# Patient Record
Sex: Female | Born: 1965
Health system: Southern US, Community
[De-identification: ages and names within clinical notes are randomized; demographics above are authoritative.]

## PROBLEM LIST (undated history)

## (undated) DIAGNOSIS — M25512 Pain in left shoulder: Secondary | ICD-10-CM

## (undated) DIAGNOSIS — M1990 Unspecified osteoarthritis, unspecified site: Secondary | ICD-10-CM

## (undated) DIAGNOSIS — E079 Disorder of thyroid, unspecified: Secondary | ICD-10-CM

## (undated) DIAGNOSIS — G4730 Sleep apnea, unspecified: Secondary | ICD-10-CM

## (undated) DIAGNOSIS — J449 Chronic obstructive pulmonary disease, unspecified: Secondary | ICD-10-CM

## (undated) DIAGNOSIS — I10 Essential (primary) hypertension: Secondary | ICD-10-CM

## (undated) DIAGNOSIS — K649 Unspecified hemorrhoids: Secondary | ICD-10-CM

## (undated) DIAGNOSIS — E039 Hypothyroidism, unspecified: Secondary | ICD-10-CM

## (undated) DIAGNOSIS — M25511 Pain in right shoulder: Secondary | ICD-10-CM

## (undated) DIAGNOSIS — Z780 Asymptomatic menopausal state: Secondary | ICD-10-CM

## (undated) DIAGNOSIS — N62 Hypertrophy of breast: Secondary | ICD-10-CM

## (undated) DIAGNOSIS — M549 Dorsalgia, unspecified: Secondary | ICD-10-CM

## (undated) DIAGNOSIS — R195 Other fecal abnormalities: Secondary | ICD-10-CM

## (undated) HISTORY — DX: Dorsalgia, unspecified: M54.9

## (undated) HISTORY — DX: Other fecal abnormalities: R19.5

## (undated) HISTORY — PX: NASAL SINUS SURGERY: SHX719

## (undated) HISTORY — DX: Asymptomatic menopausal state: Z78.0

## (undated) HISTORY — DX: Pain in right shoulder: M25.511

## (undated) HISTORY — PX: ROTATOR CUFF REPAIR: SHX139

## (undated) HISTORY — PX: JOINT REPLACEMENT: SHX530

## (undated) HISTORY — DX: Pain in left shoulder: M25.512

## (undated) HISTORY — DX: Hypertrophy of breast: N62

## (undated) HISTORY — DX: Unspecified hemorrhoids: K64.9

---

## 2001-01-21 ENCOUNTER — Encounter: Payer: Self-pay | Admitting: Otolaryngology

## 2001-01-21 ENCOUNTER — Ambulatory Visit (HOSPITAL_COMMUNITY): Admission: RE | Admit: 2001-01-21 | Discharge: 2001-01-21 | Payer: Self-pay | Admitting: Otolaryngology

## 2001-03-01 ENCOUNTER — Encounter: Payer: Self-pay | Admitting: Otolaryngology

## 2001-03-01 ENCOUNTER — Ambulatory Visit (HOSPITAL_COMMUNITY): Admission: RE | Admit: 2001-03-01 | Discharge: 2001-03-01 | Payer: Self-pay | Admitting: Family Medicine

## 2001-04-12 ENCOUNTER — Encounter (INDEPENDENT_AMBULATORY_CARE_PROVIDER_SITE_OTHER): Payer: Self-pay | Admitting: *Deleted

## 2001-04-12 ENCOUNTER — Ambulatory Visit (HOSPITAL_BASED_OUTPATIENT_CLINIC_OR_DEPARTMENT_OTHER): Admission: RE | Admit: 2001-04-12 | Discharge: 2001-04-12 | Payer: Self-pay | Admitting: Otolaryngology

## 2005-04-27 ENCOUNTER — Emergency Department (HOSPITAL_COMMUNITY): Admission: EM | Admit: 2005-04-27 | Discharge: 2005-04-27 | Payer: Self-pay | Admitting: Emergency Medicine

## 2005-07-16 ENCOUNTER — Emergency Department (HOSPITAL_COMMUNITY): Admission: EM | Admit: 2005-07-16 | Discharge: 2005-07-16 | Payer: Self-pay | Admitting: Emergency Medicine

## 2005-11-06 ENCOUNTER — Emergency Department (HOSPITAL_COMMUNITY): Admission: EM | Admit: 2005-11-06 | Discharge: 2005-11-06 | Payer: Self-pay | Admitting: Emergency Medicine

## 2005-12-16 ENCOUNTER — Emergency Department (HOSPITAL_COMMUNITY): Admission: EM | Admit: 2005-12-16 | Discharge: 2005-12-16 | Payer: Self-pay | Admitting: Emergency Medicine

## 2006-01-16 ENCOUNTER — Ambulatory Visit (HOSPITAL_COMMUNITY): Admission: RE | Admit: 2006-01-16 | Discharge: 2006-01-16 | Payer: Self-pay | Admitting: Family Medicine

## 2006-04-01 ENCOUNTER — Ambulatory Visit (HOSPITAL_COMMUNITY): Admission: RE | Admit: 2006-04-01 | Discharge: 2006-04-01 | Payer: Self-pay | Admitting: Family Medicine

## 2006-04-06 ENCOUNTER — Ambulatory Visit (HOSPITAL_COMMUNITY): Admission: RE | Admit: 2006-04-06 | Discharge: 2006-04-06 | Payer: Self-pay | Admitting: Family Medicine

## 2006-05-12 ENCOUNTER — Emergency Department (HOSPITAL_COMMUNITY): Admission: EM | Admit: 2006-05-12 | Discharge: 2006-05-12 | Payer: Self-pay | Admitting: Emergency Medicine

## 2006-07-14 ENCOUNTER — Emergency Department (HOSPITAL_COMMUNITY): Admission: EM | Admit: 2006-07-14 | Discharge: 2006-07-14 | Payer: Self-pay | Admitting: Emergency Medicine

## 2006-10-16 ENCOUNTER — Encounter (HOSPITAL_COMMUNITY): Admission: RE | Admit: 2006-10-16 | Discharge: 2006-11-15 | Payer: Self-pay | Admitting: Family Medicine

## 2006-12-17 ENCOUNTER — Ambulatory Visit: Payer: Self-pay | Admitting: Gastroenterology

## 2006-12-22 ENCOUNTER — Ambulatory Visit: Payer: Self-pay | Admitting: Gastroenterology

## 2006-12-22 ENCOUNTER — Ambulatory Visit (HOSPITAL_COMMUNITY): Admission: RE | Admit: 2006-12-22 | Discharge: 2006-12-22 | Payer: Self-pay | Admitting: Gastroenterology

## 2006-12-30 ENCOUNTER — Ambulatory Visit (HOSPITAL_COMMUNITY): Admission: RE | Admit: 2006-12-30 | Discharge: 2006-12-30 | Payer: Self-pay | Admitting: *Deleted

## 2007-01-05 ENCOUNTER — Emergency Department (HOSPITAL_COMMUNITY): Admission: EM | Admit: 2007-01-05 | Discharge: 2007-01-05 | Payer: Self-pay | Admitting: Emergency Medicine

## 2007-01-30 ENCOUNTER — Emergency Department (HOSPITAL_COMMUNITY): Admission: EM | Admit: 2007-01-30 | Discharge: 2007-01-30 | Payer: Self-pay | Admitting: Emergency Medicine

## 2007-03-03 ENCOUNTER — Emergency Department (HOSPITAL_COMMUNITY): Admission: EM | Admit: 2007-03-03 | Discharge: 2007-03-03 | Payer: Self-pay | Admitting: Emergency Medicine

## 2007-03-04 ENCOUNTER — Ambulatory Visit: Payer: Self-pay | Admitting: Gastroenterology

## 2007-03-16 ENCOUNTER — Ambulatory Visit (HOSPITAL_COMMUNITY): Admission: RE | Admit: 2007-03-16 | Discharge: 2007-03-16 | Payer: Self-pay | Admitting: *Deleted

## 2007-06-19 ENCOUNTER — Emergency Department (HOSPITAL_COMMUNITY): Admission: EM | Admit: 2007-06-19 | Discharge: 2007-06-19 | Payer: Self-pay | Admitting: Emergency Medicine

## 2007-07-12 ENCOUNTER — Emergency Department (HOSPITAL_COMMUNITY): Admission: EM | Admit: 2007-07-12 | Discharge: 2007-07-12 | Payer: Self-pay | Admitting: Emergency Medicine

## 2007-09-19 ENCOUNTER — Emergency Department (HOSPITAL_COMMUNITY): Admission: EM | Admit: 2007-09-19 | Discharge: 2007-09-19 | Payer: Self-pay | Admitting: Emergency Medicine

## 2007-10-08 ENCOUNTER — Ambulatory Visit: Payer: Self-pay | Admitting: Gastroenterology

## 2008-06-06 ENCOUNTER — Inpatient Hospital Stay (HOSPITAL_COMMUNITY): Admission: EM | Admit: 2008-06-06 | Discharge: 2008-06-08 | Payer: Self-pay | Admitting: Emergency Medicine

## 2008-06-09 ENCOUNTER — Ambulatory Visit (HOSPITAL_COMMUNITY): Admission: RE | Admit: 2008-06-09 | Discharge: 2008-06-09 | Payer: Self-pay | Admitting: Family Medicine

## 2008-07-06 ENCOUNTER — Ambulatory Visit (HOSPITAL_COMMUNITY): Admission: RE | Admit: 2008-07-06 | Discharge: 2008-07-06 | Payer: Self-pay | Admitting: Pulmonary Disease

## 2008-07-17 ENCOUNTER — Ambulatory Visit (HOSPITAL_COMMUNITY): Admission: RE | Admit: 2008-07-17 | Discharge: 2008-07-17 | Payer: Self-pay | Admitting: Family Medicine

## 2008-09-20 ENCOUNTER — Ambulatory Visit (HOSPITAL_COMMUNITY): Admission: RE | Admit: 2008-09-20 | Discharge: 2008-09-20 | Payer: Self-pay | Admitting: Family Medicine

## 2008-11-08 ENCOUNTER — Ambulatory Visit (HOSPITAL_COMMUNITY): Admission: RE | Admit: 2008-11-08 | Discharge: 2008-11-08 | Payer: Self-pay | Admitting: Family Medicine

## 2009-08-14 ENCOUNTER — Emergency Department (HOSPITAL_COMMUNITY): Admission: EM | Admit: 2009-08-14 | Discharge: 2009-08-14 | Payer: Self-pay | Admitting: Emergency Medicine

## 2009-08-19 ENCOUNTER — Emergency Department (HOSPITAL_COMMUNITY): Admission: EM | Admit: 2009-08-19 | Discharge: 2009-08-19 | Payer: Self-pay | Admitting: Emergency Medicine

## 2009-12-22 ENCOUNTER — Emergency Department (HOSPITAL_COMMUNITY): Admission: EM | Admit: 2009-12-22 | Discharge: 2009-12-22 | Payer: Self-pay | Admitting: Emergency Medicine

## 2010-07-02 ENCOUNTER — Emergency Department (HOSPITAL_COMMUNITY): Admission: EM | Admit: 2010-07-02 | Discharge: 2010-07-02 | Payer: Self-pay | Admitting: Emergency Medicine

## 2010-10-02 ENCOUNTER — Emergency Department (HOSPITAL_COMMUNITY)
Admission: EM | Admit: 2010-10-02 | Discharge: 2010-10-02 | Payer: Self-pay | Source: Home / Self Care | Admitting: Emergency Medicine

## 2010-10-13 ENCOUNTER — Encounter: Payer: Self-pay | Admitting: Family Medicine

## 2010-12-04 LAB — DIFFERENTIAL
Basophils Absolute: 0.1 10*3/uL (ref 0.0–0.1)
Basophils Relative: 1 % (ref 0–1)
Eosinophils Absolute: 0.8 10*3/uL — ABNORMAL HIGH (ref 0.0–0.7)
Monocytes Relative: 8 % (ref 3–12)
Neutro Abs: 6.6 10*3/uL (ref 1.7–7.7)
Neutrophils Relative %: 62 % (ref 43–77)

## 2010-12-04 LAB — BASIC METABOLIC PANEL
BUN: 4 mg/dL — ABNORMAL LOW (ref 6–23)
CO2: 28 mEq/L (ref 19–32)
Calcium: 8.7 mg/dL (ref 8.4–10.5)
Creatinine, Ser: 0.63 mg/dL (ref 0.4–1.2)
GFR calc non Af Amer: >60 mL/min (ref >60)
Glucose, Bld: 114 mg/dL — ABNORMAL HIGH (ref 70–99)
Sodium: 139 mEq/L (ref 135–145)

## 2010-12-04 LAB — CBC
Hemoglobin: 13.2 g/dL (ref 12.0–15.0)
MCH: 29.9 pg (ref 26.0–34.0)
MCHC: 34.4 g/dL (ref 30.0–36.0)
RDW: 14 % (ref 11.5–15.5)

## 2010-12-04 LAB — POCT CARDIAC MARKERS: Myoglobin, poc: 65.4 ng/mL (ref 12–200)

## 2010-12-25 LAB — BASIC METABOLIC PANEL
BUN: 9 mg/dL (ref 6–23)
CO2: 27 mEq/L (ref 19–32)
Glucose, Bld: 105 mg/dL — ABNORMAL HIGH (ref 70–99)
Potassium: 3.6 mEq/L (ref 3.5–5.1)
Sodium: 135 mEq/L (ref 135–145)

## 2010-12-25 LAB — DIFFERENTIAL
Basophils Absolute: 0.3 10*3/uL — ABNORMAL HIGH (ref 0.0–0.1)
Basophils Relative: 2 % — ABNORMAL HIGH (ref 0–1)
Eosinophils Absolute: 1.4 10*3/uL — ABNORMAL HIGH (ref 0.0–0.7)
Eosinophils Relative: 11 % — ABNORMAL HIGH (ref 0–5)
Monocytes Absolute: 1 10*3/uL (ref 0.1–1.0)

## 2010-12-25 LAB — CBC
HCT: 41.3 % (ref 36.0–46.0)
Hemoglobin: 14.4 g/dL (ref 12.0–15.0)
MCHC: 34.8 g/dL (ref 30.0–36.0)
Platelets: 222 10*3/uL (ref 150–400)
RDW: 13.6 % (ref 11.5–15.5)

## 2011-02-04 NOTE — Procedures (Signed)
[name], [name]                  ACCOUNT NO.:  [id number]   MEDICAL RECORD NO.:  [id number]          PATIENT TYPE:  OUT   LOCATION:  RESP                          FACILITY:  [facility]   PHYSICIAN:  Edward L. Juanetta Gosling, M.D.DATE OF BIRTH:  [birth year]   DATE OF PROCEDURE:  DATE OF DISCHARGE:                            PULMONARY FUNCTION TEST   1. Spirometry shows no ventilatory defect, but does show air flow      obstruction most marked in the smaller airways.  2. Lung volumes are normal.  3. DLCO is borderline reduced.  4. Arterial blood gases are normal.      Edward L. Juanetta Gosling, M.D.  Electronically Signed     ELH/MEDQ  D:  07/06/2008  T:  07/07/2008  Job:  [id number]

## 2011-02-04 NOTE — Discharge Summary (Signed)
NAMEBINTOU, Connie NO.:  [id number]   MEDICAL RECORD NO.:  [id number]          PATIENT TYPE:  INP   LOCATION:  A307                          FACILITY:  [facility]   PHYSICIAN:  Dorris Singh, DO    DATE OF BIRTH:  22-Apr-1966   DATE OF ADMISSION:  06/06/2008  DATE OF DISCHARGE:  09/17/2009LH                               DISCHARGE SUMMARY   ADMISSION DIAGNOSES:  Community acquired pneumonia, acute bronchitis,  nonspecific abdominal pain of unclear etiology, stridor.   DISCHARGE DIAGNOSES:  Acute bronchitis, acute community acquired  pneumonia, resolved abdominal pain and low back pain, hyperglycemia  secondary to steroid use as well as leukocytosis secondary to steroid  use.   PRIMARY CARE PHYSICIAN:  Dr. Nobie Putnam.   TESTING DONE:  Includes on September 15 she had a one view chest x-ray  which demonstrated superior right lower lobe airspace opacity which may  represent a pneumonia.  This would better be evaluated on PA and lateral  chest radiographs in full inspiration.  On September 16 she had an  ultrasound done which showed no acute abdomen on this patient with acute  pain and tenderness in right upper quadrant with transient pressure and  no specific gallbladder findings were found.   HOSPITAL COURSE:  Her H&P was done by Dr. Rito Ehrlich.  Please refer for  present illness.  To summarize the patient is a 46 year old woman who  presents with symptoms of wheezing and shortness of breath and vomiting.  She was found to have the above diagnoses.  She was admitted to the  service of [facility] and started on steroids, antibiotics and nebulizer  treatments.  She also had some element of stridor.  She is complaining  of sore throat as initial diagnosis in which she received racemic  epinephrine.  She ended up doing well.  Her CT of the neck was done  which did show her oropharynx did not look abnormal.  Also she was  started on Levaquin and GI and DVT prophylaxis was  instituted.  She also  complained of low back pain and she was just placed on NSAIDs.  Her  acute bronchitis responded well to steroid treatment and on September 17  it was determined that the patient could now discharge to home.   On physical examination her lungs were clear to auscultation.  There  were no coarse breath sounds or wheezing.  Heart was regular rate and  rhythm.  Abdomen soft, nontender, nondistended.  Extremities positive  pulses.  Her labs for today, her vitals are stable and she is afebrile  with a blood pressure of 118/63.  Her white count is 28.1, hemoglobin  12.8, hematocrit 37.0 and platelet count of 228.  Sodium 137, potassium  3.7, chloride 107, CO2 25, glucose 163, BUN 8, creatinine 0.63.  The  patient wanted to go home today.  It was felt she could go home for  outpatient treatment and it was recommended that she follow up with Dr.  Nobie Putnam in the next 1-3 days.  Also she will be put on new medications  which include Levaquin 500 mg one p.o. daily x7 days, Medrol Dosepak use  as directed #1, Xopenex 0.63 nebs b.i.d., Atrovent unit dose nebs one  t.i.d. #30.  The patient has a nebulizer at home.  We will also see if the patient qualifies for home health to come and  evaluate her for a few days if she would like it.  If not, she is to  follow up with her primary care as recommended.  Her condition is stable  and her disposition will be to home.      Dorris Singh, DO  Electronically Signed     CB/MEDQ  D:  06/08/2008  T:  06/08/2008  Job:  [id number]

## 2011-02-04 NOTE — H&P (Signed)
NAMECHERRELLE, [name]                  ACCOUNT NO.:  [id number]   MEDICAL RECORD NO.:  [id number]          PATIENT TYPE:  INP   LOCATION:  A307                          FACILITY:  [facility]   PHYSICIAN:  Osvaldo Shipper, MD     DATE OF BIRTH:  1966/01/04   DATE OF ADMISSION:  06/06/2008  DATE OF DISCHARGE:  LH                              HISTORY & PHYSICAL   PRIMARY MEDICAL DOCTOR:  Patrica Duel, M.D.   ADMISSION DIAGNOSES:  1. Community-acquired pneumonia.  2. Acute bronchitis.  3. Nonspecific abdominal pain, unclear etiology.   CHIEF COMPLAINT:  Shortness of breath for the past 2 days.   HISTORY OF PRESENT ILLNESS:  The patient is a 45 year old Caucasian  female who mentioned that she developed sore throat-like symptoms about  2 days ago, did not feel well yesterday and then started getting short  of breath.  These symptoms worsened over the night and then she has  decided to come to the hospital this morning for further evaluation.  She was also having significant wheezing.  She was having a dry cough.  She threw up when she came up on the floor, which was clear emesis.  She  denies any sick contacts or any recent travel outside this area.  She  mentioned chest pain which increases with deep breathing and cough and  is sharp in nature.  This also has been ongoing since yesterday.  She  denies any difficulty swallowing.  She does also have lower back pain  which has been ongoing for the past 3 days.  Denies any trauma or any  other injury.  She has been able to walk without any difficulties.  She  also has these nonspecific abdominal symptoms, especially in the upper  abdomen, which have been ongoing since yesterday as well.  The patient  also mentioned a fever of 101 at home.   She mentioned that ever since she has been in the ED and received  treatment, she is feeling slightly better but not significantly.   She denies any dysuria or hematuria.  No vaginal discharge.  Denies any  diarrhea.   MEDICATIONS AT HOME:  She mentions she is on albuterol inhaler on an as-  needed basis.  Otherwise, she does not take any other medications.  Over-  the-counter she does take some sinus pills.   ALLERGIES:  PENICILLIN, which causes apparently a yeast infection.   PAST MEDICAL HISTORY:  Positive for sinus surgery.  She has a history of  asthma.  Otherwise quite unremarkable.   SOCIAL HISTORY:  She lives in [place].  Works in a [employer] and  is exposed to dust and silica.  No smoking use ever.  Occasional alcohol  use but has not had anything to drink in 3 months.  Denies any illicit  drug use.  Independent with daily activities.   FAMILY HISTORY:  Father died of a brain tumor at a young age.  Mother  has hypertension and dementia.  She has 4 siblings.  They have high  blood pressure and diabetes.  REVIEW OF SYSTEMS:  GENERAL:  Positive for weakness, malaise.  HEENT:  Unremarkable.  CARDIOVASCULAR:  Unremarkable.  RESPIRATORY:  As in the  HPI.  GI:  As in the HPI.  GU:  Unremarkable.  MUSCULOSKELETAL:  Positive for back pain in the lower area as discussed earlier.  NEUROLOGIC:  Unremarkable.  PSYCHIATRIC:  Unremarkable.  DERMATOLOGIC:  Unremarkable.  Other systems unremarkable.   PHYSICAL EXAMINATION:  VITAL SIGNS:  When she presented to the ED she  had a temperature of 98.6, blood pressure was 164/95, heart rate 118,  respiratory rate 28, saturation 88% on room air.  Saturation improved  with 2 L of O2 to 100%.  She has been a little bit tachycardic in the ED  because of her breathing treatments.  Here on the floor her blood  pressure is 134/68, she is saturating 94% on 2 L.  GENERAL:  This is an overweight, obese white female in some discomfort  but in no distress.  HEENT:  There is no pallor, no icterus.  Oral mucous membrane is moist.  No oral lesions are noted.  Throat examination did not reveal any  erythema.  Ears:  The right ear was slightly dull, the  tympanic membrane  was slightly dull with poor light reflex.  The left ear was normal.  NECK:  Soft and supple.  No thyromegaly is appreciated.  LUNGS:  Diffuse extensive wheezing bilaterally.  No definite crackles  appreciated.  No dullness to percussion.  CARDIOVASCULAR:  S1, S2, is tachycardic, regular, no murmurs  appreciated.  No S3 or S4.  No rubs, no bruits.  Peripheral pulses are  palpable, no pedal edema.  ABDOMEN:  Diffusely tender but no rebound, rigidity or guarding.  Bowel  sounds are present.  No masses or organomegaly are appreciated.  Tenderness mostly elicited in the epigastric area.  GU:  Examination was deferred.  NEUROLOGIC:  She is alert and oriented x3, no focal neurological  deficits are present.   LABORATORY DATA:  ABG showed a pH of 7.37, pCO2 29, pO2 64, bicarb 17,  saturation 92%.  This was done on 2 L.  CBC shows a slight left shift  with a normal WBC count.  Her potassium is 3.3, glucose 158, BUN is 5.  Blood cultures have been sent, too early for results.  Chest x-ray shows  likely pneumonia in the right lower lobe.   ASSESSMENT:  This is a 45 year old Caucasian female with a past medical  history of asthma, who presents with a 2-day history of shortness of  breath and wheezing.  She likely has acute bronchitis, which will be  treated as discussed below.  She also has this nonspecific abdominal  pain, etiology of which is unclear.  She does have a history of  gastroesophageal reflux disease so that is one possibility.  She has had  a normal esophagogastroduodenoscopy a couple of years ago.   PLAN:  1. Acute bronchitis.  Treat with nebulizer treatments, steroids,      antibiotics.  She also has some element for stridor so I will give      her racemic epinephrine and see how she responds to it.  If she      does not respond or if she gets worse, we may have to consider      getting a CT of her neck done.  Her oropharynx did not look grossly      abnormal  on examination.  2. Community-acquired pneumonia.  Treat  with Levaquin.  Blood cultures      are pending.  3. Abdominal pain, etiology unclear.  LFTs are normal.  We will check      amylase, lipase.  We will order an ultrasound of the abdomen to      further evaluate this pain.  PPI will be initiated and we will give      her 1 dose of Dilaudid.  4. Low back pain.  No neurological deficits.  No history of trauma.      We will monitor this and if the pain worsens      or does not resolve, we will have to obtain imaging studies in the      form of x-ray.  5. DVT and GI prophylaxis will be initiated.   Further management decisions will depend on the results of further  testing and patient's response to treatment.      Osvaldo Shipper, MD  Electronically Signed     GK/MEDQ  D:  06/06/2008  T:  06/06/2008  Job:  [id number]   cc:   Patrica Duel, M.D.  Fax: [phone]

## 2011-02-04 NOTE — Group Therapy Note (Signed)
[name], RZASA NO.:  [id number]   MEDICAL RECORD NO.:  [id number]          PATIENT TYPE:  INP   LOCATION:  A307                          FACILITY:  [facility]   PHYSICIAN:  Dorris Singh, DO    DATE OF BIRTH:  [birth year]   DATE OF PROCEDURE:  DATE OF DISCHARGE:                                 PROGRESS NOTE   Patient seen today with partner stating that she feels okay.  She  currently is having shortness of breath.  I reviewed her ultrasound  which was negative and also blood cultures which were negative today.   PHYSICAL EXAMINATION:  VITAL SIGNS:  GENERAL:  The patient is a 45 year old Caucasian female who is well-  developed and well-nourished in no acute distress.  HEART:  Regular rate and rhythm.  Lungs:  Inspiratory and expiratory wheezing with decreased breath sounds  at the bases.  ABDOMEN:  Soft, nontender, nondistended.  EXTREMITIES:  Positive pulses.  No edema, ecchymosis or cyanosis.   LABORATORY DATA:  For today, white count is 15.7, which is increased.  Hemoglobin 12.7, hematocrit 35.9, platelet count 219.  Sodium is 138,  potassium 3.0, chloride 111, CO2 of 21, glucose 167, BUN 4, creatinine  is 0.60.   ASSESSMENT/PLAN:  1. Acute bronchitis.  Continue with nebulizer treatments, steroids and      antibiotics.  She has been given racemic epinephrine on this      admission.  Continue to monitor her.  2. Community-acquired pneumonia.  Will continue with Levaquin.  3. Abdominal pain.  Her ultrasound is negative, but she has been      started on PPI.  Will continue with that.  4. Low back pain.  The patient states that her menses started today.      This may be the etiology for that.   We will continue to monitor her.  Plan on discharge planning anywhere  from 1-2 days.      Dorris Singh, DO  Electronically Signed     CB/MEDQ  D:  06/07/2008  T:  06/07/2008  Job:  [id number]

## 2011-02-04 NOTE — Assessment & Plan Note (Signed)
NAMEKAMEREN, BAADE                   CHART#:  [id number]   DATE:  03/04/2007                       DOB:  [birth year]   REFERRING PHYSICIAN:  Patrica Duel.   PROBLEM LIST:  1. Gastroesophageal reflux disease.  2. Shortness of breath of uncertain etiology.  3. Significant exercise intolerance.   SUBJECTIVE:  Ms. Rupert is a 45 year old female who presented as a return  patient visit.  She was last seen on December 22, 2006 for an EGD.  Her EGD  was normal.  She was asked to begin Nexium and to lose 5 pounds.  She  was also asked to follow up at the [facility] gastroesophageal reflux  disease self-care handout, and begin a low-fat diet.  Ms. Kammerer has  since discontinued her Nexium, but continues to follow the GERD  recommendations, as well as a low-fat diet.  She states she has  heartburn less than once per week.  She describes it as mild in  severity.  She is also glad that the [employer] won the [employer].  She is currently undergoing a workup for her exercise  intolerance and shortness of breath.   MEDICATIONS:  None.   OBJECTIVE:  VITAL SIGNS:  Weight 213-1/2 pounds.  Height 5 feet 3-1/2  inches.  Temperature 98.4.  Blood pressure 142/98.  Pulse 88.  GENERAL:  She is in no apparent distress, alert and oriented times  4.LUNGS:  Clear to auscultation bilaterally.CARDIOVASCULAR EXAM:  Regular rhythm.  ABDOMEN:  Bowel sounds present, soft, nontender, nondistended, obese.   ASSESSMENT:  Ms. Bowring is a 45 year old female with gastroesophageal  reflux disease which is mild.  Her symptoms are controlled with self-  care recommendations and a low-fat diet.  She stopped her proton pump  inhibitor and is not interested in taking medicines at this point.   Thank you for allowing me to see Ms. Keidel in consultation.  My  recommendations follow.   RECOMMENDATIONS:  1. She may return to see me in 6 months.  2. She should continue to follow the [facility] GERD self-care  handout.  3. She should also follow a low-fat diet.  4. She may call, and I would be more than happy to restart her proton      pump inhibitor if her symptoms become more severe.       Kassie Mends, M.D.  Electronically Signed     SM/MEDQ  D:  03/04/2007  T:  03/05/2007  Job:  [id number]   cc:   Patrica Duel, M.D.

## 2011-02-07 NOTE — Assessment & Plan Note (Signed)
NAMEROBBYE, DEDE                   CHART#:  [id number]   DATE:  10/07/2006                       DOB:  April 13, 1966   REFERRING PHYSICIAN:  Patrica Duel, M.D.   PROBLEM LIST:  1. Gastroesophageal reflux disease.  2. Shortness of breath.  3. Exercise intolerance.   SUBJECTIVE:  Ms. Glace is a 45 year old female who presented as a return  patient visit.  She is no longer having problems with reflux.  Her  symptoms were controlled in June with self care recommendations and a  low fat diet.  At that point she was not interested in taking any  medicines.   OBJECTIVE:  VITAL SIGNS:  Weight 202 pounds (down 15 pounds since June  2008), height 5 feet 3.5 inches, temperature 98, blood pressure 122/88,  pulse 68.GENERAL:  She is no apparent distress, alert and oriented  x4.LUNGS:  Clear to auscultation bilaterally.CARDIOVASCULAR:  Regular  rhythm.  ABDOMEN:  Bowel sounds present.  Soft, nontender, nondistended.  Obese.   ASSESSMENT:  Ms. Gibbon is a 45 year old female who is doing fairly well  off of proton pump inhibitor therapy.   Thank you for allowing me to see Ms. Borneman in consultation.  My  recommendations follow.   RECOMMENDATIONS:  1. She may return to see me as needed.  2. She should continue to follow the [facility] GERD self care      handout.  3. She should also continue to follow a low fat diet.       Kassie Mends, M.D.  Electronically Signed    SM/MEDQ  D:  10/08/2007  T:  10/08/2007  Job:  [id number]   cc:   Patrica Duel, M.D.

## 2011-02-07 NOTE — Procedures (Signed)
[name], [name]                  ACCOUNT NO.:  [id number]   MEDICAL RECORD NO.:  [id number]          PATIENT TYPE:  OUT   LOCATION:  RESP                          FACILITY:  [facility]   PHYSICIAN:  Edward L. Juanetta Gosling, M.D.DATE OF BIRTH:  [birth year]   DATE OF PROCEDURE:  DATE OF DISCHARGE:  04/06/2006                              PULMONARY FUNCTION TEST   IMPRESSION:  1.  Spirometry shows a mild ventilatory defect with evidence of airflow      obstruction.  2.  Lung volumes are normal.  3.  DLCO is mildly reduced.  4.  Arterial blood gases are normal.  5.  There is no significant bronchodilator effect.      Edward L. Juanetta Gosling, M.D.  Electronically Signed     ELH/MEDQ  D:  04/08/2006  T:  04/09/2006  Job:  [id number]   cc:   Kirk Ruths, M.D.  Fax: [phone]

## 2011-02-07 NOTE — Consult Note (Signed)
[name], [name]                  ACCOUNT NO.:  [id number]   MEDICAL RECORD NO.:  [id number]          PATIENT TYPE:  AMB   LOCATION:                                FACILITY:  [facility]   PHYSICIAN:  Kassie Mends, M.D.      DATE OF BIRTH:  [birth year]   DATE OF CONSULTATION:  DATE OF DISCHARGE:                                 CONSULTATION   REQUESTING PHYSICIAN:  Dr. Nobie Putnam   CHIEF COMPLAINT:  Refractory GERD.   HISTORY OF PRESENT ILLNESS:  [name] is a 45 year old female with a  longstanding history of chronic GERD.  She complains of frequent  belching, regurgitation, water brash, indigestion, heartburn, chest pain  and a metallic taste in her mouth.  Her symptoms have been worse over  the last couple of months.  She started on Nexium 40 mg daily about 2  weeks ago.  She says that she has not noticed any change with the  Nexium.  She does have some mild epigastric pain with the symptoms, has  had some transient nausea and has vomited on two to three occasions as  well.  Denies any association with certain foods or movement.  Denies  any dysphagia, odynophagia.  She generally has two soft brown bowel  movements a day.  Denies any rectal bleeding or melena.  She was found  to be H. pylori positive through serology and has received treatment  through [facility].  She did have a CT scan of the abdomen and pelvis  without contrast on August 24, 2006.  She was found to have a  follicular cyst in the right ovary.  She had blood work done in  [place] which showed a normal CBC, normal CMP, amylase and lipase.  She tells me she had a HIDA scan at [facility] which was  negative.  HIDA scan was done on June25,2008.  She is scheduled to see a  cardiologist next week as well.   PAST MEDICAL HISTORY:  1. Depression.  2. Hypertension.  3. GERD.  4. She has had sinus surgery.   CURRENT MEDICATIONS:  1. Lexapro 10 mg daily.  2. Nexium 40 mg daily.   ALLERGIES:  PENICILLIN.   FAMILY HISTORY:  No known family history of colorectal carcinoma or  other chronic GI problems.   SOCIAL HISTORY:  She is single.  She is employed with [employer].  She  denies any tobacco or drug use.  She does drink about one alcoholic  beverage a month.   REVIEW OF SYSTEMS:  CONSTITUTIONAL:  Her weight has increased about 13  pounds in the last 2 months.  Denies any fever or chills, fatigue.  CARDIOVASCULAR:  See HPI.  Denies any palpitations.  PULMONARY:  She  does have chronic cough with exertion which is nonproductive.  She  occasionally uses inhalers but does not have a formal diagnosis of  asthma.  Denies any hemoptysis.  GI:  See HPI.   PHYSICAL EXAMINATION:  VITAL SIGNS:  Weight 212 pounds, height 63-and-a-  half inches, temperature 98.4, blood pressure 128/88,  and pulse 88.  GENERAL:  [name] is an obese Caucasian female who is alert, oriented,  pleasant, and cooperative in no acute distress.  HEENT:  Sclerae clear, nonicteric.  Conjunctivae pink.  Oropharynx pink  and moist without lesions.  NECK:  Supple without mass or thyromegaly.  CHEST:  Hear regular rate and rhythm.  Normal S1, S2, without murmurs,  clicks, rubs, or gallops.  Lungs clear to auscultation bilaterally.  ABDOMEN:  Positive bowel sounds x4.  No bruits auscultated.  Soft,  nontender, nondistended, without palpable mass or hepatosplenomegaly.  No rebound, tenderness or guarding.  Negative Murphy sign.  EXTREMITIES:  Without clubbing or edema bilaterally.  SKIN:  Pink, warm and dry without any rash or jaundice.   IMPRESSION:  [name] is a 45 year old female with a history of chronic  gastroesophageal reflux disease.  More recently she has had frequent  indigestion along with heartburn, chest pain, increased belching,  regurgitation, and water brash.  She was recently found to be H. pylori  positive, is status post treatment.  She is going to need further  evaluation to rule out complicated  gastroesophageal reflux disease  including erosive reflux esophagitis/gastritis.  She has had a normal  HIDA scan recently as well, ruling out biliary dyskinesia, but it does  not appear she has had any further imaging of her gallbladder via  ultrasound.  She did have a noncontrast CT back in December.   PLAN:  1. A work note for today.  2. Continue Nexium 40 mg daily.  3. EGD with Dr. Cira Servant as soon as possible.  I have discussed the      procedure including risks and benefits to include but not limited      to bleeding, infection, perforation, drug reaction.  She agrees      with the plan and consent will be obtained.   We would like to thank Dr. Nobie Putnam for allowing [place] to participate in  the care of [name].      Nicholas Lose, N.P.      Kassie Mends, M.D.  Electronically Signed    KC/MEDQ  D:  12/17/2006  T:  12/17/2006  Job:  [id number]   cc:   Patrica Duel, M.D.  Fax: [phone]

## 2011-02-07 NOTE — Op Note (Signed)
[name], [name]                  ACCOUNT NO.:  [id number]   MEDICAL RECORD NO.:  [id number]          PATIENT TYPE:  AMB   LOCATION:  DAY                           FACILITY:  [facility]   PHYSICIAN:  Kassie Mends, M.D.      DATE OF BIRTH:  16-Feb-1966   DATE OF PROCEDURE:  12/22/2006  DATE OF DISCHARGE:                               OPERATIVE REPORT   PROCEDURE:  Esophagogastroduodenoscopy.   ENDOSCOPIST:  Kassie Mends, M.D.   INDICATIONS FOR PROCEDURE:  [name] is a 45 year old female who  presents with uncontrolled reflux.  She has a history of reflux in the  past, which she treated with Tums.  She has also gained 20 to 25 pounds  in the last six to 12 months.   FINDINGS:  1. Normal esophagus, without evidence of Barrett's, erosions or      ulcerations.  2. Normal stomach and duodenal bulb and second portion of the      duodenum.   RECOMMENDATIONS:  1. Increase Nexium to 40 mg b.i.d., 30 minutes before the first meal      and the last meal, for the next four weeks, then decrease to Nexium      40 mg daily, 30 minutes before the first meal.  2. Recommend losing 5 pounds in the next six weeks.  3. Follow at the [facility] gastroesophageal reflux disease self-care      recommendations.  4. Low-fat diet.  5. [name] is given a handout on the [facility] gastroesophageal      reflux disease self-care recommendations, as well as a low-fat diet      handout.  She may call me tomorrow if she has any questions in      regards to these recommendations.  6. Follow up with Dr. Kassie Mends in six weeks, in regards to her      gastroesophageal reflux disease.   MEDICATIONS:  1. Demerol 100 mg IV.  2. Versed 6 mg IV.   DESCRIPTION OF PROCEDURE:  A physical examination was performed and an  informed consent was obtained from the patient, after explaining the  benefits, risks and alternatives of the procedure.  The patient was  connected to the monitor and placed in the left lateral  position.  Continuous oxygen was provided by nasal cannula and IV medicine  administered through an indwelling cannula.  After the administration of  sedation, the patient's esophagus was intubated and the scope was  advanced under direct visualization to the second portion of the  duodenum.  The  scope was subsequently removed slowly, by carefully examining the color,  texture, anatomy and integrity of the mucosa on the way out. The patient  was recovered in the endoscopy suite and discharged home in satisfactory  condition.      Kassie Mends, M.D.  Electronically Signed     SM/MEDQ  D:  12/22/2006  T:  12/22/2006  Job:  [id number]   cc:   Patrica Duel, M.D.  Fax: [phone]

## 2011-04-11 ENCOUNTER — Other Ambulatory Visit (HOSPITAL_COMMUNITY): Payer: Self-pay | Admitting: Family Medicine

## 2011-04-11 DIAGNOSIS — Z139 Encounter for screening, unspecified: Secondary | ICD-10-CM

## 2011-04-15 ENCOUNTER — Ambulatory Visit (HOSPITAL_COMMUNITY)
Admission: RE | Admit: 2011-04-15 | Discharge: 2011-04-15 | Disposition: A | Discharge status: Home / Self Care | Payer: BC Managed Care – PPO | Source: Ambulatory Visit | Attending: Family Medicine | Admitting: Family Medicine

## 2011-04-15 DIAGNOSIS — Z139 Encounter for screening, unspecified: Secondary | ICD-10-CM

## 2011-04-15 DIAGNOSIS — Z1231 Encounter for screening mammogram for malignant neoplasm of breast: Secondary | ICD-10-CM | POA: Insufficient documentation

## 2011-04-16 ENCOUNTER — Other Ambulatory Visit (HOSPITAL_COMMUNITY): Payer: Self-pay | Admitting: Family Medicine

## 2011-04-16 DIAGNOSIS — R928 Other abnormal and inconclusive findings on diagnostic imaging of breast: Secondary | ICD-10-CM

## 2011-04-17 ENCOUNTER — Ambulatory Visit (HOSPITAL_COMMUNITY): Payer: BC Managed Care – PPO

## 2011-04-21 ENCOUNTER — Other Ambulatory Visit (HOSPITAL_COMMUNITY): Payer: Self-pay | Admitting: Family Medicine

## 2011-04-21 DIAGNOSIS — R928 Other abnormal and inconclusive findings on diagnostic imaging of breast: Secondary | ICD-10-CM

## 2011-04-25 ENCOUNTER — Ambulatory Visit
Admission: RE | Admit: 2011-04-25 | Discharge: 2011-04-25 | Disposition: A | Discharge status: Home / Self Care | Payer: BC Managed Care – PPO | Source: Ambulatory Visit | Attending: Family Medicine | Admitting: Family Medicine

## 2011-04-25 ENCOUNTER — Other Ambulatory Visit (HOSPITAL_COMMUNITY): Payer: Self-pay | Admitting: Family Medicine

## 2011-04-25 ENCOUNTER — Other Ambulatory Visit: Payer: BC Managed Care – PPO

## 2011-04-25 DIAGNOSIS — R928 Other abnormal and inconclusive findings on diagnostic imaging of breast: Secondary | ICD-10-CM

## 2011-04-30 ENCOUNTER — Ambulatory Visit
Admission: RE | Admit: 2011-04-30 | Discharge: 2011-04-30 | Disposition: A | Discharge status: Home / Self Care | Payer: BC Managed Care – PPO | Source: Ambulatory Visit | Attending: Family Medicine | Admitting: Family Medicine

## 2011-04-30 ENCOUNTER — Other Ambulatory Visit: Payer: Self-pay | Admitting: Radiology

## 2011-04-30 ENCOUNTER — Other Ambulatory Visit (HOSPITAL_COMMUNITY): Payer: Self-pay | Admitting: Family Medicine

## 2011-04-30 DIAGNOSIS — R928 Other abnormal and inconclusive findings on diagnostic imaging of breast: Secondary | ICD-10-CM

## 2011-05-01 ENCOUNTER — Other Ambulatory Visit: Payer: BC Managed Care – PPO

## 2011-05-07 ENCOUNTER — Encounter (HOSPITAL_COMMUNITY): Payer: BC Managed Care – PPO

## 2011-06-10 ENCOUNTER — Other Ambulatory Visit (HOSPITAL_COMMUNITY)
Admission: RE | Admit: 2011-06-10 | Discharge: 2011-06-10 | Disposition: A | Discharge status: Home / Self Care | Payer: BC Managed Care – PPO | Source: Ambulatory Visit | Attending: Obstetrics and Gynecology | Admitting: Obstetrics and Gynecology

## 2011-06-10 ENCOUNTER — Other Ambulatory Visit: Payer: Self-pay | Admitting: Adult Health

## 2011-06-10 DIAGNOSIS — Z01419 Encounter for gynecological examination (general) (routine) without abnormal findings: Secondary | ICD-10-CM | POA: Insufficient documentation

## 2011-06-23 LAB — HEPATIC FUNCTION PANEL
Albumin: 3.3 — ABNORMAL LOW
Indirect Bilirubin: 0.3
Total Protein: 6.5

## 2011-06-23 LAB — BASIC METABOLIC PANEL
BUN: 4 — ABNORMAL LOW
Calcium: 8.6
Creatinine, Ser: 0.6
Creatinine, Ser: 0.63
GFR calc Af Amer: >60
GFR calc non Af Amer: >60
GFR calc non Af Amer: >60
Glucose, Bld: 167 — ABNORMAL HIGH
Potassium: 3 — ABNORMAL LOW

## 2011-06-23 LAB — DIFFERENTIAL
Basophils Absolute: 0
Basophils Relative: 0
Eosinophils Absolute: 0
Eosinophils Absolute: 0.3
Eosinophils Relative: 0
Eosinophils Relative: 4
Lymphocytes Relative: 11 — ABNORMAL LOW
Lymphocytes Relative: 3 — ABNORMAL LOW
Lymphs Abs: 1
Monocytes Absolute: 0.5
Monocytes Relative: 2 — ABNORMAL LOW
Monocytes Relative: 5
Neutro Abs: 20.2 — ABNORMAL HIGH
Neutrophils Relative %: 96 — ABNORMAL HIGH
Neutrophils Relative %: 96 — ABNORMAL HIGH

## 2011-06-23 LAB — CBC
HCT: 35.9 — ABNORMAL LOW
HCT: 39.9
Hemoglobin: 13.9
MCHC: 34.6
MCHC: 34.7
MCV: 87.5
MCV: 87.5
Platelets: 219
RBC: 4.2
RDW: 13.8
RDW: 13.9
WBC: 21.1 — ABNORMAL HIGH

## 2011-06-23 LAB — BLOOD GAS, ARTERIAL
Bicarbonate: 17 — ABNORMAL LOW
Bicarbonate: 25.7 — ABNORMAL HIGH
O2 Content: 2
O2 Saturation: 92.4
Patient temperature: 37
Patient temperature: 37
pH, Arterial: 7.377
pH, Arterial: 7.418 — ABNORMAL HIGH

## 2011-06-23 LAB — CULTURE, BLOOD (ROUTINE X 2)
Culture: NO GROWTH
Report Status: 9202009

## 2011-06-23 LAB — URINALYSIS, ROUTINE W REFLEX MICROSCOPIC
Leukocytes, UA: NEGATIVE
Nitrite: NEGATIVE
Specific Gravity, Urine: <1.005 — ABNORMAL LOW
pH: 5.5

## 2011-06-23 LAB — COMPREHENSIVE METABOLIC PANEL
ALT: 22
AST: 23
Albumin: 3.5
CO2: 25
Calcium: 8.4
Creatinine, Ser: 0.61
GFR calc Af Amer: >60
GFR calc non Af Amer: >60
Sodium: 137

## 2011-06-23 LAB — AMYLASE: Amylase: 32

## 2011-06-23 LAB — URINE MICROSCOPIC-ADD ON

## 2011-07-02 LAB — DIFFERENTIAL
Basophils Absolute: 0.1
Lymphocytes Relative: 25
Neutro Abs: 6
Neutrophils Relative %: 66

## 2011-07-02 LAB — CBC
Hemoglobin: 14.4
Platelets: 205
RDW: 13.3

## 2012-03-22 ENCOUNTER — Other Ambulatory Visit (HOSPITAL_COMMUNITY): Payer: Self-pay | Admitting: Family Medicine

## 2012-03-22 DIAGNOSIS — N63 Unspecified lump in unspecified breast: Secondary | ICD-10-CM

## 2012-03-22 DIAGNOSIS — R928 Other abnormal and inconclusive findings on diagnostic imaging of breast: Secondary | ICD-10-CM

## 2012-04-16 ENCOUNTER — Ambulatory Visit
Admission: RE | Admit: 2012-04-16 | Discharge: 2012-04-16 | Disposition: A | Discharge status: Home / Self Care | Payer: BC Managed Care – PPO | Source: Ambulatory Visit | Attending: Family Medicine | Admitting: Family Medicine

## 2012-04-16 DIAGNOSIS — N63 Unspecified lump in unspecified breast: Secondary | ICD-10-CM

## 2012-06-11 ENCOUNTER — Encounter (HOSPITAL_COMMUNITY): Payer: Self-pay | Admitting: Emergency Medicine

## 2012-06-11 ENCOUNTER — Emergency Department (HOSPITAL_COMMUNITY): Payer: BC Managed Care – PPO

## 2012-06-11 ENCOUNTER — Emergency Department (HOSPITAL_COMMUNITY)
Admission: EM | Admit: 2012-06-11 | Discharge: 2012-06-11 | Disposition: A | Discharge status: Home / Self Care | Payer: BC Managed Care – PPO | Attending: Emergency Medicine | Admitting: Emergency Medicine

## 2012-06-11 DIAGNOSIS — R10811 Right upper quadrant abdominal tenderness: Secondary | ICD-10-CM | POA: Insufficient documentation

## 2012-06-11 DIAGNOSIS — R109 Unspecified abdominal pain: Secondary | ICD-10-CM | POA: Insufficient documentation

## 2012-06-11 DIAGNOSIS — N200 Calculus of kidney: Secondary | ICD-10-CM | POA: Insufficient documentation

## 2012-06-11 DIAGNOSIS — I10 Essential (primary) hypertension: Secondary | ICD-10-CM | POA: Insufficient documentation

## 2012-06-11 DIAGNOSIS — M549 Dorsalgia, unspecified: Secondary | ICD-10-CM | POA: Insufficient documentation

## 2012-06-11 HISTORY — DX: Chronic obstructive pulmonary disease, unspecified: J44.9

## 2012-06-11 HISTORY — DX: Essential (primary) hypertension: I10

## 2012-06-11 LAB — COMPREHENSIVE METABOLIC PANEL
ALT: 19 U/L (ref 0–35)
AST: 20 U/L (ref 0–37)
Albumin: 3.5 g/dL (ref 3.5–5.2)
CO2: 25 mEq/L (ref 19–32)
Calcium: 8.8 mg/dL (ref 8.4–10.5)
GFR calc non Af Amer: >90 mL/min (ref >90)
Sodium: 135 mEq/L (ref 135–145)

## 2012-06-11 LAB — URINALYSIS, ROUTINE W REFLEX MICROSCOPIC
Glucose, UA: NEGATIVE mg/dL
Leukocytes, UA: NEGATIVE
Protein, ur: NEGATIVE mg/dL
Specific Gravity, Urine: 1.025 (ref 1.005–1.030)
pH: 6 (ref 5.0–8.0)

## 2012-06-11 LAB — CBC WITH DIFFERENTIAL/PLATELET
Basophils Absolute: 0.1 10*3/uL (ref 0.0–0.1)
Basophils Relative: 1 % (ref 0–1)
Eosinophils Relative: 2 % (ref 0–5)
Lymphocytes Relative: 28 % (ref 12–46)
MCHC: 35.5 g/dL (ref 30.0–36.0)
Neutro Abs: 3.7 10*3/uL (ref 1.7–7.7)
Platelets: 182 10*3/uL (ref 150–400)
RDW: 13.3 % (ref 11.5–15.5)
WBC: 6.4 10*3/uL (ref 4.0–10.5)

## 2012-06-11 LAB — URINE MICROSCOPIC-ADD ON

## 2012-06-11 LAB — PREGNANCY, URINE: Preg Test, Ur: NEGATIVE

## 2012-06-11 MED ORDER — FAMOTIDINE 20 MG PO TABS: 20.0000 mg | ORAL_TABLET | Freq: Two times a day (BID) | ORAL | Status: DC

## 2012-06-11 MED ORDER — HYDROMORPHONE HCL PF 1 MG/ML IJ SOLN
1.0000 mg | Freq: Once | INTRAMUSCULAR | Status: DC
  Filled 2012-06-11: qty 1

## 2012-06-11 MED ORDER — ONDANSETRON HCL 4 MG/2ML IJ SOLN
4.0000 mg | Freq: Once | INTRAMUSCULAR | Status: AC
  Administered 2012-06-11: 4 mg via INTRAVENOUS
  Filled 2012-06-11: qty 2

## 2012-06-11 MED ORDER — MORPHINE SULFATE 4 MG/ML IJ SOLN
4.0000 mg | Freq: Once | INTRAMUSCULAR | Status: AC
  Administered 2012-06-11: 4 mg via INTRAVENOUS
  Filled 2012-06-11: qty 1

## 2012-06-11 MED ORDER — SODIUM CHLORIDE 0.9 % IV SOLN
INTRAVENOUS | Status: DC
  Administered 2012-06-11: 09:00:00 via INTRAVENOUS

## 2012-06-11 NOTE — ED Provider Notes (Signed)
History     CSN: [id number]  Arrival date & time 06/11/12  1191   First MD Initiated Contact with Patient 06/11/12 [phone]      Chief Complaint  Patient presents with  . Abdominal Pain  . Back Pain    (Consider location/radiation/quality/duration/timing/severity/associated sxs/prior treatment) HPI Comments: [name] presents with sharp pain in her right upper abdomen which intermittently radiates into her right flank and into her right chest and to her right jaw and describes a metallic taste in her mouth.  Pain is waxing and waning in character and started last night around 11 pm.  It resolved, then around 3 am woke hungry, so she ate a candy bar and went back to sleep - this did not make her symptoms return until  this am when she woke with worse pain.  She denies fevers, chills, nausea or vomiting,  And has had no diarrhea, constipation, dysuria but reports her urine has been slightly darker than normal today.  She denies personal or family history of gallbladder disease or kidney stones.  Her LMP was 9 months ago - she is currently on megace for control of a ovarian cyst.  Patient is a 46 y.o. female presenting with abdominal pain and back pain. The history is provided by the patient.  Abdominal Pain The primary symptoms of the illness include abdominal pain. The primary symptoms of the illness do not include fever, fatigue, shortness of breath, nausea, vomiting, diarrhea or dysuria.  Additional symptoms associated with the illness include back pain. Symptoms associated with the illness do not include hematuria or frequency.  Back Pain  Associated symptoms include abdominal pain. Pertinent negatives include no chest pain, no fever, no numbness, no headaches, no dysuria and no weakness.    Past Medical History  Diagnosis Date  . COPD (chronic obstructive pulmonary disease)   . Hypertension     History reviewed. No pertinent past surgical history.  No family history on  file.  History  Substance Use Topics  . Smoking status: Never Smoker   . Smokeless tobacco: Never Used  . Alcohol Use: No    OB History    Grav Para Term Preterm Abortions TAB SAB Ect Mult Living                  Review of Systems  Constitutional: Negative for fever, appetite change and fatigue.  HENT: Negative for congestion, sore throat and neck pain.   Eyes: Negative.   Respiratory: Negative for chest tightness and shortness of breath.   Cardiovascular: Negative for chest pain.  Gastrointestinal: Positive for abdominal pain. Negative for nausea, vomiting, diarrhea and abdominal distention.  Genitourinary: Positive for flank pain. Negative for dysuria, frequency and hematuria.  Musculoskeletal: Positive for back pain. Negative for joint swelling and arthralgias.  Skin: Negative.  Negative for rash and wound.  Neurological: Negative for dizziness, weakness, light-headedness, numbness and headaches.  Hematological: Negative.   Psychiatric/Behavioral: Negative.     Allergies  Penicillins  Home Medications   Current Outpatient Rx  Name Route Sig Dispense Refill  . BUDESONIDE-FORMOTEROL FUMARATE 160-4.5 MCG/ACT IN AERO Inhalation Inhale 2 puffs into the lungs daily.    Marland Kitchen HYDROCHLOROTHIAZIDE 25 MG PO TABS Oral Take 25 mg by mouth daily.    . IBUPROFEN 800 MG PO TABS Oral Take 800 mg by mouth every 8 (eight) hours as needed. For pain    . MEGESTROL ACETATE 40 MG PO TABS Oral Take 40 mg by mouth daily.     Marland Kitchen  FAMOTIDINE 20 MG PO TABS Oral Take 1 tablet (20 mg total) by mouth 2 (two) times daily. 30 tablet 0    BP 129/75  Pulse 63  Temp 98.6 F (37 C) (Oral)  Resp 16  SpO2 99%  LMP 04/14/2011  Physical Exam  Nursing note and vitals reviewed. Constitutional: She appears well-developed and well-nourished.  HENT:  Head: Normocephalic and atraumatic.  Eyes: Conjunctivae normal are normal.  Neck: Normal range of motion.  Cardiovascular: Normal rate, regular rhythm,  normal heart sounds and intact distal pulses.   Pulmonary/Chest: Effort normal and breath sounds normal. She has no wheezes.  Abdominal: Soft. Bowel sounds are normal. She exhibits no mass. There is tenderness in the right upper quadrant. There is CVA tenderness. There is no rigidity, no rebound and no guarding.  Musculoskeletal: Normal range of motion.  Neurological: She is alert.  Skin: Skin is warm and dry.  Psychiatric: She has a normal mood and affect.    ED Course  Procedures (including critical care time)  Labs Reviewed  COMPREHENSIVE METABOLIC PANEL - Abnormal; Notable for the following:    Potassium 3.3 (*)     All other components within normal limits  URINALYSIS, ROUTINE W REFLEX MICROSCOPIC - Abnormal; Notable for the following:    Hgb urine dipstick MODERATE (*)     All other components within normal limits  URINE MICROSCOPIC-ADD ON - Abnormal; Notable for the following:    Squamous Epithelial / LPF FEW (*)     Bacteria, UA FEW (*)     All other components within normal limits  CBC WITH DIFFERENTIAL  LIPASE, BLOOD  PREGNANCY, URINE   Ct Abdomen Pelvis Wo Contrast  06/11/2012  **ADDENDUM** CREATED: 06/11/2012 11:28:31  The original report was by Dr. Gaylyn Rong.  The following addendum is by Dr. Gaylyn Rong:  A fourth impression should read, "4.  Bilaterally symmetric sacroiliitis.  **END ADDENDUM** SIGNED BY: Soyla Murphy. Ova Freshwater, M.D.   06/11/2012  *RADIOLOGY REPORT*  Clinical Data: Pain.  CT ABDOMEN AND PELVIS WITHOUT CONTRAST  Technique:  Multidetector CT imaging of the abdomen and pelvis was performed following the standard protocol without intravenous contrast.  Comparison: 06/07/2008  Findings: The liver, spleen, pancreas, and adrenal glands appear unremarkable.  The gallbladder and biliary system appear unremarkable.  No pathologic retroperitoneal or porta hepatis adenopathy is identified.  2 mm right kidney lower pole nonobstructive calculus observed.  No  additional renal calculi or ureteral calculi.  Renal contour unremarkable.  Appendix normal.  No dilated bowel noted.  Suspected posterior positioning of the right ovary, otherwise unremarkable contour of uterus and ovaries.  Urinary bladder unremarkable.  Abnormal sclerosis is present along both the sacral and iliac sides of the sacroiliac joints, compatible with symmetric bilateral sacroiliitis.  The left inguinal soft tissue density on image 82 of series 2 appears to represent a varicosity of the anterior accessory saphenous vein rather than a lymph node.  IMPRESSION:  1.  Nonobstructive 2 mm right kidney lower pole calculus.  No ureteral calculus or hydronephrosis demonstrated.  Appendix normal; cause for right flank pain is not identified.   Original Report Authenticated By: Dellia Cloud, M.D.      1. Abdominal pain     Pt given NS IV,  Morphine 4 mg , zofran 4 mg IV,  Symptoms resolved.  MDM  CT scan negative for ureteral stone,  Discussed results with pt as well as reviewed labs and Ct results.  Pt is pain free at  time of dc,  Hungry and anxious to leave so can eat lunch.  Advised recheck by pcp if sx return,  Or return here if sx worsen.  The patient appears reasonably screened and/or stabilized for discharge and I doubt any other medical condition or other [facility] requiring further screening, evaluation, or treatment in the ED at this time prior to discharge.       Date: 06/11/2012  Rate: 89  Rhythm: normal sinus rhythm  QRS Axis: normal  Intervals: QT prolonged  ST/T Wave abnormalities: normal  Conduction Disutrbances:none  Narrative Interpretation:   Old EKG Reviewed: none available     Burgess Amor, PA 06/11/12 1226

## 2012-06-11 NOTE — ED Notes (Signed)
Pt states she started having RUQ abdominal pain radiating to her mid back. Pt denies N/V/D. Pt reports pain of 8/10

## 2012-06-11 NOTE — ED Provider Notes (Signed)
Medical screening examination/treatment/procedure(s) were performed by non-physician practitioner and as supervising physician I was immediately available for consultation/collaboration.   Benny Lennert, MD 06/11/12 1431

## 2013-01-17 ENCOUNTER — Other Ambulatory Visit: Payer: Self-pay | Admitting: Adult Health

## 2013-02-25 ENCOUNTER — Other Ambulatory Visit: Payer: Self-pay | Admitting: Adult Health

## 2013-04-29 ENCOUNTER — Other Ambulatory Visit: Payer: Self-pay

## 2013-04-29 DIAGNOSIS — Z1231 Encounter for screening mammogram for malignant neoplasm of breast: Secondary | ICD-10-CM

## 2013-05-18 ENCOUNTER — Ambulatory Visit: Payer: BC Managed Care – PPO

## 2013-06-01 ENCOUNTER — Ambulatory Visit
Admission: RE | Admit: 2013-06-01 | Discharge: 2013-06-01 | Disposition: A | Discharge status: Home / Self Care | Payer: BC Managed Care – PPO | Source: Ambulatory Visit

## 2013-06-01 DIAGNOSIS — Z1231 Encounter for screening mammogram for malignant neoplasm of breast: Secondary | ICD-10-CM

## 2013-07-22 LAB — HM PAP SMEAR: HM Pap smear: NEGATIVE

## 2013-09-03 ENCOUNTER — Emergency Department (HOSPITAL_COMMUNITY)
Admission: EM | Admit: 2013-09-03 | Discharge: 2013-09-03 | Disposition: A | Discharge status: Home / Self Care | Payer: BC Managed Care – PPO | Attending: Emergency Medicine | Admitting: Emergency Medicine

## 2013-09-03 ENCOUNTER — Emergency Department (HOSPITAL_COMMUNITY): Payer: BC Managed Care – PPO

## 2013-09-03 ENCOUNTER — Encounter (HOSPITAL_COMMUNITY): Payer: Self-pay | Admitting: Emergency Medicine

## 2013-09-03 DIAGNOSIS — I10 Essential (primary) hypertension: Secondary | ICD-10-CM | POA: Insufficient documentation

## 2013-09-03 DIAGNOSIS — IMO0001 Reserved for inherently not codable concepts without codable children: Secondary | ICD-10-CM | POA: Insufficient documentation

## 2013-09-03 DIAGNOSIS — M2550 Pain in unspecified joint: Secondary | ICD-10-CM | POA: Insufficient documentation

## 2013-09-03 DIAGNOSIS — J069 Acute upper respiratory infection, unspecified: Secondary | ICD-10-CM | POA: Insufficient documentation

## 2013-09-03 DIAGNOSIS — J440 Chronic obstructive pulmonary disease with acute lower respiratory infection: Secondary | ICD-10-CM | POA: Insufficient documentation

## 2013-09-03 DIAGNOSIS — J40 Bronchitis, not specified as acute or chronic: Secondary | ICD-10-CM

## 2013-09-03 DIAGNOSIS — J209 Acute bronchitis, unspecified: Secondary | ICD-10-CM | POA: Insufficient documentation

## 2013-09-03 DIAGNOSIS — R630 Anorexia: Secondary | ICD-10-CM | POA: Insufficient documentation

## 2013-09-03 DIAGNOSIS — E079 Disorder of thyroid, unspecified: Secondary | ICD-10-CM | POA: Insufficient documentation

## 2013-09-03 DIAGNOSIS — IMO0002 Reserved for concepts with insufficient information to code with codable children: Secondary | ICD-10-CM | POA: Insufficient documentation

## 2013-09-03 DIAGNOSIS — Z880 Allergy status to penicillin: Secondary | ICD-10-CM | POA: Insufficient documentation

## 2013-09-03 DIAGNOSIS — R110 Nausea: Secondary | ICD-10-CM | POA: Insufficient documentation

## 2013-09-03 DIAGNOSIS — R5381 Other malaise: Secondary | ICD-10-CM | POA: Insufficient documentation

## 2013-09-03 DIAGNOSIS — R42 Dizziness and giddiness: Secondary | ICD-10-CM | POA: Insufficient documentation

## 2013-09-03 DIAGNOSIS — Z79899 Other long term (current) drug therapy: Secondary | ICD-10-CM | POA: Insufficient documentation

## 2013-09-03 HISTORY — DX: Disorder of thyroid, unspecified: E07.9

## 2013-09-03 LAB — COMPREHENSIVE METABOLIC PANEL
AST: 18 U/L (ref 0–37)
Albumin: 3.3 g/dL — ABNORMAL LOW (ref 3.5–5.2)
Alkaline Phosphatase: 115 U/L (ref 39–117)
BUN: 10 mg/dL (ref 6–23)
Chloride: 97 mEq/L (ref 96–112)
Potassium: 2.9 mEq/L — ABNORMAL LOW (ref 3.5–5.1)
Total Bilirubin: 0.3 mg/dL (ref 0.3–1.2)
Total Protein: 7 g/dL (ref 6.0–8.3)

## 2013-09-03 LAB — CBC WITH DIFFERENTIAL/PLATELET
Basophils Absolute: 0 10*3/uL (ref 0.0–0.1)
Basophils Relative: 0 % (ref 0–1)
Eosinophils Absolute: 0.5 10*3/uL (ref 0.0–0.7)
MCH: 29.3 pg (ref 26.0–34.0)
MCHC: 33.9 g/dL (ref 30.0–36.0)
Neutro Abs: 8.3 10*3/uL — ABNORMAL HIGH (ref 1.7–7.7)
Neutrophils Relative %: 75 % (ref 43–77)
Platelets: 221 10*3/uL (ref 150–400)
RDW: 13.9 % (ref 11.5–15.5)

## 2013-09-03 LAB — PRO B NATRIURETIC PEPTIDE: Pro B Natriuretic peptide (BNP): 59.8 pg/mL (ref 0–125)

## 2013-09-03 LAB — TROPONIN I: Troponin I: <0.3 ng/mL (ref <0.30)

## 2013-09-03 MED ORDER — PREDNISONE 50 MG PO TABS
60.0000 mg | ORAL_TABLET | Freq: Once | ORAL | Status: AC
  Administered 2013-09-03: 60 mg via ORAL
  Filled 2013-09-03 (×2): qty 1

## 2013-09-03 MED ORDER — ALBUTEROL SULFATE (5 MG/ML) 0.5% IN NEBU
5.0000 mg | INHALATION_SOLUTION | Freq: Once | RESPIRATORY_TRACT | Status: AC
  Administered 2013-09-03: 5 mg via RESPIRATORY_TRACT
  Filled 2013-09-03: qty 1

## 2013-09-03 MED ORDER — DOXYCYCLINE HYCLATE 100 MG PO CAPS: 100.0000 mg | ORAL_CAPSULE | Freq: Two times a day (BID) | ORAL | Status: DC

## 2013-09-03 MED ORDER — ALBUTEROL (5 MG/ML) CONTINUOUS INHALATION SOLN
10.0000 mg/h | INHALATION_SOLUTION | Freq: Once | RESPIRATORY_TRACT | Status: AC
  Administered 2013-09-03: 10 mg/h via RESPIRATORY_TRACT
  Filled 2013-09-03: qty 20

## 2013-09-03 MED ORDER — ALBUTEROL SULFATE HFA 108 (90 BASE) MCG/ACT IN AERS: 2.0000 | INHALATION_SPRAY | Freq: Four times a day (QID) | RESPIRATORY_TRACT | Status: DC | PRN

## 2013-09-03 MED ORDER — IPRATROPIUM BROMIDE 0.02 % IN SOLN
0.5000 mg | Freq: Once | RESPIRATORY_TRACT | Status: AC
  Administered 2013-09-03: 0.5 mg via RESPIRATORY_TRACT
  Filled 2013-09-03: qty 2.5

## 2013-09-03 MED ORDER — POTASSIUM CHLORIDE CRYS ER 20 MEQ PO TBCR
40.0000 meq | EXTENDED_RELEASE_TABLET | Freq: Once | ORAL | Status: AC
  Administered 2013-09-03: 40 meq via ORAL
  Filled 2013-09-03: qty 2

## 2013-09-03 MED ORDER — PREDNISONE 50 MG PO TABS: ORAL_TABLET | ORAL | Status: DC

## 2013-09-03 MED ORDER — ONDANSETRON 4 MG PO TBDP
4.0000 mg | ORAL_TABLET | Freq: Once | ORAL | Status: AC
  Administered 2013-09-03: 4 mg via ORAL
  Filled 2013-09-03: qty 1

## 2013-09-03 NOTE — ED Notes (Signed)
Reports having cold symptoms for the past 3 days. Today has become nauseated & more SOB, pt has given herself 2 breathing TX at home, the last TX was around 1600.

## 2013-09-03 NOTE — ED Provider Notes (Signed)
CSN: [id number]     Arrival date & time 09/03/13  1754 History   First MD Initiated Contact with Patient 09/03/13 1803     Chief Complaint  Patient presents with  . Shortness of Breath  . URI  . Nausea   (Consider location/radiation/quality/duration/timing/severity/associated sxs/prior Treatment) HPI Comments: 2 day history of upper respiratory symptoms with cough, congestion and runny nose and sore throat. Today she had worsening shortness of breath and nausea. She has a history of COPD not on oxygen at home. She's not smoked. She's been using albuterol at home without relief as well as her usual self according. She's not been admitted for several years. Denies any chest pain, back pain or abdominal pain. No fever. No recent travel or sick contacts. She works at a nursing home. She complains of some nausea that has started within the past few minutes. Denies abdominal pain.  Patient is a 47 y.o. female presenting with URI. The history is provided by the patient.  URI Presenting symptoms: congestion, cough, fatigue and rhinorrhea   Presenting symptoms: no fever   Associated symptoms: arthralgias and myalgias   Associated symptoms: no headaches and no neck pain     Past Medical History  Diagnosis Date  . COPD (chronic obstructive pulmonary disease)   . Hypertension   . Thyroid disease    Past Surgical History  Procedure Laterality Date  . Nasal sinus surgery     No family history on file. History  Substance Use Topics  . Smoking status: Never Smoker   . Smokeless tobacco: Never Used  . Alcohol Use: No   OB History   Grav Para Term Preterm Abortions TAB SAB Ect Mult Living                 Review of Systems  Constitutional: Positive for activity change, appetite change and fatigue. Negative for fever.  HENT: Positive for congestion, postnasal drip and rhinorrhea. Negative for trouble swallowing.   Respiratory: Positive for cough, chest tightness and shortness of breath.    Cardiovascular: Negative for chest pain.  Gastrointestinal: Positive for nausea. Negative for vomiting and abdominal pain.  Genitourinary: Negative for dysuria, hematuria, vaginal bleeding and vaginal discharge.  Musculoskeletal: Positive for arthralgias and myalgias. Negative for back pain and neck pain.  Skin: Negative for rash.  Neurological: Positive for dizziness and light-headedness. Negative for weakness and headaches.  A complete 10 system review of systems was obtained and all systems are negative except as noted in the HPI and PMH.    Allergies  Penicillins  Home Medications   Current Outpatient Rx  Name  Route  Sig  Dispense  Refill  . albuterol (PROAIR HFA) 108 (90 BASE) MCG/ACT inhaler   Inhalation   Inhale 1 puff into the lungs every 6 (six) hours as needed for wheezing or shortness of breath.         . budesonide-formoterol (SYMBICORT) 160-4.5 MCG/ACT inhaler   Inhalation   Inhale 2 puffs into the lungs daily.         . hydrochlorothiazide (HYDRODIURIL) 25 MG tablet   Oral   Take 25 mg by mouth daily.         Marland Kitchen ibuprofen (ADVIL,MOTRIN) 200 MG tablet   Oral   Take 800 mg by mouth every 6 (six) hours as needed.         Marland Kitchen levothyroxine (SYNTHROID, LEVOTHROID) 25 MCG tablet   Oral   Take 25 mcg by mouth daily.         Marland Kitchen  piroxicam (FELDENE) 20 MG capsule   Oral   Take 20 mg by mouth daily.         Marland Kitchen albuterol (PROVENTIL HFA;VENTOLIN HFA) 108 (90 BASE) MCG/ACT inhaler   Inhalation   Inhale 2 puffs into the lungs every 6 (six) hours as needed for wheezing or shortness of breath.   1 Inhaler   2   . doxycycline (VIBRAMYCIN) 100 MG capsule   Oral   Take 1 capsule (100 mg total) by mouth 2 (two) times daily.   20 capsule   0   . predniSONE (DELTASONE) 50 MG tablet      1 tablet PO daily   5 tablet   0    BP 139/89  Pulse 115  Temp(Src) 98.3 F (36.8 C) (Oral)  Resp 24  Ht 5\' 7"  (1.702 m)  Wt 230 lb (104.327 kg)  BMI 36.01 kg/m2   SpO2 91%  LMP 04/14/2011 Physical Exam  Constitutional: She is oriented to person, place, and time. She appears well-developed and well-nourished. No distress.  Speaking in full sentences  HENT:  Head: Normocephalic and atraumatic.  Mouth/Throat: No oropharyngeal exudate.  Eyes: Conjunctivae are normal. Pupils are equal, round, and reactive to light.  Neck: Normal range of motion. Neck supple.  Cardiovascular: Normal rate, regular rhythm and normal heart sounds.   No murmur heard. Pulmonary/Chest: Effort normal. She has wheezes.  Diffuse inspiratory and expiratory wheezing with moderate air exchange  Abdominal: Soft. There is no tenderness. There is no rebound and no guarding.  Musculoskeletal: Normal range of motion. She exhibits no edema and no tenderness.  Neurological: She is alert and oriented to person, place, and time. No cranial nerve deficit. She exhibits normal muscle tone. Coordination normal.  Skin: Skin is warm.    ED Course  Procedures (including critical care time) Labs Review Labs Reviewed  CBC WITH DIFFERENTIAL - Abnormal; Notable for the following:    WBC 11.1 (*)    Neutro Abs 8.3 (*)    All other components within normal limits  COMPREHENSIVE METABOLIC PANEL - Abnormal; Notable for the following:    Potassium 2.9 (*)    Glucose, Bld 127 (*)    Albumin 3.3 (*)    All other components within normal limits  TROPONIN I  PRO B NATRIURETIC PEPTIDE  D-DIMER, QUANTITATIVE   Imaging Review Dg Chest 2 View  09/03/2013   CLINICAL DATA:  Shortness of breath with nausea.  EXAM: CHEST  2 VIEW  COMPARISON:  10/02/2010.  FINDINGS: The heart appears mildly enlarged. There is slight vascular congestion. No active infiltrates or failure. Slight increase cardiac size from priors. No osseous lesions.  IMPRESSION: Mild cardiac enlargement.  No active cardiopulmonary disease.   Electronically Signed   By: Davonna Belling M.D.   On: 09/03/2013 19:37    EKG Interpretation     Date/Time:  Saturday September 03 2013 18:41:04 EST Ventricular Rate:  74 PR Interval:  158 QRS Duration: 80 QT Interval:  452 QTC Calculation: 501 R Axis:   47 Text Interpretation:  Sinus rhythm with Premature atrial complexes Prolonged QT Abnormal ECG When compared with ECG of 11-Jun-2012 08:19, No significant change was found Confirmed by Manus Gunning  MD, Sharman Garrott ([id number]) on 09/03/2013 6:54:56 PM            MDM   1. Bronchitis    History of COPD with upper respiratory symptoms for the past 2 days wheezing on exam. No distress or hypoxia.  Nebs, steroids, antiemetics.  EKG unchanged. CXR clear.  Wheezing improved after treatments x2. D-dimer and troponin negative. Potassium replaced. Wheezing worsened upon ambulation but patient did not become hypoxic.  Continuous neb started.  After continuous nebulizer, patient feels better a work of breathing is improved. She still has scattered wheezing but much improved. No hypoxia with ambulation.  She is given option of admission for further treatment and observation but she prefers to go home. She'll be treated for bronchitis with nebulizers, steroids and antibiotics. She is not hypoxic and not in any distress. O2 saturation 91% on ambulation.  Patient states that her breathing is "fine" and she wants to go home. Encouraged to return at any time if she feels like she is getting worse.  Observation again offered and declined by patient.  BP 139/89  Pulse 115  Temp(Src) 98.3 F (36.8 C) (Oral)  Resp 24  Ht 5\' 7"  (1.702 m)  Wt 230 lb (104.327 kg)  BMI 36.01 kg/m2  SpO2 91%  LMP 04/14/2011   Glynn Octave, MD 09/03/13 2152

## 2013-09-03 NOTE — ED Notes (Signed)
Pt c/o cough, SOB x3 days. Pt states cough is productive with clear sputum. Pt has hx of COPD and sues nebulizer and inhaler at home but denies any relief since symptoms began. Pt also reports nausea which began this afternoon.

## 2013-10-25 ENCOUNTER — Ambulatory Visit: Payer: BC Managed Care – PPO | Admitting: Orthopedic Surgery

## 2013-11-01 ENCOUNTER — Telehealth: Payer: Self-pay | Admitting: Orthopedic Surgery

## 2013-11-01 ENCOUNTER — Ambulatory Visit (INDEPENDENT_AMBULATORY_CARE_PROVIDER_SITE_OTHER): Payer: BC Managed Care – HMO | Admitting: Orthopedic Surgery

## 2013-11-01 ENCOUNTER — Ambulatory Visit (INDEPENDENT_AMBULATORY_CARE_PROVIDER_SITE_OTHER): Payer: BC Managed Care – HMO

## 2013-11-01 VITALS — BP 142/88 | Ht 63.0 in | Wt 228.0 lb

## 2013-11-01 DIAGNOSIS — M25559 Pain in unspecified hip: Secondary | ICD-10-CM

## 2013-11-01 DIAGNOSIS — M169 Osteoarthritis of hip, unspecified: Secondary | ICD-10-CM

## 2013-11-01 DIAGNOSIS — M24159 Other articular cartilage disorders, unspecified hip: Secondary | ICD-10-CM

## 2013-11-01 DIAGNOSIS — M1610 Unilateral primary osteoarthritis, unspecified hip: Secondary | ICD-10-CM

## 2013-11-01 DIAGNOSIS — M25551 Pain in right hip: Secondary | ICD-10-CM

## 2013-11-01 NOTE — Progress Notes (Signed)
Patient ID: [name], female   DOB: 06/20/1966, 48 y.o.   MRN: [id number] Chief Complaint  Patient presents with  . Hip Pain    Right HIp Pain, no injury. Referred by Dr. Sherwood GamblerFusco    BP 142/88  Ht 5\' 3"  (1.6 m)  Wt 228 lb (103.42 kg)  BMI 40.40 kg/m2  LMP 04/14/2011  Encounter Diagnoses  Name Primary?  . Right hip pain   . Labral tear of hip, degenerative Yes    This patient presents with a month history of pain in her groin and anterior thigh unrelieved by ibuprofen Toradol and tramadol which she's taken over the last 3 months. She reports 6/10 constant throbbing pain in the groin with catching pain in the anterior thigh which is worse climbing steps or sitting in a car. System review  Weight gain shortness of breath wheezing cough constipation joint pain easy bruising excessive thirst no other findings  Allergy to penicillin  Medical problems COPD hypertension thyroid disease   Sinusitis surgery  Medications  Symbicort hydrochlorothiazide levothyroxine and tramadol family history arthritis cancer diabetes  currently employed as a CNA does not smoke  Vital signs are stable exam shows mild to moderate obesity RN at x3 mood normal gait antalgic favoring the right side leg lengths are equal hip flexor he produces pain as does internal rotation stability of the hip is normal motor exam normal skin intact pulses normal sensation normal  Left hip for comparison shows no pain with hip flexion or internal rotation  X-ray shows no significant degenerative changes  Impression labral tear recommend MRI with contrast in the joint

## 2013-11-01 NOTE — Telephone Encounter (Signed)
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  States she does want and need to work.  Please advise of any recommendations or restrictions.  Her ph# is [phone].

## 2013-11-22 ENCOUNTER — Ambulatory Visit: Payer: BC Managed Care – PPO | Admitting: Orthopedic Surgery

## 2013-11-23 ENCOUNTER — Telehealth: Payer: Self-pay | Admitting: Orthopedic Surgery

## 2013-11-23 NOTE — Telephone Encounter (Signed)
Regarding the ordered MRI for right hip, CPT 73722, w/contrast - contacted [employer] at [phone], initially on 11/07/13.  Due to conflicting information provided by insurer, we were directed to "[employer]" for the pre-authorization process(ph [phone]), which we have since been advised that this plan does not use "[employer]" - therefore, patient's information is not in [employer]'s system.  We had also called back to patient to relay this information and to verify her insurance card.  Patient's card on file is correct, and coverage is current and active (plan effective 09/22/13) per online verification.  Due to severe weather issues, and office closings on several days, we notified patient of status and pursued contacting [employer] regarding pre-authorization for the MRI.   Per phone with Berenice Primas, 11/18/13, call ref# [id number], "no pre-cert is required; coverage is at 80%, after $625 deductible is met.  Additional calls made to re-verify that no other requirements exist prior to scheduling MRI for patient - spoke with Brandi B, who also states that no pre-cert is required, call ref# [id number].   Today, 11/22/13, I called patient to schedule the MRI based on this insurance updated information.  Left a voice message to return call regarding the MRI appointment. In the meantime, I received a call from [place], Tiara K, 4:57pm, states she has patient on the other line, and states that it IS correct that this plan does not use [employer], and again, as noted, No pre-certification is required.

## 2013-11-30 NOTE — Telephone Encounter (Signed)
Per Debby Budammy Long, LPN, MRI's scheduled at [facility] 12/02/13, 9:30am / 10:00am - patient was contacted and is aware of MRI appointment and Follow up appointment office visit here for results, which has been scheduled.

## 2013-12-02 ENCOUNTER — Encounter (HOSPITAL_COMMUNITY): Payer: Self-pay

## 2013-12-02 ENCOUNTER — Other Ambulatory Visit: Payer: Self-pay | Admitting: Orthopedic Surgery

## 2013-12-02 ENCOUNTER — Ambulatory Visit (HOSPITAL_COMMUNITY)
Admission: RE | Admit: 2013-12-02 | Discharge: 2013-12-02 | Disposition: A | Discharge status: Home / Self Care | Payer: BC Managed Care – PPO | Source: Ambulatory Visit | Attending: Orthopedic Surgery | Admitting: Orthopedic Surgery

## 2013-12-02 DIAGNOSIS — M25559 Pain in unspecified hip: Secondary | ICD-10-CM | POA: Insufficient documentation

## 2013-12-02 DIAGNOSIS — M24159 Other articular cartilage disorders, unspecified hip: Secondary | ICD-10-CM

## 2013-12-02 DIAGNOSIS — M25551 Pain in right hip: Secondary | ICD-10-CM

## 2013-12-02 DIAGNOSIS — M169 Osteoarthritis of hip, unspecified: Secondary | ICD-10-CM

## 2013-12-02 DIAGNOSIS — M6789 Other specified disorders of synovium and tendon, multiple sites: Secondary | ICD-10-CM | POA: Insufficient documentation

## 2013-12-02 MED ORDER — POVIDONE-IODINE 10 % EX SOLN
CUTANEOUS | Status: AC
  Administered 2013-12-02: 1
  Filled 2013-12-02: qty 45

## 2013-12-02 MED ORDER — LIDOCAINE HCL (PF) 1 % IJ SOLN
INTRAMUSCULAR | Status: AC
  Filled 2013-12-02: qty 10

## 2013-12-02 MED ORDER — LIDOCAINE HCL (PF) 1 % IJ SOLN
15.0000 mL | Freq: Once | INTRAMUSCULAR | Status: AC
  Administered 2013-12-02: 5 mL

## 2013-12-02 MED ORDER — GADOBENATE DIMEGLUMINE 529 MG/ML IV SOLN
5.0000 mL | Freq: Once | INTRAVENOUS | Status: AC | PRN
  Administered 2013-12-02: 0.05 mL via INTRAVENOUS

## 2013-12-02 MED ORDER — IOHEXOL 300 MG/ML  SOLN
50.0000 mL | Freq: Once | INTRAMUSCULAR | Status: AC | PRN
  Administered 2013-12-02: 20 mL via INTRAVENOUS

## 2013-12-02 MED ORDER — LIDOCAINE HCL (PF) 1 % IJ SOLN
INTRAMUSCULAR | Status: AC
  Filled 2013-12-02: qty 5

## 2013-12-02 NOTE — Procedures (Signed)
Preprocedure Dx: RIGHT hip pain Postprocedure Dx: RIGHT hip pain Procedure  Fluoroscopically guided RIGHT hip joint injection for MR arthrogrpahy Radiologist:  Tyron RussellBoles Anesthesia:  3 ml of 1% Injectate:  14 ml of [solution of 0,05 ml Multihance in 20 ml Omnipaque 300] Fluoro time:  3 minutes 36 seconds EBL:   < 1 ml Complications: None

## 2013-12-06 ENCOUNTER — Ambulatory Visit (INDEPENDENT_AMBULATORY_CARE_PROVIDER_SITE_OTHER): Payer: BC Managed Care – PPO | Admitting: Orthopedic Surgery

## 2013-12-06 ENCOUNTER — Encounter: Payer: Self-pay | Admitting: Orthopedic Surgery

## 2013-12-06 VITALS — BP 134/80 | Ht 63.0 in | Wt 228.0 lb

## 2013-12-06 DIAGNOSIS — M6240 Contracture of muscle, unspecified site: Secondary | ICD-10-CM

## 2013-12-06 DIAGNOSIS — M24551 Contracture, right hip: Secondary | ICD-10-CM | POA: Insufficient documentation

## 2013-12-06 DIAGNOSIS — M722 Plantar fascial fibromatosis: Secondary | ICD-10-CM | POA: Insufficient documentation

## 2013-12-06 DIAGNOSIS — M25551 Pain in right hip: Secondary | ICD-10-CM

## 2013-12-06 DIAGNOSIS — M25559 Pain in unspecified hip: Secondary | ICD-10-CM

## 2013-12-06 NOTE — Progress Notes (Signed)
Patient ID: [name], female   DOB: 02/26/1966, 48 y.o.   MRN: [id number]  Chief Complaint  Patient presents with  . Results    MRI results right hip    MRI results read as follows. IMPRESSION: 1. No labral tear of the right hip. 2. Tendinosis of bilateral hamstring origins with small partial Tears.  Previous chart information Chief Complaint   Patient presents with   .  Hip Pain       Right HIp Pain, no injury. Referred by Dr. Sherwood GamblerFusco     BP 142/88  Ht 5\' 3"  (1.6 m)  Wt 228 lb (103.42 kg)  BMI 40.40 kg/m2  LMP 04/14/2011    Encounter Diagnoses   Name  Primary?   .  Right hip pain     .  Labral tear of hip, degenerative  Yes    This patient presents with a month history of pain in her groin and anterior thigh unrelieved by ibuprofen Toradol and tramadol which she's taken over the last 3 months. She reports 6/10 constant throbbing pain in the groin with catching pain in the anterior thigh which is worse climbing steps or sitting in a car.   MRI shows no abnormality in the groin or hip joint. The hamstring issue is not even symptomatic  However after reviewing this MRI with her I would advise she undergo some physical therapy to work on her hip flexors since this is the most significant area of discomfort for her  She also has a history of right heel pain consistent with plantar fasciitis, she was followed by Dr. Nolen MuMcKinney but can't get an appointment so she would like us to evaluate it she says she's done exercises in the past and used the ice treatment  She denies any numbness or tingling in the right foot  Examination shows tenderness in the heel nontender heel cord no swelling normal ankle stability normal muscle tone and function around the ankle joint skin intact good pulse normal sensation no gait abnormalities  Vitals stable. General appearance is normal, the patient is alert and oriented x3 with normal mood and affect.  Plantar fasciitis recommend injection and  therapy program along with heel cups  Follow up when necessary

## 2013-12-06 NOTE — Patient Instructions (Signed)
Call to arrange therapy at [facility] 

## 2013-12-21 ENCOUNTER — Telehealth (HOSPITAL_COMMUNITY): Payer: Self-pay

## 2014-02-09 ENCOUNTER — Telehealth (HOSPITAL_COMMUNITY): Payer: Self-pay

## 2014-05-10 ENCOUNTER — Other Ambulatory Visit: Payer: Self-pay

## 2014-05-10 DIAGNOSIS — Z1231 Encounter for screening mammogram for malignant neoplasm of breast: Secondary | ICD-10-CM

## 2014-06-02 ENCOUNTER — Ambulatory Visit
Admission: RE | Admit: 2014-06-02 | Discharge: 2014-06-02 | Disposition: A | Discharge status: Home / Self Care | Payer: BC Managed Care – PPO | Source: Ambulatory Visit

## 2014-06-02 DIAGNOSIS — Z1231 Encounter for screening mammogram for malignant neoplasm of breast: Secondary | ICD-10-CM

## 2014-07-25 LAB — LIPID PANEL
CHOLESTEROL: 215 mg/dL — AB (ref 0–200)
HDL: 58 mg/dL (ref 35–70)
LDL CALC: 26 mg/dL
LDl/HDL Ratio: 3.7
TRIGLYCERIDES: 132 mg/dL (ref 40–160)

## 2014-07-25 LAB — TSH: TSH: 3.17 u[IU]/mL (ref <=5.90)

## 2014-08-01 ENCOUNTER — Encounter: Payer: Self-pay | Admitting: *Deleted

## 2014-08-03 ENCOUNTER — Ambulatory Visit (INDEPENDENT_AMBULATORY_CARE_PROVIDER_SITE_OTHER): Payer: BC Managed Care – PPO | Admitting: Obstetrics and Gynecology

## 2014-08-03 ENCOUNTER — Encounter: Payer: Self-pay | Admitting: Obstetrics and Gynecology

## 2014-08-03 ENCOUNTER — Ambulatory Visit: Payer: Self-pay | Admitting: Obstetrics and Gynecology

## 2014-08-03 VITALS — BP 122/80 | Ht 63.0 in | Wt 218.0 lb

## 2014-08-03 DIAGNOSIS — N903 Dysplasia of vulva, unspecified: Secondary | ICD-10-CM

## 2014-08-03 NOTE — Progress Notes (Signed)
Patient ID: [name], female   DOB: 12/15/1965, 48 y.o.   MRN: [id number] Pt here today for a vulva lesion. Pt states that the area itches and sometimes causes pain. Pt states that she just has an irritated area down there.

## 2014-08-03 NOTE — Progress Notes (Signed)
Patient ID: Connie [name], female   DOB: 05/02/1966, 48 y.o.   MRN: [id number]   [facility]  Patient name: Connie DresserJoan E Hain MRN [id number]  Date of birth: 07/26/1966  CC & HPI:  Connie DresserJoan E Kanner is a 48 y.o. female presenting today for constant, burning, itching blister-like vaginal lesions.  She has used Monistat with no relief to her symptoms.  [facility] by Terie PurserSamantha Jackson, PA-C.    ROS:  All systems have been reviewed and are negative unless otherwise specified in the HPI.   Pertinent History Reviewed:   Reviewed: Significant for  Medical         Past Medical History  Diagnosis Date   COPD (chronic obstructive pulmonary disease)    Hypertension    Thyroid disease                               Surgical Hx:    Past Surgical History  Procedure Laterality Date   Nasal sinus surgery     Medications: Reviewed & Updated - see associated section                      Current outpatient prescriptions: albuterol (PROAIR HFA) 108 (90 BASE) MCG/ACT inhaler, Inhale 1 puff into the lungs every 6 (six) hours as needed for wheezing or shortness of breath., Disp: , Rfl: ;  budesonide-formoterol (SYMBICORT) 160-4.5 MCG/ACT inhaler, Inhale 2 puffs into the lungs daily., Disp: , Rfl: ;  hydrochlorothiazide (HYDRODIURIL) 25 MG tablet, Take 25 mg by mouth daily., Disp: , Rfl:  piroxicam (FELDENE) 20 MG capsule, Take 20 mg by mouth daily., Disp: , Rfl:    Social History: Reviewed -  reports that she has never smoked. She has never used smokeless tobacco.  Objective Findings:  Vitals: Blood pressure 122/80, height 5\' 3"  (1.6 m), weight 218 lb (98.884 kg), last menstrual period 04/14/2011.  Physical Examination: General appearance - alert, well appearing, and in no distress, oriented to person, place, and time and overweight Pelvic - EXTERNAL GENITALIA: multiple areas of white plaques with raised borders that will require biopsy on the right labia and some on the  left VULVA: normal appearing vulva with no masses, tenderness or lesions,  VAGINA: normal appearing vagina with normal color and discharge, no lesions,  CERVIX: normal appearing cervix without discharge or lesions,  UTERUS: uterus is normal size, shape, consistency and nontender,  ADNEXA: normal adnexa in size, nontender and no masses   Assessment & Plan:   A:  1. Vulvar skin lesion, multiple  P:  1. Follow-up in 5 days for vulvar biopsy  This chart was scribed for Tilda BurrowJohn Ferguson V, MD by Carl Bestelina Holson, ED Scribe. This patient was seen in Room 1 and the patient's care was started at 11:01 AM.

## 2014-08-08 ENCOUNTER — Ambulatory Visit (INDEPENDENT_AMBULATORY_CARE_PROVIDER_SITE_OTHER): Payer: BC Managed Care – PPO | Admitting: Obstetrics and Gynecology

## 2014-08-08 ENCOUNTER — Encounter: Payer: Self-pay | Admitting: Obstetrics and Gynecology

## 2014-08-08 ENCOUNTER — Other Ambulatory Visit: Payer: Self-pay | Admitting: Obstetrics and Gynecology

## 2014-08-08 VITALS — BP 120/80 | Ht 63.0 in | Wt 216.0 lb

## 2014-08-08 DIAGNOSIS — L439 Lichen planus, unspecified: Secondary | ICD-10-CM

## 2014-08-08 DIAGNOSIS — N903 Dysplasia of vulva, unspecified: Secondary | ICD-10-CM

## 2014-08-08 NOTE — Addendum Note (Signed)
Addended by: Richardson ChiquitoRAVIS, Lulia Schriner M on: 08/08/2014 05:12 PM   Modules accepted: Orders

## 2014-08-08 NOTE — Progress Notes (Signed)
Patient ID: [name], female   DOB: 02/21/1966, 48 y.o.   MRN: [id number] Pt here today for a biopsy. VULVAR BIOPSY NOTE The indications for vulvar biopsy (rule out neoplasia, ) were reviewed.   Risks of the biopsy including pain, bleeding, infection, inadequate specimen, scarring and need for additional procedures  were discussed. The patient stated understanding and agreed to undergo procedure today. Consent was signed,  time out performed.  The patient's vulva was prepped with Betadine. 1% lidocaine was injected into an area to left of clitoris, and a 1.5 cm area on midportion of right labium majora.2 excisional  biopsy were done, biopsy tissue was picked up with sterile forceps and sterile scissors were used to excise the lesion.  Small bleeding was noted and hemostasis was achieved using running closure with 4-0 prolene.  The patient tolerated the procedure well. Post-procedure instructions  (pelvic rest for one week) were given to the patient. The patient is to call with heavy bleeding, fever greater than 100.4, foul smelling vaginal discharge or other concerns. The patient will be return to clinic in 10 days for discussion of results.

## 2014-08-14 ENCOUNTER — Telehealth: Payer: Self-pay | Admitting: Obstetrics and Gynecology

## 2014-08-14 NOTE — Telephone Encounter (Signed)
Spoke with pt, she is aware that we do not have results yet and that I will call first thing in the morning. Pt verbalized understanding.

## 2014-08-16 ENCOUNTER — Telehealth: Payer: Self-pay | Admitting: *Deleted

## 2014-08-16 NOTE — Telephone Encounter (Signed)
Pt aware of results and aware to keep her appointment.

## 2014-08-16 NOTE — Telephone Encounter (Signed)
Pt aware of results and aware to keep her follow up appointment.

## 2014-08-22 ENCOUNTER — Telehealth: Payer: Self-pay | Admitting: *Deleted

## 2014-08-22 NOTE — Telephone Encounter (Signed)
-----   Message from Tilda BurrowJohn Ferguson V, MD sent at 08/16/2014  6:13 AM EST ----- Please remind pt she did not sign up for MyChart. Results benign, lichen planus is not cancerous

## 2014-08-22 NOTE — Telephone Encounter (Signed)
Willy EddyAshley Travis, LPN notified pt of benign biopsy results from 08/08/2014 per Dr. Emelda FearFerguson.

## 2014-08-28 ENCOUNTER — Ambulatory Visit (INDEPENDENT_AMBULATORY_CARE_PROVIDER_SITE_OTHER): Payer: BC Managed Care – PPO | Admitting: Obstetrics and Gynecology

## 2014-08-28 ENCOUNTER — Encounter: Payer: Self-pay | Admitting: Obstetrics and Gynecology

## 2014-08-28 VITALS — BP 112/60 | Ht 63.0 in | Wt 216.8 lb

## 2014-08-28 DIAGNOSIS — L438 Other lichen planus: Secondary | ICD-10-CM

## 2014-08-28 NOTE — Progress Notes (Signed)
Patient ID: [name] Tep, female   DOB: 07/09/1966, 48 y.o.   MRN: [id number]   [facility]  Patient name: [name] Urich MRN [id number]  Date of birth: 07/14/1966  CC & HPI:  [name] Larkin is a 48 y.o. female presenting today for suture removal.   ROS:  All systems have been reviewed and are negative unless otherwise indicated in the HPI.   Pertinent History Reviewed:   Reviewed: Significant for  Medical         Past Medical History  Diagnosis Date  . COPD (chronic obstructive pulmonary disease)   . Hypertension   . Thyroid disease                               Surgical Hx:    Past Surgical History  Procedure Laterality Date  . Nasal sinus surgery     Medications: Reviewed & Updated - see associated section                      Current outpatient prescriptions: albuterol (PROAIR HFA) 108 (90 BASE) MCG/ACT inhaler, Inhale 1 puff into the lungs every 6 (six) hours as needed for wheezing or shortness of breath., Disp: , Rfl: ;  budesonide-formoterol (SYMBICORT) 160-4.5 MCG/ACT inhaler, Inhale 2 puffs into the lungs daily., Disp: , Rfl: ;  hydrochlorothiazide (HYDRODIURIL) 25 MG tablet, Take 25 mg by mouth daily., Disp: , Rfl:  meloxicam (MOBIC) 15 MG tablet, Take 15 mg by mouth daily. , Disp: , Rfl: ;  piroxicam (FELDENE) 20 MG capsule, Take 20 mg by mouth daily., Disp: , Rfl:   Social History: Reviewed -  reports that she has never smoked. She has never used smokeless tobacco.  Objective Findings:  Vitals: Blood pressure 112/60, height 5\' 3"  (1.6 m), weight 216 lb 12.8 oz (98.34 kg), last menstrual period 04/14/2011.  Physical Examination: General appearance - alert, well appearing, and in no distress, oriented to person, place, and time and overweight Pelvic - VULVA: normal appearing vulva with no masses, tenderness or lesions,  VAGINA: normal appearing vagina with normal color and discharge, no lesions, 2 well-healed biopsy sites with stitches removed, full healing,  pathology likened planus  CERVIX: normal appearing cervix without discharge or lesions,  UTERUS: uterus is normal size, shape, consistency and nontender,  ADNEXA: normal adnexa in size, nontender and no masses  Assessment & Plan:   A: Lichen Planus of labia Majora , bilateral 1. Suture removal  P: topical hydrocortisone prn prurutis 1. Follow-up PRN  This chart was scribed for Tilda BurrowJohn Marwah Disbro V, MD by Carl Bestelina Holson, ED Scribe. This patient was seen in Room 2 and the patient's care was started at 4:37 PM.

## 2015-01-30 ENCOUNTER — Ambulatory Visit: Payer: Self-pay | Admitting: Orthopedic Surgery

## 2015-03-27 ENCOUNTER — Ambulatory Visit (INDEPENDENT_AMBULATORY_CARE_PROVIDER_SITE_OTHER): Payer: BLUE CROSS/BLUE SHIELD | Admitting: Orthopedic Surgery

## 2015-03-27 ENCOUNTER — Ambulatory Visit: Payer: BLUE CROSS/BLUE SHIELD

## 2015-03-27 ENCOUNTER — Encounter: Payer: Self-pay | Admitting: Orthopedic Surgery

## 2015-03-27 VITALS — BP 144/86 | Ht 63.0 in | Wt 220.0 lb

## 2015-03-27 DIAGNOSIS — S83242A Other tear of medial meniscus, current injury, left knee, initial encounter: Secondary | ICD-10-CM | POA: Diagnosis not present

## 2015-03-27 DIAGNOSIS — M25462 Effusion, left knee: Secondary | ICD-10-CM

## 2015-03-27 DIAGNOSIS — M25562 Pain in left knee: Secondary | ICD-10-CM | POA: Diagnosis not present

## 2015-03-27 DIAGNOSIS — M659 Synovitis and tenosynovitis, unspecified: Secondary | ICD-10-CM | POA: Diagnosis not present

## 2015-03-27 NOTE — Progress Notes (Signed)
Patient ID: [name], female   DOB: 08/26/1966, 49 y.o.   MRN: [id number] New problem  Chief Complaint  Patient presents with  . Knee Pain    left knee pain, referred by fusco     [name] is a 49 y.o. female.   HPI This 48 year old female presents with atraumatic onset of swelling pain in her left knee with no history of trauma. She's had symptoms for several weeks. The quality of the pain is described as throbbing aching and radiating. Location is anterior and posterior aspects of the knee focusing and primarily on the medial joint line.  Mechanical symptoms include locking, giving out. She reports stiffness and swelling.  She rates her pain a 10 and she says the pain is constant  Prior treatment diclofenac, prednisone Dosepak, 2 intramuscular injections of steroid-induced and Norco along with rest and she still has 10 out of 10 pain  Review of systems night sweats and fatigue COPD intermittent nausea lightheadedness rashes on her arms chronic back pain. Review of Systems See hpi  Past Medical History  Diagnosis Date  . COPD (chronic obstructive pulmonary disease)   . Hypertension   . Thyroid disease     Past Surgical History  Procedure Laterality Date  . Nasal sinus surgery      Family History  Problem Relation Age of Onset  . Cancer Mother   . Cancer Father   . Diabetes Brother   . Heart disease Brother   . Heart attack Brother   . Cancer Brother     melanoma    Social History History  Substance Use Topics  . Smoking status: Never Smoker   . Smokeless tobacco: Never Used  . Alcohol Use: No    Allergies  Allergen Reactions  . Penicillins Other (See Comments)    Yeast infection    Current Outpatient Prescriptions  Medication Sig Dispense Refill  . budesonide-formoterol (SYMBICORT) 160-4.5 MCG/ACT inhaler Inhale 2 puffs into the lungs daily.    . diclofenac (VOLTAREN) 75 MG EC tablet Take 75 mg by mouth 2 (two) times daily.    .  hydrochlorothiazide (HYDRODIURIL) 25 MG tablet Take 25 mg by mouth daily.    Marland Kitchen HYDROcodone-acetaminophen (NORCO/VICODIN) 5-325 MG per tablet Take 1 tablet by mouth every 6 (six) hours as needed for moderate pain.    Marland Kitchen levothyroxine (SYNTHROID, LEVOTHROID) 25 MCG tablet Take 25 mcg by mouth daily before breakfast.     No current facility-administered medications for this visit.       Physical Exam Blood pressure 144/86, height  (1.6 m), weight 220 lb (99.791 kg), last menstrual period 04/14/2011. Physical Exam The patient is well developed well nourished and well groomed. Orientation to person place and time is normal  Mood is pleasant. Ambulatory status she does walk but she is walking with a limp she does not have a supportive device. Left knee knee flexion is 100. She does not come to full extension. She has an effusion. The knee feels stable. She has normal muscle tone. Provocative tests for McMurray sign include medial joint tenderness and positive McMurray's.  The right knee flexion is 110. Full extension. Neurovascular exam is intact in both legs.   Data Reviewed X-ray disc from urgent care along with the report and I agree with the report that there is mild arthritis in the knee joint  She had a sedimentation rate which was 12 uric acid 15 rheumatoid factor was 15 with a normal being 14  ANA test was negative  Addendum arthritis primarily patellofemoral area  Assessment Encounter Diagnoses  Name Primary?  . Left knee pain   . Knee effusion, left Yes  . Medial meniscus tear, left, initial encounter   . Synovitis of knee     Plan I aspirated 25 mL of clear yellow fluid from the left knee. We injected 40 mg of cortisone.  MRI left knee differential diagnosis meniscal tear versus arthritic effusion  Procedure note injection and aspiration left knee joint  Verbal consent was obtained to aspirate and inject the left knee joint   Timeout was completed to confirm the  site of aspiration and injection  An 18-gauge needle was used to aspirate the left knee joint from a suprapatellar lateral approach.  The medications used were 40 mg of Depo-Medrol and 1% lidocaine 3 cc  Anesthesia was provided by ethyl chloride and the skin was prepped with alcohol.  After cleaning the skin with alcohol an 18-gauge needle was used to aspirate the right knee joint.  We obtained 25 cc of fluid  We follow this by injection of 40 mg of Depo-Medrol and 3 cc 1% lidocaine.  There were no complications. A sterile bandage was applied.

## 2015-03-27 NOTE — Patient Instructions (Addendum)
We will schedule MRI for you and call you with appt and result    Joint Injection Care After Refer to this sheet in the next few days. These instructions provide you with information on caring for yourself after you have had a joint injection. Your caregiver also may give you more specific instructions. Your treatment has been planned according to current medical practices, but problems sometimes occur. Call your caregiver if you have any problems or questions after your procedure. After any type of joint injection, it is not uncommon to experience:  Soreness, swelling, or bruising around the injection site.  Mild numbness, tingling, or weakness around the injection site caused by the numbing medicine used before or with the injection. It also is possible to experience the following effects associated with the specific agent after injection:  Iodine-based contrast agents:  Allergic reaction (itching, hives, widespread redness, and swelling beyond the injection site).  Corticosteroids (These effects are rare.):  Allergic reaction.  Increased blood sugar levels (If you have diabetes and you notice that your blood sugar levels have increased, notify your caregiver).  Increased blood pressure levels.  Mood swings.  Hyaluronic acid in the use of viscosupplementation.  Temporary heat or redness.  Temporary rash and itching.  Increased fluid accumulation in the injected joint. These effects all should resolve within a day after your procedure.  HOME CARE INSTRUCTIONS  Limit yourself to light activity the day of your procedure. Avoid lifting heavy objects, bending, stooping, or twisting.  Take prescription or over-the-counter pain medication as directed by your caregiver.  You may apply ice to your injection site to reduce pain and swelling the day of your procedure. Ice may be applied 03-04 times:  Put ice in a plastic bag.  Place a towel between your skin and the bag.  Leave  the ice on for no longer than 15-20 minutes each time. SEEK IMMEDIATE MEDICAL CARE IF:   Pain and swelling get worse rather than better or extend beyond the injection site.  Numbness does not go away.  Blood or fluid continues to leak from the injection site.  You have chest pain.  You have swelling of your face or tongue.  You have trouble breathing or you become dizzy.  You develop a fever, chills, or severe tenderness at the injection site that last longer than 1 day. MAKE SURE YOU:  Understand these instructions.  Watch your condition.  Get help right away if you are not doing well or if you get worse. Document Released: 05/22/2011 Document Revised: 12/01/2011 Document Reviewed: 05/22/2011 [facility] Patient Information 2015 [employer] [place]. This information is not intended to replace advice given to you by your health care provider. Make sure you discuss any questions you have with your health care provider.

## 2015-04-04 ENCOUNTER — Telehealth: Payer: Self-pay | Admitting: Orthopedic Surgery

## 2015-04-04 NOTE — Telephone Encounter (Signed)
Patient is calling stating that so far she has not gotten any relief from the injection in her left knee from her ov on 03/27/2015, please advise?

## 2015-04-05 ENCOUNTER — Other Ambulatory Visit: Payer: Self-pay | Admitting: *Deleted

## 2015-04-05 ENCOUNTER — Encounter: Payer: Self-pay | Admitting: Orthopedic Surgery

## 2015-04-05 ENCOUNTER — Ambulatory Visit (HOSPITAL_COMMUNITY)
Admission: RE | Admit: 2015-04-05 | Discharge: 2015-04-05 | Disposition: A | Discharge status: Home / Self Care | Payer: BLUE CROSS/BLUE SHIELD | Source: Ambulatory Visit | Attending: Orthopedic Surgery | Admitting: Orthopedic Surgery

## 2015-04-05 DIAGNOSIS — X58XXXA Exposure to other specified factors, initial encounter: Secondary | ICD-10-CM | POA: Diagnosis not present

## 2015-04-05 DIAGNOSIS — R937 Abnormal findings on diagnostic imaging of other parts of musculoskeletal system: Secondary | ICD-10-CM | POA: Insufficient documentation

## 2015-04-05 DIAGNOSIS — S83242A Other tear of medial meniscus, current injury, left knee, initial encounter: Secondary | ICD-10-CM | POA: Insufficient documentation

## 2015-04-05 DIAGNOSIS — M25562 Pain in left knee: Secondary | ICD-10-CM | POA: Diagnosis present

## 2015-04-05 DIAGNOSIS — M25462 Effusion, left knee: Secondary | ICD-10-CM | POA: Diagnosis not present

## 2015-04-05 MED ORDER — GABAPENTIN 100 MG PO CAPS: 100.0000 mg | ORAL_CAPSULE | Freq: Three times a day (TID) | ORAL | Status: DC

## 2015-04-05 NOTE — Telephone Encounter (Signed)
Give her a 2 week OOW note

## 2015-04-05 NOTE — Telephone Encounter (Signed)
Routing to Dr Harrison 

## 2015-04-05 NOTE — Telephone Encounter (Signed)
Med sent, patient aware, requests work note, states she does not feel as though she can continue working with the pain

## 2015-04-05 NOTE — Telephone Encounter (Signed)
Called patient, not available, left message to call office

## 2015-04-05 NOTE — Telephone Encounter (Signed)
Put her 100 mg of gabapentin tid # 90

## 2015-04-05 NOTE — Telephone Encounter (Signed)
Please provide and advise her

## 2015-04-05 NOTE — Telephone Encounter (Signed)
Called back to patient; left message to notify note ready for pick up.

## 2015-04-09 ENCOUNTER — Other Ambulatory Visit: Payer: Self-pay | Admitting: *Deleted

## 2015-04-09 ENCOUNTER — Encounter: Payer: Self-pay | Admitting: Orthopedic Surgery

## 2015-04-09 ENCOUNTER — Telehealth: Payer: Self-pay | Admitting: Orthopedic Surgery

## 2015-04-09 NOTE — Telephone Encounter (Signed)
Patient called to request results of her MRI.  Cell ph# is [phone]; alternate ph# is [phone].

## 2015-04-09 NOTE — Progress Notes (Unsigned)
IMPRESSION: 1. Irregular degenerative radial tear involving the posterior horn of the medial meniscus near the meniscal root. 2. Moderately advanced tricompartmental degenerative changes for age. Potential small superimposed subchondral insufficiency fracture involving the lateral femoral condyle. 3. Moderate-sized joint effusion with intercondylar notch and anterolateral soft tissue edema. 4. Intact lateral meniscus, cruciate and collateral ligaments.     Electronically Signed   By: Carey BullocksWilliam  Veazey M.D.   On: 04/05/2015 13:49   Surgery vs. No surgery   We decided that we would do arthroscopic surgery into a partial medial meniscectomy debridement for arthritis we'll do it on August 5 and her work note will be extended to September 6

## 2015-04-10 ENCOUNTER — Encounter: Payer: Self-pay | Admitting: Orthopedic Surgery

## 2015-04-19 ENCOUNTER — Telehealth: Payer: Self-pay | Admitting: Orthopedic Surgery

## 2015-04-19 NOTE — Telephone Encounter (Signed)
Regarding out-patient surgery scheduled at [facility], 05/04/15 CPT 29881/29880, contacted insurer [facility]; per Paige B, no pre-authorization is required.  Ref#[id number], 04/19/15, approximate time 11:18a.m.

## 2015-04-25 NOTE — Patient Instructions (Signed)
BINTA STATZER  04/25/2015     @   Your procedure is scheduled on   05/04/2015   Report to [facility] at  800  A.M.  Call this number if you have problems the morning of surgery:  [phone]   Remember:  Do not eat food or drink liquids after midnight.  Take these medicines the morning of surgery with A SIP OF WATER voltaren, neurontin, norco, synthroid   Do not wear jewelry, make-up or nail polish.  Do not wear lotions, powders, or perfumes.    Do not shave 48 hours prior to surgery.  Men may shave face and neck.  Do not bring valuables to the hospital.  [facility] is not responsible for any belongings or valuables.  Contacts, dentures or bridgework may not be worn into surgery.  Leave your suitcase in the car.  After surgery it may be brought to your room.  For patients admitted to the hospital, discharge time will be determined by your treatment team.  Patients discharged the day of surgery will not be allowed to drive home.   Name and phone number of your driver:   family Special instructions:  none  Please read over the following fact sheets that you were given. Pain Booklet, Coughing and Deep Breathing, Surgical Site Infection Prevention, Anesthesia Post-op Instructions and Care and Recovery After Surgery      Meniscus Injury, Arthroscopy Arthroscopy is a surgical procedure that involves the use of a small scope that has a camera and surgical instruments on the end (arthroscope). An arthroscope can be used to repair your meniscus injury.  LET [facility] CARE PROVIDER KNOW ABOUT:  Any allergies you have.  All medicines you are taking, including vitamins, herbs, eyedrops, creams, and over-the-counter medicines.  Any recent colds or infections you have had or currently have.  Previous problems you or members of your family have had with the use of anesthetics.  Any blood disorders or blood clotting problems you have.  Previous  surgeries you have had.  Medical conditions you have. RISKS AND COMPLICATIONS Generally, this is a safe procedure. However, as with any procedure, problems can occur. Possible problems include:  Damage to nerves or blood vessels.  Excess bleeding.  Blood clots.  Infection. BEFORE THE PROCEDURE  Do not eat or drink for 6-8 hours before the procedure.  Take medicines as directed by your surgeon. Ask your surgeon about changing or stopping your regular medicines.  You may have lab tests the morning of surgery. PROCEDURE  You will be given one of the following:   A medicine that numbs the area (local anesthesia).  A medicine that makes you go to sleep (general anesthesia).  A medicine injected into your spine that numbs your body below the waist (spinal anesthesia). Most often, several small cuts (incisions) are made in the knee. The arthroscope and instruments go into the incisions to repair the damage. The torn portion of the meniscus is removed.  During this time, your surgeon may find a partial or complete tear in a cruciate ligament, such as the anterior cruciate ligament (ACL). A completely torn cruciate ligament is reconstructed by taking tissue from another part of the body (grafting) and placing it into the injured area. This requires several larger incisions to complete the repair. Sometimes, open surgery is needed for collateral ligament injuries. If a collateral ligament is found to be injured, your surgeon may staple or suture the tear through  a slightly larger incision on the side of the knee. AFTER THE PROCEDURE You will be taken to the recovery area where your progress will be monitored. When you are awake, stable, and taking fluids without complications, you will be allowed to go home. This is usually the same day. However, more extensive repairs of a ligament may require an overnight stay.  The recovery time after repairing your meniscus or ligament depends on the amount  of damage to these structures. It also depends on whether or not reconstructive knee surgery was needed.   A torn or stretched ligament (ligament sprain) may take 6-8 weeks to heal. It takes about the same amount of time if your surgeon removed a torn meniscus.  A repaired meniscus may require 6-12 weeks of recovery time.  A torn ligament needing reconstructive surgery may take 6-12 months to heal fully. Document Released: 09/05/2000 Document Revised: 09/13/2013 Document Reviewed: 02/04/2013 [facility] Patient Information 2015 [place], [place]. This information is not intended to replace advice given to you by your health care provider. Make sure you discuss any questions you have with your health care provider. PATIENT INSTRUCTIONS POST-ANESTHESIA  IMMEDIATELY FOLLOWING SURGERY:  Do not drive or operate machinery for the first twenty four hours after surgery.  Do not make any important decisions for twenty four hours after surgery or while taking narcotic pain medications or sedatives.  If you develop intractable nausea and vomiting or a severe headache please notify your doctor immediately.  FOLLOW-UP:  Please make an appointment with your surgeon as instructed. You do not need to follow up with anesthesia unless specifically instructed to do so.  WOUND CARE INSTRUCTIONS (if applicable):  Keep a dry clean dressing on the anesthesia/puncture wound site if there is drainage.  Once the wound has quit draining you may leave it open to air.  Generally you should leave the bandage intact for twenty four hours unless there is drainage.  If the epidural site drains for more than 36-48 hours please call the anesthesia department.  QUESTIONS?:  Please feel free to call your physician or the hospital operator if you have any questions, and they will be happy to assist you.

## 2015-04-30 ENCOUNTER — Other Ambulatory Visit: Payer: Self-pay

## 2015-04-30 ENCOUNTER — Encounter (HOSPITAL_COMMUNITY)
Admission: RE | Admit: 2015-04-30 | Discharge: 2015-04-30 | Disposition: A | Discharge status: Home / Self Care | Payer: BLUE CROSS/BLUE SHIELD | Source: Ambulatory Visit | Attending: Orthopedic Surgery | Admitting: Orthopedic Surgery

## 2015-04-30 ENCOUNTER — Encounter (HOSPITAL_COMMUNITY): Payer: Self-pay

## 2015-04-30 DIAGNOSIS — S83249D Other tear of medial meniscus, current injury, unspecified knee, subsequent encounter: Secondary | ICD-10-CM | POA: Insufficient documentation

## 2015-04-30 DIAGNOSIS — Z01818 Encounter for other preprocedural examination: Secondary | ICD-10-CM | POA: Diagnosis not present

## 2015-04-30 HISTORY — DX: Hypothyroidism, unspecified: E03.9

## 2015-04-30 HISTORY — DX: Unspecified osteoarthritis, unspecified site: M19.90

## 2015-04-30 HISTORY — DX: Sleep apnea, unspecified: G47.30

## 2015-04-30 LAB — BASIC METABOLIC PANEL
Anion gap: 7 (ref 5–15)
BUN: 17 mg/dL (ref 6–20)
CO2: 29 mmol/L (ref 22–32)
CREATININE: 0.64 mg/dL (ref 0.44–1.00)
Calcium: 8.7 mg/dL — ABNORMAL LOW (ref 8.9–10.3)
Chloride: 103 mmol/L (ref 101–111)
GFR calc Af Amer: >60 mL/min (ref >60)
GLUCOSE: 93 mg/dL (ref 65–99)
Potassium: 3.5 mmol/L (ref 3.5–5.1)
Sodium: 139 mmol/L (ref 135–145)

## 2015-04-30 LAB — CBC WITH DIFFERENTIAL/PLATELET
Basophils Absolute: 0 10*3/uL (ref 0.0–0.1)
Basophils Relative: 0 % (ref 0–1)
Eosinophils Absolute: 0.2 10*3/uL (ref 0.0–0.7)
Eosinophils Relative: 2 % (ref 0–5)
HEMATOCRIT: 39.7 % (ref 36.0–46.0)
Hemoglobin: 13.6 g/dL (ref 12.0–15.0)
Lymphocytes Relative: 20 % (ref 12–46)
Lymphs Abs: 1.9 10*3/uL (ref 0.7–4.0)
MCH: 30.1 pg (ref 26.0–34.0)
MCHC: 34.3 g/dL (ref 30.0–36.0)
MCV: 87.8 fL (ref 78.0–100.0)
MONOS PCT: 8 % (ref 3–12)
Monocytes Absolute: 0.8 10*3/uL (ref 0.1–1.0)
Neutro Abs: 6.9 10*3/uL (ref 1.7–7.7)
Neutrophils Relative %: 70 % (ref 43–77)
PLATELETS: 204 10*3/uL (ref 150–400)
RBC: 4.52 MIL/uL (ref 3.87–5.11)
RDW: 13.3 % (ref 11.5–15.5)
WBC: 9.8 10*3/uL (ref 4.0–10.5)

## 2015-05-03 NOTE — H&P (Signed)
Expand All Collapse All   Patient ID: [name], female   DOB: 12/09/1965, 49 y.o.   MRN: [id number] New problem   Chief Complaint   Patient presents with   .  Knee Pain       left knee pain, initially referred by fusco      [name] is a 49 y.o. female.   HPI This 49 year old female presented with atraumatic onset of swelling and pain in her left knee with no history of trauma. She had symptoms for several weeks went to urgent care x-rays were obtained and laboratory studies were obtained which revealed no evidence of infection. Her x-rays show osteoarthritis mild  She eventually had an MRI which show she had medial meniscal tear along with her arthritis  She presents for arthroscopic medial meniscectomy of the left knee  Mechanical symptoms include locking, giving out. She reports stiffness and swelling.  She rates her pain a 10 and she says the pain is constant  Prior treatment diclofenac, prednisone Dosepak, 2 intramuscular injections of steroid-induced and Norco along with rest and she still has 10 out of 10 pain  Review of systems night sweats and fatigue COPD intermittent nausea lightheadedness rashes on her arms chronic back pain. Review of Systems See hpi    Past Medical History   Diagnosis  Date   .  COPD (chronic obstructive pulmonary disease)     .  Hypertension     .  Thyroid disease         Past Surgical History   Procedure  Laterality  Date   .  Nasal sinus surgery           Family History   Problem  Relation  Age of Onset   .  Cancer  Mother     .  Cancer  Father     .  Diabetes  Brother     .  Heart disease  Brother     .  Heart attack  Brother     .  Cancer  Brother         melanoma     Social History History   Substance Use Topics   .  Smoking status:  Never Smoker    .  Smokeless tobacco:  Never Used   .  Alcohol Use:  No       Allergies   Allergen  Reactions   .  Penicillins  Other (See Comments)       Yeast infection        Current Outpatient Prescriptions   Medication  Sig  Dispense  Refill   .  budesonide-formoterol (SYMBICORT) 160-4.5 MCG/ACT inhaler  Inhale 2 puffs into the lungs daily.       .  diclofenac (VOLTAREN) 75 MG EC tablet  Take 75 mg by mouth 2 (two) times daily.       .  hydrochlorothiazide (HYDRODIURIL) 25 MG tablet  Take 25 mg by mouth daily.       Marland Kitchen  HYDROcodone-acetaminophen (NORCO/VICODIN) 5-325 MG per tablet  Take 1 tablet by mouth every 6 (six) hours as needed for moderate pain.       Marland Kitchen  levothyroxine (SYNTHROID, LEVOTHROID) 25 MCG tablet  Take 25 mcg by mouth daily before breakfast.          No current facility-administered medications for this visit.        Physical Exam Blood pressure 144/86, height 5\' 3"  (1.6 m), weight 220 lb (  99.791 kg), last menstrual period 04/14/2011. Physical Exam The patient is well developed well nourished and well groomed. Orientation to person place and time is normal   Mood is pleasant. Ambulatory status she does walk but she is walking with a limp she does not have a supportive device. Left knee knee flexion is 100. She does not come to full extension. She has an effusion. The knee feels stable. She has normal muscle tone. Provocative tests for McMurray sign include medial joint tenderness and positive McMurray's.  The right knee flexion is 110. Full extension. Neurovascular exam is intact in both legs.   Data Reviewed X-ray disc from urgent care along with the report and I agree with the report that there is mild arthritis in the knee joint  She had a sedimentation rate which was 12 uric acid 15 rheumatoid factor was 15 with a normal being 14 ANA test was negative  MRI report IMPRESSION: 1. Irregular degenerative radial tear involving the posterior horn of the medial meniscus near the meniscal root. 2. Moderately advanced tricompartmental degenerative changes for age. Potential small superimposed subchondral insufficiency fracture involving  the lateral femoral condyle. 3. Moderate-sized joint effusion with intercondylar notch and anterolateral soft tissue edema. 4. Intact lateral meniscus, cruciate and collateral ligaments.     Electronically Signed   By: Carey Bullocks M.D.   On: 04/05/2015 13:49   Assessment torn medial meniscus left knee with advanced arthritis left knee large effusion  Plan for arthroscopic medial meniscectomy left knee

## 2015-05-04 ENCOUNTER — Encounter (HOSPITAL_COMMUNITY): Payer: Self-pay | Admitting: *Deleted

## 2015-05-04 ENCOUNTER — Ambulatory Visit (HOSPITAL_COMMUNITY): Payer: BLUE CROSS/BLUE SHIELD | Admitting: Anesthesiology

## 2015-05-04 ENCOUNTER — Encounter (HOSPITAL_COMMUNITY)
Admission: RE | Disposition: A | Discharge status: Home / Self Care | Payer: Self-pay | Source: Ambulatory Visit | Attending: Orthopedic Surgery

## 2015-05-04 ENCOUNTER — Ambulatory Visit (HOSPITAL_COMMUNITY)
Admission: RE | Admit: 2015-05-04 | Discharge: 2015-05-04 | Disposition: A | Discharge status: Home / Self Care | Payer: BLUE CROSS/BLUE SHIELD | Source: Ambulatory Visit | Attending: Orthopedic Surgery | Admitting: Orthopedic Surgery

## 2015-05-04 DIAGNOSIS — I10 Essential (primary) hypertension: Secondary | ICD-10-CM | POA: Diagnosis not present

## 2015-05-04 DIAGNOSIS — E039 Hypothyroidism, unspecified: Secondary | ICD-10-CM | POA: Diagnosis not present

## 2015-05-04 DIAGNOSIS — S83282A Other tear of lateral meniscus, current injury, left knee, initial encounter: Secondary | ICD-10-CM | POA: Diagnosis not present

## 2015-05-04 DIAGNOSIS — M65862 Other synovitis and tenosynovitis, left lower leg: Secondary | ICD-10-CM | POA: Diagnosis not present

## 2015-05-04 DIAGNOSIS — S83242A Other tear of medial meniscus, current injury, left knee, initial encounter: Secondary | ICD-10-CM | POA: Diagnosis not present

## 2015-05-04 DIAGNOSIS — M25462 Effusion, left knee: Secondary | ICD-10-CM | POA: Insufficient documentation

## 2015-05-04 DIAGNOSIS — M23201 Derangement of unspecified lateral meniscus due to old tear or injury, left knee: Secondary | ICD-10-CM | POA: Insufficient documentation

## 2015-05-04 DIAGNOSIS — M23222 Derangement of posterior horn of medial meniscus due to old tear or injury, left knee: Secondary | ICD-10-CM | POA: Diagnosis not present

## 2015-05-04 DIAGNOSIS — J449 Chronic obstructive pulmonary disease, unspecified: Secondary | ICD-10-CM | POA: Diagnosis not present

## 2015-05-04 DIAGNOSIS — M2242 Chondromalacia patellae, left knee: Secondary | ICD-10-CM | POA: Diagnosis not present

## 2015-05-04 DIAGNOSIS — S83289A Other tear of lateral meniscus, current injury, unspecified knee, initial encounter: Secondary | ICD-10-CM | POA: Insufficient documentation

## 2015-05-04 DIAGNOSIS — S83249A Other tear of medial meniscus, current injury, unspecified knee, initial encounter: Secondary | ICD-10-CM | POA: Insufficient documentation

## 2015-05-04 DIAGNOSIS — M1712 Unilateral primary osteoarthritis, left knee: Secondary | ICD-10-CM | POA: Diagnosis not present

## 2015-05-04 DIAGNOSIS — G4730 Sleep apnea, unspecified: Secondary | ICD-10-CM | POA: Insufficient documentation

## 2015-05-04 DIAGNOSIS — Z880 Allergy status to penicillin: Secondary | ICD-10-CM | POA: Diagnosis not present

## 2015-05-04 HISTORY — PX: KNEE ARTHROSCOPY WITH MEDIAL MENISECTOMY: SHX5651

## 2015-05-04 SURGERY — ARTHROSCOPY, KNEE, WITH MEDIAL MENISCECTOMY
Anesthesia: General | Site: Knee | Laterality: Left

## 2015-05-04 MED ORDER — BUPIVACAINE-EPINEPHRINE (PF) 0.5% -1:200000 IJ SOLN
INTRAMUSCULAR | Status: DC | PRN
  Administered 2015-05-04: 60 mL via PERINEURAL

## 2015-05-04 MED ORDER — PROMETHAZINE HCL 12.5 MG PO TABS
12.5000 mg | ORAL_TABLET | Freq: Four times a day (QID) | ORAL | Status: DC | PRN
Start: 2015-05-04 — End: 2015-05-29

## 2015-05-04 MED ORDER — PROPOFOL 10 MG/ML IV BOLUS
INTRAVENOUS | Status: DC | PRN
  Administered 2015-05-04: 140 mg via INTRAVENOUS

## 2015-05-04 MED ORDER — VANCOMYCIN HCL IN DEXTROSE 1-5 GM/200ML-% IV SOLN
1000.0000 mg | INTRAVENOUS | Status: AC
  Administered 2015-05-04: 1000 mg via INTRAVENOUS

## 2015-05-04 MED ORDER — CHLORHEXIDINE GLUCONATE 4 % EX LIQD: 60.0000 mL | Freq: Once | CUTANEOUS | Status: DC

## 2015-05-04 MED ORDER — LIDOCAINE HCL (PF) 1 % IJ SOLN
INTRAMUSCULAR | Status: AC
  Filled 2015-05-04: qty 5

## 2015-05-04 MED ORDER — EPINEPHRINE HCL 1 MG/ML IJ SOLN
INTRAMUSCULAR | Status: AC
  Filled 2015-05-04: qty 3

## 2015-05-04 MED ORDER — LIDOCAINE HCL (CARDIAC) 10 MG/ML IV SOLN
INTRAVENOUS | Status: DC | PRN
  Administered 2015-05-04: 50 mg via INTRAVENOUS

## 2015-05-04 MED ORDER — SODIUM CHLORIDE 0.9 % IR SOLN
Status: DC | PRN
  Administered 2015-05-04: 1000 mL

## 2015-05-04 MED ORDER — FENTANYL CITRATE (PF) 100 MCG/2ML IJ SOLN
25.0000 ug | INTRAMUSCULAR | Status: AC
  Administered 2015-05-04 (×2): 25 ug via INTRAVENOUS

## 2015-05-04 MED ORDER — ONDANSETRON HCL 4 MG/2ML IJ SOLN
4.0000 mg | Freq: Once | INTRAMUSCULAR | Status: AC
  Administered 2015-05-04: 4 mg via INTRAVENOUS
  Filled 2015-05-04: qty 2

## 2015-05-04 MED ORDER — KETOROLAC TROMETHAMINE 30 MG/ML IJ SOLN
30.0000 mg | Freq: Once | INTRAMUSCULAR | Status: AC
  Administered 2015-05-04: 30 mg via INTRAVENOUS
  Filled 2015-05-04: qty 1

## 2015-05-04 MED ORDER — HYDROCODONE-ACETAMINOPHEN 5-325 MG PO TABS
2.0000 | ORAL_TABLET | Freq: Once | ORAL | Status: AC
  Administered 2015-05-04: 2 via ORAL
  Filled 2015-05-04: qty 2

## 2015-05-04 MED ORDER — VANCOMYCIN HCL IN DEXTROSE 1-5 GM/200ML-% IV SOLN
INTRAVENOUS | Status: AC
  Filled 2015-05-04: qty 200

## 2015-05-04 MED ORDER — SODIUM CHLORIDE 0.9 % IR SOLN
Status: DC | PRN
  Administered 2015-05-04 (×2): 3000 mL

## 2015-05-04 MED ORDER — ONDANSETRON HCL 4 MG/2ML IJ SOLN: 4.0000 mg | Freq: Once | INTRAMUSCULAR | Status: DC | PRN

## 2015-05-04 MED ORDER — FENTANYL CITRATE (PF) 250 MCG/5ML IJ SOLN
INTRAMUSCULAR | Status: AC
  Filled 2015-05-04: qty 25

## 2015-05-04 MED ORDER — MIDAZOLAM HCL 2 MG/2ML IJ SOLN
1.0000 mg | INTRAMUSCULAR | Status: DC | PRN
  Administered 2015-05-04: 2 mg via INTRAVENOUS
  Administered 2015-05-04: 1 mg via INTRAVENOUS
  Filled 2015-05-04: qty 2

## 2015-05-04 MED ORDER — FENTANYL CITRATE (PF) 100 MCG/2ML IJ SOLN: 25.0000 ug | INTRAMUSCULAR | Status: DC | PRN

## 2015-05-04 MED ORDER — GLYCOPYRROLATE 0.2 MG/ML IJ SOLN
0.2000 mg | Freq: Once | INTRAMUSCULAR | Status: AC
  Administered 2015-05-04: 0.2 mg via INTRAVENOUS

## 2015-05-04 MED ORDER — ONDANSETRON HCL 4 MG/2ML IJ SOLN
4.0000 mg | Freq: Once | INTRAMUSCULAR | Status: AC
  Administered 2015-05-04: 4 mg via INTRAVENOUS

## 2015-05-04 MED ORDER — FENTANYL CITRATE (PF) 100 MCG/2ML IJ SOLN
INTRAMUSCULAR | Status: AC
  Filled 2015-05-04: qty 2

## 2015-05-04 MED ORDER — FENTANYL CITRATE (PF) 100 MCG/2ML IJ SOLN
INTRAMUSCULAR | Status: DC | PRN
  Administered 2015-05-04 (×2): 50 ug via INTRAVENOUS
  Administered 2015-05-04 (×3): 25 ug via INTRAVENOUS
  Administered 2015-05-04: 50 ug via INTRAVENOUS
  Administered 2015-05-04: 25 ug via INTRAVENOUS

## 2015-05-04 MED ORDER — GLYCOPYRROLATE 0.2 MG/ML IJ SOLN
INTRAMUSCULAR | Status: AC
  Filled 2015-05-04: qty 1

## 2015-05-04 MED ORDER — DEXTROSE 5 % IV SOLN
INTRAVENOUS | Status: DC | PRN
  Administered 2015-05-04: 10:00:00 via INTRAVENOUS

## 2015-05-04 MED ORDER — LACTATED RINGERS IV SOLN
INTRAVENOUS | Status: DC
  Administered 2015-05-04 (×2): via INTRAVENOUS

## 2015-05-04 MED ORDER — MIDAZOLAM HCL 2 MG/2ML IJ SOLN
INTRAMUSCULAR | Status: AC
  Filled 2015-05-04: qty 2

## 2015-05-04 MED ORDER — ONDANSETRON HCL 4 MG/2ML IJ SOLN
INTRAMUSCULAR | Status: AC
  Filled 2015-05-04: qty 2

## 2015-05-04 MED ORDER — HYDROCODONE-ACETAMINOPHEN 10-325 MG PO TABS: 1.0000 | ORAL_TABLET | ORAL | Status: DC | PRN

## 2015-05-04 MED ORDER — BUPIVACAINE-EPINEPHRINE (PF) 0.5% -1:200000 IJ SOLN
INTRAMUSCULAR | Status: AC
  Filled 2015-05-04: qty 60

## 2015-05-04 SURGICAL SUPPLY — 58 items
ARTHROWAND PARAGON T2 (SURGICAL WAND)
BAG HAMPER (MISCELLANEOUS) ×2 IMPLANT
BANDAGE ELASTIC 6 VELCRO NS (GAUZE/BANDAGES/DRESSINGS) ×2 IMPLANT
BLADE AGGRESSIVE PLUS 4.0 (BLADE) ×1 IMPLANT
BLADE SURG SZ11 CARB STEEL (BLADE) ×1 IMPLANT
BUR AGGRESSIVE PLUS 5.0 (BURR) ×1 IMPLANT
CHLORAPREP W/TINT 26ML (MISCELLANEOUS) ×4 IMPLANT
CLOTH BEACON ORANGE TIMEOUT ST (SAFETY) ×2 IMPLANT
COOLER CRYO IC GRAV AND TUBE (ORTHOPEDIC SUPPLIES) ×2 IMPLANT
CUFF CRYO KNEE LG 20X31 COOLER (ORTHOPEDIC SUPPLIES) ×1 IMPLANT
CUFF CRYO KNEE18X23 MED (MISCELLANEOUS) IMPLANT
CUFF TOURNIQUET SINGLE 34IN LL (TOURNIQUET CUFF) ×1 IMPLANT
CUFF TOURNIQUET SINGLE 44IN (TOURNIQUET CUFF) IMPLANT
CUTTER ANGLED DBL BITE 4.5 (BURR) IMPLANT
DECANTER SPIKE VIAL GLASS SM (MISCELLANEOUS) ×4 IMPLANT
GAUZE SPONGE 4X4 12PLY STRL (GAUZE/BANDAGES/DRESSINGS) ×2 IMPLANT
GAUZE SPONGE 4X4 16PLY XRAY LF (GAUZE/BANDAGES/DRESSINGS) ×2 IMPLANT
GAUZE XEROFORM 5X9 LF (GAUZE/BANDAGES/DRESSINGS) ×2 IMPLANT
GLOVE BIOGEL PI IND STRL 7.0 (GLOVE) IMPLANT
GLOVE BIOGEL PI INDICATOR 7.0 (GLOVE) ×1
GLOVE ECLIPSE 6.5 STRL STRAW (GLOVE) ×1 IMPLANT
GLOVE EXAM NITRILE MD LF STRL (GLOVE) ×1 IMPLANT
GLOVE SKINSENSE NS SZ8.0 LF (GLOVE) ×1
GLOVE SKINSENSE STRL SZ8.0 LF (GLOVE) ×1 IMPLANT
GLOVE SS N UNI LF 8.5 STRL (GLOVE) ×2 IMPLANT
GOWN STRL REUS W/ TWL LRG LVL3 (GOWN DISPOSABLE) ×1 IMPLANT
GOWN STRL REUS W/TWL LRG LVL3 (GOWN DISPOSABLE) ×2
GOWN STRL REUS W/TWL XL LVL3 (GOWN DISPOSABLE) ×2 IMPLANT
HLDR LEG FOAM (MISCELLANEOUS) ×1 IMPLANT
IV NS IRRIG 3000ML ARTHROMATIC (IV SOLUTION) ×4 IMPLANT
KIT BLADEGUARD II DBL (SET/KITS/TRAYS/PACK) ×2 IMPLANT
KIT ROOM TURNOVER AP CYSTO (KITS) ×2 IMPLANT
LEG HOLDER FOAM (MISCELLANEOUS) ×1
MANIFOLD NEPTUNE II (INSTRUMENTS) ×2 IMPLANT
MARKER SKIN DUAL TIP RULER LAB (MISCELLANEOUS) ×2 IMPLANT
NDL HYPO 18GX1.5 BLUNT FILL (NEEDLE) ×1 IMPLANT
NDL HYPO 21X1.5 SAFETY (NEEDLE) ×1 IMPLANT
NDL SPNL 18GX3.5 QUINCKE PK (NEEDLE) ×1 IMPLANT
NEEDLE HYPO 18GX1.5 BLUNT FILL (NEEDLE) ×2 IMPLANT
NEEDLE HYPO 21X1.5 SAFETY (NEEDLE) ×2 IMPLANT
NEEDLE SPNL 18GX3.5 QUINCKE PK (NEEDLE) ×2 IMPLANT
NS IRRIG 1000ML POUR BTL (IV SOLUTION) ×2 IMPLANT
PACK ARTHRO LIMB DRAPE STRL (MISCELLANEOUS) ×2 IMPLANT
PAD ABD 5X9 TENDERSORB (GAUZE/BANDAGES/DRESSINGS) ×2 IMPLANT
PAD ARMBOARD 7.5X6 YLW CONV (MISCELLANEOUS) ×2 IMPLANT
PADDING CAST COTTON 6X4 STRL (CAST SUPPLIES) ×2 IMPLANT
PADDING WEBRIL 6 STERILE (GAUZE/BANDAGES/DRESSINGS) ×1 IMPLANT
SET ARTHROSCOPY INST (INSTRUMENTS) ×2 IMPLANT
SET ARTHROSCOPY PUMP TUBE (IRRIGATION / IRRIGATOR) ×2 IMPLANT
SET BASIN LINEN APH (SET/KITS/TRAYS/PACK) ×2 IMPLANT
SPONGE GAUZE 4X4 12PLY (GAUZE/BANDAGES/DRESSINGS) ×1 IMPLANT
SUT ETHILON 3 0 FSL (SUTURE) ×1 IMPLANT
SYR 30ML LL (SYRINGE) ×2 IMPLANT
SYRINGE 10CC LL (SYRINGE) ×2 IMPLANT
WAND 50 DEG COVAC W/CORD (SURGICAL WAND) ×1 IMPLANT
WAND 90 DEG TURBOVAC W/CORD (SURGICAL WAND) IMPLANT
WAND ARTHRO PARAGON T2 (SURGICAL WAND) IMPLANT
YANKAUER SUCT BULB TIP 10FT TU (MISCELLANEOUS) ×7 IMPLANT

## 2015-05-04 NOTE — Interval H&P Note (Signed)
History and Physical Interval Note:  05/04/2015 9:42 AM  [name]  has presented today for surgery, with the diagnosis of LEFT MENISCAL TEAR  The various methods of treatment have been discussed with the patient and family. After consideration of risks, benefits and other options for treatment, the patient has consented to  Procedure(s): KNEE ARTHROSCOPY WITH MEDIAL MENISECTOMY (Left) as a surgical intervention .  The patient's history has been reviewed, patient examined, no change in status, stable for surgery.  I have reviewed the patient's chart and labs.  Questions were answered to the patient's satisfaction.     [name]

## 2015-05-04 NOTE — Anesthesia Preprocedure Evaluation (Signed)
Anesthesia Evaluation  Patient identified by MRN, date of birth, ID band Patient awake    Reviewed: Allergy & Precautions, NPO status , Patient's Chart, lab work & pertinent test results  Airway Mallampati: II  TM Distance: >3 FB     Dental  (+) Teeth Intact   Pulmonary sleep apnea , COPD breath sounds clear to auscultation        Cardiovascular hypertension, Pt. on medications Rhythm:Regular Rate:Normal     Neuro/Psych    GI/Hepatic negative GI ROS,   Endo/Other  Hypothyroidism   Renal/GU      Musculoskeletal   Abdominal   Peds  Hematology   Anesthesia Other Findings   Reproductive/Obstetrics                             Anesthesia Physical Anesthesia Plan  ASA: III  Anesthesia Plan: General   Post-op Pain Management:    Induction: Intravenous  Airway Management Planned: LMA  Additional Equipment:   Intra-op Plan:   Post-operative Plan: Extubation in OR  Informed Consent: I have reviewed the patients History and Physical, chart, labs and discussed the procedure including the risks, benefits and alternatives for the proposed anesthesia with the patient or authorized representative who has indicated his/her understanding and acceptance.     Plan Discussed with:   Anesthesia Plan Comments:         Anesthesia Quick Evaluation

## 2015-05-04 NOTE — Anesthesia Postprocedure Evaluation (Signed)
Late Entry Anesthesia Post-op Note  Patient: [name]  Procedure(s) Performed: Procedure(s): KNEE ARTHROSCOPY WITH MEDIAL AND LATERAL MENISECTOMY (Left)  Patient Location: PACU  Anesthesia Type:General  Level of Consciousness: awake, alert , oriented and patient cooperative  Airway and Oxygen Therapy: Patient Spontanous Breathing  Post-op Pain: none  Post-op Assessment: Post-op Vital signs reviewed, Patient's Cardiovascular Status Stable, Respiratory Function Stable, Patent Airway, No signs of Nausea or vomiting and Pain level controlled              Post-op Vital Signs: Reviewed and stable  Last Vitals:  Filed Vitals:   05/04/15 1110  BP: 131/72  Pulse:   Temp: 36.4 C  Resp:     Complications: No apparent anesthesia complications

## 2015-05-04 NOTE — Transfer of Care (Signed)
Immediate Anesthesia Transfer of Care Note  Patient: [name]  Procedure(s) Performed: Procedure(s): KNEE ARTHROSCOPY WITH MEDIAL AND LATERAL MENISECTOMY (Left)  Patient Location: PACU  Anesthesia Type:General  Level of Consciousness: awake, oriented and patient cooperative  Airway & Oxygen Therapy: Patient Spontanous Breathing and Patient connected to face mask oxygen  Post-op Assessment: Report given to RN and Post -op Vital signs reviewed and stable  Post vital signs: Reviewed and stable  Last Vitals:  Filed Vitals:   05/04/15 1000  BP: 108/72  Pulse:   Temp:   Resp: 15    Complications: No apparent anesthesia complications

## 2015-05-04 NOTE — Op Note (Signed)
05/04/2015  11:00 AM  PATIENT:  [name]  49 y.o. female  PRE-OPERATIVE DIAGNOSIS:  LEFT medial MENISCAL TEAR  POST-OPERATIVE DIAGNOSIS:  LEFT MEDIAL AND LATERAL MENISCAL TEAR  PROCEDURE:  Procedure(s): KNEE ARTHROSCOPY WITH MEDIAL AND LATERAL MENISECTOMY (Left) 29880  Indications the patient had left knee pain which was unresponsive to nonsurgical management and she opted for arthroscopic surgery to try to improve the function in her knee and decrease the pain.  Findings at surgery: She had extensive arthritis and synovitis of the knee joint. There is arthritic change in both the medial and lateral and patellofemoral compartments.  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  Chart review was completed surgical site was confirmed and marked as left knee  Patient was taken to surgery. After successful anesthesia the left leg was prepped and draped sterilely  Timeout procedure was executed and completed  A lateral portal was established in standard fashion the scope was placed into the left knee joint. Diagnostic arthroscopy was completed.  Attention was turned to the medial compartment where medial segment was performed with a motorized shaver and the meniscus was balanced until a stable rim was obtained using a 50 ArthroCare wand. This was confirmed visually and with the probe  We then turned our attention to the lateral compartment and a motorized shaver was used to debride the free edges of the torn lateral meniscus. Stable rim was confirmed with probe  The joint was irrigated on the wash mode. Portals were closed with 3-0 nylon. 60 mL of Marcaine  was injected into the joint  Sterile dressings were applied. Cryo/Cuff was applied and activated.  The patient was taken to the recovery room in stable condition SURGEON:  Surgeon(s) and Role:    * Finneus Kaneshiro E Emmily Pellegrin, MD - Primary  PHYSICIAN ASSISTANT:   ASSISTANTS: none   ANESTHESIA:   general  EBL:  Total I/O In: -  Out: 5 [Blood:5]  BLOOD ADMINISTERED:none  DRAINS: none   LOCAL MEDICATIONS USED:  MARCAINE     SPECIMEN:  No Specimen  DISPOSITION OF SPECIMEN:  N/A  COUNTS:  YES  TOURNIQUET:    DICTATION: .Dragon Dictation  PLAN OF CARE: Discharge to home after PACU  PATIENT DISPOSITION:  PACU - hemodynamically stable.   Delay start of Pharmacological VTE agent (>24hrs) due to surgical blood loss or risk of bleeding: not applicable  

## 2015-05-04 NOTE — Anesthesia Procedure Notes (Signed)
Procedure Name: LMA Insertion Date/Time: 05/04/2015 10:13 AM Performed by: Pernell Dupre, AMY A Pre-anesthesia Checklist: Patient identified, Timeout performed, Emergency Drugs available, Suction available and Patient being monitored Patient Re-evaluated:Patient Re-evaluated prior to inductionOxygen Delivery Method: Circle system utilized Preoxygenation: Pre-oxygenation with 100% oxygen Intubation Type: IV induction Ventilation: Mask ventilation without difficulty LMA: LMA inserted LMA Size: 4.0 Number of attempts: 1 Placement Confirmation: positive ETCO2 Tube secured with: Tape Dental Injury: Teeth and Oropharynx as per pre-operative assessment

## 2015-05-04 NOTE — Discharge Instructions (Signed)
Arthroscopy Arthroscopy is a procedure in which a caregiver uses an arthroscope. An arthroscope is an instrument that allows your caregiver to look directly into a joint. It is like a small telescope attached to a video camera and is similar in size to a pencil. Arthroscopes let your caregiver see inside your joint on an attached television monitor. Most joints in the human body can be examined and surgery can be performed through the arthroscope using small incisions. Prior to the use of arthroscopes, surgeries were done with larger open incisions, which require longer recovery times. On occasion, arthroscopic procedures result in complications such as bleeding, swelling, and pain. If a complication results, a longer recovery and rehabilitation may be required. INDICATIONS Arthroscopic procedures were developed to remove, repair, or replace (reconstruct) damaged tissue. Arthroscopy can be performed if the procedure involves trimming tissue, removing fragments of cartilage or bone (loose bodies) within joints, suctioning debris, biopsy of tissue, smoothing rough surfaces, removing inflamed tissue, shrinking tissue, or sewing (suturing), tacking, or stapling cartilage and ligaments. What can be done is dependent on many factors. Arthroscopy allows for surgeons to perform certain surgical procedures. Most of the surgeries you can go home the same day as the procedure (outpatient procedures) because the procedure does not cause as much trauma to the patient. Arthroscopy is a valuable diagnostic tool. Radiographs (such as X-ray and CT scans) have poor ability at showing soft tissue, whereas arthroscopy gives the caregiver direct visualization of soft tissue, cartilage, and bone. However, the emergence of magnetic resonance imaging (MRI) has lessened the need for arthroscopy as a diagnostic tool.  TECHNIQUE  Repair and reconstruction arthroscopic techniques may require additional and/or larger incisions than  diagnostic arthroscopy portals (-inch incisions). The procedures are often more extensive in repair and reconstruction than excision procedures. Therefore, the patient may need to stay in the hospital overnight after arthroscopic repair or reconstruction. These procedures also disrupt more tissue and discomfort may occur, so the temporary use of braces, casts, or crutches, as well as rehabilitation, may be needed.  In order to undergo an arthroscopic procedure, a complete evaluation is necessary in order to provide the caregiver with as accurate a diagnosis as possible. Sometimes it is necessary to perform diagnostic arthroscopy before another surgery can be scheduled.  Both diagnostic and surgical arthroscopy can be performed under local anesthesia (only the joint is numbed), regional anesthesia (the operative limb is numbed), spinal or epidural anesthesia (only the lower extremities are numbed), or general anesthesia (you are completely asleep). The type of anesthetic is dependent on the patient, the surgeon, and the procedure being performed.  If you ask prior to the operation, you may be able to obtain pictures or a video from the arthroscopic camera.  Do not eat or drink anything for at least 8 hours before surgery. Food and drinks (including coffee) make general anesthesia more hazardous. SEEK MEDICAL CARE IF:  You experience pain, numbness, or coldness in the extremity operated on.  Blue, gray, or dark color appears in the fingers or toenails.  You have increased pain, swelling, redness, drainage, or bleeding in the surgical area despite rest, ice, elevation, and pain medications.  You have signs of infection, including a fever 102F (38.9C) or higher. Document Released: 04/09/2005 Document Revised: 01/23/2014 Document Reviewed: 12/21/2008 [facility] Patient Information 2015 [place], [place]. This information is not intended to replace advice given to you by your health care provider. Make  sure you discuss any questions you have with your health care provider.

## 2015-05-04 NOTE — Brief Op Note (Signed)
05/04/2015  11:00 AM  PATIENT:  [name]  49 y.o. female  PRE-OPERATIVE DIAGNOSIS:  LEFT medial MENISCAL TEAR  POST-OPERATIVE DIAGNOSIS:  LEFT MEDIAL AND LATERAL MENISCAL TEAR  PROCEDURE:  Procedure(s): KNEE ARTHROSCOPY WITH MEDIAL AND LATERAL MENISECTOMY (Left) 29880  Indications the patient had left knee pain which was unresponsive to nonsurgical management and she opted for arthroscopic surgery to try to improve the function in her knee and decrease the pain.  Findings at surgery: She had extensive arthritis and synovitis of the knee joint. There is arthritic change in both the medial and lateral and patellofemoral compartments.  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  Chart review was completed surgical site was confirmed and marked as left knee  Patient was taken to surgery. After successful anesthesia the left leg was prepped and draped sterilely  Timeout procedure was executed and completed  A lateral portal was established in standard fashion the scope was placed into the left knee joint. Diagnostic arthroscopy was completed.  Attention was turned to the medial compartment where medial segment was performed with a motorized shaver and the meniscus was balanced until a stable rim was obtained using a 50 ArthroCare wand. This was confirmed visually and with the probe  We then turned our attention to the lateral compartment and a motorized shaver was used to debride the free edges of the torn lateral meniscus. Stable rim was confirmed with probe  The joint was irrigated on the wash mode. Portals were closed with 3-0 nylon. 60 mL of Marcaine  was injected into the joint  Sterile dressings were applied. Cryo/Cuff was applied and activated.  The patient was taken to the recovery room in stable condition SURGEON:  Surgeon(s) and Role:    * Vickki Hearing, MD - Primary  PHYSICIAN ASSISTANT:   ASSISTANTS: none   ANESTHESIA:   general  EBL:  Total I/O In: -  Out: 5 [Blood:5]  BLOOD ADMINISTERED:none  DRAINS: none   LOCAL MEDICATIONS USED:  MARCAINE     SPECIMEN:  No Specimen  DISPOSITION OF SPECIMEN:  N/A  COUNTS:  YES  TOURNIQUET:    DICTATION: .Dragon Dictation  PLAN OF CARE: Discharge to home after PACU  PATIENT DISPOSITION:  PACU - hemodynamically stable.   Delay start of Pharmacological VTE agent (>24hrs) due to surgical blood loss or risk of bleeding: not applicable

## 2015-05-07 ENCOUNTER — Ambulatory Visit (INDEPENDENT_AMBULATORY_CARE_PROVIDER_SITE_OTHER): Payer: BLUE CROSS/BLUE SHIELD | Admitting: Orthopedic Surgery

## 2015-05-07 ENCOUNTER — Encounter: Payer: Self-pay | Admitting: Orthopedic Surgery

## 2015-05-07 ENCOUNTER — Encounter (HOSPITAL_COMMUNITY): Payer: Self-pay | Admitting: Orthopedic Surgery

## 2015-05-07 VITALS — BP 114/67 | Ht 63.0 in | Wt 220.0 lb

## 2015-05-07 DIAGNOSIS — Z9889 Other specified postprocedural states: Secondary | ICD-10-CM

## 2015-05-07 MED ORDER — HYDROCODONE-ACETAMINOPHEN 7.5-325 MG PO TABS: 1.0000 | ORAL_TABLET | ORAL | Status: DC | PRN

## 2015-05-07 NOTE — Progress Notes (Signed)
Patient ID: DEOLA REWIS, female   DOB: [birth year], 49 y.o.   MRN: [id number]  Follow up visit  Chief Complaint  Patient presents with  . Follow-up    post op 1 SALK W/MM, DOS 05/04/15    BP 114/67 mmHg  Ht  (1.6 m)  Wt 220 lb (99.791 kg)  BMI 38.98 kg/m2  LMP 04/14/2011  Encounter Diagnosis  Name Primary?  . S/P left knee arthroscopy Yes    Postop visit #13 days after arthroscopy partial medial and lateral meniscectomy and debridement of chondral changes consistent with arthritis of all 3 compartments  She is in stable condition. Her incision sites look fine her sutures were removed. Her knee flexion is about 75  She is walking with support  Follow-up 3 weeks. Start the physical therapy  Continue out of work status.  Meds ordered this encounter  Medications  . HYDROcodone-acetaminophen (NORCO) 7.5-325 MG per tablet    Sig: Take 1 tablet by mouth every 4 (four) hours as needed for moderate pain.    Dispense:  74 tablet    Refill:  0

## 2015-05-07 NOTE — Patient Instructions (Signed)
CALL [facility] THER[facility] DEPT TO SCHEDULE THERAPY VISITS 

## 2015-05-09 ENCOUNTER — Ambulatory Visit (HOSPITAL_COMMUNITY): Payer: BLUE CROSS/BLUE SHIELD | Attending: Orthopedic Surgery | Admitting: Physical Therapy

## 2015-05-09 DIAGNOSIS — M25662 Stiffness of left knee, not elsewhere classified: Secondary | ICD-10-CM | POA: Diagnosis present

## 2015-05-09 DIAGNOSIS — Z9889 Other specified postprocedural states: Secondary | ICD-10-CM

## 2015-05-09 DIAGNOSIS — R262 Difficulty in walking, not elsewhere classified: Secondary | ICD-10-CM | POA: Diagnosis present

## 2015-05-09 DIAGNOSIS — R29898 Other symptoms and signs involving the musculoskeletal system: Secondary | ICD-10-CM | POA: Diagnosis present

## 2015-05-09 NOTE — Patient Instructions (Signed)
LONG ARC QUAD - LAQ - HIGH SEAT  While seated with your knee in a bent position, slowly straighten your knee as you raise your foot upwards as shown.  HIP ABDUCTION - SIDELYING  While lying on your side, slowly raise up your top leg to the side. Keep your knee straight and maintain your toes pointed forward the entire time.   The bottom leg can be bent to stabilize your body.    BRIDGING  While lying on your back, tighten your lower abdominals, squeeze your buttocks and then raise your buttocks off the floor/bed as creating a "Bridge" with your body.         STRAIGHT LEG RAISE - SLR  While lying or sitting, raise up your leg with a straight knee.  Keep the opposite knee bent with the foot planted to the ground.

## 2015-05-09 NOTE — Therapy (Signed)
[facility] [address] [place], [place], [address] Phone: [phone]   Fax:  [phone]  Physical Therapy Evaluation  Patient Details  Name: [name] MRN: [id number] Date of Birth: 1966/03/27 Referring Provider:  Vickki Hearing, MD  Encounter Date: 05/09/2015      PT End of Session - 05/09/15 1627    Visit Number 1   Number of Visits 12   Date for PT Re-Evaluation 06/06/15   Authorization Type BCBS   Authorization Time Period 06/06/15-07/04/15   PT Start Time 0845   PT Stop Time 0929   PT Time Calculation (min) 44 min   Activity Tolerance Patient tolerated treatment well   Behavior During Therapy [facility] for tasks assessed/performed      Past Medical History  Diagnosis Date  . COPD (chronic obstructive pulmonary disease)   . Hypertension   . Thyroid disease   . Sleep apnea     pt DX but did not want to use CPAP.  Marland Kitchen Hypothyroidism   . Arthritis     Past Surgical History  Procedure Laterality Date  . Nasal sinus surgery    . Knee arthroscopy with medial menisectomy Left 05/04/2015    Procedure: KNEE ARTHROSCOPY WITH MEDIAL AND LATERAL MENISECTOMY;  Surgeon: Vickki Hearing, MD;  Location: [facility];  Service: Orthopedics;  Laterality: Left;    There were no vitals filed for this visit.  Visit Diagnosis:  S/P left knee arthroscopy  Difficulty walking  Weakness of left leg  Stiffness of left knee      Subjective Assessment - 05/09/15 1622    Subjective Pt reports that she had knee arthroscopy done on 05/04/15. She has had the most difficulty with bending her knee when walking. She reports that she is not having much difficulty getting in and out of cars or ascending/descending stairs, but she feels like she catches her foot when she walks. She has been ambulating with a cane in the community, but has been trying to walk without it as much as possible.    How long can you sit comfortably? 20-30 minutes   How long  can you stand comfortably? 20-30 minutes   How long can you walk comfortably? 10 minutes   Patient Stated Goals Improve knee ROM, improve walking   Currently in Pain? Yes   Pain Score 2    Pain Location Knee   Pain Orientation Right            [facility] PT Assessment - 05/09/15 0001    Assessment   Medical Diagnosis s/p L knee arthroscopy   Onset Date/Surgical Date 05/04/15   Next MD Visit 06/04/15   Prior Therapy no   Balance Screen   Has the patient fallen in the past 6 months No   Has the patient had a decrease in activity level because of a fear of falling?  No   Is the patient reluctant to leave their home because of a fear of falling?  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   ROM / Strength   AROM / PROM / Strength AROM;PROM;Strength  AROM   AROM Assessment Site Hip;Knee   Right/Left Hip Right;Left   Right Hip External Rotation  45   Right Hip Internal Rotation  44   Left Hip External Rotation  31   Left Hip Internal Rotation  25   Right/Left Knee Right;Left   Right Knee Extension 0   Right Knee Flexion 125   Left Knee Extension -9   Left Knee Flexion 89   PROM   PROM Assessment Site Knee   Right/Left Knee Left   Left Knee Extension -6   Left Knee Flexion 100   Strength   Strength Assessment Site Hip;Knee;Ankle   Right/Left Hip Right;Left   Right Hip Flexion 4+/5   Right Hip Extension 4-/5   Right Hip ABduction 5/5   Left Hip Flexion 4-/5   Left Hip Extension 3+/5   Left Hip ABduction 4-/5   Right/Left Knee Right;Left   Right Knee Flexion 4+/5   Right Knee Extension 4+/5   Left Knee Flexion 3-/5   Left  Knee Extension 3+/5   Right/Left Ankle Right;Left   Transfers   Five time sit to stand comments  15.41 seconds   Ambulation/Gait   Gait Comments TUG 12.35 seconds   Standardized Balance Assessment   Standardized Balance Assessment Berg Balance Test                PT Education - 05/09/15 1626    Education provided Yes   Education Details HEP prescribed, POC discussed with pt.    Person(s) Educated Patient   Methods Explanation;Handout   Comprehension Verbalized understanding;Returned demonstration          PT Short Term Goals - 05/09/15 1634    PT SHORT TERM GOAL #1   Title Pt will be independent with HEP.   Baseline     Time 2   Period Weeks   Status New   PT SHORT TERM GOAL #2   Title Knee flexion ROM will improve to 110 to improve ability to ambulate and descend stairs.    Time 2   Period Weeks   Status New   PT SHORT TERM GOAL #3   Title Improve LLE strength by 1/2 grade improve gait mechanics.   Time 2   Period Weeks   Status New           PT Long Term Goals - 05/09/15 1636    PT LONG TERM GOAL #1   Title Improve knee ROM to 0-115 degrees or greater to improve gait mechanics and allow pt to descend stairs.    Time 4   Period Weeks   Status New   PT LONG TERM GOAL #2   Title Improve L hip/knee strength to 4+/5 or greater to improve functional mobility and gait mechanics.    Time 4   Period Weeks   Status New   PT LONG TERM GOAL #3   Title Pt will demonstrate improved gait speed and decreased fall risk evidenced by TUG of <8 seconds.    Time 4   Period Weeks   Status New   PT LONG TERM GOAL #4   Title Pt will demonstrate improved functional mobility and LE strength evidenced by five time sit to stand time of 12 seconds.    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    Pt will benefit from skilled therapeutic intervention in order to improve on the following deficits Abnormal gait;Decreased activity tolerance;Decreased mobility;Decreased range of motion;Decreased strength;Difficulty walking;Pain   Rehab Potential Excellent   PT Frequency 3x / week   PT Duration 4 weeks   PT Treatment/Interventions Gait training;Stair training;Functional mobility training;Therapeutic activities;Therapeutic exercise;Balance training;Neuromuscular re-education;Manual techniques;Passive range of motion   PT Next Visit Plan F/u regarding HEP, continue with functional strengthening with TKE, mini squats   Consulted and Agree with Plan of Care Patient         Problem List Patient Active Problem List   Diagnosis Date Noted  . Medial meniscus tear   . Lateral meniscus tear, current   . Erosive lichen planus of vulva 08/28/2014  . 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 Right hip flexor tightness 12/06/2013  . Plantar fasciitis 12/06/2013    Leona Singleton, PT, DPT [phone] 05/09/2015, 4:41 PM  [facility] [address] [place], [place], [address] Phone: [phone]   Fax:  [phone]

## 2015-05-11 ENCOUNTER — Ambulatory Visit (HOSPITAL_COMMUNITY): Payer: BLUE CROSS/BLUE SHIELD | Admitting: Physical Therapy

## 2015-05-11 DIAGNOSIS — R29898 Other symptoms and signs involving the musculoskeletal system: Secondary | ICD-10-CM

## 2015-05-11 DIAGNOSIS — R262 Difficulty in walking, not elsewhere classified: Secondary | ICD-10-CM

## 2015-05-11 DIAGNOSIS — Z9889 Other specified postprocedural states: Secondary | ICD-10-CM | POA: Diagnosis not present

## 2015-05-11 NOTE — Therapy (Signed)
[facility] [address] [place], [place], [address] Phone: [phone]   Fax:  [phone]  Physical Therapy Treatment  Patient Details  Name: [name] MRN: [id number] Date of Birth: November 21, 1965 Referring Provider:  Vickki Hearing, MD  Encounter Date: 05/11/2015      PT End of Session - 05/11/15 0931    Visit Number 2   Number of Visits 12   Date for PT Re-Evaluation 06/06/15   Authorization Type BCBS   Authorization Time Period 06/06/15-07/04/15   PT Start Time 0800   PT Stop Time 0845   PT Time Calculation (min) 45 min   Activity Tolerance Patient tolerated treatment well   Behavior During Therapy [facility] for tasks assessed/performed      Past Medical History  Diagnosis Date  . COPD (chronic obstructive pulmonary disease)   . Hypertension   . Thyroid disease   . Sleep apnea     pt DX but did not want to use CPAP.  Marland Kitchen Hypothyroidism   . Arthritis     Past Surgical History  Procedure Laterality Date  . Nasal sinus surgery    . Knee arthroscopy with medial menisectomy Left 05/04/2015    Procedure: KNEE ARTHROSCOPY WITH MEDIAL AND LATERAL MENISECTOMY;  Surgeon: Vickki Hearing, MD;  Location: [facility];  Service: Orthopedics;  Laterality: Left;    There were no vitals filed for this visit.  Visit Diagnosis:  S/P left knee arthroscopy  Difficulty walking  Weakness of left leg      Subjective Assessment - 05/11/15 0811    Subjective Pt reports that her knee is a little sore today, rates pain as 5/10. She has been compliant with HEP.    Currently in Pain? Yes   Pain Score 5                          [facility] Adult PT Treatment/Exercise - 05/11/15 0001    Exercises   Exercises Knee/Hip   Knee/Hip Exercises: Stretches   Active Hamstring Stretch 3 reps;30 seconds;Left   Active Hamstring Stretch Limitations 12" step   Knee: Self-Stretch to increase Flexion 10 seconds  10 reps   Knee: Self-Stretch  Limitations 12" step   Gastroc Stretch 3 reps;30 seconds   Gastroc Stretch Limitations slantboard   Knee/Hip Exercises: Aerobic   Stationary Bike 5 minutes  unable to achieve full rotation   Knee/Hip Exercises: Standing   Heel Raises 15 reps   Forward Lunges 10 reps   Forward Lunges Limitations 7" step   Side Lunges 10 reps   Side Lunges Limitations 7" step   Terminal Knee Extension 15 reps   Theraband Level (Terminal Knee Extension) Level 3 (Green)   Forward Step Up 10 reps;Step Height: 4"   Rocker Board 2 minutes   Rocker Board Limitations R/L and A/P   Knee/Hip Exercises: Supine   Quad Sets 10 reps   Quad Sets Limitations 3 second hold   Bridges 15 reps   Straight Leg Raises 10 reps;2 sets   Knee/Hip Exercises: Sidelying   Hip ABduction Both;15 reps   Knee/Hip Exercises: Prone   Hamstring Curl 15 reps   Hip Extension 10 reps;Both                PT Education - 05/11/15 0844    Education provided Yes   Education Details Goals reviewed   Person(s) Educated Patient   Methods Explanation   Comprehension  Verbalized understanding          PT Short Term Goals - 05/09/15 1634    PT SHORT TERM GOAL #1   Title Pt will be independent with HEP.   Baseline     Time 2   Period Weeks   Status New   PT SHORT TERM GOAL #2   Title Knee flexion ROM will improve to 110 to improve ability to ambulate and descend stairs.    Time 2   Period Weeks   Status New   PT SHORT TERM GOAL #3   Title Improve LLE strength by 1/2 grade improve gait mechanics.   Time 2   Period Weeks   Status New           PT Long Term Goals - 05/09/15 1636    PT LONG TERM GOAL #1   Title Improve knee ROM to 0-115 degrees or greater to improve gait mechanics and allow pt to descend stairs.    Time 4   Period Weeks   Status New   PT LONG TERM GOAL #2   Title Improve L hip/knee strength to 4+/5 or greater to improve functional mobility and gait mechanics.    Time 4   Period Weeks    Status New   PT LONG TERM GOAL #3   Title Pt will demonstrate improved gait speed and decreased fall risk evidenced by TUG of <8 seconds.    Time 4   Period Weeks   Status New   PT LONG TERM GOAL #4   Title Pt will demonstrate improved functional mobility and LE strength evidenced by five time sit to stand time of 12 seconds.    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 She was able to complete all therex without c/o increased pain, but required verbal and tactile cuieng during side lunges for proper form. Pt denied any increased pain post treatment.    PT Next Visit Plan Continue with functional strengthening, add mini squats        Problem List Patient Active Problem List   Diagnosis Date Noted  . Medial meniscus tear   . Lateral meniscus tear, current   . Erosive lichen planus of vulva 08/28/2014  . 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 Right hip flexor tightness 12/06/2013  . Plantar fasciitis 12/06/2013    Leona Singleton, PT, DPT [phone] 05/11/2015, 9:57 AM  [facility] [address] [place], [place], [address] Phone: [phone]   Fax:  [phone]

## 2015-05-15 ENCOUNTER — Ambulatory Visit (HOSPITAL_COMMUNITY): Payer: BLUE CROSS/BLUE SHIELD | Admitting: Physical Therapy

## 2015-05-15 DIAGNOSIS — R262 Difficulty in walking, not elsewhere classified: Secondary | ICD-10-CM

## 2015-05-15 DIAGNOSIS — Z9889 Other specified postprocedural states: Secondary | ICD-10-CM | POA: Diagnosis not present

## 2015-05-15 DIAGNOSIS — M25662 Stiffness of left knee, not elsewhere classified: Secondary | ICD-10-CM

## 2015-05-15 DIAGNOSIS — R29898 Other symptoms and signs involving the musculoskeletal system: Secondary | ICD-10-CM

## 2015-05-16 ENCOUNTER — Ambulatory Visit (HOSPITAL_COMMUNITY): Payer: BLUE CROSS/BLUE SHIELD | Admitting: Physical Therapy

## 2015-05-16 DIAGNOSIS — Z9889 Other specified postprocedural states: Secondary | ICD-10-CM

## 2015-05-16 DIAGNOSIS — M25662 Stiffness of left knee, not elsewhere classified: Secondary | ICD-10-CM

## 2015-05-16 DIAGNOSIS — R262 Difficulty in walking, not elsewhere classified: Secondary | ICD-10-CM

## 2015-05-16 DIAGNOSIS — R29898 Other symptoms and signs involving the musculoskeletal system: Secondary | ICD-10-CM

## 2015-05-16 NOTE — Therapy (Signed)
[facility] [address] [place], [place], [address] Phone: [phone]   Fax:  [phone]  Physical Therapy Treatment  Patient Details  Name: [name] MRN: [id number] Date of Birth: 19-Aug-1966 Referring Provider:  Avis Epley, PA*  Encounter Date: 05/16/2015      PT End of Session - 05/16/15 1051    Visit Number 4   Number of Visits 12   Date for PT Re-Evaluation 06/06/15   Authorization Type BCBS   Authorization Time Period 06/06/15-07/04/15   PT Start Time 1016   PT Stop Time 1100   PT Time Calculation (min) 44 min   Activity Tolerance Patient tolerated treatment well   Behavior During Therapy [facility] for tasks assessed/performed      Past Medical History  Diagnosis Date  . COPD (chronic obstructive pulmonary disease)   . Hypertension   . Thyroid disease   . Sleep apnea     pt DX but did not want to use CPAP.  Marland Kitchen Hypothyroidism   . Arthritis     Past Surgical History  Procedure Laterality Date  . Nasal sinus surgery    . Knee arthroscopy with medial menisectomy Left 05/04/2015    Procedure: KNEE ARTHROSCOPY WITH MEDIAL AND LATERAL MENISECTOMY;  Surgeon: Vickki Hearing, MD;  Location: [facility];  Service: Orthopedics;  Laterality: Left;    There were no vitals filed for this visit.  Visit Diagnosis:  S/P left knee arthroscopy  Difficulty walking  Weakness of left leg  Stiffness of left knee      Subjective Assessment - 05/16/15 1017    Subjective Patient reports she is doing well today, only having 2/10 pain and felt OK after last session   Currently in Pain? Yes   Pain Score 2    Pain Location Knee   Pain Orientation Left                         [facility] Adult PT Treatment/Exercise - 05/16/15 0001    Knee/Hip Exercises: Stretches   Active Hamstring Stretch 3 reps;30 seconds;Both   Active Hamstring Stretch Limitations 12 inch step    Knee: Self-Stretch to increase Flexion Left;5  reps;10 seconds   Knee: Self-Stretch Limitations 12 inch step    Gastroc Stretch 3 reps;30 seconds   Gastroc Stretch Limitations slantboard   Knee/Hip Exercises: Aerobic   Nustep Level 2 for 8 minutes   Knee/Hip Exercises: Standing   Heel Raises Both;1 set;20 reps   Forward Lunges Both;1 set;10 reps   Forward Lunges Limitations 6 inch box    Side Lunges Both;1 set;10 reps   Side Lunges Limitations 6 inch step    Functional Squat 1 set;10 reps   Functional Squat Limitations mini-squats    Rocker Board Limitations x20 AP, x20 lateral U HHA    Other Standing Knee Exercises Hip ABD walks 2x62ft    Other Standing Knee Exercises 3D hip excursions 1x10; sit to stands with slow eccentric lower 1x10   Knee/Hip Exercises: Supine   Quad Sets Left;10 reps   Quad Sets Limitations 3 seconds    Bridges Both;1 set;15 reps   Straight Leg Raises Both;1 set;10 reps   Knee/Hip Exercises: Sidelying   Hip ABduction Both;1 set;15 reps   Knee/Hip Exercises: Prone   Hamstring Curl 15 reps   Hip Extension Both;10 reps                PT Education -  05/16/15 1051    Education provided Yes   Education Details advised to ice after PT sessions to avoid increases in edema    Person(s) Educated Patient   Methods Explanation   Comprehension Verbalized understanding          PT Short Term Goals - 05/09/15 1634    PT SHORT TERM GOAL #1   Title Pt will be independent with HEP.   Baseline     Time 2   Period Weeks   Status New   PT SHORT TERM GOAL #2   Title Knee flexion ROM will improve to 110 to improve ability to ambulate and descend stairs.    Time 2   Period Weeks   Status New   PT SHORT TERM GOAL #3   Title Improve LLE strength by 1/2 grade improve gait mechanics.   Time 2   Period Weeks   Status New           PT Long Term Goals - 05/09/15 1636    PT LONG TERM GOAL #1   Title Improve knee ROM to 0-115 degrees or greater to improve gait mechanics and allow pt to descend  stairs.    Time 4   Period Weeks   Status New   PT LONG TERM GOAL #2   Title Improve L hip/knee strength to 4+/5 or greater to improve functional mobility and gait mechanics.    Time 4   Period Weeks   Status New   PT LONG TERM GOAL #3   Title Pt will demonstrate improved gait speed and decreased fall risk evidenced by TUG of <8 seconds.    Time 4   Period Weeks   Status New   PT LONG TERM GOAL #4   Title Pt will demonstrate improved functional mobility and LE strength evidenced by five time sit to stand time of 12 seconds.    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 Introduced standing eccentric lowers as well as Nustep for lower exremity strengthening today. Patient tolerated session well overall today.    Pt will benefit from skilled therapeutic intervention in order to improve on the following deficits Abnormal gait;Decreased activity tolerance;Decreased mobility;Decreased range of motion;Decreased strength;Difficulty walking;Pain   Rehab Potential Excellent   PT Frequency 3x / week   PT Duration 4 weeks   PT Treatment/Interventions Gait training;Stair training;Functional mobility training;Therapeutic activities;Therapeutic exercise;Balance training;Neuromuscular re-education;Manual techniques;Passive range of motion   PT Next Visit Plan Continue with functional strength and stretches, incorporate stair training    Consulted and Agree with Plan of Care Patient        Problem List Patient Active Problem List   Diagnosis Date Noted  . Medial meniscus tear   . Lateral meniscus tear, current   . Erosive lichen planus of vulva  08/28/2014  . 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 Right hip flexor tightness 12/06/2013  . Plantar fasciitis 12/06/2013    Nedra Hai PT, DPT [phone]   [facility] [address] [place], [place], [address] Phone: [phone]   Fax:  [phone]

## 2015-05-18 ENCOUNTER — Ambulatory Visit (HOSPITAL_COMMUNITY): Payer: BLUE CROSS/BLUE SHIELD | Admitting: Physical Therapy

## 2015-05-18 ENCOUNTER — Encounter (HOSPITAL_COMMUNITY): Payer: BLUE CROSS/BLUE SHIELD | Admitting: Physical Therapy

## 2015-05-18 DIAGNOSIS — Z9889 Other specified postprocedural states: Secondary | ICD-10-CM | POA: Diagnosis not present

## 2015-05-18 DIAGNOSIS — R262 Difficulty in walking, not elsewhere classified: Secondary | ICD-10-CM

## 2015-05-18 DIAGNOSIS — R29898 Other symptoms and signs involving the musculoskeletal system: Secondary | ICD-10-CM

## 2015-05-18 DIAGNOSIS — M25662 Stiffness of left knee, not elsewhere classified: Secondary | ICD-10-CM

## 2015-05-18 NOTE — Therapy (Signed)
[facility] [address] [place], [place], [address] Phone: [phone]   Fax:  [phone]  Physical Therapy Treatment  Patient Details  Name: [name] MRN: [id number] Date of Birth: 1966/07/12 Referring Provider:  Vickki Hearing, MD  Encounter Date: 05/18/2015      PT End of Session - 05/18/15 0858    Visit Number 5   Number of Visits 12   Date for PT Re-Evaluation 06/06/15   Authorization Type BCBS   Authorization Time Period 06/06/15-07/04/15   PT Start Time 0800   PT Stop Time 0850   PT Time Calculation (min) 50 min   Activity Tolerance Patient tolerated treatment well   Behavior During Therapy [facility] for tasks assessed/performed      Past Medical History  Diagnosis Date  . COPD (chronic obstructive pulmonary disease)   . Hypertension   . Thyroid disease   . Sleep apnea     pt DX but did not want to use CPAP.  Marland Kitchen Hypothyroidism   . Arthritis     Past Surgical History  Procedure Laterality Date  . Nasal sinus surgery    . Knee arthroscopy with medial menisectomy Left 05/04/2015    Procedure: KNEE ARTHROSCOPY WITH MEDIAL AND LATERAL MENISECTOMY;  Surgeon: Vickki Hearing, MD;  Location: [facility];  Service: Orthopedics;  Laterality: Left;    There were no vitals filed for this visit.  Visit Diagnosis:  S/P left knee arthroscopy  Difficulty walking  Weakness of left leg  Stiffness of left knee      Subjective Assessment - 05/18/15 0802    Subjective Pt states she had increased pain in her Rt LE following therapy last sesion.  States her Lt knee did OK after icing it.  Currently without pain in either knee.   Currently in Pain? No/denies                         [facility] Adult PT Treatment/Exercise - 05/18/15 0803    Knee/Hip Exercises: Stretches   Active Hamstring Stretch 3 reps;30 seconds;Both   Active Hamstring Stretch Limitations 12 inch step    Knee: Self-Stretch to increase Flexion  Left;5 reps;10 seconds   Knee: Self-Stretch Limitations 12 inch step    Gastroc Stretch 3 reps;30 seconds   Gastroc Stretch Limitations slantboard   Knee/Hip Exercises: Aerobic   Nustep Level 3 hills #3 LE only for 8 minutes   Knee/Hip Exercises: Standing   Heel Raises Both;1 set;20 reps   Forward Lunges Both;15 reps   Forward Lunges Limitations 4 inch box    Side Lunges Both;15 reps   Side Lunges Limitations 4 inch step    Lateral Step Up 10 reps;Step Height: 6";Hand Hold: 1   Forward Step Up 10 reps;Step Height: 6";Hand Hold: 1   Functional Squat 1 set;10 reps   Functional Squat Limitations mini-squats    Stairs 2RT reciprocal 1 HR 7" step   SLS with Vectors Lt 5X5" with 1 HHA   Other Standing Knee Exercises Hip ABD walks 2x80ft green theraband                  PT Short Term Goals - 05/09/15 1634    PT SHORT TERM GOAL #1   Title Pt will be independent with HEP.   Baseline     Time 2   Period Weeks   Status New   PT SHORT TERM GOAL #2   Title  Knee flexion ROM will improve to 110 to improve ability to ambulate and descend stairs.    Time 2   Period Weeks   Status New   PT SHORT TERM GOAL #3   Title Improve LLE strength by 1/2 grade improve gait mechanics.   Time 2   Period Weeks   Status New           PT Long Term Goals - 05/09/15 1636    PT LONG TERM GOAL #1   Title Improve knee ROM to 0-115 degrees or greater to improve gait mechanics and allow pt to descend stairs.    Time 4   Period Weeks   Status New   PT LONG TERM GOAL #2   Title Improve L hip/knee strength to 4+/5 or greater to improve functional mobility and gait mechanics.    Time 4   Period Weeks   Status New   PT LONG TERM GOAL #3   Title Pt will demonstrate improved gait speed and decreased fall risk evidenced by TUG of <8 seconds.    Time 4   Period Weeks   Status New   PT LONG TERM GOAL #4   Title Pt will demonstrate improved functional mobility and LE strength evidenced by five  time sit to stand time of 12 seconds.    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   Added vector stance to work on LT LE stability and progressed to 4" level with lunges.  Theraband added to side step actvitiy to further increase hip abductor stretch and decrease antalgia due to weakness.  Worked on stair negotiation with patient able to complete with 7" height and 1 HHA.  Encouraged patient to ambulate more on own.      PT Next Visit Plan Continue with functional strength and stretches.  May begin treadmill to help decrease antalgia and normalized gait.         Problem List Patient Active Problem List   Diagnosis Date Noted  . Medial meniscus tear   . Lateral meniscus tear, current   . Erosive lichen planus of vulva 08/28/2014  . 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 Right hip flexor tightness 12/06/2013  . Plantar fasciitis 12/06/2013   Lurena Nida, PTA/CLT [id number]  05/18/2015, 9:04 AM  [facility] [address] [place], [place], [address] Phone: [phone]   Fax:  [phone]

## 2015-05-21 ENCOUNTER — Ambulatory Visit (HOSPITAL_COMMUNITY): Payer: BLUE CROSS/BLUE SHIELD | Admitting: Physical Therapy

## 2015-05-21 DIAGNOSIS — Z9889 Other specified postprocedural states: Secondary | ICD-10-CM | POA: Diagnosis not present

## 2015-05-21 DIAGNOSIS — R29898 Other symptoms and signs involving the musculoskeletal system: Secondary | ICD-10-CM

## 2015-05-21 DIAGNOSIS — M25662 Stiffness of left knee, not elsewhere classified: Secondary | ICD-10-CM

## 2015-05-21 DIAGNOSIS — R262 Difficulty in walking, not elsewhere classified: Secondary | ICD-10-CM

## 2015-05-21 NOTE — Therapy (Signed)
[facility] [address] [place], [place], [address] Phone: [phone]   Fax:  [phone]  Physical Therapy Treatment  Patient Details  Name: [name] MRN: [id number] Date of Birth: April 03, 1966 Referring Provider:  Vickki Hearing, MD  Encounter Date: 05/21/2015      PT End of Session - 05/21/15 0842    Visit Number 6   Number of Visits 12   Date for PT Re-Evaluation 06/06/15   Authorization Type BCBS   Authorization Time Period 06/06/15-07/04/15   PT Start Time 0800   PT Stop Time 0846   PT Time Calculation (min) 46 min   Activity Tolerance Patient tolerated treatment well   Behavior During Therapy [facility] for tasks assessed/performed      Past Medical History  Diagnosis Date  . COPD (chronic obstructive pulmonary disease)   . Hypertension   . Thyroid disease   . Sleep apnea     pt DX but did not want to use CPAP.  Marland Kitchen Hypothyroidism   . Arthritis     Past Surgical History  Procedure Laterality Date  . Nasal sinus surgery    . Knee arthroscopy with medial menisectomy Left 05/04/2015    Procedure: KNEE ARTHROSCOPY WITH MEDIAL AND LATERAL MENISECTOMY;  Surgeon: Vickki Hearing, MD;  Location: [facility];  Service: Orthopedics;  Laterality: Left;    There were no vitals filed for this visit.  Visit Diagnosis:  S/P left knee arthroscopy  Difficulty walking  Weakness of left leg  Stiffness of left knee      Subjective Assessment - 05/21/15 0804    Subjective Pt reports that she feels like she overdid it this weekend, and she has increased pain today.    Currently in Pain? Yes   Pain Score 6    Pain Location Knee   Pain Orientation Left                         [facility] Adult PT Treatment/Exercise - 05/21/15 0001    Knee/Hip Exercises: Stretches   Active Hamstring Stretch 3 reps;30 seconds;Both   Active Hamstring Stretch Limitations 12 inch step    Knee: Self-Stretch to increase Flexion 10 seconds   10 reps   Knee: Self-Stretch Limitations 12 inch step    Gastroc Stretch 3 reps;30 seconds   Gastroc Stretch Limitations slantboard   Knee/Hip Exercises: Aerobic   Tread Mill 5 minutes 1.5-1.7 mph focusing on form   Knee/Hip Exercises: Standing   Heel Raises Left;15 reps   Forward Lunges Both;15 reps   Forward Lunges Limitations 4 inch box    Side Lunges Both;15 reps   Side Lunges Limitations 4 inch step    Lateral Step Up 15 reps   Lateral Step Up Limitations 7" step   Forward Step Up 15 reps   Forward Step Up Limitations 7" step   Step Down 10 reps;Step Height: 4"   SLS with Vectors Lt 5X5" with 1 HHA   Other Standing Knee Exercises Hip ABD walks 2x70ft green theraband                PT Education - 05/21/15 0841    Education provided Yes   Education Details Educated on using treadmill at home to improve functional activity tolerance   Person(s) Educated Patient   Methods Explanation   Comprehension Verbalized understanding          PT Short Term Goals - 05/09/15 1634  PT SHORT TERM GOAL #1   Title Pt will be independent with HEP.   Baseline     Time 2   Period Weeks   Status New   PT SHORT TERM GOAL #2   Title Knee flexion ROM will improve to 110 to improve ability to ambulate and descend stairs.    Time 2   Period Weeks   Status New   PT SHORT TERM GOAL #3   Title Improve LLE strength by 1/2 grade improve gait mechanics.   Time 2   Period Weeks   Status New           PT Long Term Goals - 05/09/15 1636    PT LONG TERM GOAL #1   Title Improve knee ROM to 0-115 degrees or greater to improve gait mechanics and allow pt to descend stairs.    Time 4   Period Weeks   Status New   PT LONG TERM GOAL #2   Title Improve L hip/knee strength to 4+/5 or greater to improve functional mobility and gait mechanics.    Time 4   Period Weeks   Status New   PT LONG TERM GOAL #3   Title Pt will demonstrate improved gait speed and decreased fall risk  evidenced by TUG of <8 seconds.    Time 4   Period Weeks   Status New   PT LONG TERM GOAL #4   Title Pt will demonstrate improved functional mobility and LE strength evidenced by five time sit to stand time of 12 seconds.    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 Forward and lateral step up height was increased, and step downs were added today to improve quad control and eccentric strength. Pt denied any pain with progression of therex. Treadmill training was added at end of session to focus on normalizing gait mechanics. Pt required verbal cueing for upright posture and heel-toe gait, but pt showed improved form with ambulating on treadmill with UE support and after treadmill training than when she first walked into treatment session.    PT Next Visit Plan Continue with strengthening and gait training        Problem List Patient Active Problem List   Diagnosis Date Noted  . Medial meniscus tear   . Lateral meniscus tear, current   . Erosive lichen planus of vulva 08/28/2014  . 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 Right hip flexor tightness 12/06/2013  . Plantar fasciitis 12/06/2013    Leona Singleton, PT, DPT [phone] 05/21/2015, 9:49 AM  [facility] [address] [place], [place], [address] Phone: [phone]   Fax:  [phone]

## 2015-05-23 ENCOUNTER — Ambulatory Visit (HOSPITAL_COMMUNITY): Payer: BLUE CROSS/BLUE SHIELD | Admitting: Physical Therapy

## 2015-05-23 DIAGNOSIS — Z9889 Other specified postprocedural states: Secondary | ICD-10-CM

## 2015-05-23 DIAGNOSIS — R29898 Other symptoms and signs involving the musculoskeletal system: Secondary | ICD-10-CM

## 2015-05-23 DIAGNOSIS — R262 Difficulty in walking, not elsewhere classified: Secondary | ICD-10-CM

## 2015-05-23 NOTE — Therapy (Signed)
[facility] [address] [place], [place], [address] Phone: [phone]   Fax:  [phone]  Physical Therapy Treatment  Patient Details  Name: [name] MRN: [id number] Date of Birth: [birth year] Referring Provider:  Vickki Hearing, MD  Encounter Date: 05/23/2015      PT End of Session - 05/23/15 0842    Visit Number 7   Number of Visits 12   Date for PT Re-Evaluation 06/06/15   Authorization Type BCBS   Authorization Time Period 06/06/15-07/04/15   PT Start Time 0800   PT Stop Time 0843   PT Time Calculation (min) 43 min   Activity Tolerance Patient tolerated treatment well   Behavior During Therapy [facility] for tasks assessed/performed      Past Medical History  Diagnosis Date  . COPD (chronic obstructive pulmonary disease)   . Hypertension   . Thyroid disease   . Sleep apnea     pt DX but did not want to use CPAP.  Marland Kitchen Hypothyroidism   . Arthritis     Past Surgical History  Procedure Laterality Date  . Nasal sinus surgery    . Knee arthroscopy with medial menisectomy Left 05/04/2015    Procedure: KNEE ARTHROSCOPY WITH MEDIAL AND LATERAL MENISECTOMY;  Surgeon: Vickki Hearing, MD;  Location: [facility];  Service: Orthopedics;  Laterality: Left;    There were no vitals filed for this visit.  Visit Diagnosis:  S/P left knee arthroscopy  Difficulty walking  Weakness of left leg      Subjective Assessment - 05/23/15 0806    Subjective Pt reports that her leg feels ok today. She is worried that she may not be able to lift anything from a squat position without hurting her leg.    Currently in Pain? Yes   Pain Score 3    Pain Location Knee   Pain Orientation Left                         [facility] Adult PT Treatment/Exercise - 05/23/15 0001    Knee/Hip Exercises: Stretches   Active Hamstring Stretch 3 reps;30 seconds;Both   Active Hamstring Stretch Limitations 12 inch step    Knee: Self-Stretch to  increase Flexion 10 seconds  10 reps   Knee: Self-Stretch Limitations 14" step   Gastroc Stretch 3 reps;30 seconds   Gastroc Stretch Limitations slantboard   Knee/Hip Exercises: Standing   Heel Raises Left;15 reps   Knee Flexion Left;15 reps   Knee Flexion Limitations 4#   Lateral Step Up 15 reps   Lateral Step Up Limitations 7" step   Forward Step Up 15 reps   Forward Step Up Limitations 7" step   Step Down 10 reps;Step Height: 4"   Functional Squat 1 set;10 reps   Functional Squat Limitations x10 in front of chair, x10 picking up 5# weight from 12" box   Wall Squat 10 reps   Stairs 3Rt reciprocal   SLS 25" max on L   SLS with Vectors L 3x10"   Other Standing Knee Exercises Hip ABD walks 2x44ft green theraband                  PT Short Term Goals - 05/09/15 1634    PT SHORT TERM GOAL #1   Title Pt will be independent with HEP.   Baseline     Time 2   Period Weeks   Status New   PT SHORT  TERM GOAL #2   Title Knee flexion ROM will improve to 110 to improve ability to ambulate and descend stairs.    Time 2   Period Weeks   Status New   PT SHORT TERM GOAL #3   Title Improve LLE strength by 1/2 grade improve gait mechanics.   Time 2   Period Weeks   Status New           PT Long Term Goals - 05/09/15 1636    PT LONG TERM GOAL #1   Title Improve knee ROM to 0-115 degrees or greater to improve gait mechanics and allow pt to descend stairs.    Time 4   Period Weeks   Status New   PT LONG TERM GOAL #2   Title Improve L hip/knee strength to 4+/5 or greater to improve functional mobility and gait mechanics.    Time 4   Period Weeks   Status New   PT LONG TERM GOAL #3   Title Pt will demonstrate improved gait speed and decreased fall risk evidenced by TUG of <8 seconds.    Time 4   Period Weeks   Status New   PT LONG TERM GOAL #4   Title Pt will demonstrate improved functional mobility and LE strength evidenced by five time sit to stand time of 12  seconds.    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 Pt required max verbal and tactile cueing when squatting to pick up 5# weight from box in order to complete with proper form, this training needs to be continued next treatment. Pt denied any pain with any therex today.   PT Next Visit Plan Continue with training in squats and body mechanics        Problem List Patient Active Problem List   Diagnosis Date Noted  . Medial meniscus tear   . Lateral meniscus tear, current   . Erosive lichen planus of vulva 08/28/2014  . 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 Right hip flexor tightness 12/06/2013  . Plantar fasciitis 12/06/2013    Leona Singleton, PT, DPT [id number] 05/23/2015, 8:48 AM  [facility] [address] [place], [place], [address] Phone: [phone]   Fax:  [phone]

## 2015-05-25 ENCOUNTER — Ambulatory Visit (HOSPITAL_COMMUNITY): Payer: BLUE CROSS/BLUE SHIELD | Attending: Orthopedic Surgery | Admitting: Physical Therapy

## 2015-05-25 DIAGNOSIS — Z9889 Other specified postprocedural states: Secondary | ICD-10-CM

## 2015-05-25 DIAGNOSIS — R29898 Other symptoms and signs involving the musculoskeletal system: Secondary | ICD-10-CM

## 2015-05-25 DIAGNOSIS — R262 Difficulty in walking, not elsewhere classified: Secondary | ICD-10-CM | POA: Insufficient documentation

## 2015-05-25 DIAGNOSIS — M25662 Stiffness of left knee, not elsewhere classified: Secondary | ICD-10-CM | POA: Diagnosis present

## 2015-05-25 NOTE — Therapy (Signed)
[facility] [address] [place], [place], [address] Phone: [phone]   Fax:  [phone]  Physical Therapy Treatment  Patient Details  Name: [name] MRN: [id number] Date of Birth: 1966-09-01 Referring Provider:  Vickki Hearing, MD  Encounter Date: 05/25/2015      PT End of Session - 05/25/15 0923    Visit Number 8   Number of Visits 12   Date for PT Re-Evaluation 06/06/15   Authorization Type BCBS   Authorization Time Period 06/06/15-07/04/15   PT Start Time 0800   PT Stop Time 0846   PT Time Calculation (min) 46 min   Activity Tolerance Patient tolerated treatment well   Behavior During Therapy [facility] for tasks assessed/performed      Past Medical History  Diagnosis Date  . COPD (chronic obstructive pulmonary disease)   . Hypertension   . Thyroid disease   . Sleep apnea     pt DX but did not want to use CPAP.  Marland Kitchen Hypothyroidism   . Arthritis     Past Surgical History  Procedure Laterality Date  . Nasal sinus surgery    . Knee arthroscopy with medial menisectomy Left 05/04/2015    Procedure: KNEE ARTHROSCOPY WITH MEDIAL AND LATERAL MENISECTOMY;  Surgeon: Vickki Hearing, MD;  Location: [facility];  Service: Orthopedics;  Laterality: Left;    There were no vitals filed for this visit.  Visit Diagnosis:  S/P left knee arthroscopy  Difficulty walking  Weakness of left leg  Stiffness of left knee      Subjective Assessment - 05/25/15 0807    Subjective Pt reports that her leg feels fine today, denies having much pain.    Currently in Pain? Yes   Pain Score 1                [facility] Adult PT Treatment/Exercise - 05/25/15 0001    Therapeutic Activites    Therapeutic Activities Lifting   Lifting Squatting and lifting 5# weight from 12" step    Knee/Hip Exercises: Stretches   Active Hamstring Stretch 3 reps;30 seconds;Both   Active Hamstring Stretch Limitations 12 inch step    Knee: Self-Stretch to  increase Flexion 10 seconds  10 reps   Knee: Self-Stretch Limitations 14" step   Gastroc Stretch 3 reps;30 seconds   Gastroc Stretch Limitations slantboard   Knee/Hip Exercises: Standing   Heel Raises Left;15 reps   Forward Lunges Both;15 reps   Forward Lunges Limitations 2 inch box   Side Lunges Both;15 reps   Side Lunges Limitations 2 inch box   Lateral Step Up 15 reps   Lateral Step Up Limitations 7" step   Wall Squat 10 reps;3 seconds   Knee/Hip Exercises: Seated   Stool Scoot - Round Trips 1  30' x 2                  PT Short Term Goals - 05/09/15 1634    PT SHORT TERM GOAL #1   Title Pt will be independent with HEP.   Baseline     Time 2   Period Weeks   Status New   PT SHORT TERM GOAL #2   Title Knee flexion ROM will improve to 110 to improve ability to ambulate and descend stairs.    Time 2   Period Weeks   Status New   PT SHORT TERM GOAL #3   Title Improve LLE strength by 1/2 grade improve gait mechanics.  Time 2   Period Weeks   Status New           PT Long Term Goals - 05/09/15 1636    PT LONG TERM GOAL #1   Title Improve knee ROM to 0-115 degrees or greater to improve gait mechanics and allow pt to descend stairs.    Time 4   Period Weeks   Status New   PT LONG TERM GOAL #2   Title Improve L hip/knee strength to 4+/5 or greater to improve functional mobility and gait mechanics.    Time 4   Period Weeks   Status New   PT LONG TERM GOAL #3   Title Pt will demonstrate improved gait speed and decreased fall risk evidenced by TUG of <8 seconds.    Time 4   Period Weeks   Status New   PT LONG TERM GOAL #4   Title Pt will demonstrate improved functional mobility and LE strength evidenced by five time sit to stand time of 12 seconds.    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 Pt continues to require verbal and tactile cues during squatting to maintain proper form and to avoid lumbar flexion. Stool scoots were added today for hamstring strengthening, pt required rest break after 30'. Pt denied any increased pain following treatment.    PT Next Visit Plan Continue with work-related training such as squatting and lifting to ensure proper mechanics to prevent knee injury        Problem List Patient Active Problem List   Diagnosis Date Noted  . Medial meniscus tear   . Lateral meniscus tear, current   . Erosive lichen planus of vulva 08/28/2014  . 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 Right hip flexor tightness 12/06/2013  . Plantar fasciitis 12/06/2013    Leona Singleton, PT, DPT [phone] 05/25/2015, 9:26 AM  [facility] [address] [place], [place], [address] Phone: [phone]   Fax:  [phone]

## 2015-05-29 ENCOUNTER — Ambulatory Visit (HOSPITAL_COMMUNITY): Payer: BLUE CROSS/BLUE SHIELD | Admitting: Physical Therapy

## 2015-05-29 ENCOUNTER — Encounter: Payer: Self-pay | Admitting: Orthopedic Surgery

## 2015-05-29 ENCOUNTER — Ambulatory Visit (INDEPENDENT_AMBULATORY_CARE_PROVIDER_SITE_OTHER): Payer: BLUE CROSS/BLUE SHIELD | Admitting: Orthopedic Surgery

## 2015-05-29 VITALS — BP 133/82 | Ht 63.0 in | Wt 220.0 lb

## 2015-05-29 DIAGNOSIS — Z9889 Other specified postprocedural states: Secondary | ICD-10-CM

## 2015-05-29 DIAGNOSIS — R29898 Other symptoms and signs involving the musculoskeletal system: Secondary | ICD-10-CM

## 2015-05-29 DIAGNOSIS — R262 Difficulty in walking, not elsewhere classified: Secondary | ICD-10-CM

## 2015-05-29 DIAGNOSIS — Z4789 Encounter for other orthopedic aftercare: Secondary | ICD-10-CM

## 2015-05-29 DIAGNOSIS — M25662 Stiffness of left knee, not elsewhere classified: Secondary | ICD-10-CM

## 2015-05-29 NOTE — Patient Instructions (Signed)
Return to work 06/04/15

## 2015-05-29 NOTE — Therapy (Signed)
[facility] [address] [place], [place], [address] Phone: [phone]   Fax:  [phone]  Physical Therapy Treatment  Patient Details  Name: [name] MRN: [id number] Date of Birth: Jan 07, 1966 Referring Provider:  Vickki Hearing, MD  Encounter Date: 05/15/2015      PT End of Session - 05/29/15 1209    Visit Number 9   Number of Visits 12   Date for PT Re-Evaluation 06/06/15   Authorization Type BCBS   Authorization Time Period 06/06/15-07/04/15   PT Start Time 0800   PT Stop Time 0844   PT Time Calculation (min) 44 min   Activity Tolerance Patient tolerated treatment well   Behavior During Therapy [facility] for tasks assessed/performed      Past Medical History  Diagnosis Date  . COPD (chronic obstructive pulmonary disease)   . Hypertension   . Thyroid disease   . Sleep apnea     pt DX but did not want to use CPAP.  Marland Kitchen Hypothyroidism   . Arthritis     Past Surgical History  Procedure Laterality Date  . Nasal sinus surgery    . Knee arthroscopy with medial menisectomy Left 05/04/2015    Procedure: KNEE ARTHROSCOPY WITH MEDIAL AND LATERAL MENISECTOMY;  Surgeon: Vickki Hearing, MD;  Location: [facility];  Service: Orthopedics;  Laterality: Left;    There were no vitals filed for this visit.  Visit Diagnosis:  S/P left knee arthroscopy  Difficulty walking  Weakness of left leg  Stiffness of left knee      Subjective Assessment - 05/29/15 0805    Subjective Pt reports that her leg was hurting last night and this morning, she had to put ice on it this morning. She rates pain as a 4/10 today.    Currently in Pain? Yes   Pain Score 4    Pain Location Knee   Pain Orientation Left            [facility] PT Assessment - 05/29/15 0001    PROM   Left Knee Extension 0   Left Knee Flexion 113   Strength   Right Hip Flexion 4+/5   Right Hip Extension 4-/5   Right Hip ABduction 5/5   Left Hip Flexion 4/5   Left Hip  Extension 4-/5   Left Hip ABduction 4+/5   Right Knee Flexion 4+/5   Right Knee Extension 5/5   Left Knee Flexion 4+/5   Left Knee Extension 5/5                     [facility] Adult PT Treatment/Exercise - 05/29/15 0001    Transfers   Five time sit to stand comments  12.83 seconds   Ambulation/Gait   Gait Comments TUG 8.18 seconds   Therapeutic Activites    Therapeutic Activities Lifting   Lifting Squatting and lifting 25# weight from 14" step    Knee/Hip Exercises: Stretches   Active Hamstring Stretch 3 reps;30 seconds;Both   Active Hamstring Stretch Limitations 12 inch step    Knee: Self-Stretch to increase Flexion 10 seconds  10 reps   Knee: Self-Stretch Limitations 14" step   Gastroc Stretch 3 reps;30 seconds   Gastroc Stretch Limitations slantboard   Knee/Hip Exercises: Standing   Forward Lunges 15 reps   Forward Lunges Limitations onto airex   Functional Squat 15 reps   Wall Squat 10 reps;3 seconds  PT Short Term Goals - 05/09/15 1634    PT SHORT TERM GOAL #1   Title Pt will be independent with HEP.   Baseline     Time 2   Period Weeks   Status New   PT SHORT TERM GOAL #2   Title Knee flexion ROM will improve to 110 to improve ability to ambulate and descend stairs.    Time 2   Period Weeks   Status New   PT SHORT TERM GOAL #3   Title Improve LLE strength by 1/2 grade improve gait mechanics.   Time 2   Period Weeks   Status New           PT Long Term Goals - 05/09/15 1636    PT LONG TERM GOAL #1   Title Improve knee ROM to 0-115 degrees or greater to improve gait mechanics and allow pt to descend stairs.    Time 4   Period Weeks   Status New   PT LONG TERM GOAL #2   Title Improve L hip/knee strength to 4+/5 or greater to improve functional mobility and gait mechanics.    Time 4   Period Weeks   Status New   PT LONG TERM GOAL #3   Title Pt will demonstrate improved gait speed and decreased fall risk evidenced by  TUG of <8 seconds.    Time 4   Period Weeks   Status New   PT LONG TERM GOAL #4   Title Pt will demonstrate improved functional mobility and LE strength evidenced by five time sit to stand time of 12 seconds.    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 Pt has made steady gains in her LLE strength, however, she still demonstrates weakness in hip abduction and hip extension. She is still lacking L knee flexion ROM, achieving 113 passively. She demonstrates improved functional mobility, evidenced by TUG and five time sit to stand times, and she has been demonstrating good form and body mechanics with squatting and lifting activities. Pt will benefit from continued treatment to address her knee ROM and hip strength to allow her to return to work without risk of injury.    PT Next Visit Plan Continue with lifting, squatting, follow up regarding MD visit.         Problem List Patient Active Problem List   Diagnosis Date Noted  . Medial meniscus tear   . Lateral meniscus tear, current   . Erosive lichen planus of vulva 08/28/2014  . 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 Right hip flexor tightness 12/06/2013  . Plantar fasciitis 12/06/2013    Leona Singleton, PT, DPT [phone] 05/29/2015, 3:56 PM  [place] [facility] [facility] [address] [place], [place], [address] Phone: [phone]   Fax:  [phone]

## 2015-05-29 NOTE — Progress Notes (Signed)
Patient ID: [name], female   DOB: July 02, 1966, 49 y.o.   MRN: [id number]  Follow up visit  Chief Complaint  Patient presents with  . Follow-up    post op 2, SALK, DOS 05/04/15    BP 133/82 mmHg  Ht  (1.6 m)  Wt 220 lb (99.791 kg)  BMI 38.98 kg/m2  LMP 04/14/2011  Encounter Diagnoses  Name Primary?  . S/P left knee arthroscopy   . Aftercare following surgery of the musculoskeletal system Yes   The patient presents back with improvement in her overall knee function. She is ready to go back to work.  The knee shows no joint effusion flexion 115 measured motor exam is intact normal straight leg raise.  Patient return to work on September 12  She will follow-up with [place] as needed   PS Surgical report  PRE-OPERATIVE DIAGNOSIS:  LEFT medial MENISCAL TEAR  POST-OPERATIVE DIAGNOSIS:  LEFT MEDIAL AND LATERAL MENISCAL TEAR  PROCEDURE:  Procedure(s): KNEE ARTHROSCOPY WITH MEDIAL AND LATERAL MENISECTOMY (Left) 29880  Indications the patient had left knee pain which was unresponsive to nonsurgical management and she opted for arthroscopic surgery to try to improve the function in her knee and decrease the pain.  Findings at surgery: She had extensive arthritis and synovitis of the knee joint. There is arthritic change in both the medial and lateral and patellofemoral compartments.  Chondromalacia of the patella lateral facet grade 2 with fibrillation and crabmeat appearance Chondromalacia of the medial and lateral femoral condyle Degenerative tear of the free edges of the lateral meniscus Posterior horn radial root tear of the medial meniscus

## 2015-05-29 NOTE — Therapy (Signed)
[facility] [address] [place], [place], [address] Phone: [phone]   Fax:  [phone]  Physical Therapy Treatment  Patient Details  Name: [name] MRN: [id number] Date of Birth: 26-Jun-1966 Referring Provider:  Vickki Hearing, MD  Encounter Date: 05/29/2015      PT End of Session - 05/29/15 1209    Visit Number 9   Number of Visits 12   Date for PT Re-Evaluation 06/06/15   Authorization Type BCBS   Authorization Time Period 06/06/15-07/04/15   PT Start Time 0800   PT Stop Time 0844   PT Time Calculation (min) 44 min   Activity Tolerance Patient tolerated treatment well   Behavior During Therapy [facility] for tasks assessed/performed      Past Medical History  Diagnosis Date  . COPD (chronic obstructive pulmonary disease)   . Hypertension   . Thyroid disease   . Sleep apnea     pt DX but did not want to use CPAP.  Marland Kitchen Hypothyroidism   . Arthritis     Past Surgical History  Procedure Laterality Date  . Nasal sinus surgery    . Knee arthroscopy with medial menisectomy Left 05/04/2015    Procedure: KNEE ARTHROSCOPY WITH MEDIAL AND LATERAL MENISECTOMY;  Surgeon: Vickki Hearing, MD;  Location: [facility];  Service: Orthopedics;  Laterality: Left;    There were no vitals filed for this visit.  Visit Diagnosis:  S/P left knee arthroscopy  Stiffness of left knee  Difficulty walking  Weakness of left leg      Subjective Assessment - 05/29/15 0805    Subjective Pt reports that her leg was hurting last night and this morning, she had to put ice on it this morning. She rates pain as a 4/10 today.    Currently in Pain? Yes   Pain Score 4    Pain Location Knee   Pain Orientation Left            [facility] PT Assessment - 05/29/15 0001    PROM   Left Knee Extension 0   Left Knee Flexion 113   Strength   Right Hip Flexion 4+/5   Right Hip Extension 4-/5   Right Hip ABduction 5/5   Left Hip Flexion 4/5   Left Hip  Extension 4-/5   Left Hip ABduction 4+/5   Right Knee Flexion 4+/5   Right Knee Extension 5/5   Left Knee Flexion 4+/5   Left Knee Extension 5/5                     [facility] Adult PT Treatment/Exercise - 05/29/15 0001    Transfers   Five time sit to stand comments  12.83 seconds   Ambulation/Gait   Gait Comments TUG 8.18 seconds   Therapeutic Activites    Therapeutic Activities Lifting   Lifting Squatting and lifting 25# weight from 14" step    Knee/Hip Exercises: Stretches   Active Hamstring Stretch 3 reps;30 seconds;Both   Active Hamstring Stretch Limitations 12 inch step    Knee: Self-Stretch to increase Flexion 10 seconds  10 reps   Knee: Self-Stretch Limitations 14" step   Gastroc Stretch 3 reps;30 seconds   Gastroc Stretch Limitations slantboard   Knee/Hip Exercises: Standing   Forward Lunges 15 reps   Forward Lunges Limitations onto airex   Functional Squat 15 reps   Wall Squat 10 reps;3 seconds  PT Short Term Goals - 05/09/15 1634    PT SHORT TERM GOAL #1   Title Pt will be independent with HEP.   Baseline     Time 2   Period Weeks   Status New   PT SHORT TERM GOAL #2   Title Knee flexion ROM will improve to 110 to improve ability to ambulate and descend stairs.    Time 2   Period Weeks   Status New   PT SHORT TERM GOAL #3   Title Improve LLE strength by 1/2 grade improve gait mechanics.   Time 2   Period Weeks   Status New           PT Long Term Goals - 05/09/15 1636    PT LONG TERM GOAL #1   Title Improve knee ROM to 0-115 degrees or greater to improve gait mechanics and allow pt to descend stairs.    Time 4   Period Weeks   Status New   PT LONG TERM GOAL #2   Title Improve L hip/knee strength to 4+/5 or greater to improve functional mobility and gait mechanics.    Time 4   Period Weeks   Status New   PT LONG TERM GOAL #3   Title Pt will demonstrate improved gait speed and decreased fall risk evidenced by  TUG of <8 seconds.    Time 4   Period Weeks   Status New   PT LONG TERM GOAL #4   Title Pt will demonstrate improved functional mobility and LE strength evidenced by five time sit to stand time of 12 seconds.    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 Pt has made steady gains in her LLE strength, however, she still demonstrates weakness in hip abduction and hip extension. She is still lacking L knee flexion ROM, achieving 113 passively. She demonstrates improved functional mobility, evidenced by TUG and five time sit to stand times, and she has been demonstrating good form and body mechanics with squatting and lifting activities. Pt will benefit from continued treatment to address her knee ROM and hip strength to allow her to return to work without risk of injury.    PT Next Visit Plan Continue with lifting, squatting, follow up regarding MD visit.         Problem List Patient Active Problem List   Diagnosis Date Noted  . Medial meniscus tear   . Lateral meniscus tear, current   . Erosive lichen planus of vulva 08/28/2014  . 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 Right hip flexor tightness 12/06/2013  . Plantar fasciitis 12/06/2013    Leona Singleton, PT, DPT [phone] 05/29/2015, 12:16 PM  [place] [facility] [address] [place], [place], [address] Phone: [phone]   Fax:  [phone]

## 2015-05-30 ENCOUNTER — Ambulatory Visit (HOSPITAL_COMMUNITY): Payer: BLUE CROSS/BLUE SHIELD | Admitting: Physical Therapy

## 2015-05-30 DIAGNOSIS — R29898 Other symptoms and signs involving the musculoskeletal system: Secondary | ICD-10-CM

## 2015-05-30 DIAGNOSIS — R262 Difficulty in walking, not elsewhere classified: Secondary | ICD-10-CM

## 2015-05-30 DIAGNOSIS — Z9889 Other specified postprocedural states: Secondary | ICD-10-CM | POA: Diagnosis not present

## 2015-05-30 DIAGNOSIS — M25662 Stiffness of left knee, not elsewhere classified: Secondary | ICD-10-CM

## 2015-05-30 NOTE — Therapy (Signed)
[place], [place], [address] Phone: [phone]   Fax:  [phone]  Physical Therapy Treatment  Patient Details  Name: [name] MRN: [id number] Date of Birth: Apr 30, 1966 Referring Provider:  Carole Civil, MD  Encounter Date: 05/30/2015      PT End of Session - 05/30/15 0836    Visit Number 10   Number of Visits 12   Authorization Type BCBS   Authorization Time Period 06/06/15-07/04/15   PT Start Time 0800   PT Stop Time 0845   PT Time Calculation (min) 45 min   Activity Tolerance Patient tolerated treatment well   Behavior During Therapy [facility] for tasks assessed/performed      Past Medical History  Diagnosis Date  . COPD (chronic obstructive pulmonary disease)   . Hypertension   . Thyroid disease   . Sleep apnea     pt DX but did not want to use CPAP.  Marland Kitchen Hypothyroidism   . Arthritis     Past Surgical History  Procedure Laterality Date  . Nasal sinus surgery    . Knee arthroscopy with medial menisectomy Left 05/04/2015    Procedure: KNEE ARTHROSCOPY WITH MEDIAL AND LATERAL MENISECTOMY;  Surgeon: Carole Civil, MD;  Location: [facility];  Service: Orthopedics;  Laterality: Left;    There were no vitals filed for this visit.  Visit Diagnosis:  S/P left knee arthroscopy  Stiffness of left knee  Difficulty walking  Weakness of left leg      Subjective Assessment - 05/30/15 0805    Subjective Pt reports that she saw her surgeon yesterday, and he cleared her to return to work. She feels that her strength, walking, and balance have all improved, but she still gets some pain in her knee.    How long can you sit comfortably? no limitations   How long can you stand comfortably? one hour or more   How long can you walk comfortably? one hour or more   Currently in Pain? Yes   Pain Score 3    Pain Location Knee   Pain Orientation Left            [facility] PT Assessment - 05/30/15 0001     PROM   Left Knee Extension 0   Left Knee Flexion 115   Strength   Right Hip Flexion 4+/5   Right Hip Extension 4-/5   Right Hip ABduction 5/5   Left Hip Flexion 4/5   Left Hip Extension 4-/5   Left Hip ABduction 4+/5   Right Knee Flexion 4+/5   Right Knee Extension 5/5   Left Knee Flexion 4+/5   Left Knee Extension 5/5                     [facility] Adult PT Treatment/Exercise - 05/30/15 0001    Knee/Hip Exercises: Stretches   Active Hamstring Stretch 3 reps;30 seconds;Both   Active Hamstring Stretch Limitations 12 inch step    Knee: Self-Stretch to increase Flexion 10 seconds  10 reps   Knee: Self-Stretch Limitations 14" step   Gastroc Stretch 3 reps;30 seconds   Gastroc Stretch Limitations slantboard   Knee/Hip Exercises: Aerobic   Nustep 10' hills 3 level 3   Knee/Hip Exercises: Standing   Heel Raises Left;15 reps   Functional Squat 15 reps   Wall Squat 10 reps;3 seconds   Other Standing Knee Exercises Hip ABD walks 2x17f green theraband  PT Education - 05/30/15 [phone]    Education provided Yes   Education Details HEP reviewed for proper form with exercises   Person(s) Educated Patient   Methods Explanation   Comprehension Verbalized understanding          PT Short Term Goals - 05/30/15 9233    PT SHORT TERM GOAL #1   Title Pt will be independent with HEP.   Time 2   Period Weeks   Status Achieved   PT SHORT TERM GOAL #2   Title Knee flexion ROM will improve to 110 to improve ability to ambulate and descend stairs.    Time 2   Period Weeks   Status Achieved   PT SHORT TERM GOAL #3   Title Improve LLE strength by 1/2 grade improve gait mechanics.   Time 2   Period Weeks   Status Achieved           PT Long Term Goals - 05/30/15 0076    PT LONG TERM GOAL #1   Title Improve knee ROM to 0-115 degrees or greater to improve gait mechanics and allow pt to descend stairs.    Time 4   Period Weeks   Status Achieved   PT  LONG TERM GOAL #2   Title Improve L hip/knee strength to 4+/5 or greater to improve functional mobility and gait mechanics.    Time 4   Period Weeks   Status Partially Met   PT LONG TERM GOAL #3   Title Pt will demonstrate improved gait speed and decreased fall risk evidenced by TUG of <8 seconds.    Time 4   Period Weeks   Status Achieved   PT LONG TERM GOAL #4   Title Pt will demonstrate improved functional mobility and LE strength evidenced by five time sit to stand time of 12 seconds.    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 She has shown improvements in strength, ROM, functional mobility, functional activity tolerance, and gait mechanics. She has met her LTGs and is planning on returning to work on Monday following clearance from her surgeon. Pt demonstrates excellent compliance with HEP, and is being discharged to continue independently with HEP.         Problem List Patient Active Problem List   Diagnosis Date Noted  . Medial meniscus tear   . Lateral meniscus tear, current   . Erosive lichen planus of vulva 08/28/2014  . 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 Right hip flexor tightness 12/06/2013  . Plantar fasciitis 12/06/2013     PHYSICAL THERAPY DISCHARGE SUMMARY  Visits from Start of Care: 10  Current functional level related to goals / functional outcomes: Pt demonstrates increased ROM to 0-115 degrees, improved BLE strength, and improved functional activity tolerance. Pt has met 4/5 goals and has partially met goal for strength.    Remaining deficits: Decreased hip extension and abduction strength.    Education / Equipment: HEP   Plan: Patient agrees to discharge.  Patient goals were met. Patient is being  discharged due to meeting the stated rehab goals.  ?????      Hilma Favors, PT, DPT [phone] 05/30/2015, 8:47 AM  [place], [place], [address] Phone: [phone]   Fax:  [phone]

## 2015-06-01 ENCOUNTER — Encounter (HOSPITAL_COMMUNITY): Payer: BLUE CROSS/BLUE SHIELD

## 2015-06-25 ENCOUNTER — Other Ambulatory Visit: Payer: Self-pay

## 2015-06-25 DIAGNOSIS — Z1231 Encounter for screening mammogram for malignant neoplasm of breast: Secondary | ICD-10-CM

## 2015-07-05 ENCOUNTER — Ambulatory Visit
Admission: RE | Admit: 2015-07-05 | Discharge: 2015-07-05 | Disposition: A | Discharge status: Home / Self Care | Payer: BLUE CROSS/BLUE SHIELD | Source: Ambulatory Visit

## 2015-07-05 DIAGNOSIS — Z1231 Encounter for screening mammogram for malignant neoplasm of breast: Secondary | ICD-10-CM

## 2015-08-21 ENCOUNTER — Ambulatory Visit (HOSPITAL_COMMUNITY): Payer: BLUE CROSS/BLUE SHIELD | Attending: Podiatry | Admitting: Physical Therapy

## 2015-08-21 DIAGNOSIS — M722 Plantar fascial fibromatosis: Secondary | ICD-10-CM

## 2015-08-21 DIAGNOSIS — M25672 Stiffness of left ankle, not elsewhere classified: Secondary | ICD-10-CM | POA: Diagnosis present

## 2015-08-21 DIAGNOSIS — R262 Difficulty in walking, not elsewhere classified: Secondary | ICD-10-CM

## 2015-08-21 DIAGNOSIS — M6281 Muscle weakness (generalized): Secondary | ICD-10-CM

## 2015-08-21 DIAGNOSIS — M79672 Pain in left foot: Secondary | ICD-10-CM | POA: Diagnosis present

## 2015-08-21 NOTE — Therapy (Signed)
[facility] [address] [place], [place], [address] Phone: [phone]   Fax:  [phone]  Physical Therapy Evaluation  Patient Details  Name: [name] MRN: [id number] Date of Birth: 07/24/1966 Referring Provider: Dr. Nolen MuMcKinney   Encounter Date: 08/21/2015      PT End of Session - 08/21/15 1704    Visit Number 1   Number of Visits 12   Date for PT Re-Evaluation 09/18/15   Authorization Type BCBS   Authorization Time Period 08/21/15 to 10/21/15   PT Start Time 1609   PT Stop Time 1640   PT Time Calculation (min) 31 min   Activity Tolerance Patient tolerated treatment well   Behavior During Therapy [facility] for tasks assessed/performed      Past Medical History  Diagnosis Date  . COPD (chronic obstructive pulmonary disease)   . Hypertension   . Thyroid disease   . Sleep apnea     pt DX but did not want to use CPAP.  Marland Kitchen. Hypothyroidism   . Arthritis     Past Surgical History  Procedure Laterality Date  . Nasal sinus surgery    . Knee arthroscopy with medial menisectomy Left 05/04/2015    Procedure: KNEE ARTHROSCOPY WITH MEDIAL AND LATERAL MENISECTOMY;  Surgeon: Vickki HearingStanley E Harrison, MD;  Location: [facility];  Service: Orthopedics;  Laterality: Left;    There were no vitals filed for this visit.  Visit Diagnosis:  Plantar fasciitis of left foot - Plan: PT plan of care cert/re-cert  Left foot pain - Plan: PT plan of care cert/re-cert  Difficulty walking - Plan: PT plan of care cert/re-cert  Ankle stiffness, left - Plan: PT plan of care cert/re-cert  Muscle weakness - Plan: PT plan of care cert/re-cert      Subjective Assessment - 08/21/15 1612    Subjective Very painful, can't walk barefoot at all. Is not sleeping in a boot now, but does have some arch supports in her shoes. Is very painful at work. Pain is the worst when she first gets up.    Pertinent History Patient reports that plantar fasciitis has been going on for a  long long time, ended up going to MD because it was hurting so bad she couldn't stand it. Was given shots, which did help with pain, and was sent to PT by MD.    How long can you stand comfortably? 11/29- no limits    Patient Stated Goals Improve knee ROM, improve walking   Currently in Pain? No/denies  but gets to 10/10 pain at worst             [facility] Assessment - 08/21/15 0001    Assessment   Medical Diagnosis plantar fasciitis L    Referring Provider Dr. Nolen MuMcKinney    Onset Date/Surgical Date --  chronic    Next MD Visit December 27th with Dr. Nolen MuMcKinney    Balance Screen   Has the patient fallen in the past 6 months No   Has the patient had a decrease in activity level because of a fear of falling?  Yes   Is the patient reluctant to leave their home because of a fear of falling?  Yes   Home Environment   Type of Home Mobile home   Home Access Stairs to enter   Entrance Stairs-Number of Steps 4   Entrance Stairs-Rails Right;Left   Prior Function   Level of Independence Independent   Vocation Full time employment  Vocation Requirements CNA- 8 hours, lots of walking and lifting    Observation/Other Assessments   Focus on Therapeutic Outcomes (FOTO)  74% limited    AROM   Right Ankle Dorsiflexion 4   Right Ankle Plantar Flexion --  wfl    Right Ankle Inversion 17   Right Ankle Eversion 30   Left Ankle Dorsiflexion 8   Left Ankle Plantar Flexion --  wfl    Left Ankle Inversion 20  pain    Left Ankle Eversion 5  pain limited    Strength   Right Ankle Dorsiflexion 5/5   Right Ankle Plantar Flexion 3+/5   Right Ankle Inversion 4+/5   Right Ankle Eversion 4+/5   Left Ankle Dorsiflexion 5/5   Left Ankle Plantar Flexion 3-/5   Left Ankle Inversion 4/5   Left Ankle Eversion 4+/5   Palpation   Palpation comment general foot, calcaneal, and talar mobility appears slightly stiffer as compared to R    Ambulation/Gait   Gait Comments flexed at hips, reduced gait speed,  supination, proximal muscle weakness                           PT Education - 08/21/15 1704    Education provided Yes   Education Details prognosis, plan of care, HEP    Person(s) Educated Patient   Methods Explanation;Demonstration;Handout   Comprehension Verbalized understanding;Need further instruction          PT Short Term Goals - 08/21/15 1743    PT SHORT TERM GOAL #1   Title [name] in order to enhance job performance and overall mobilty    Time 3   Period Weeks   Status New   PT SHORT TERM GOAL #2   Title Patient to demonstrate L and R ankle dorsiflexion range of motion of at least 15 degrees bilaterally in order to improve gait, reduce joint stiffness, and improve mobiltiy    Time 3   Period Weeks   Status New   PT SHORT TERM GOAL #3   Title L ankle inversion and eversion range of motion to match that of R ankle in order to improve mechanics and reduce mechanical stiffness    Time 3   Period Weeks   Status New   PT SHORT TERM GOAL #4   Title Patient to be independent in correctly and consistently performing appropriate HEP, to be updated PRN    Time 3   Period Weeks   Status New           PT Long Term Goals - 08/21/15 1745    PT LONG TERM GOAL #1   Title Patient to demonstrate 5/5 strength in all ankle motions in order to demonstrate enhanced stability and reduce overall pain    Time 6   Period Weeks   Status New   PT LONG TERM GOAL #2   Title Patient to experience no more than 3/10 pain L foot/ankle after transitioning from sit to stand and when performing an entire shift at work in order to [employer] functional task performance and interaction with environment    Time 6   Period Weeks   Status New   PT LONG TERM GOAL #3   Title Patient to be independent with plantar fasciitis management methods including but not limited  to ice massage,  plantar fasciia stretch, soft tissue mobilization, in order to enhance her independence in dealing with this chronic condition    Time 6   Period Weeks   Status New   PT LONG TERM GOAL #4   Title Patient to be able to tolerate at least 20 minutes of light to moderate activity at least 3 times per week with no increase in L foot/ankle pain in order to maintain functional gains and assist in improve overall health status    Time 6   Period Weeks   Status New               Plan - 08/21/15 1705    Clinical Impression Statement Patient arrives with recent exacerbation of L plantar fasciitis, and presents with L ankle and foot pain and stiffness, impaired gait and posture, reduce strength especially in plantar flexor groups, and reduce functional task performance due to pain at this time. She reports that this has been a long term on going issue  for which she has received multiple injections; she also reports she is no longer able to walk barefoot due to pain. Her job is difficult due to high amount of time spent on her feet  and relation to pain. At this time she will benefit from skilled PT services in order to address functional limitations and to assist her in reaching an optimal level of function.    Pt will benefit from skilled therapeutic intervention in order to improve on the following deficits Abnormal gait;Decreased activity tolerance;Decreased mobility;Decreased range of motion;Decreased strength;Difficulty walking;Pain   Rehab Potential Good   PT Frequency 2x / week   PT Duration 6 weeks   PT Treatment/Interventions ADLs/Self Care Home Management;Cryotherapy;Ultrasound;Gait training;Stair training;Functional mobility training;Therapeutic activities;Therapeutic exercise;Balance training;Neuromuscular re-education;Patient/family education;Orthotic Fit/Training;Manual techniques;Taping   PT Next Visit Plan review HEP and goals; functional mobility exercises, stretches,  manual    PT Home Exercise Plan given    Consulted and Agree with Plan of Care Patient         Problem List Patient Active Problem List   Diagnosis Date Noted  . Medial meniscus tear   . Lateral meniscus tear, current   . Erosive lichen planus of vulva 08/28/2014  . 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 Right hip flexor tightness 12/06/2013  . Plantar fasciitis 12/06/2013    Nedra Hai PT, DPT [phone]  [facility] [address] [place], [place], [address] Phone: [phone]   Fax:  [phone]  Name: [name] MRN: [id number] Date of Birth: 06/14/1966

## 2015-08-21 NOTE — Patient Instructions (Signed)
Ankle Inversion    With ankle movement only, move left foot so sole of foot faces inward. Repeat _10___ times per session. Do _2___ sessions per day. Position: Standing   Copyright  VHI. All rights reserved.   Ankle Eversion    With ankle movement only, move left foot so sole of foot faces outward. Repeat __10__ times per day. Do __2__ sessions per day. Position: Standing   Copyright  VHI. All rights reserved.     Ankle circles  Rotate ankle in a clockwise and counter clockwise direction.   Rotate 10 times clockwise and 10 times counterclockwise; repeat twice a day.    ANKLE ABC's   While in a seated position, write out the alphabet in the air leading with your toes.  Your ankle should be moving as you perform this.  Repeat twice a day.    Seated Towel Scrunches with foot.  Sit with a towel on a smooth surface. Place your foot on the towel. Using your toes, scrunch the towel up. Unfold fold the towel and repeat the process.  Repeat 10 times, twice a day.

## 2015-08-30 ENCOUNTER — Ambulatory Visit (HOSPITAL_COMMUNITY): Payer: BLUE CROSS/BLUE SHIELD | Attending: Podiatry | Admitting: Physical Therapy

## 2015-08-30 DIAGNOSIS — Z9889 Other specified postprocedural states: Secondary | ICD-10-CM | POA: Diagnosis present

## 2015-08-30 DIAGNOSIS — R29898 Other symptoms and signs involving the musculoskeletal system: Secondary | ICD-10-CM | POA: Diagnosis present

## 2015-08-30 DIAGNOSIS — M25672 Stiffness of left ankle, not elsewhere classified: Secondary | ICD-10-CM | POA: Diagnosis present

## 2015-08-30 DIAGNOSIS — M722 Plantar fascial fibromatosis: Secondary | ICD-10-CM | POA: Diagnosis present

## 2015-08-30 DIAGNOSIS — M25662 Stiffness of left knee, not elsewhere classified: Secondary | ICD-10-CM | POA: Insufficient documentation

## 2015-08-30 DIAGNOSIS — M6281 Muscle weakness (generalized): Secondary | ICD-10-CM

## 2015-08-30 DIAGNOSIS — R262 Difficulty in walking, not elsewhere classified: Secondary | ICD-10-CM | POA: Insufficient documentation

## 2015-08-30 DIAGNOSIS — M79672 Pain in left foot: Secondary | ICD-10-CM

## 2015-08-30 NOTE — Therapy (Signed)
[facility] [address] [place], [place], [address] Phone: [phone]   Fax:  [phone]  Physical Therapy Treatment  Patient Details  Name: [name] MRN: [id number] Date of Birth: 09/10/1966 Referring Provider: Dr. Nolen MuMcKinney   Encounter Date: 08/30/2015      PT End of Session - 08/30/15 0843    Visit Number 2   Number of Visits 12   Date for PT Re-Evaluation 09/18/15   Authorization Type BCBS   Authorization Time Period 08/21/15 to 10/21/15   PT Start Time 0802   PT Stop Time 0841   PT Time Calculation (min) 39 min   Activity Tolerance Patient tolerated treatment well   Behavior During Therapy [facility] for tasks assessed/performed      Past Medical History  Diagnosis Date  . COPD (chronic obstructive pulmonary disease)   . Hypertension   . Thyroid disease   . Sleep apnea     pt DX but did not want to use CPAP.  Marland Kitchen. Hypothyroidism   . Arthritis     Past Surgical History  Procedure Laterality Date  . Nasal sinus surgery    . Knee arthroscopy with medial menisectomy Left 05/04/2015    Procedure: KNEE ARTHROSCOPY WITH MEDIAL AND LATERAL MENISECTOMY;  Surgeon: Vickki HearingStanley E Harrison, MD;  Location: [facility];  Service: Orthopedics;  Laterality: Left;    There were no vitals filed for this visit.  Visit Diagnosis:  Plantar fasciitis of left foot  Left foot pain  Difficulty walking  Ankle stiffness, left  Muscle weakness      Subjective Assessment - 08/30/15 0805    Subjective Patient reports that she is not feeling too bad today, is planning on working this weekend tho    Currently in Pain? Yes   Pain Score 5    Pain Location Foot   Pain Orientation Left                         [facility] Adult PT Treatment/Exercise - 08/30/15 0001    Knee/Hip Exercises: Stretches   Active Hamstring Stretch 2 reps;30 seconds   Active Hamstring Stretch Limitations 12 inch box    Piriformis Stretch 30 seconds;1 rep   Piriformis Stretch Limitations seated    Gastroc Stretch 3 reps;30 seconds   Gastroc Stretch Limitations slantboard   Manual Therapy   Manual Therapy Joint mobilization   Joint Mobilization L foot supination/pronation, calcaneal mobs, talar dorsiflexion mobs all grade 3    Soft tissue mobilization soft tissue work to [employer]L gastroc and tib posterior    Ankle Exercises: Stretches   Plantar Fascia Stretch 3 reps;30 seconds   Ankle Exercises: Standing   Rocker Board Limitations x20AP and lateral, U HHA    Heel Walk (Round Trip) 2815ft x 2 laps    Ankle Exercises: Seated   ABC's 2 reps   Ankle Circles/Pumps Left;15 reps   Ankle Circles/Pumps Limitations CW and CCW    Other Seated Ankle Exercises ankle dorsiflexion/plantarflexion 1x15, ankle eversion/inversion 1x15                PT Education - 08/30/15 [phone]    Education provided Yes   Education Details reviewed initial eval and goals   Person(s) Educated Patient   Methods Explanation   Comprehension Verbalized understanding          PT Short Term Goals - 08/21/15 1743    PT SHORT TERM GOAL #1   Title Patient  to experience no more than 6/10 pain at worst after she has first gotten up from sitting or during her shift at work in order to enhance job performance and overall mobilty    Time 3   Period Weeks   Status New   PT SHORT TERM GOAL #2   Title Patient to demonstrate L and R ankle dorsiflexion range of motion of at least 15 degrees bilaterally in order to improve gait, reduce joint stiffness, and improve mobiltiy    Time 3   Period Weeks   Status New   PT SHORT TERM GOAL #3   Title L ankle inversion and eversion range of motion to match that of R ankle in order to improve mechanics and reduce mechanical stiffness    Time 3   Period Weeks   Status New   PT SHORT TERM GOAL #4   Title Patient to be independent in correctly and consistently performing appropriate HEP, to be updated PRN    Time 3   Period Weeks   Status  New           PT Long Term Goals - 08/21/15 1745    PT LONG TERM GOAL #1   Title Patient to demonstrate 5/5 strength in all ankle motions in order to demonstrate enhanced stability and reduce overall pain    Time 6   Period Weeks   Status New   PT LONG TERM GOAL #2   Title Patient to experience no more than 3/10 pain L foot/ankle after transitioning from sit to stand and when performing an entire shift at work in order to [employer] functional task performance and interaction with environment    Time 6   Period Weeks   Status New   PT LONG TERM GOAL #3   Title Patient to be independent with plantar fasciitis management methods including but not limited to ice massage, plantar fasciia stretch, soft tissue mobilization, in order to enhance her independence in dealing with this chronic condition    Time 6   Period Weeks   Status New   PT LONG TERM GOAL #4   Title Patient to be able to tolerate at least 20 minutes of light to moderate activity at least 3 times per week with no increase in L foot/ankle pain in order to maintain functional gains and assist in improve overall health status    Time 6   Period Weeks   Status New               Plan - 08/30/15 0843    Clinical Impression Statement Introduced functional stretches and exercises today with good tolerance by patient, who did not appear to demonstrate signs of increased pain throughout session. Also performed manual therapy to L calf and foot/ankle in order to reduce mechanical stresses taht might be contributing to plantar fasciitis symptoms. Reviewed intial eval and goals. Manual and exercise perforemd sepeartely from each otehr this session.    Pt will benefit from skilled therapeutic intervention in order to improve on the following deficits Abnormal gait;Decreased activity tolerance;Decreased mobility;Decreased range of motion;Decreased strength;Difficulty walking;Pain   Rehab Potential Good   PT Frequency 2x / week   PT  Duration 6 weeks   PT Treatment/Interventions ADLs/Self Care Home Management;Cryotherapy;Ultrasound;Gait training;Stair training;Functional mobility training;Therapeutic activities;Therapeutic exercise;Balance training;Neuromuscular re-education;Patient/family education;Orthotic Fit/Training;Manual techniques;Taping   PT Next Visit Plan  functional mobility exercises, stretches, manual    PT Home Exercise Plan given    Consulted and Agree with Plan of  Care Patient        Problem List Patient Active Problem List   Diagnosis Date Noted  . Medial meniscus tear   . Lateral meniscus tear, current   . Erosive lichen planus of vulva 08/28/2014  . 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 Right hip flexor tightness 12/06/2013  . Plantar fasciitis 12/06/2013    Nedra Hai PT, DPT [phone]  [facility] [address] [place], [place], [address] Phone: [phone]   Fax:  [phone]  Name: [name] MRN: [id number] Date of Birth: April 17, 1966

## 2015-08-31 ENCOUNTER — Ambulatory Visit (HOSPITAL_COMMUNITY): Payer: BLUE CROSS/BLUE SHIELD

## 2015-08-31 DIAGNOSIS — M722 Plantar fascial fibromatosis: Secondary | ICD-10-CM | POA: Diagnosis not present

## 2015-08-31 DIAGNOSIS — M25672 Stiffness of left ankle, not elsewhere classified: Secondary | ICD-10-CM

## 2015-08-31 DIAGNOSIS — R262 Difficulty in walking, not elsewhere classified: Secondary | ICD-10-CM

## 2015-08-31 DIAGNOSIS — M6281 Muscle weakness (generalized): Secondary | ICD-10-CM

## 2015-08-31 DIAGNOSIS — M79672 Pain in left foot: Secondary | ICD-10-CM

## 2015-08-31 NOTE — Therapy (Signed)
[facility] [address] [place], [place], [address] Phone: [phone]   Fax:  [phone]  Physical Therapy Treatment  Patient Details  Name: [name] MRN: [id number] Date of Birth: [birth year] Referring Artyom Stencel: Dr. Nolen Mu   Encounter Date: 08/31/2015      PT End of Session - 08/31/15 1825    Visit Number 3   Number of Visits 12   Date for PT Re-Evaluation 09/18/15   Authorization Type BCBS   Authorization Time Period 08/21/15 to 10/21/15   PT Start Time 1348   PT Stop Time 1432   PT Time Calculation (min) 44 min   Activity Tolerance Patient tolerated treatment well   Behavior During Therapy [name] for tasks assessed/performed      Past Medical History  Diagnosis Date  . COPD (chronic obstructive pulmonary disease)   . Hypertension   . Thyroid disease   . Sleep apnea     pt DX but did not want to use CPAP.  Marland Kitchen Hypothyroidism   . Arthritis     Past Surgical History  Procedure Laterality Date  . Nasal sinus surgery    . Knee arthroscopy with medial menisectomy Left 05/04/2015    Procedure: KNEE ARTHROSCOPY WITH MEDIAL AND LATERAL MENISECTOMY;  Surgeon: Vickki Hearing, MD;  Location: [facility];  Service: Orthopedics;  Laterality: Left;    There were no vitals filed for this visit.  Visit Diagnosis:  Plantar fasciitis of left foot  Left foot pain  Difficulty walking  Ankle stiffness, left  Muscle weakness      Subjective Assessment - 08/31/15 1743    Subjective Pt stated Lt foot is sore today, pain scale 6/10   Currently in Pain? Yes   Pain Score 6    Pain Location Foot   Pain Orientation Left   Pain Descriptors / Indicators Sore;Aching          [facility] Adult PT Treatment/Exercise - 08/31/15 0001    Knee/Hip Exercises: Stretches   Active Hamstring Stretch 2 reps;30 seconds   Active Hamstring Stretch Limitations 12 inch box    Manual Therapy   Manual Therapy Myofascial release;Soft tissue mobilization    Soft tissue mobilization soft tissue work to L gastroc/soleus complex and plantar aspect of foot   Ankle Exercises: Stretches   Plantar Fascia Stretch 3 reps;30 seconds   Ankle Exercises: Standing   Rocker Board 2 minutes   Rocker Board Limitations R/L and A/P   Heel Raises 15 reps   Heel Walk (Round Trip) 2RT   Toe Walk (Round Trip) 2RT   Ankle Exercises: Seated   Towel Inversion/Eversion 3 reps            PT Short Term Goals - 08/21/15 1743    PT SHORT TERM GOAL #1   Title [name] in order to enhance job performance and overall mobilty    Time 3   Period Weeks   Status New   PT SHORT TERM GOAL #2   Title Patient to demonstrate L and R ankle dorsiflexion range of motion of at least 15 degrees bilaterally in order to improve gait, reduce joint stiffness, and improve mobiltiy    Time 3   Period Weeks   Status New   PT SHORT TERM GOAL #3   Title L ankle inversion and eversion range of motion to  match that of R ankle in order to improve mechanics and reduce mechanical stiffness    Time 3   Period Weeks   Status New   PT SHORT TERM GOAL #4   Title Patient to be independent in correctly and consistently performing appropriate HEP, to be updated PRN    Time 3   Period Weeks   Status New           PT Long Term Goals - 08/21/15 1745    PT LONG TERM GOAL #1   Title Patient to demonstrate 5/5 strength in all ankle motions in order to demonstrate enhanced stability and reduce overall pain    Time 6   Period Weeks   Status New   PT LONG TERM GOAL #2   Title Patient to experience no more than 3/10 pain L foot/ankle after transitioning from sit to stand and when performing an entire shift at work in order to [employer] functional task performance and interaction with environment    Time 6   Period Weeks   Status New   PT LONG TERM GOAL #3   Title Patient to  be independent with plantar fasciitis management methods including but not limited to ice massage, plantar fasciia stretch, soft tissue mobilization, in order to enhance her independence in dealing with this chronic condition    Time 6   Period Weeks   Status New   PT LONG TERM GOAL #4   Title Patient to be able to tolerate at least 20 minutes of light to moderate activity at least 3 times per week with no increase in L foot/ankle pain in order to maintain functional gains and assist in improve overall health status    Time 6   Period Weeks   Status New               Plan - 08/31/15 1826    Clinical Impression Statement Progressed ankle strengthening and functional stretches to improve ankle mobilty and reduce pain with gait.  Added toe walking with no reports of increased pain for gastroc strengthening.  Ended session with manual technqiues to gastroc/soleus complex and plantar aspect of foot to improve lengthening of musculature and reduce tightness, noted tightness gastroc/soleus complex.  End of session pt reports pain reduced to 2/10.     PT Next Visit Plan  functional mobility exercises, stretches, manual         Problem List Patient Active Problem List   Diagnosis Date Noted  . Medial meniscus tear   . Lateral meniscus tear, current   . Erosive lichen planus of vulva 08/28/2014  . 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 Right hip flexor tightness 12/06/2013  . Plantar fasciitis 12/06/2013   Becky Saxasey Cockerham, LPTA; CBIS [phone]  Juel BurrowCockerham, Casey Jo 08/31/2015, 6:33 PM  [facility] [facility] [address] [place], [place], [address] Phone: [phone]   Fax:  [phone]  Name: [name] MRN: [id number] Date of Birth: 02/23/1966

## 2015-09-04 ENCOUNTER — Ambulatory Visit (HOSPITAL_COMMUNITY): Payer: BLUE CROSS/BLUE SHIELD

## 2015-09-04 DIAGNOSIS — R262 Difficulty in walking, not elsewhere classified: Secondary | ICD-10-CM

## 2015-09-04 DIAGNOSIS — M722 Plantar fascial fibromatosis: Secondary | ICD-10-CM

## 2015-09-04 DIAGNOSIS — M6281 Muscle weakness (generalized): Secondary | ICD-10-CM

## 2015-09-04 DIAGNOSIS — M79672 Pain in left foot: Secondary | ICD-10-CM

## 2015-09-04 DIAGNOSIS — M25672 Stiffness of left ankle, not elsewhere classified: Secondary | ICD-10-CM

## 2015-09-04 NOTE — Therapy (Signed)
[employer] [facility] [address] [place], [place], [address] Phone: [phone]   Fax:  [phone]  Physical Therapy Treatment  Patient Details  Name: [name] MRN: [id number] Date of Birth: [birth year] Referring Provider: Dr. Nolen Mu   Encounter Date: 09/04/2015      PT End of Session - 09/04/15 1359    Visit Number 4   Number of Visits 12   Date for PT Re-Evaluation 09/18/15   Authorization Type BCBS   Authorization Time Period 08/21/15 to 10/21/15   PT Start Time 1350   PT Stop Time 1430   PT Time Calculation (min) 40 min   Activity Tolerance Patient tolerated treatment well   Behavior During Therapy [facility] for tasks assessed/performed      Past Medical History  Diagnosis Date  . COPD (chronic obstructive pulmonary disease)   . Hypertension   . Thyroid disease   . Sleep apnea     pt DX but did not want to use CPAP.  Marland Kitchen Hypothyroidism   . Arthritis     Past Surgical History  Procedure Laterality Date  . Nasal sinus surgery    . Knee arthroscopy with medial menisectomy Left 05/04/2015    Procedure: KNEE ARTHROSCOPY WITH MEDIAL AND LATERAL MENISECTOMY;  Surgeon: Vickki Hearing, MD;  Location: [facility];  Service: Orthopedics;  Laterality: Left;    There were no vitals filed for this visit.  Visit Diagnosis:  Plantar fasciitis of left foot  Left foot pain  Difficulty walking  Ankle stiffness, left  Muscle weakness      Subjective Assessment - 09/04/15 1357    Subjective Pt stated Lt foot is feeling better today, pain scale 3/10 achey heel pain   Currently in Pain? Yes   Pain Score 3    Pain Location Heel   Pain Orientation Left   Pain Descriptors / Indicators Aching            [facility] Adult PT Treatment/Exercise - 09/04/15 0001    Manual Therapy   Manual Therapy Myofascial release;Soft tissue mobilization   Soft tissue mobilization soft tissue work to L gastroc/soleus complex and plantar aspect of foot   Ankle Exercises: Stretches   Plantar Fascia Stretch 3 reps;30 seconds   Slant Board Stretch 3 reps;30 seconds   Ankle Exercises: Standing   BAPS Standing;Level 2;10 reps  all direction 10x R/L and A/P; 5x with CW/CCW   Rocker Board 2 minutes   Rocker Board Limitations R/L and A/P   Heel Raises 15 reps   Heel Walk (Round Trip) 2RT   Toe Walk (Round Trip) 2RT           PT Short Term Goals - 08/21/15 1743    PT SHORT TERM GOAL #1   Title [name] in order to enhance job performance and overall mobilty    Time 3   Period Weeks   Status New   PT SHORT TERM GOAL #2   Title Patient to demonstrate L and R ankle dorsiflexion range of motion of at least 15 degrees bilaterally in order to improve gait, reduce joint stiffness, and improve mobiltiy    Time 3   Period Weeks   Status New   PT SHORT TERM GOAL #3   Title L ankle inversion and eversion range of motion to match that of R ankle in  order to improve mechanics and reduce mechanical stiffness    Time 3   Period Weeks   Status New   PT SHORT TERM GOAL #4   Title Patient to be independent in correctly and consistently performing appropriate HEP, to be updated PRN    Time 3   Period Weeks   Status New           PT Long Term Goals - 08/21/15 1745    PT LONG TERM GOAL #1   Title Patient to demonstrate 5/5 strength in all ankle motions in order to demonstrate enhanced stability and reduce overall pain    Time 6   Period Weeks   Status New   PT LONG TERM GOAL #2   Title Patient to experience no more than 3/10 pain L foot/ankle after transitioning from sit to stand and when performing an entire shift at work in order to [employer] functional task performance and interaction with environment    Time 6   Period Weeks   Status New   PT LONG TERM GOAL #3   Title Patient to be independent with plantar fasciitis  management methods including but not limited to ice massage, plantar fasciia stretch, soft tissue mobilization, in order to enhance her independence in dealing with this chronic condition    Time 6   Period Weeks   Status New   PT LONG TERM GOAL #4   Title Patient to be able to tolerate at least 20 minutes of light to moderate activity at least 3 times per week with no increase in L foot/ankle pain in order to maintain functional gains and assist in improve overall health status    Time 6   Period Weeks   Status New               Plan - 09/04/15 1431    Clinical Impression Statement Session focus on improving ankle mobility and progressed strengthening and proprioception this session.  Added standing BAPS board to improve ankle proprioception with cueing to reduce compensation with hip movements.   Ended session with manual soft tissue mobilization techniques to reduce tightness on gastroc/soleux complex and plantar aspect of foot, able to reduce fascial restrictions on plantar aspect near heel with reports of pain reduced.  End of session pt stated pain reduced to 1/10.  Pt encouraged to continue stretches and HEP.     PT Next Visit Plan  functional mobility exercises, stretches, manual         Problem List Patient Active Problem List   Diagnosis Date Noted  . Medial meniscus tear   . Lateral meniscus tear, current   . Erosive lichen planus of vulva 08/28/2014  . 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 Right hip flexor tightness 12/06/2013  . Plantar fasciitis 12/06/2013   Becky Saxasey Hermenegildo Clausen, LPTA; CBIS [phone]  Juel BurrowCockerham, Luian Schumpert Jo 09/04/2015, 2:37 PM  [place] [facility] [address] [place], [place], [address] Phone: [phone]   Fax:  [phone]  Name: [name] MRN: [id number] Date of Birth: 03/21/1966

## 2015-09-05 ENCOUNTER — Ambulatory Visit (HOSPITAL_COMMUNITY): Payer: BLUE CROSS/BLUE SHIELD | Admitting: Physical Therapy

## 2015-09-05 DIAGNOSIS — M6281 Muscle weakness (generalized): Secondary | ICD-10-CM

## 2015-09-05 DIAGNOSIS — R262 Difficulty in walking, not elsewhere classified: Secondary | ICD-10-CM

## 2015-09-05 DIAGNOSIS — M79672 Pain in left foot: Secondary | ICD-10-CM

## 2015-09-05 DIAGNOSIS — M25672 Stiffness of left ankle, not elsewhere classified: Secondary | ICD-10-CM

## 2015-09-05 DIAGNOSIS — M722 Plantar fascial fibromatosis: Secondary | ICD-10-CM | POA: Diagnosis not present

## 2015-09-05 NOTE — Therapy (Signed)
[place] [facility] [address] [place], [place], [address] Phone: [phone]   Fax:  [phone]  Physical Therapy Treatment  Patient Details  Name: [name] MRN: [id number] Date of Birth: 05/10/1966 Referring Provider: Dr. Nolen MuMcKinney   Encounter Date: 09/05/2015      PT End of Session - 09/05/15 1708    Visit Number 5   Number of Visits 12   Date for PT Re-Evaluation 09/18/15   Authorization Type BCBS   Authorization Time Period 08/21/15 to 10/21/15   PT Start Time 1604   PT Stop Time 1645   PT Time Calculation (min) 41 min   Activity Tolerance Patient tolerated treatment well   Behavior [facility] for tasks assessed/performed      Past Medical History  Diagnosis Date  . COPD (chronic obstructive pulmonary disease)   . Hypertension   . Thyroid disease   . Sleep apnea     pt DX but did not want to use CPAP.  Marland Kitchen. Hypothyroidism   . Arthritis     Past Surgical History  Procedure Laterality Date  . Nasal sinus surgery    . Knee arthroscopy with medial menisectomy Left 05/04/2015    Procedure: KNEE ARTHROSCOPY WITH MEDIAL AND LATERAL MENISECTOMY;  Surgeon: Vickki HearingStanley E Harrison, MD;  Location: [facility];  Service: Orthopedics;  Laterality: Left;    There were no vitals filed for this visit.  Visit Diagnosis:  Plantar fasciitis of left foot  Left foot pain  Difficulty walking  Ankle stiffness, left  Muscle weakness      Subjective Assessment - 09/05/15 1711    Subjective Pt states her foot feels basically the same.  No significant changes.  3/10 pain in Lt heel   Currently in Pain? Yes   Pain Score 3    Pain Location Heel   Pain Orientation Left                         [facility] Adult PT Treatment/Exercise - 09/05/15 0001    Knee/Hip Exercises: Stretches   Active Hamstring Stretch 2 reps;30 seconds   Active Hamstring Stretch Limitations 12 inch box    Gastroc Stretch 3 reps;30 seconds   Gastroc  Stretch Limitations slantboard   Other Knee/Hip Stretches --   Manual Therapy   Manual Therapy Myofascial release;Soft tissue mobilization   Joint Mobilization L foot supination/pronation, calcaneal mobs, talar dorsiflexion mobs all grade 3    Soft tissue mobilization soft tissue work to L gastroc/soleus complex and plantar aspect of foot   Ankle Exercises: Stretches   Plantar Fascia Stretch 3 reps;30 seconds   Slant Board Stretch 3 reps;30 seconds   Ankle Exercises: Standing   BAPS Standing;Level 2;10 reps   Heel Walk (Round Trip) 2RT   Toe Walk (Round Trip) 2RT                  PT Short Term Goals - 08/21/15 1743    PT SHORT TERM GOAL #1   Title [name] in order to enhance job performance and overall mobilty    Time 3   Period Weeks   Status New   PT SHORT TERM GOAL #2   Title Patient to demonstrate L and R ankle dorsiflexion range of motion of at least 15 degrees bilaterally in order to  improve gait, reduce joint stiffness, and improve mobiltiy    Time 3   Period Weeks   Status New   PT SHORT TERM GOAL #3   Title L ankle inversion and eversion range of motion to match that of R ankle in order to improve mechanics and reduce mechanical stiffness    Time 3   Period Weeks   Status New   PT SHORT TERM GOAL #4   Title Patient to be independent in correctly and consistently performing appropriate HEP, to be updated PRN    Time 3   Period Weeks   Status New           PT Long Term Goals - 08/21/15 1745    PT LONG TERM GOAL #1   Title Patient to demonstrate 5/5 strength in all ankle motions in order to demonstrate enhanced stability and reduce overall pain    Time 6   Period Weeks   Status New   PT LONG TERM GOAL #2   Title Patient to experience no more than 3/10 pain L foot/ankle after transitioning from sit to stand and when performing an entire  shift at work in order to [employer] functional task performance and interaction with environment    Time 6   Period Weeks   Status New   PT LONG TERM GOAL #3   Title Patient to be independent with plantar fasciitis management methods including but not limited to ice massage, plantar fasciia stretch, soft tissue mobilization, in order to enhance her independence in dealing with this chronic condition    Time 6   Period Weeks   Status New   PT LONG TERM GOAL #4   Title Patient to be able to tolerate at least 20 minutes of light to moderate activity at least 3 times per week with no increase in L foot/ankle pain in order to maintain functional gains and assist in improve overall health status    Time 6   Period Weeks   Status New               Plan - 09/05/15 1708    Clinical Impression Statement Continued focus on improving ankle/foot stability and ROM into gastroc/soleus complex.  Pt continues to present with antalgic gait.  spoke to patient regarding need for more supportive shoes vs. insoles as she is pronated.  Pt very tight in instep/medial aspect of arch and foot.  Overall improvement voiced following manula techniques.    PT Next Visit Plan  functional mobility exercises, stretches, manual.  Add SLS and vector stance next session.         Problem List Patient Active Problem List   Diagnosis Date Noted  . Medial meniscus tear   . Lateral meniscus tear, current   . Erosive lichen planus of vulva 08/28/2014  . 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 Right hip flexor tightness 12/06/2013  . Plantar fasciitis 12/06/2013    Lurena Nida, PTA/CLT [phone]  09/05/2015, 5:14 PM  [facility] [address] [place], [place], [address] Phone: [phone]   Fax:  [phone]  Name: [name] MRN: [id number] Date of Birth: 1965-12-22

## 2015-09-11 ENCOUNTER — Ambulatory Visit (HOSPITAL_COMMUNITY): Payer: BLUE CROSS/BLUE SHIELD | Admitting: Physical Therapy

## 2015-09-11 DIAGNOSIS — M25662 Stiffness of left knee, not elsewhere classified: Secondary | ICD-10-CM

## 2015-09-11 DIAGNOSIS — Z9889 Other specified postprocedural states: Secondary | ICD-10-CM

## 2015-09-11 DIAGNOSIS — M25672 Stiffness of left ankle, not elsewhere classified: Secondary | ICD-10-CM

## 2015-09-11 DIAGNOSIS — R262 Difficulty in walking, not elsewhere classified: Secondary | ICD-10-CM

## 2015-09-11 DIAGNOSIS — M79672 Pain in left foot: Secondary | ICD-10-CM

## 2015-09-11 DIAGNOSIS — M722 Plantar fascial fibromatosis: Secondary | ICD-10-CM

## 2015-09-11 DIAGNOSIS — M6281 Muscle weakness (generalized): Secondary | ICD-10-CM

## 2015-09-11 DIAGNOSIS — R29898 Other symptoms and signs involving the musculoskeletal system: Secondary | ICD-10-CM

## 2015-09-11 NOTE — Therapy (Signed)
[facility] [address] [place], [place], [address] Phone: [phone]   Fax:  [phone]  Physical Therapy Treatment  Patient Details  Name: [name] MRN: [id number] Date of Birth: 07-22-1966 Referring Provider: Dr. Nolen Mu   Encounter Date: 09/11/2015      PT End of Session - 09/11/15 1656    Visit Number 6   Number of Visits 12   Date for PT Re-Evaluation 09/18/15   Authorization Type BCBS   Authorization Time Period 08/21/15 to 10/21/15   PT Start Time 1610   PT Stop Time 1655   PT Time Calculation (min) 45 min   Activity Tolerance Patient tolerated treatment well   Behavior During Therapy [facility] for tasks assessed/performed      Past Medical History  Diagnosis Date  . COPD (chronic obstructive pulmonary disease)   . Hypertension   . Thyroid disease   . Sleep apnea     pt DX but did not want to use CPAP.  Marland Kitchen Hypothyroidism   . Arthritis     Past Surgical History  Procedure Laterality Date  . Nasal sinus surgery    . Knee arthroscopy with medial menisectomy Left 05/04/2015    Procedure: KNEE ARTHROSCOPY WITH MEDIAL AND LATERAL MENISECTOMY;  Surgeon: Vickki Hearing, MD;  Location: [facility];  Service: Orthopedics;  Laterality: Left;    There were no vitals filed for this visit.  Visit Diagnosis:  Plantar fasciitis of left foot  Left foot pain  Difficulty walking  Ankle stiffness, left  Muscle weakness  S/P left knee arthroscopy  Stiffness of left knee  Weakness of left leg      Subjective Assessment - 09/11/15 1659    Subjective PT states her foot does not bother her as long as she's moving it.  First thing in the morning or after prolonged immobility increases her pain.    Currently in Pain? No/denies                         [facility] Adult PT Treatment/Exercise - 09/11/15 1614    Knee/Hip Exercises: Stretches   Active Hamstring Stretch 2 reps;30 seconds   Active Hamstring Stretch  Limitations 12 inch box    Gastroc Stretch 3 reps;30 seconds   Gastroc Stretch Limitations slantboard   Manual Therapy   Manual Therapy Myofascial release;Soft tissue mobilization   Joint Mobilization L foot supination/pronation, calcaneal mobs, talar dorsiflexion mobs all grade 3    Soft tissue mobilization soft tissue work to L gastroc/soleus complex and plantar aspect of foot   Ankle Exercises: Stretches   Plantar Fascia Stretch 3 reps;30 seconds   Slant Board Stretch 3 reps;30 seconds   Ankle Exercises: Standing   BAPS Standing;Level 3;10 reps   Heel Raises 10 reps  Lt only   Heel Walk (Round Trip) 2RT   Toe Walk (Round Trip) 2RT                  PT Short Term Goals - 08/21/15 1743    PT SHORT TERM GOAL #1   Title [name] in order to enhance job performance and overall mobilty    Time 3   Period Weeks   Status New   PT SHORT TERM GOAL #2   Title Patient to demonstrate L and R ankle dorsiflexion  range of motion of at least 15 degrees bilaterally in order to improve gait, reduce joint stiffness, and improve mobiltiy    Time 3   Period Weeks   Status New   PT SHORT TERM GOAL #3   Title L ankle inversion and eversion range of motion to match that of R ankle in order to improve mechanics and reduce mechanical stiffness    Time 3   Period Weeks   Status New   PT SHORT TERM GOAL #4   Title Patient to be independent in correctly and consistently performing appropriate HEP, to be updated PRN    Time 3   Period Weeks   Status New           PT Long Term Goals - 08/21/15 1745    PT LONG TERM GOAL #1   Title Patient to demonstrate 5/5 strength in all ankle motions in order to demonstrate enhanced stability and reduce overall pain    Time 6   Period Weeks   Status New   PT LONG TERM GOAL #2   Title Patient to experience no more than 3/10 pain L  foot/ankle after transitioning from sit to stand and when performing an entire shift at work in order to [employer] functional task performance and interaction with environment    Time 6   Period Weeks   Status New   PT LONG TERM GOAL #3   Title Patient to be independent with plantar fasciitis management methods including but not limited to ice massage, plantar fasciia stretch, soft tissue mobilization, in order to enhance her independence in dealing with this chronic condition    Time 6   Period Weeks   Status New   PT LONG TERM GOAL #4   Title Patient to be able to tolerate at least 20 minutes of light to moderate activity at least 3 times per week with no increase in L foot/ankle pain in order to maintain functional gains and assist in improve overall health status    Time 6   Period Weeks   Status New               Plan - 09/11/15 1656    Clinical Impression Statement Focused on therex and manual techniques to improve ankle stability and mobility.  Pt with decreased antalgia today and reports compliance with HEP.  Less tightness noted in gastroc/soleus complex as compared to last session.   Added single leg heelraise with limited mobility and weakness noted.  Increased to level 3 on BAPS with good control noted.    PT Next Visit Plan  functional mobility exercises, stretches, manual.  Add SLS and vector stance next session.         Problem List Patient Active Problem List   Diagnosis Date Noted  . Medial meniscus tear   . Lateral meniscus tear, current   . Erosive lichen planus of vulva 08/28/2014  . 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 Right hip flexor tightness 12/06/2013  . Plantar fasciitis 12/06/2013    Lurena Nidamy B Kennon Encinas, PTA/CLT [phone]  09/11/2015, 5:01 PM  [place] [facility] [address] [place], [place], [address] Phone: [phone]   Fax:  [phone]  Name: [name] MRN: [id number] Date of Birth: 10/15/1965

## 2015-09-13 ENCOUNTER — Ambulatory Visit (INDEPENDENT_AMBULATORY_CARE_PROVIDER_SITE_OTHER): Payer: BLUE CROSS/BLUE SHIELD | Admitting: Orthopedic Surgery

## 2015-09-13 ENCOUNTER — Ambulatory Visit (HOSPITAL_COMMUNITY): Payer: BLUE CROSS/BLUE SHIELD | Admitting: Physical Therapy

## 2015-09-13 ENCOUNTER — Encounter: Payer: Self-pay | Admitting: Orthopedic Surgery

## 2015-09-13 VITALS — BP 135/80 | Ht 63.0 in | Wt 220.0 lb

## 2015-09-13 DIAGNOSIS — R262 Difficulty in walking, not elsewhere classified: Secondary | ICD-10-CM

## 2015-09-13 DIAGNOSIS — M659 Synovitis and tenosynovitis, unspecified: Secondary | ICD-10-CM

## 2015-09-13 DIAGNOSIS — M25462 Effusion, left knee: Secondary | ICD-10-CM | POA: Diagnosis not present

## 2015-09-13 DIAGNOSIS — M25672 Stiffness of left ankle, not elsewhere classified: Secondary | ICD-10-CM

## 2015-09-13 DIAGNOSIS — M722 Plantar fascial fibromatosis: Secondary | ICD-10-CM

## 2015-09-13 DIAGNOSIS — Z9889 Other specified postprocedural states: Secondary | ICD-10-CM | POA: Diagnosis not present

## 2015-09-13 DIAGNOSIS — M1712 Unilateral primary osteoarthritis, left knee: Secondary | ICD-10-CM

## 2015-09-13 DIAGNOSIS — M79672 Pain in left foot: Secondary | ICD-10-CM

## 2015-09-13 DIAGNOSIS — M6281 Muscle weakness (generalized): Secondary | ICD-10-CM

## 2015-09-13 MED ORDER — HYDROCODONE-ACETAMINOPHEN 5-325 MG PO TABS: 1.0000 | ORAL_TABLET | ORAL | Status: DC | PRN

## 2015-09-13 NOTE — Therapy (Signed)
[facility] [address] [place], [place], [address] Phone: [phone]   Fax:  [phone]  Physical Therapy Treatment  Patient Details  Name: [name] MRN: [id number] Date of Birth: [birth year] Referring Provider: Dr. Nolen Mu   Encounter Date: 09/13/2015      PT End of Session - 09/13/15 0936    Visit Number 7   Number of Visits 12   Date for PT Re-Evaluation 09/18/15   Authorization Type BCBS   Authorization Time Period 08/21/15 to 10/21/15   PT Start Time 0847   PT Stop Time 0932   PT Time Calculation (min) 45 min   Activity Tolerance Patient tolerated treatment well   Behavior During Therapy [facility] for tasks assessed/performed      Past Medical History  Diagnosis Date  . COPD (chronic obstructive pulmonary disease)   . Hypertension   . Thyroid disease   . Sleep apnea     pt DX but did not want to use CPAP.  Marland Kitchen Hypothyroidism   . Arthritis     Past Surgical History  Procedure Laterality Date  . Nasal sinus surgery    . Knee arthroscopy with medial menisectomy Left 05/04/2015    Procedure: KNEE ARTHROSCOPY WITH MEDIAL AND LATERAL MENISECTOMY;  Surgeon: Vickki Hearing, MD;  Location: [facility];  Service: Orthopedics;  Laterality: Left;    There were no vitals filed for this visit.  Visit Diagnosis:  Plantar fasciitis of left foot  Left foot pain  Difficulty walking  Ankle stiffness, left  Muscle weakness  S/P left knee arthroscopy      Subjective Assessment - 09/13/15 0852    Subjective Pt states she's not having any pain at the moment but did when she first got up.   Currently in Pain? No/denies                         [facility] Adult PT Treatment/Exercise - 09/13/15 0935    Knee/Hip Exercises: Stretches   Active Hamstring Stretch 30 seconds;3 reps   Active Hamstring Stretch Limitations 12 inch box    Gastroc Stretch 3 reps;30 seconds   Gastroc Stretch Limitations slantboard   Manual  Therapy   Manual Therapy Myofascial release;Soft tissue mobilization   Joint Mobilization L foot supination/pronation, calcaneal mobs, talar dorsiflexion mobs all grade 3    Soft tissue mobilization soft tissue work to L gastroc/soleus complex and plantar aspect of foot   Ankle Exercises: Stretches   Plantar Fascia Stretch 3 reps;30 seconds   Slant Board Stretch 3 reps;30 seconds   Ankle Exercises: Standing   BAPS Standing;Level 3;10 reps   Heel Raises 10 reps   Heel Walk (Round Trip) 2RT   Toe Walk (Round Trip) 2RT                  PT Short Term Goals - 08/21/15 1743    PT SHORT TERM GOAL #1   Title [name] in order to enhance job performance and overall mobilty    Time 3   Period Weeks   Status New   PT SHORT TERM GOAL #2   Title Patient to demonstrate L and R ankle dorsiflexion range of motion of at least 15 degrees bilaterally in order to improve gait, reduce joint stiffness, and improve mobiltiy    Time  3   Period Weeks   Status New   PT SHORT TERM GOAL #3   Title L ankle inversion and eversion range of motion to match that of R ankle in order to improve mechanics and reduce mechanical stiffness    Time 3   Period Weeks   Status New   PT SHORT TERM GOAL #4   Title Patient to be independent in correctly and consistently performing appropriate HEP, to be updated PRN    Time 3   Period Weeks   Status New           PT Long Term Goals - 08/21/15 1745    PT LONG TERM GOAL #1   Title Patient to demonstrate 5/5 strength in all ankle motions in order to demonstrate enhanced stability and reduce overall pain    Time 6   Period Weeks   Status New   PT LONG TERM GOAL #2   Title Patient to experience no more than 3/10 pain L foot/ankle after transitioning from sit to stand and when performing an entire shift at work in order to [employer] functional task  performance and interaction with environment    Time 6   Period Weeks   Status New   PT LONG TERM GOAL #3   Title Patient to be independent with plantar fasciitis management methods including but not limited to ice massage, plantar fasciia stretch, soft tissue mobilization, in order to enhance her independence in dealing with this chronic condition    Time 6   Period Weeks   Status New   PT LONG TERM GOAL #4   Title Patient to be able to tolerate at least 20 minutes of light to moderate activity at least 3 times per week with no increase in L foot/ankle pain in order to maintain functional gains and assist in improve overall health status    Time 6   Period Weeks   Status New               Plan - 09/13/15 [id number]    Clinical Impression Statement Continued focus on improving plantar fascia and gastroc/soleus mobility.  Joint mobs completed to increase resting eversion of foot as tends to remain in inverted position.  Pt with improving stability completing heel and toe walking actvities.   No antalgia noted today entering or leaving clinic.    PT Next Visit Plan  functional mobility exercises, stretches, manual.  Add SLS and vector stance next session.         Problem List Patient Active Problem List   Diagnosis Date Noted  . Medial meniscus tear   . Lateral meniscus tear, current   . Erosive lichen planus of vulva 08/28/2014  . 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 Right hip flexor tightness 12/06/2013  . Plantar fasciitis 12/06/2013    Lurena Nidamy B Berlie Persky, PTA/CLT [phone]  09/13/2015, 9:39 AM  [place] [facility] [address] [place], [place], [address] Phone: [phone]   Fax:  [phone]  Name: [name] MRN: [id number] Date of Birth: 08/10/1966

## 2015-09-13 NOTE — Progress Notes (Signed)
Follow-up visit patient had arthroscopy left knee in August 2016 she had torn medial and lateral meniscus  Chief Complaint  Patient presents with  . Knee Problem    left knee pain and swelling, SALK LM AND MM DOS 05/04/15    We found she had chondromalacia the patella grade 2 with chondromalacia of the medial and lateral femoral condyle and the medial and lateral meniscal tears  She went back to work  She is on diclofenac  She presents with intermittent aching medial and lateral left knee pain and swelling unrelieved by the diclofenac and the ice  Review of systems no catching locking giving way she does have some posterior leg pain on the left, neuro neg   Past Medical History  Diagnosis Date  . COPD (chronic obstructive pulmonary disease) (HCC)   . Hypertension   . Thyroid disease   . Sleep apnea     pt DX but did not want to use CPAP.  Marland Kitchen. Hypothyroidism   . Arthritis      BP 135/80 mmHg  Ht 5\' 3"  (1.6 m)  Wt 220 lb (99.791 kg)  BMI 38.98 kg/m2  LMP 04/14/2011  Gen. appearance her grooming is normal Oriented 3 Mood normal She has a slight limp Right Knee Exam   Range of Motion  The patient has normal right knee ROM.  Muscle Strength   The patient has normal right knee strength.  Other  Erythema: absent Scars: absent Sensation: normal Pulse: present Swelling: none   Left Knee Exam   Tenderness  The patient is experiencing tenderness in the medial joint line and lateral joint line.  Range of Motion  Extension: 5  Flexion: 120   Muscle Strength   The patient has normal left knee strength.  Tests  McMurray:  Medial - negative Lateral - negative Lachman:  Anterior - negative    Posterior - negative Drawer:       Anterior - negative     Posterior - negative Varus: negative Valgus: negative Patellar Apprehension: negative  Other  Erythema: absent Scars: absent Sensation: normal Pulse: present Swelling: moderate     Encounter Diagnoses   Name Primary?  . S/P left knee arthroscopy   . Knee effusion, left Yes  . Synovitis of knee   . Primary osteoarthritis of left knee    Procedure note injection and aspiration left knee joint  Verbal consent was obtained to aspirate and inject the left knee joint   Timeout was completed to confirm the site of aspiration and injection  An 18-gauge needle was used to aspirate the left knee joint from a suprapatellar lateral approach.  The medications used were 40 mg of Depo-Medrol and 1% lidocaine 3 cc  Anesthesia was provided by ethyl chloride and the skin was prepped with alcohol.  After cleaning the skin with alcohol an 18-gauge needle was used to aspirate the right knee joint.  We obtained 30 cc of clear yellow  fluid  We follow this by injection of 40 mg of Depo-Medrol and 3 cc 1% lidocaine.  There were no complications. A sterile bandage was applied.  Meds ordered this encounter  Medications  . HYDROcodone-acetaminophen (NORCO/VICODIN) 5-325 MG tablet    Sig: Take 1 tablet by mouth every 4 (four) hours as needed for moderate pain.    Dispense:  42 tablet    Refill:  0   F/u as needed

## 2015-09-18 ENCOUNTER — Ambulatory Visit (HOSPITAL_COMMUNITY): Payer: BLUE CROSS/BLUE SHIELD | Admitting: Physical Therapy

## 2015-09-20 ENCOUNTER — Ambulatory Visit (HOSPITAL_COMMUNITY): Payer: BLUE CROSS/BLUE SHIELD | Admitting: Physical Therapy

## 2015-09-20 DIAGNOSIS — M722 Plantar fascial fibromatosis: Secondary | ICD-10-CM | POA: Diagnosis not present

## 2015-09-20 DIAGNOSIS — M25672 Stiffness of left ankle, not elsewhere classified: Secondary | ICD-10-CM

## 2015-09-20 DIAGNOSIS — R262 Difficulty in walking, not elsewhere classified: Secondary | ICD-10-CM

## 2015-09-20 DIAGNOSIS — M6281 Muscle weakness (generalized): Secondary | ICD-10-CM

## 2015-09-20 DIAGNOSIS — M79672 Pain in left foot: Secondary | ICD-10-CM

## 2015-09-20 NOTE — Therapy (Signed)
[facility] [address] [place], [place], [address] Phone: [phone]   Fax:  [phone]  Physical Therapy Treatment (Re-Assessment)  Patient Details  Name: [name] MRN: [id number] Date of Birth: Apr 04, 1966 Referring Provider: Dr. Caprice Beaver   Encounter Date: 09/20/2015      PT End of Session - 09/20/15 1744    Visit Number 8   Number of Visits 12   Date for PT Re-Evaluation 10/18/15   Authorization Type BCBS   Authorization Time Period 08/21/15 to 10/21/15   PT Start Time 1647   PT Stop Time 1730   PT Time Calculation (min) 43 min   Activity Tolerance Patient tolerated treatment well   Behavior During Therapy [facility] for tasks assessed/performed      Past Medical History  Diagnosis Date  . COPD (chronic obstructive pulmonary disease) ([place])   . Hypertension   . Thyroid disease   . Sleep apnea     pt DX but did not want to use CPAP.  Marland Kitchen Hypothyroidism   . Arthritis     Past Surgical History  Procedure Laterality Date  . Nasal sinus surgery    . Knee arthroscopy with medial menisectomy Left 05/04/2015    Procedure: KNEE ARTHROSCOPY WITH MEDIAL AND LATERAL MENISECTOMY;  Surgeon: Carole Civil, MD;  Location: [facility];  Service: Orthopedics;  Laterality: Left;    There were no vitals filed for this visit.  Visit Diagnosis:  Plantar fasciitis of left foot  Left foot pain  Difficulty walking  Ankle stiffness, left  Muscle weakness      Subjective Assessment - 09/20/15 1649    Subjective Patient reports she is feeling better; reports that Dr. Caprice Beaver is happy with her progress and would like her to just finish up with scheduled appointemtns before being released.    Pertinent History Patient reports that plantar fasciitis has been going on for a long long time, ended up going to MD because it was hurting so bad she couldn't stand it. Was given shots, which did help with pain, and was sent to PT by MD.    Currently  in Pain? Yes   Pain Score 2             [facility] PT Assessment - 09/20/15 0001    Observation/Other Assessments   Focus on Therapeutic Outcomes (FOTO)  42% limited    AROM   Right Ankle Dorsiflexion 10   Right Ankle Plantar Flexion --  wfl    Right Ankle Inversion 30   Right Ankle Eversion 20   Left Ankle Dorsiflexion 11   Left Ankle Plantar Flexion --  wfl    Left Ankle Inversion 30   Left Ankle Eversion 15   Strength   Right Ankle Dorsiflexion 5/5   Right Ankle Inversion 5/5   Right Ankle Eversion 5/5   Left Ankle Dorsiflexion 5/5   Left Ankle Inversion 5/5   Left Ankle Eversion 5/5   Palpation   Palpation comment improved mobiilty today however both calcaneal mobs remain tight                      [facility] Adult PT Treatment/Exercise - 09/20/15 0001    Manual Therapy   Soft tissue mobilization soft tissue to L gastroc/soleus    Ankle Exercises: Stretches   Plantar Fascia Stretch 3 reps;30 seconds   Slant Board Stretch 3 reps;30 seconds   Ankle Exercises: Standing   BAPS Standing;Level 3;15 reps  Heel Walk (Round Trip) 3RT                 PT Education - 09/20/15 1742    Education provided Yes   Education Details progress with skilld PT services, plan of care moving forward    Person(s) Educated Patient   Methods Explanation   Comprehension Verbalized understanding          PT Short Term Goals - 09/20/15 1702    PT SHORT TERM GOAL #1   Title [name] in order to enhance job performance and overall mobilty    Baseline 12/29-3/10 with this activity    Time 3   Period Weeks   Status Achieved   PT SHORT TERM GOAL #2   Title Patient to demonstrate L and R ankle dorsiflexion range of motion of at least 15 degrees bilaterally in order to improve gait, reduce joint stiffness, and improve mobiltiy    Time 3   Period Weeks    Status On-going   PT SHORT TERM GOAL #3   Title L ankle inversion and eversion range of motion to match that of R ankle in order to improve mechanics and reduce mechanical stiffness    Time 3   Period Weeks   Status Partially Met   PT SHORT TERM GOAL #4   Title Patient to be independent in correctly and consistently performing appropriate HEP, to be updated PRN    Baseline 12/29- doing daily    Time 3   Period Weeks   Status Achieved           PT Long Term Goals - 09/20/15 1704    PT LONG TERM GOAL #1   Title Patient to demonstrate 5/5 strength in all ankle motions in order to demonstrate enhanced stability and reduce overall pain    Time 6   Period Weeks   Status Achieved   PT LONG TERM GOAL #2   Title Patient to experience no more than 3/10 pain L foot/ankle after transitioning from sit to stand and when performing an entire shift at work in order to [employer] functional task performance and interaction with environment    Baseline 12/29- 3/10 with transitions however 6/10 after full shift at work    Time 6   Period Weeks   Status Partially Met   PT [place] #3   Title Patient to be independent with plantar fasciitis management methods including but not limited to ice massage, plantar fasciia stretch, soft tissue mobilization, in order to enhance her independence in dealing with this chronic condition    Baseline 12/29- patient reports she is fairly comfortable with self-management techniques    Time 6   Period Weeks   Status Achieved   PT LONG TERM GOAL #4   Title Patient to be able to tolerate at least 20 minutes of light to moderate activity at least 3 times per week with no increase in L foot/ankle pain in order to maintain functional gains and assist in improve overall health status    Baseline 12/29- walking 15-20 minutes on treadmill each day at work on breaks plus walking for work during her shift    Time [place] - 09/20/15 [place]  Clinical Impression Statement Re-assessment performed today. Patient is vastly improved compared to initial evaluation and demonstrates gains in strength, mobility, and functional task performance in weight bearing tasks with foot. Performed functional exercises as well today, along with manual at end of sessionw hich was separate from all other treatments and assessments today. Patient also reports that her MD believes she is doing well enough to discharge skilled PT services after she finishes her  remaining scheduled sessions, which PT is in agreement with today.    Pt will benefit from skilled therapeutic intervention in order to improve on the following deficits Abnormal gait;Decreased activity tolerance;Decreased mobility;Decreased range of motion;Decreased strength;Difficulty walking;Pain   Rehab Potential Good   PT Frequency Other (comment)  4 more sessions    PT Duration Other (comment)  4 sessions    PT Treatment/Interventions ADLs/Self Care Home Management;Cryotherapy;Ultrasound;Gait training;Stair training;Functional mobility training;Therapeutic activities;Therapeutic exercise;Balance training;Neuromuscular re-education;Patient/family education;Orthotic Fit/Training;Manual techniques;Taping   PT Next Visit Plan  functional mobility exercises, stretches, manual.  Add SLS and vector stance next session. DC in 4 sessions and update HEP as go along.    PT Home Exercise Plan given    Consulted and Agree with Plan of Care Patient        Problem List Patient Active Problem List   Diagnosis Date Noted  . Medial meniscus tear   . Lateral meniscus tear, current   . Erosive lichen planus of vulva 08/28/2014  . 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 Right hip flexor tightness 12/06/2013  . Plantar fasciitis 12/06/2013    Physical Therapy Progress Note  Dates of Reporting Period: 08/21/15 to 09/20/15  Objective Reports of Subjective Statement: see above   Objective  Measurements: see above   Goal Update: see above   Plan: see above   Reason Skilled Services are Required: continue to work on joint stability, reduce muscle tightness and pain, create advanced HEP   Deniece Ree PT, DPT [place], [place], [address] Phone: [phone]   Fax:  [phone]  Name: [name] MRN: [id number] Date of Birth: 1966-01-28

## 2015-09-25 ENCOUNTER — Ambulatory Visit (HOSPITAL_COMMUNITY): Payer: BLUE CROSS/BLUE SHIELD | Attending: Podiatry | Admitting: Physical Therapy

## 2015-09-25 DIAGNOSIS — M6281 Muscle weakness (generalized): Secondary | ICD-10-CM

## 2015-09-25 DIAGNOSIS — M25662 Stiffness of left knee, not elsewhere classified: Secondary | ICD-10-CM | POA: Diagnosis present

## 2015-09-25 DIAGNOSIS — R29898 Other symptoms and signs involving the musculoskeletal system: Secondary | ICD-10-CM

## 2015-09-25 DIAGNOSIS — M79672 Pain in left foot: Secondary | ICD-10-CM | POA: Diagnosis present

## 2015-09-25 DIAGNOSIS — M722 Plantar fascial fibromatosis: Secondary | ICD-10-CM

## 2015-09-25 DIAGNOSIS — R262 Difficulty in walking, not elsewhere classified: Secondary | ICD-10-CM | POA: Diagnosis present

## 2015-09-25 DIAGNOSIS — Z9889 Other specified postprocedural states: Secondary | ICD-10-CM

## 2015-09-25 DIAGNOSIS — M25672 Stiffness of left ankle, not elsewhere classified: Secondary | ICD-10-CM | POA: Diagnosis present

## 2015-09-25 NOTE — Therapy (Signed)
[name], [place], [address] Phone: [phone]   Fax:  [phone]  Physical Therapy Treatment  Patient Details  Name: [name] MRN: [id number] Date of Birth: [birth year] Referring Provider: Dr. Caprice Beaver   Encounter Date: 09/25/2015      PT End of Session - 09/25/15 1651    Visit Number 9   Number of Visits 12   Date for PT Re-Evaluation 10/18/15   Authorization Type BC[employer]   Authorization Time Period 08/21/15 to 10/21/15   PT Start Time 1606   PT Stop Time 1646   PT Time Calculation (min) 40 min   Activity Tolerance Patient tolerated treatment well   Behavior During Therapy [facility] for tasks assessed/performed      Past Medical History  Diagnosis Date  . COPD (chronic obstructive pulmonary disease) ([place])   . Hypertension   . Thyroid disease   . Sleep apnea     pt DX but did not want to use CPAP.  Marland Kitchen Hypothyroidism   . Arthritis     Past Surgical History  Procedure Laterality Date  . Nasal sinus surgery    . Knee arthroscopy with medial menisectomy Left 05/04/2015    Procedure: KNEE ARTHROSCOPY WITH MEDIAL AND LATERAL MENISECTOMY;  Surgeon: Carole Civil, MD;  Location: [facility];  Service: Orthopedics;  Laterality: Left;    There were no vitals filed for this visit.  Visit Diagnosis:  Plantar fasciitis of left foot  Left foot pain  Difficulty walking  Muscle weakness  Ankle stiffness, left  S/P left knee arthroscopy  Stiffness of left knee  Weakness of left leg      Subjective Assessment - 09/25/15 1702    Subjective PT states she is feeling under the weather today; states her throat is scratchy.  currently with 3/10 pain in her Lt foot.                         [facility] Adult PT Treatment/Exercise - 09/25/15 1613    Knee/Hip Exercises: Stretches   Active Hamstring Stretch 30 seconds;3 reps   Active Hamstring Stretch Limitations 12 inch box    Gastroc Stretch 3  reps;30 seconds   Gastroc Stretch Limitations slantboard   Manual Therapy   Manual Therapy Myofascial release;Soft tissue mobilization   Joint Mobilization L foot supination/pronation, calcaneal mobs, talar dorsiflexion mobs all grade 3    Soft tissue mobilization soft tissue to L gastroc/soleus    Ankle Exercises: Standing   BAPS Standing;Level 3;15 reps   Heel Walk (Round Trip) 3RT    Ankle Exercises: Stretches   Plantar Fascia Stretch 3 reps;30 seconds   Slant Board Stretch 3 reps;30 seconds                  PT Short Term Goals - 09/20/15 1702    PT SHORT TERM GOAL #1   Title [name] in order to enhance job performance and overall mobilty    Baseline 12/29-3/10 with this activity    Time 3   Period Weeks   Status Achieved   PT SHORT TERM GOAL #2   Title Patient to demonstrate L and R ankle dorsiflexion range of motion of at least 15 degrees bilaterally in order to improve gait, reduce joint stiffness, and improve mobiltiy  Time 3   Period Weeks   Status On-going   PT SHORT TERM GOAL #3   Title L ankle inversion and eversion range of motion to match that of R ankle in order to improve mechanics and reduce mechanical stiffness    Time 3   Period Weeks   Status Partially Met   PT SHORT TERM GOAL #4   Title Patient to be independent in correctly and consistently performing appropriate HEP, to be updated PRN    Baseline 12/29- doing daily    Time 3   Period Weeks   Status Achieved           PT Long Term Goals - 09/20/15 1704    PT LONG TERM GOAL #1   Title Patient to demonstrate 5/5 strength in all ankle motions in order to demonstrate enhanced stability and reduce overall pain    Time 6   Period Weeks   Status Achieved   PT LONG TERM GOAL #2   Title Patient to experience no more than 3/10 pain L foot/ankle after transitioning from sit to  stand and when performing an entire shift at work in order to [employer] functional task performance and interaction with environment    Baseline 12/29- 3/10 with transitions however 6/10 after full shift at work    Time 6   Period Weeks   Status Partially [place] #3   Title Patient to be independent with plantar fasciitis management methods including but not limited to ice massage, plantar fasciia stretch, soft tissue mobilization, in order to enhance her independence in dealing with this chronic condition    Baseline 12/29- patient reports she is fairly comfortable with self-management techniques    Time 6   Period Weeks   Status Achieved   PT LONG TERM GOAL #4   Title Patient to be able to tolerate at least 20 minutes of light to moderate activity at least 3 times per week with no increase in L foot/ankle pain in order to maintain functional gains and assist in improve overall health status    Baseline 12/29- walking 15-20 minutes on treadmill each day at work on breaks plus walking for work during her shift    Time 6   Period Weeks   Status Achieved               Plan - 09/25/15 1652    Clinical Impression Statement continued to focus on improving stability and decreasing pain. Added SLS and vector stance to POC with max of 23" hold on Lt only without UE assist.  Continued with manual to decrease adhesions and help reduce pain and improve moblity of ankle and fascia/   Pt will benefit from skilled therapeutic intervention in order to improve on the following deficits Abnormal gait;Decreased activity tolerance;Decreased mobility;Decreased range of motion;Decreased strength;Difficulty walking;Pain   Rehab Potential Good   PT Frequency Other (comment)  4 more sessions    PT Duration Other (comment)  4 sessions    PT Treatment/Interventions ADLs/Self Care Home Management;Cryotherapy;Ultrasound;Gait training;Stair training;Functional mobility training;Therapeutic  activities;Therapeutic exercise;Balance training;Neuromuscular re-education;Patient/family education;Orthotic Fit/Training;Manual techniques;Taping   PT Next Visit Plan  functional mobility exercises, stretches, manual.  DC in 3 sessions and update HEP as go along.    PT Home Exercise Plan given    Consulted and Agree with Plan of Care Patient        Problem List Patient Active Problem List   Diagnosis Date Noted  . Medial  meniscus tear   . Lateral meniscus tear, current   . Erosive lichen planus of vulva 08/28/2014  . 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 Right hip flexor tightness 12/06/2013  . Plantar fasciitis 12/06/2013    Teena Irani, PTA/CLT [phone]  09/25/2015, 5:03 PM  [facility] [address] [place], [place], [address] Phone: [phone]   Fax:  [phone]  Name: [name] MRN: [id number] Date of Birth: [birth year]

## 2015-09-27 ENCOUNTER — Ambulatory Visit (HOSPITAL_COMMUNITY): Payer: BLUE CROSS/BLUE SHIELD

## 2015-10-02 ENCOUNTER — Ambulatory Visit (HOSPITAL_COMMUNITY): Payer: BLUE CROSS/BLUE SHIELD | Admitting: Physical Therapy

## 2015-10-02 DIAGNOSIS — M79672 Pain in left foot: Secondary | ICD-10-CM

## 2015-10-02 DIAGNOSIS — Z9889 Other specified postprocedural states: Secondary | ICD-10-CM

## 2015-10-02 DIAGNOSIS — R29898 Other symptoms and signs involving the musculoskeletal system: Secondary | ICD-10-CM

## 2015-10-02 DIAGNOSIS — M25662 Stiffness of left knee, not elsewhere classified: Secondary | ICD-10-CM

## 2015-10-02 DIAGNOSIS — M6281 Muscle weakness (generalized): Secondary | ICD-10-CM

## 2015-10-02 DIAGNOSIS — M25672 Stiffness of left ankle, not elsewhere classified: Secondary | ICD-10-CM

## 2015-10-02 DIAGNOSIS — R262 Difficulty in walking, not elsewhere classified: Secondary | ICD-10-CM

## 2015-10-02 DIAGNOSIS — M722 Plantar fascial fibromatosis: Secondary | ICD-10-CM | POA: Diagnosis not present

## 2015-10-02 NOTE — Therapy (Signed)
[place], [place], [address] Phone: [phone]   Fax:  [phone]  Physical Therapy Treatment  Patient Details  Name: [name] MRN: [id number] Date of Birth: 07/02/1966 Referring Provider: Dr. Caprice Beaver   Encounter Date: 10/02/2015      PT End of Session - 10/02/15 1701    Visit Number 10   Number of Visits 12   Date for PT Re-Evaluation 10/18/15   Authorization Type BCBS   Authorization Time Period 08/21/15 to 10/21/15   PT Start Time 1600   PT Stop Time 1642   PT Time Calculation (min) 42 min   Activity Tolerance Patient tolerated treatment well   Behavior During Therapy [facility] for tasks assessed/performed      Past Medical History  Diagnosis Date  . COPD (chronic obstructive pulmonary disease) ([place])   . Hypertension   . Thyroid disease   . Sleep apnea     pt DX but did not want to use CPAP.  Marland Kitchen Hypothyroidism   . Arthritis     Past Surgical History  Procedure Laterality Date  . Nasal sinus surgery    . Knee arthroscopy with medial menisectomy Left 05/04/2015    Procedure: KNEE ARTHROSCOPY WITH MEDIAL AND LATERAL MENISECTOMY;  Surgeon: Carole Civil, MD;  Location: [facility];  Service: Orthopedics;  Laterality: Left;    There were no vitals filed for this visit.  Visit Diagnosis:  Plantar fasciitis of left foot  Left foot pain  Difficulty walking  Muscle weakness  S/P left knee arthroscopy  Stiffness of left knee  Ankle stiffness, left  Weakness of left leg      Subjective Assessment - 10/02/15 1706    Subjective Pt states she has not had any pain in her foot for over a week now.     Currently in Pain? No/denies                         [facility] PT Treatment/Exercise - 10/02/15 1600    Knee/Hip Exercises: Stretches   Active Hamstring Stretch 30 seconds;3 reps   Active Hamstring Stretch Limitations 12 inch box    Gastroc Stretch 3 reps;30 seconds   Gastroc Stretch Limitations slantboard   Manual Therapy   Manual Therapy Myofascial release;Soft tissue mobilization   Joint Mobilization L foot supination/pronation, calcaneal mobs, talar dorsiflexion mobs all grade 3    Soft tissue mobilization soft tissue to L gastroc/soleus    Ankle Exercises: Standing   BAPS Standing;Level 3;15 reps   Heel Walk (Round Trip) 3RT    Ankle Exercises: Stretches   Plantar Fascia Stretch 3 reps;30 seconds   Slant Board Stretch 3 reps;30 seconds                  PT Short Term Goals - 09/20/15 1702    PT SHORT TERM GOAL #1   Title [name] in order to enhance job performance and overall mobilty    Baseline 12/29-3/10 with this activity    Time 3   Period Weeks   Status Achieved   PT SHORT TERM GOAL #2   Title Patient to demonstrate L and R ankle dorsiflexion range of motion of at least 15 degrees bilaterally in order to improve gait, reduce joint stiffness, and improve mobiltiy  Time 3   Period Weeks   Status On-going   PT SHORT TERM GOAL #3   Title L ankle inversion and eversion range of motion to match that of R ankle in order to improve mechanics and reduce mechanical stiffness    Time 3   Period Weeks   Status Partially Met   PT SHORT TERM GOAL #4   Title Patient to be independent in correctly and consistently performing appropriate HEP, to be updated PRN    Baseline 12/29- doing daily    Time 3   Period Weeks   Status Achieved           PT Long Term Goals - 09/20/15 1704    PT LONG TERM GOAL #1   Title Patient to demonstrate 5/5 strength in all ankle motions in order to demonstrate enhanced stability and reduce overall pain    Time 6   Period Weeks   Status Achieved   PT LONG TERM GOAL #2   Title Patient to experience no more than 3/10 pain L foot/ankle after transitioning from sit to stand and when  performing an entire shift at work in order to [employer] functional task performance and interaction with environment    Baseline 12/29- 3/10 with transitions however 6/10 after full shift at work    Time 6   Period Weeks   Status Partially [place] #3   Title Patient to be independent with plantar fasciitis management methods including but not limited to ice massage, plantar fasciia stretch, soft tissue mobilization, in order to enhance her independence in dealing with this chronic condition    Baseline 12/29- patient reports she is fairly comfortable with self-management techniques    Time 6   Period Weeks   Status Achieved   PT LONG TERM GOAL #4   Title Patient to be able to tolerate at least 20 minutes of light to moderate activity at least 3 times per week with no increase in L foot/ankle pain in order to maintain functional gains and assist in improve overall health status    Baseline 12/29- walking 15-20 minutes on treadmill each day at work on breaks plus walking for work during her shift    Time 6   Period Weeks   Status Achieved               Plan - 10/02/15 1702    Clinical Impression Statement Pt able to complete all therex in good form and control.  continues to have difficulty maintaining ballance greater than 20 seconds on Lt LE.  Pt encouraged to continue to practice this at home.  Manual completed again today with overal imrprovements noted in mobiltiy and no pain voiced before or after session.  Discussed pending re-evaluation in 2 more sessions, however patient request to go ahead with re-eval/discharge next session.     Pt will benefit from skilled therapeutic intervention in order to improve on the following deficits Abnormal gait;Decreased activity tolerance;Decreased mobility;Decreased range of motion;Decreased strength;Difficulty walking;Pain   PT Frequency --  4 more sessions    PT Duration --  4 sessions    PT Treatment/Interventions ADLs/Self  Care Home Management;Cryotherapy;Ultrasound;Gait training;Stair training;Functional mobility training;Therapeutic activities;Therapeutic exercise;Balance training;Neuromuscular re-education;Patient/family education;Orthotic Fit/Training;Manual techniques;Taping   PT Next Visit Plan Re-evaluate with anticipated discharge next session.    PT Home Exercise Plan given    Consulted and Agree with Plan of Care Patient        Problem List Patient  Active Problem List   Diagnosis Date Noted  . Medial meniscus tear   . Lateral meniscus tear, current   . Erosive lichen planus of vulva 08/28/2014  . 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 Right hip flexor tightness 12/06/2013  . Plantar fasciitis 12/06/2013    Teena Irani, PTA/CLT [phone] 10/02/2015, 5:06 PM  [place] [address] [place], [place], [address] Phone: [phone]   Fax:  [phone]  Name: [name] MRN: [id number] Date of Birth: 1966/03/02

## 2015-10-04 ENCOUNTER — Ambulatory Visit (HOSPITAL_COMMUNITY): Payer: BLUE CROSS/BLUE SHIELD | Admitting: Physical Therapy

## 2015-10-17 ENCOUNTER — Telehealth: Payer: Self-pay

## 2015-10-17 NOTE — Telephone Encounter (Signed)
Pt received triage letter from DS. Please call [phone]

## 2015-10-18 NOTE — Telephone Encounter (Signed)
LMOM to call.

## 2015-10-25 ENCOUNTER — Telehealth: Payer: Self-pay

## 2015-10-30 ENCOUNTER — Ambulatory Visit (INDEPENDENT_AMBULATORY_CARE_PROVIDER_SITE_OTHER): Payer: BLUE CROSS/BLUE SHIELD | Admitting: Adult Health

## 2015-10-30 ENCOUNTER — Other Ambulatory Visit (HOSPITAL_COMMUNITY)
Admission: RE | Admit: 2015-10-30 | Discharge: 2015-10-30 | Disposition: A | Discharge status: Home / Self Care | Payer: BLUE CROSS/BLUE SHIELD | Source: Ambulatory Visit | Attending: Adult Health | Admitting: Adult Health

## 2015-10-30 ENCOUNTER — Other Ambulatory Visit: Payer: Self-pay

## 2015-10-30 ENCOUNTER — Encounter: Payer: Self-pay | Admitting: Adult Health

## 2015-10-30 VITALS — BP 120/78 | HR 72 | Ht 63.0 in | Wt 223.0 lb

## 2015-10-30 DIAGNOSIS — M549 Dorsalgia, unspecified: Secondary | ICD-10-CM | POA: Insufficient documentation

## 2015-10-30 DIAGNOSIS — K649 Unspecified hemorrhoids: Secondary | ICD-10-CM | POA: Diagnosis not present

## 2015-10-30 DIAGNOSIS — Z780 Asymptomatic menopausal state: Secondary | ICD-10-CM

## 2015-10-30 DIAGNOSIS — Z1151 Encounter for screening for human papillomavirus (HPV): Secondary | ICD-10-CM | POA: Insufficient documentation

## 2015-10-30 DIAGNOSIS — N62 Hypertrophy of breast: Secondary | ICD-10-CM | POA: Diagnosis not present

## 2015-10-30 DIAGNOSIS — M25512 Pain in left shoulder: Secondary | ICD-10-CM

## 2015-10-30 DIAGNOSIS — Z01411 Encounter for gynecological examination (general) (routine) with abnormal findings: Secondary | ICD-10-CM

## 2015-10-30 DIAGNOSIS — R195 Other fecal abnormalities: Secondary | ICD-10-CM

## 2015-10-30 DIAGNOSIS — M545 Low back pain, unspecified: Secondary | ICD-10-CM

## 2015-10-30 DIAGNOSIS — Z1211 Encounter for screening for malignant neoplasm of colon: Secondary | ICD-10-CM

## 2015-10-30 DIAGNOSIS — Z01419 Encounter for gynecological examination (general) (routine) without abnormal findings: Secondary | ICD-10-CM

## 2015-10-30 DIAGNOSIS — M25511 Pain in right shoulder: Secondary | ICD-10-CM

## 2015-10-30 HISTORY — DX: Unspecified hemorrhoids: K64.9

## 2015-10-30 HISTORY — DX: Other fecal abnormalities: R19.5

## 2015-10-30 HISTORY — DX: Pain in right shoulder: M25.511

## 2015-10-30 HISTORY — DX: Hypertrophy of breast: N62

## 2015-10-30 HISTORY — DX: Asymptomatic menopausal state: Z78.0

## 2015-10-30 HISTORY — DX: Dorsalgia, unspecified: M54.9

## 2015-10-30 LAB — HEMOCCULT GUIAC POC 1CARD (OFFICE): FECAL OCCULT BLD: POSITIVE — AB

## 2015-10-30 MED ORDER — HYDROCORTISONE ACE-PRAMOXINE 2.5-1 % RE CREA: 1.0000 "application " | TOPICAL_CREAM | Freq: Three times a day (TID) | RECTAL | Status: DC

## 2015-10-30 NOTE — Patient Instructions (Signed)
Labs with PCP Mammogram yearly Physical in 1 year, pap in 3 years if normal Colonoscopy 2/20  Do 3 hemoccult cards  Try analpram [facility]

## 2015-10-30 NOTE — Telephone Encounter (Signed)
SUPREP SPLIT DOSING- CLEAR LIQUIDS WITH BREAKFAST.  

## 2015-10-30 NOTE — Progress Notes (Signed)
Patient ID: [name], female   DOB: [birth year], 50 y.o.   MRN: [id number] History of Present Illness: [name] is a 50 year old white female, single, G0P0, in for well woman gyn exam and pap.She is having colonoscopy 2/20 with Dr Darrick Penna.She is complaining of large breasts and shoulder and back pain.She is wanting breast reduction. PCP is Dwyane Luo at [facility].   Current Medications, Allergies, Past Medical History, Past Surgical History, Family History and Social History were reviewed in [employer] record.     Review of Systems: Patient denies any headaches, hearing loss, fatigue, blurred vision, shortness of breath, chest pain, abdominal pain, problems with bowel movements, urination, or intercourse. No mood swings.Has pain left knee, needs replacement.    Physical Exam:BP 120/78 mmHg  Pulse 72  Ht  (1.6 m)  Wt 223 lb (101.152 kg)  BMI 39.51 kg/m2  LMP 04/14/2011  General:  Well developed, well nourished, no acute distress Skin:  Warm and dry Neck:  Midline trachea, normal thyroid, good ROM, no lymphadenopathy Lungs; Clear to auscultation bilaterally Breast:  No dominant palpable mass, retraction, or nipple discharge, large breasts, wears 44 DD, has mild indentations top of shooulders Cardiovascular: Regular rate and rhythm Abdomen:  Soft, non tender, no hepatosplenomegaly Pelvic:  External genitalia is normal in appearance, no lesions.  The vagina is normal in appearance. Urethra has no lesions or masses. The cervix is smooth, pap with HPV performed.  Uterus is felt to be normal size, shape, and contour.  No adnexal masses or tenderness noted.Bladder is non tender, no masses felt. Rectal: Good sphincter tone, no polyps, + hemorrhoids felt.  Hemoccult positive. Extremities/musculoskeletal:  No swelling or varicosities noted, no clubbing or cyanosis, has area left arm had skin biopsy was negative for cancer Psych:  No mood changes, alert and cooperative,seems  happy Discussed, hemorrhoid banding as option, will give analpram and will check hemoccult cards at home.Will give number to plastic surgeon.  Impression: Well woman gyn exam and pap Hemorrhoids Positive hemoccult card PM Large breasts Bilateral shoulder pain  Low back pain     Plan: Will send 3 hemoccult cards home to do Rx analpram HC use tid prn Mammogram yearly Physical in 1 year, pap in 3 if normal Colonoscopy 2/20 Labs with PCP Number given for plastic surgeon for breast reduction consult. Review handout on hemorrhoid banding

## 2015-10-30 NOTE — Telephone Encounter (Signed)
Gastroenterology Pre-Procedure Review  Request Date: 10/25/2015 Requesting Physician: Dwyane Luo, PA   PATIENT REVIEW QUESTIONS: The patient responded to the following health history questions as indicated:    1. Diabetes Melitis: no 2. Joint replacements in the past 12 months: no 3. Major health problems in the past 3 months: no 4. Has an artificial valve or MVP: no 5. Has a defibrillator: no 6. Has been advised in past to take antibiotics in advance of a procedure like teeth cleaning: no 7. Family history of colon cancer: no  8. Alcohol Use: RARELY 9. History of sleep apnea : PREVIOUSLY DIAGNOSED BUT NEVER GOT C-PAP/ TOO EXPENSIVE    MEDICATIONS & ALLERGIES:    Patient reports the following regarding taking any blood thinners:   Plavix? no Aspirin? Yes Coumadin? no  Patient confirms/reports the following medications:  Current Outpatient Prescriptions  Medication Sig Dispense Refill  . aspirin 81 MG tablet Take 81 mg by mouth daily.    . budesonide-formoterol (SYMBICORT) 160-4.5 MCG/ACT inhaler Inhale 2 puffs into the lungs daily.    . diclofenac (VOLTAREN) 75 MG EC tablet Take 75 mg by mouth 2 (two) times daily.    . hydrochlorothiazide (HYDRODIURIL) 25 MG tablet Take 25 mg by mouth daily.    Marland Kitchen levothyroxine (SYNTHROID, LEVOTHROID) 25 MCG tablet Take 25 mcg by mouth daily before breakfast.    . NON FORMULARY POTASSIUM OTC 550 MG   DAILY    . pravastatin (PRAVACHOL) 20 MG tablet Take 20 mg by mouth daily.    Marland Kitchen gabapentin (NEURONTIN) 300 MG capsule Take 300 mg by mouth 3 (three) times daily. Reported on 10/25/2015    . HYDROcodone-acetaminophen (NORCO) 7.5-325 MG per tablet Take 1 tablet by mouth every 4 (four) hours as needed for moderate pain. (Patient not taking: Reported on 10/25/2015) 74 tablet 0  . HYDROcodone-acetaminophen (NORCO/VICODIN) 5-325 MG tablet Take 1 tablet by mouth every 4 (four) hours as needed for moderate pain. (Patient not taking: Reported on 10/25/2015) 42 tablet 0    No current facility-administered medications for this visit.    Patient confirms/reports the following allergies:  Allergies  Allergen Reactions  . Penicillins Other (See Comments)    Yeast infection    No orders of the defined types were placed in this encounter.    AUTHORIZATION INFORMATION Primary Insurance:  ID #:   Group #:  Pre-Cert / Auth required:  Pre-Cert / Auth #:   Secondary Insurance:   ID #:   Group #:  Pre-Cert / Auth required:  Pre-Cert / Auth #:   SCHEDULE INFORMATION: Procedure has been scheduled as follows:  Date:    11/12/2015    Time:  8:30 am Location: [facility] Short Stay  This Gastroenterology Pre-Precedure Review Form is being routed to the following provider(s): Jonette Eva, MD

## 2015-10-30 NOTE — Telephone Encounter (Signed)
See separate triage.  

## 2015-10-31 MED ORDER — PEG 3350-KCL-NA BICARB-NACL 420 G PO SOLR: 4000.0000 mL | ORAL | Status: DC

## 2015-10-31 NOTE — Telephone Encounter (Signed)
Rx sent to the pharmacy and instructions mailed to pt.  

## 2015-11-02 LAB — CYTOLOGY - PAP

## 2015-11-07 ENCOUNTER — Telehealth: Payer: Self-pay

## 2015-11-07 NOTE — Telephone Encounter (Signed)
I called [employer] @ [phone] and spoke to Zack P who said a PA is not required for a screening colonoscopy. Reference # [id number].

## 2015-11-12 ENCOUNTER — Other Ambulatory Visit: Payer: Self-pay | Admitting: Adult Health

## 2015-11-12 ENCOUNTER — Encounter (HOSPITAL_COMMUNITY)
Admission: RE | Disposition: A | Discharge status: Home / Self Care | Payer: Self-pay | Source: Ambulatory Visit | Attending: Gastroenterology

## 2015-11-12 ENCOUNTER — Encounter (HOSPITAL_COMMUNITY): Payer: Self-pay | Admitting: *Deleted

## 2015-11-12 ENCOUNTER — Ambulatory Visit (HOSPITAL_COMMUNITY)
Admission: RE | Admit: 2015-11-12 | Discharge: 2015-11-12 | Disposition: A | Discharge status: Home / Self Care | Payer: BLUE CROSS/BLUE SHIELD | Source: Ambulatory Visit | Attending: Gastroenterology | Admitting: Gastroenterology

## 2015-11-12 DIAGNOSIS — K648 Other hemorrhoids: Secondary | ICD-10-CM | POA: Insufficient documentation

## 2015-11-12 DIAGNOSIS — E039 Hypothyroidism, unspecified: Secondary | ICD-10-CM | POA: Diagnosis not present

## 2015-11-12 DIAGNOSIS — Z79899 Other long term (current) drug therapy: Secondary | ICD-10-CM | POA: Diagnosis not present

## 2015-11-12 DIAGNOSIS — M1990 Unspecified osteoarthritis, unspecified site: Secondary | ICD-10-CM | POA: Insufficient documentation

## 2015-11-12 DIAGNOSIS — Z808 Family history of malignant neoplasm of other organs or systems: Secondary | ICD-10-CM | POA: Insufficient documentation

## 2015-11-12 DIAGNOSIS — I10 Essential (primary) hypertension: Secondary | ICD-10-CM | POA: Diagnosis not present

## 2015-11-12 DIAGNOSIS — Z7951 Long term (current) use of inhaled steroids: Secondary | ICD-10-CM | POA: Diagnosis not present

## 2015-11-12 DIAGNOSIS — J449 Chronic obstructive pulmonary disease, unspecified: Secondary | ICD-10-CM | POA: Insufficient documentation

## 2015-11-12 DIAGNOSIS — Q438 Other specified congenital malformations of intestine: Secondary | ICD-10-CM | POA: Diagnosis not present

## 2015-11-12 DIAGNOSIS — Z7982 Long term (current) use of aspirin: Secondary | ICD-10-CM | POA: Insufficient documentation

## 2015-11-12 DIAGNOSIS — K5730 Diverticulosis of large intestine without perforation or abscess without bleeding: Secondary | ICD-10-CM | POA: Insufficient documentation

## 2015-11-12 DIAGNOSIS — G4730 Sleep apnea, unspecified: Secondary | ICD-10-CM | POA: Diagnosis not present

## 2015-11-12 DIAGNOSIS — Z1211 Encounter for screening for malignant neoplasm of colon: Secondary | ICD-10-CM | POA: Diagnosis not present

## 2015-11-12 DIAGNOSIS — E079 Disorder of thyroid, unspecified: Secondary | ICD-10-CM | POA: Diagnosis not present

## 2015-11-12 HISTORY — PX: COLONOSCOPY: SHX5424

## 2015-11-12 SURGERY — COLONOSCOPY
Anesthesia: Moderate Sedation

## 2015-11-12 MED ORDER — MIDAZOLAM HCL 5 MG/5ML IJ SOLN
INTRAMUSCULAR | Status: AC
  Filled 2015-11-12: qty 10

## 2015-11-12 MED ORDER — MEPERIDINE HCL 100 MG/ML IJ SOLN
INTRAMUSCULAR | Status: DC | PRN
  Administered 2015-11-12 (×2): 50 mg

## 2015-11-12 MED ORDER — SODIUM CHLORIDE 0.9 % IV SOLN
INTRAVENOUS | Status: DC
  Administered 2015-11-12: 08:00:00 via INTRAVENOUS

## 2015-11-12 MED ORDER — MEPERIDINE HCL 100 MG/ML IJ SOLN
INTRAMUSCULAR | Status: AC
  Filled 2015-11-12: qty 2

## 2015-11-12 MED ORDER — MIDAZOLAM HCL 5 MG/5ML IJ SOLN
INTRAMUSCULAR | Status: DC | PRN
  Administered 2015-11-12: 1 mg via INTRAVENOUS
  Administered 2015-11-12 (×2): 2 mg via INTRAVENOUS

## 2015-11-12 NOTE — Op Note (Signed)
[facility] [address] [place], [address]   COLONOSCOPY PROCEDURE REPORT  PATIENT: [name], [name]  MR#: [id number] BIRTHDATE: 1966/04/12 , 49  yrs. old GENDER: female ENDOSCOPIST: [place], MD REFERRED IO:[id number] Jean Rosenthal, P.A. PROCEDURE DATE:  11-16-15 PROCEDURE:   Colonoscopy, screening INDICATIONS:average risk patient for colon cancer. MEDICATIONS: Demerol 75 mg IV and Versed 5 mg IV MD INITIATED SEDATION:  0840 PROCEDURE COMPLETE: 0907  DESCRIPTION OF PROCEDURE:    Physical exam was performed.  Informed consent was obtained from the patient after explaining the benefits, risks, and alternatives to procedure.  The patient was connected to monitor and placed in left lateral position. Continuous oxygen was provided by nasal cannula and IV medicine administered through an indwelling cannula.  After administration of sedation and rectal exam, the patients rectum was intubated and the EC-[id number] ([id number])  colonoscope was advanced under direct visualization to the cecum.  The scope was removed slowly by carefully examining the color, texture, anatomy, and integrity mucosa on the way out.  The patient was recovered in endoscopy and discharged home in satisfactory condition. Estimated blood loss is zero unless otherwise noted in this procedure report.    COLON FINDINGS: There was moderate diverticulosis noted in the sigmoid colon and descending colon with associated luminal narrowing AT THE RECTOSIGMOID JUNCTION, muscular hypertrophy and angulation.  , The colon was redundant.  , and Small internal hemorrhoids were found.  PREP QUALITY: good.  CECAL W/D TIME: 8       minutes COMPLICATIONS: None  ENDOSCOPIC IMPRESSION: 1.   There was moderate diverticulosis noted in the sigmoid colon and descending colon 2.   The LEFT colon IS SLIGHTLY redundant 3.   Small internal hemorrhoids  RECOMMENDATIONS: DRINK WATER TO KEEP URINE LIGHT YELLOW. CONTINUE YOUR  WEIGHT LOSS EFFORTS. Follow a HIGH FIBER/LOW FAT DIET USE PREPARATION H qid prn IF YOU HAVE RECTAL BLEEDING. Next colonoscopy in 10 years.     _______________________________ eSignedWest Bali, MD November 16, 2015 9:35 AM   CPT CODES: ICD CODES:  The ICD and CPT codes recommended by this software are interpretations from the data that the clinical staff has captured with the software.  The verification of the translation of this report to the ICD and CPT codes and modifiers is the sole responsibility of the health care institution and practicing physician where this report was generated.  [employer] will not be held responsible for the validity of the ICD and CPT codes included on this report.  AMA assumes no liability for data contained or not contained herein. CPT is a Publishing rights manager of the [employer].

## 2015-11-12 NOTE — Discharge Instructions (Signed)
YOU DID NOT HAVE ANY POLYPS. You have SMALL internal hemorrhoids, which MAY CAUSE RECTAL BLEEDING.  YOU HAVE diverticulosis IN YOUR LEFT COLON.    DRINK WATER TO KEEP YOUR URINE LIGHT YELLOW.  CONTINUE YOUR WEIGHT LOSS EFFORTS. YOUR BODY MASS INDEX IS near 40. OBESITY IS ASSOCIATED WITH AN INCREASE RISK OF ALL CANCERS, ESPECIALLY COLON CANCER.  Follow a HIGH FIBER/LOW FAT DIET. AVOID ITEMS THAT CAUSE BLOATING. MEATS SHOULD BE BAKED, BROILED, OR BOILED. AVOID FRIED FOOD AND ITEMS THAT CAUSE BLOATING & GAS. See info below.  USE PREPARATION H FOUR TIMES A DAY FOR 7 DAYS IF YOU HAVE RECTAL BLEEDING.   Next colonoscopy in 10 years.  Colonoscopy Care After Read the instructions outlined below and refer to this sheet in the next week. These discharge instructions provide you with general information on caring for yourself after you leave the hospital. While your treatment has been planned according to the most current medical practices available, unavoidable complications occasionally occur. If you have any problems or questions after discharge, call DR. FIELDS, [phone].  ACTIVITY  You may resume your regular activity, but move at a slower pace for the next 24 hours.   Take frequent rest periods for the next 24 hours.   Walking will help get rid of the air and reduce the bloated feeling in your belly (abdomen).   No driving for 24 hours (because of the medicine (anesthesia) used during the test).   You may shower.   Do not sign any important legal documents or operate any machinery for 24 hours (because of the anesthesia used during the test).    NUTRITION  Drink plenty of fluids.   You may resume your normal diet as instructed by your doctor.   Begin with a light meal and progress to your normal diet. Heavy or fried foods are harder to digest and may make you feel sick to your stomach (nauseated).   Avoid alcoholic beverages for 24 hours or as instructed.     MEDICATIONS  You may resume your normal medications.   WHAT YOU CAN EXPECT TODAY  Some feelings of bloating in the abdomen.   Passage of more gas than usual.   Spotting of blood in your stool or on the toilet paper  .  IF YOU HAD POLYPS REMOVED DURING THE COLONOSCOPY:  Eat a soft diet IF YOU HAVE NAUSEA, BLOATING, ABDOMINAL PAIN, OR VOMITING.    FINDING OUT THE RESULTS OF YOUR TEST Not all test results are available during your visit. DR. Darrick Penna WILL CALL YOU WITHIN 7 DAYS OF YOUR PROCEDUE WITH YOUR RESULTS. Do not assume everything is normal if you have not heard from DR. FIELDS IN ONE WEEK, CALL HER OFFICE AT [phone].  SEEK IMMEDIATE MEDICAL ATTENTION AND CALL THE OFFICE: [phone] IF:  You have more than a spotting of blood in your stool.   Your belly is swollen (abdominal distention).   You are nauseated or vomiting.   You have a temperature over 101F.   You have abdominal pain or discomfort that is severe or gets worse throughout the day.  High-Fiber Diet A high-fiber diet changes your normal diet to include more whole grains, legumes, fruits, and vegetables. Changes in the diet involve replacing refined carbohydrates with unrefined foods. The calorie level of the diet is essentially unchanged. The Dietary Reference Intake (recommended amount) for adult males is 38 grams per day. For adult females, it is 25 grams per day. Pregnant and lactating women should consume  28 grams of fiber per day. Fiber is the intact part of a plant that is not broken down during digestion. Functional fiber is fiber that has been isolated from the plant to provide a beneficial effect in the body. PURPOSE  Increase stool bulk.   Ease and regulate bowel movements.   Lower cholesterol.  REDUCE RISK OF COLON CANCER  INDICATIONS THAT YOU NEED MORE FIBER  Constipation and hemorrhoids.   Uncomplicated diverticulosis (intestine condition) and irritable bowel syndrome.   Weight  management.   As a protective measure against hardening of the arteries (atherosclerosis), diabetes, and cancer.   GUIDELINES FOR INCREASING FIBER IN THE DIET  Start adding fiber to the diet slowly. A gradual increase of about 5 more grams (2 slices of whole-wheat bread, 2 servings of most fruits or vegetables, or 1 bowl of high-fiber cereal) per day is best. Too rapid an increase in fiber may result in constipation, flatulence, and bloating.   Drink enough water and fluids to keep your urine clear or pale yellow. Water, juice, or caffeine-free drinks are recommended. Not drinking enough fluid may cause constipation.   Eat a variety of high-fiber foods rather than one type of fiber.   Try to increase your intake of fiber through using high-fiber foods rather than fiber pills or supplements that contain small amounts of fiber.   The goal is to change the types of food eaten. Do not supplement your present diet with high-fiber foods, but replace foods in your present diet.   INCLUDE A VARIETY OF FIBER SOURCES  Replace refined and processed grains with whole grains, canned fruits with fresh fruits, and incorporate other fiber sources. White rice, white breads, and most bakery goods contain little or no fiber.   Brown whole-grain rice, buckwheat oats, and many fruits and vegetables are all good sources of fiber. These include: broccoli, Brussels sprouts, cabbage, cauliflower, beets, sweet potatoes, white potatoes (skin on), carrots, tomatoes, eggplant, squash, berries, fresh fruits, and dried fruits.   Cereals appear to be the richest source of fiber. Cereal fiber is found in whole grains and bran. Bran is the fiber-rich outer coat of cereal grain, which is largely removed in refining. In whole-grain cereals, the bran remains. In breakfast cereals, the largest amount of fiber is found in those with "bran" in their names. The fiber content is sometimes indicated on the label.   You may need to  include additional fruits and vegetables each day.   In baking, for 1 cup white flour, you may use the following substitutions:   1 cup whole-wheat flour minus 2 tablespoons.   1/2 cup white flour plus 1/2 cup whole-wheat flour.   Diverticulosis Diverticulosis is a common condition that develops when small pouches (diverticula) form in the wall of the colon. The risk of diverticulosis increases with age. It happens more often in people who eat a low-fiber diet. Most individuals with diverticulosis have no symptoms. Those individuals with symptoms usually experience belly (abdominal) pain, constipation, or loose stools (diarrhea).  HOME CARE INSTRUCTIONS  Increase the amount of fiber in your diet as directed by your caregiver or dietician. This may reduce symptoms of diverticulosis.   Drink at least 6 to 8 glasses of water each day to prevent constipation.   Try not to strain when you have a bowel movement.   Avoiding nuts and seeds to prevent complications is NOT NECESSARY.   FOODS HAVING HIGH FIBER CONTENT INCLUDE:  Fruits. Apple, peach, pear, tangerine, raisins, prunes.  Vegetables. Brussels sprouts, asparagus, broccoli, cabbage, carrot, cauliflower, romaine lettuce, spinach, summer squash, tomato, winter squash, zucchini.   Starchy Vegetables. Baked beans, kidney beans, lima beans, split peas, lentils, potatoes (with skin).   Grains. Whole wheat bread, brown rice, bran flake cereal, plain oatmeal, white rice, shredded wheat, bran muffins.   SEEK IMMEDIATE MEDICAL CARE IF:  You develop increasing pain or severe bloating.   You have an oral temperature above 101F.   You develop vomiting or bowel movements that are bloody or black.   Hemorrhoids Hemorrhoids are dilated (enlarged) veins around the rectum. Sometimes clots will form in the veins. This makes them swollen and painful. These are called thrombosed hemorrhoids. Causes of hemorrhoids include:  Constipation.    Straining to have a bowel movement.   HEAVY LIFTING  HOME CARE INSTRUCTIONS  Eat a well balanced diet and drink 6 to 8 glasses of water every day to avoid constipation. You may also use a bulk laxative.   Avoid straining to have bowel movements.   Keep anal area dry and clean.   Do not use a donut shaped pillow or sit on the toilet for long periods. This increases blood pooling and pain.   Move your bowels when your body has the urge; this will require less straining and will decrease pain and pressure.    PATIENT INSTRUCTIONS POST-ANESTHESIA  IMMEDIATELY FOLLOWING SURGERY:  Do not drive or operate machinery for the first twenty four hours after surgery.  Do not make any important decisions for twenty four hours after surgery or while taking narcotic pain medications or sedatives.  If you develop intractable nausea and vomiting or a severe headache please notify your doctor immediately.  FOLLOW-UP:  Please make an appointment with your surgeon as instructed. You do not need to follow up with anesthesia unless specifically instructed to do so.  WOUND CARE INSTRUCTIONS (if applicable):  Keep a dry clean dressing on the anesthesia/puncture wound site if there is drainage.  Once the wound has quit draining you may leave it open to air.  Generally you should leave the bandage intact for twenty four hours unless there is drainage.  If the epidural site drains for more than 36-48 hours please call the anesthesia department.  QUESTIONS?:  Please feel free to call your physician or the hospital operator if you have any questions, and they will be happy to assist you.

## 2015-11-12 NOTE — H&P (Signed)
Primary Care Physician:  Pershing Proud Primary Gastroenterologist:  Dr. Darrick Penna  Pre-Procedure History & Physical: HPI:  [name] is a 50 y.o. female here for COLON CANCER SCREENING.  Past Medical History  Diagnosis Date  . COPD (chronic obstructive pulmonary disease) (HCC)   . Hypertension   . Thyroid disease   . Sleep apnea     pt DX but did not want to use CPAP.  Marland Kitchen Hypothyroidism   . Arthritis   . Hemorrhoids 10/30/2015  . Fecal occult blood test positive 10/30/2015  . Postmenopausal 10/30/2015  . Large breasts 10/30/2015    43 DD  . Bilateral shoulder pain 10/30/2015  . Back pain 10/30/2015    Past Surgical History  Procedure Laterality Date  . Nasal sinus surgery    . Knee arthroscopy with medial menisectomy Left 05/04/2015    Procedure: KNEE ARTHROSCOPY WITH MEDIAL AND LATERAL MENISECTOMY;  Surgeon: Vickki Hearing, MD;  Location: [facility];  Service: Orthopedics;  Laterality: Left;    Prior to Admission medications   Medication Sig Start Date End Date Taking? Authorizing Provider  aspirin 81 MG tablet Take 81 mg by mouth daily.   Yes Historical Provider, MD  budesonide-formoterol (SYMBICORT) 160-4.5 MCG/ACT inhaler Inhale 2 puffs into the lungs daily.   Yes Historical Provider, MD  clobetasol cream (TEMOVATE) 0.05 % Apply 1 application topically 2 (two) times daily.   Yes Historical Provider, MD  diclofenac (VOLTAREN) 75 MG EC tablet Take 75 mg by mouth 2 (two) times daily.   Yes Historical Provider, MD  GARCINIA CAMBOGIA-CHROMIUM PO Take 2 capsules by mouth daily.   Yes Historical Provider, MD  hydrochlorothiazide (HYDRODIURIL) 25 MG tablet Take 25 mg by mouth daily.   Yes Historical Provider, MD  hydrocortisone-pramoxine [facility]) 2.5-1 % rectal cream Place 1 application rectally 3 (three) times daily. 10/30/15  Yes Adline Potter, NP  levothyroxine (SYNTHROID, LEVOTHROID) 25 MCG tablet Take 25 mcg by mouth daily before breakfast.   Yes Historical Provider, MD   polyethylene glycol-electrolytes (TRILYTE) 420 g solution Take 4,000 mLs by mouth as directed. 10/31/15  Yes West Bali, MD  Potassium Gluconate 550 MG TABS Take 1 tablet by mouth daily.   Yes Historical Provider, MD  pravastatin (PRAVACHOL) 20 MG tablet Take 20 mg by mouth daily.   Yes Historical Provider, MD    Allergies as of 10/30/2015 - Review Complete 10/30/2015  Allergen Reaction Noted  . Penicillins Other (See Comments) 06/11/2012    Family History  Problem Relation Age of Onset  . Cancer Mother   . Cancer Father   . Diabetes Brother   . Heart disease Brother   . Heart attack Brother   . Cancer Brother     melanoma  . Lupus Maternal Grandmother   . Heart disease Maternal Grandmother   . Heart disease Maternal Grandfather   . Hypertension Maternal Grandfather     Social History   Social History  . Marital Status: Single    Spouse Name: N/A  . Number of Children: N/A  . Years of Education: N/A   Occupational History  . Not on file.   Social History Main Topics  . Smoking status: Never Smoker   . Smokeless tobacco: Never Used  . Alcohol Use: No  . Drug Use: No  . Sexual Activity: Yes    Birth Control/ Protection: Post-menopausal   Other Topics Concern  . Not on file   Social History Narrative    Review of Systems:  See HPI, otherwise negative ROS   Physical Exam: BP 130/70 mmHg  Pulse 62  Temp(Src) 97.8 F (36.6 C) (Oral)  Resp 12  Ht  (1.6 m)  Wt 221 lb (100.245 kg)  BMI 39.16 kg/m2  SpO2 100%  LMP 04/14/2011 General:   Alert,  pleasant and cooperative in NAD Head:  Normocephalic and atraumatic. Neck:  Supple; Lungs:  Clear throughout to auscultation.    Heart:  Regular rate and rhythm. Abdomen:  Soft, nontender and nondistended. Normal bowel sounds, without guarding, and without rebound.   Neurologic:  Alert and  oriented x4;  grossly normal neurologically.  Impression/Plan:     SCREENING  Plan:  1. TCS TODAY

## 2015-11-15 ENCOUNTER — Encounter (HOSPITAL_COMMUNITY): Payer: Self-pay | Admitting: Gastroenterology

## 2016-01-18 ENCOUNTER — Encounter: Payer: Self-pay | Admitting: Orthopedic Surgery

## 2016-01-18 ENCOUNTER — Ambulatory Visit (INDEPENDENT_AMBULATORY_CARE_PROVIDER_SITE_OTHER): Payer: BLUE CROSS/BLUE SHIELD | Admitting: Orthopedic Surgery

## 2016-01-18 VITALS — BP 120/73 | Ht 63.0 in | Wt 210.0 lb

## 2016-01-18 DIAGNOSIS — M1712 Unilateral primary osteoarthritis, left knee: Secondary | ICD-10-CM

## 2016-01-18 DIAGNOSIS — M25462 Effusion, left knee: Secondary | ICD-10-CM | POA: Diagnosis not present

## 2016-01-18 DIAGNOSIS — Z9889 Other specified postprocedural states: Secondary | ICD-10-CM | POA: Diagnosis not present

## 2016-01-18 MED ORDER — HYDROCODONE-ACETAMINOPHEN 5-325 MG PO TABS: 1.0000 | ORAL_TABLET | Freq: Three times a day (TID) | ORAL | Status: DC | PRN

## 2016-01-18 MED ORDER — NABUMETONE 500 MG PO TABS: 500.0000 mg | ORAL_TABLET | Freq: Every day | ORAL | Status: DC

## 2016-01-18 NOTE — Progress Notes (Signed)
Patient ID: [name], female   DOB: 12/01/1965, 50 y.o.   MRN: [id number]  Chief Complaint  Patient presents with  . 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 [name] appears well-developed and well-nourished. No distress.  Cardiovascular: Normal rate and intact distal pulses.   Neurological: [name] is alert and oriented to person, place, and time. [name] has normal reflexes. [name] exhibits normal muscle tone. Coordination normal.  Skin: Skin is warm and dry. No rash noted. [name] is not diaphoretic. No erythema. No pallor.  Psychiatric: [name] has a normal mood and affect. [name] behavior is normal. Judgment and thought content normal.    Ortho Exam Slight limp Moderate left knee joint effusion flexion 105 collateral ligaments and cruciate ligaments stable   ASSESSMENT AND PLAN   Encounter Diagnoses  Name Primary?  . Effusion of knee joint, left Yes  . Primary osteoarthritis of left knee     I went ahead and aspirated the knee got back 15 mL of clear synovial fluid we injected 40 mg of Depo-Medrol  I'll change [name] from full tear and 75 twice a day to Relafen 500 twice a day  Follow-up 2 months  Procedure note injection and aspiration left knee joint  Verbal consent was obtained to aspirate and inject the left knee joint   Timeout was completed to confirm the site of aspiration and injection  An 18-gauge needle was used to aspirate the left knee joint  from a suprapatellar lateral approach.  The medications used were 40 mg of Depo-Medrol and 1% lidocaine 3 cc  Anesthesia was provided by ethyl chloride and the skin was prepped with alcohol.  After cleaning the skin with alcohol an 18-gauge needle was used to aspirate the right knee joint.  We obtained 15  cc of fluid  We follow this by injection of 40 mg of Depo-Medrol and 3 cc 1% lidocaine.  There were no complications. A sterile bandage was applied.

## 2016-03-04 DIAGNOSIS — M549 Dorsalgia, unspecified: Secondary | ICD-10-CM | POA: Diagnosis not present

## 2016-03-04 DIAGNOSIS — M545 Low back pain: Secondary | ICD-10-CM | POA: Diagnosis not present

## 2016-03-04 DIAGNOSIS — M170 Bilateral primary osteoarthritis of knee: Secondary | ICD-10-CM | POA: Diagnosis not present

## 2016-03-04 DIAGNOSIS — R768 Other specified abnormal immunological findings in serum: Secondary | ICD-10-CM | POA: Diagnosis not present

## 2016-03-19 ENCOUNTER — Ambulatory Visit (INDEPENDENT_AMBULATORY_CARE_PROVIDER_SITE_OTHER): Payer: BLUE CROSS/BLUE SHIELD | Admitting: Orthopedic Surgery

## 2016-03-19 ENCOUNTER — Ambulatory Visit (INDEPENDENT_AMBULATORY_CARE_PROVIDER_SITE_OTHER): Payer: BLUE CROSS/BLUE SHIELD

## 2016-03-19 VITALS — Ht 63.0 in | Wt 210.0 lb

## 2016-03-19 DIAGNOSIS — M1712 Unilateral primary osteoarthritis, left knee: Secondary | ICD-10-CM

## 2016-03-19 DIAGNOSIS — M1710 Unilateral primary osteoarthritis, unspecified knee: Secondary | ICD-10-CM

## 2016-03-19 DIAGNOSIS — M129 Arthropathy, unspecified: Secondary | ICD-10-CM | POA: Diagnosis not present

## 2016-03-19 DIAGNOSIS — M25462 Effusion, left knee: Secondary | ICD-10-CM

## 2016-03-19 MED ORDER — HYDROCODONE-ACETAMINOPHEN 5-325 MG PO TABS: 1.0000 | ORAL_TABLET | Freq: Three times a day (TID) | ORAL | Status: DC | PRN

## 2016-03-19 NOTE — Patient Instructions (Signed)
Total Knee Replacement Total knee replacement is a procedure to replace your knee joint with an artificial knee joint (prosthetic knee joint). The purpose of this surgery is to reduce pain and improve your knee function. LET [facility] CARE PROVIDER KNOW ABOUT:   Any allergies you have.  All medicines you are taking, including vitamins, herbs, eye drops, creams, and over-the-counter medicines.  Previous problems you or members of your family have had with the use of anesthetics.  Any blood disorders you have.  Previous surgeries you have had.  Medical conditions you have. RISKS AND COMPLICATIONS  Generally, total knee replacement is a safe procedure. However, problems can occur, including:  Loss of range of motion of the knee or instability of the knee.  Loosening of the prosthesis.  Infection.  Persistent pain. BEFORE THE PROCEDURE   Plan to have someone take you home after the procedure.  Do not eat or drink anything after midnight on the night before the procedure or as directed by your health care provider.  Ask your health care provider about:  Changing or stopping your regular medicines. This is especially important if you are taking diabetes medicines or blood thinners.  Taking medicines such as aspirin and ibuprofen. These medicines can thin your blood. Do not take these medicines before your procedure if your health care provider asks you not to.  Ask your health care provider about how your surgical site will be marked or identified.  You may be given antibiotic medicines to help prevent infection. PROCEDURE   To reduce your risk of infection:  Your health care team will wash or sanitize their hands.  Your skin will be washed with soap.  An IV tube will be inserted into one of your veins. You will be given one or more of the following:  A medicine that makes you drowsy (sedative).  A medicine that makes you fall asleep (general anesthetic).  A medicine  injected into your spine that numbs your body below the waist (spinal anesthetic).  A medicine to block feeling in your leg (nerve block) to help ease pain after surgery.  An incision will be made in your knee. Your surgeon will take out any damaged cartilage and bone by sawing off the damaged surfaces.  The surgeon will then put a new metal liner over the sawed-off portion of your thigh bone (femur) and a plastic liner over the sawed-off portion of one of the bones of your lower leg (tibia). This is to restore alignment and function to your knee. A plastic piece is often used to restore the surface of your knee cap. AFTER THE PROCEDURE   You will stay in a recovery area until your medicines wear off.  You may have drainage tubes to drain excess fluid from your knee. These tubes attach to a device that removes these fluids.  Once you are awake, stable, and taking fluids well, you will be taken to your hospital room.  You may be directed to take actions to help prevent blood clots. These may include:  Walking shortly after surgery, with someone assisting you. Moving around after surgery helps to improve blood flow.  Wearing compression stockings or using different types of devices.  You will receive physical therapy as prescribed by your health care provider.   This information is not intended to replace advice given to you by your health care provider. Make sure you discuss any questions you have with your health care provider.   Document Released: 12/15/2000  Document Revised: 05/30/2015 Document Reviewed: 10/19/2011 Elsevier Interactive Patient Education 2016 [employer] Total Knee Replacement, Care After Refer to this sheet in the next few weeks. These instructions provide you with information on caring for yourself after your procedure. Your health care provider also may give you specific instructions. Your treatment has been planned according to the most current medical practices,  but problems sometimes occur. Call your health care provider if you have any problems or questions after your procedure. HOME CARE INSTRUCTIONS   See a physical therapist as directed by your health care provider.  Do not take baths, swim, or use a hot tub until your health care provider approves.  Take medicines only as directed by your health care provider.  Avoid lifting or driving until you are instructed otherwise.  If you have been sent home with a continuous passive motion machine, use it as directed by your health care provider.  Rest often, but move around as much as you can tolerate. Movement helps you to heal and helps to prevent stiffness, skin sores, and blood clots.  Wear compression stockings as told by your health care provider. These stockings help to prevent blood clots and reduce swelling in your legs.  Follow instructions from your health care provider about how to take care of your incision. Make sure you:  Wash your hands with soap and water before you change your bandage (dressing). If soap and water are not available, use hand sanitizer.  Change your dressing as told by your health care provider.  Leave stitches (sutures), skin glue, or adhesive strips in place. These skin closures may need to be in place for 2 weeks or longer. If adhesive strip edges start to loosen and curl up, you may trim the loose edges. Do not remove adhesive strips completely unless your health care provider tells you to do that. SEEK MEDICAL CARE IF:  You have difficulty breathing.  You have drainage, redness, swelling, or pain at your incision site.  You have a bad smell coming from your incision site.  You have persistent bleeding from your incision site.  Your incision breaks open after sutures (stitches) or staples have been removed.  You have a fever. SEEK IMMEDIATE MEDICAL CARE IF:   You have a rash.  You have pain or swelling in your calf or thigh.  You have shortness  of breath or chest pain.  Your range of motion in your knee is decreasing rather than increasing.   This information is not intended to replace advice given to you by your health care provider. Make sure you discuss any questions you have with your health care provider.   Document Released: 03/28/2005 Document Revised: 05/30/2015 Document Reviewed: 10/28/2011 Elsevier Interactive Patient Education [employer]

## 2016-03-19 NOTE — Progress Notes (Signed)
Patient ID: [name], female   DOB: 05/22/1966, 50 y.o.   MRN: [id number]  Chief Complaint  Patient presents with  . Follow-up    2 month recheck on keft knee with xray.    HPI 50 year-old female history of ongoing pain left knee prior treatments include Relafen hydrocodone cortisone injection presents with increasing medial knee pain  Aching pain, severe, constant, worse with exercise and weightbearing   Review of Systems  Constitutional: Negative for fever, chills and weight loss.  Respiratory: Negative for shortness of breath.   Cardiovascular: Negative for chest pain.  Musculoskeletal: Positive for joint pain.  Neurological: Negative for tingling and sensory change.    Past Medical History  Diagnosis Date  . COPD (chronic obstructive pulmonary disease) (HCC)   . Hypertension   . Thyroid disease   . Sleep apnea     pt DX but did not want to use CPAP.  Marland Kitchen. Hypothyroidism   . Arthritis   . Hemorrhoids 10/30/2015  . Fecal occult blood test positive 10/30/2015  . Postmenopausal 10/30/2015  . Large breasts 10/30/2015    44 DD  . Bilateral shoulder pain 10/30/2015  . Back pain 10/30/2015    Ht 5\' 3"  (1.6 m)  Wt 210 lb (95.255 kg)  BMI 37.21 kg/m2  LMP 04/14/2011  Physical Exam Physical Exam  Constitutional: The patient appears well-developed and well-nourished. No distress.  The patient is oriented to person, place, and time.  Psychiatric: The patient has a normal mood and affect.  Cardiovascular: Intact distal pulses.   Neurological: sensation is normal  Skin: Skin is warm and dry. No rash noted. The patient is not diaphoretic. No erythema. No pallor.    Ortho Exam Left knee medial joint line tenderness without instability no effusion. Flexion ARC is 120 muscle tone and strength normal skin overlying the left knee is normal.  Right knee currently asymptomatic no effusion normal muscle tone normal skin normal restrictions in range of motion no crepitance   ASSESSMENT AND  PLAN   Plain film today shows severe arthritis varus alignment medial compartment secondary bone changes left knee  Recommend knee replacement patient would like to wait 2 months  Refill hydrocodone   Fuller CanadaStanley Harrison, MD 03/19/2016 5:03 PM

## 2016-04-28 ENCOUNTER — Telehealth: Payer: Self-pay | Admitting: Orthopedic Surgery

## 2016-04-28 NOTE — Telephone Encounter (Signed)
Hydrocodone-Acetaminophen  5/325 mg  Qty 60 Tablets ° °Take 1 tablet by mouth every 8 (eight) hours as needed for moderate pain. °

## 2016-04-28 NOTE — Telephone Encounter (Signed)
ROUTING TO DR HARRISON FOR APPROVAL 

## 2016-04-28 NOTE — Telephone Encounter (Signed)
30 tablets

## 2016-04-29 ENCOUNTER — Other Ambulatory Visit: Payer: Self-pay | Admitting: *Deleted

## 2016-04-29 DIAGNOSIS — M1712 Unilateral primary osteoarthritis, left knee: Secondary | ICD-10-CM

## 2016-04-29 DIAGNOSIS — M25462 Effusion, left knee: Secondary | ICD-10-CM

## 2016-04-29 MED ORDER — HYDROCODONE-ACETAMINOPHEN 5-325 MG PO TABS: 1.0000 | ORAL_TABLET | Freq: Three times a day (TID) | ORAL | 0 refills | Status: DC | PRN

## 2016-04-29 NOTE — Telephone Encounter (Signed)
Prescription available 

## 2016-08-05 ENCOUNTER — Encounter: Payer: Self-pay | Admitting: Orthopedic Surgery

## 2016-08-05 ENCOUNTER — Other Ambulatory Visit: Payer: Self-pay | Admitting: Internal Medicine

## 2016-08-05 ENCOUNTER — Ambulatory Visit (INDEPENDENT_AMBULATORY_CARE_PROVIDER_SITE_OTHER): Payer: BLUE CROSS/BLUE SHIELD | Admitting: Orthopedic Surgery

## 2016-08-05 DIAGNOSIS — M25462 Effusion, left knee: Secondary | ICD-10-CM

## 2016-08-05 DIAGNOSIS — Z1231 Encounter for screening mammogram for malignant neoplasm of breast: Secondary | ICD-10-CM

## 2016-08-05 DIAGNOSIS — M1712 Unilateral primary osteoarthritis, left knee: Secondary | ICD-10-CM

## 2016-08-05 DIAGNOSIS — Z01818 Encounter for other preprocedural examination: Secondary | ICD-10-CM

## 2016-08-05 MED ORDER — HYDROCODONE-ACETAMINOPHEN 5-325 MG PO TABS
1.0000 | ORAL_TABLET | Freq: Three times a day (TID) | ORAL | 0 refills | Status: DC | PRN
Start: 2016-08-05 — End: 2016-10-21

## 2016-08-05 NOTE — Progress Notes (Signed)
Patient ID: Connie DresserJoan E Butrum, female   DOB: 01/21/1966, 50 y.o.   MRN: [id number]  Chief Complaint  Patient presents with  . Follow-up    left knee pain    HPI [name] is a 50 y.o. female.  She has had long-standing pain in her left knee treated her with activity modification, injection, cortisone injected, Relafen, hydrocodone. She put the surgery office long as she can. She now has severe pain loss of functional activities weightbearing pain and comes in requesting left total knee replacement  Review of Systems Review of Systems  Respiratory: Negative.   Cardiovascular: Negative.   Musculoskeletal: Positive for arthralgias.  All other systems reviewed and are negative.    Past Medical History:  Diagnosis Date  . Arthritis   . Back pain 10/30/2015  . Bilateral shoulder pain 10/30/2015  . COPD (chronic obstructive pulmonary disease) (HCC)   . Fecal occult blood test positive 10/30/2015  . Hemorrhoids 10/30/2015  . Hypertension   . Hypothyroidism   . Large breasts 10/30/2015   44 DD  . Postmenopausal 10/30/2015  . Sleep apnea    pt DX but did not want to use CPAP.  Marland Kitchen. Thyroid disease     Past Surgical History:  Procedure Laterality Date  . COLONOSCOPY N/A 11/12/2015   Procedure: COLONOSCOPY;  Surgeon: West BaliSandi L Fields, MD;  Location: [facility];  Service: Endoscopy;  Laterality: N/A;  8:30 AM  . KNEE ARTHROSCOPY WITH MEDIAL MENISECTOMY Left 05/04/2015   Procedure: KNEE ARTHROSCOPY WITH MEDIAL AND LATERAL MENISECTOMY;  Surgeon: Vickki HearingStanley E Yuma Pacella, MD;  Location: [facility];  Service: Orthopedics;  Laterality: Left;  . NASAL SINUS SURGERY      Social History Social History  Substance Use Topics  . Smoking status: Never Smoker  . Smokeless tobacco: Never Used  . Alcohol use No    Allergies  Allergen Reactions  . Penicillins Other (See Comments)    Yeast infection Has patient had a PCN reaction causing immediate rash, facial/tongue/throat swelling, SOB or lightheadedness with  hypotension: No Has patient had a PCN reaction causing severe rash involving mucus membranes or skin necrosis: No Has patient had a PCN reaction that required hospitalization No Has patient had a PCN reaction occurring within the last 10 years: No If all of the above answers are "NO", then may proceed with Cephalosporin use.     Current Meds  Medication Sig  . clobetasol cream (TEMOVATE) 0.05 % Apply 1 application topically 2 (two) times daily.  . diclofenac (VOLTAREN) 75 MG EC tablet Take 75 mg by mouth 2 (two) times daily.  Marland Kitchen. GARCINIA CAMBOGIA-CHROMIUM PO Take 2 capsules by mouth daily.  . hydrochlorothiazide (HYDRODIURIL) 25 MG tablet Take 25 mg by mouth daily.  . hydrocortisone-pramoxine (ANALPRAM-HC) 2.5-1 % rectal cream Place 1 application rectally 3 (three) times daily.  Marland Kitchen. levothyroxine (SYNTHROID, LEVOTHROID) 25 MCG tablet Take 25 mcg by mouth daily before breakfast.  . nabumetone (RELAFEN) 500 MG tablet Take 1 tablet (500 mg total) by mouth daily.  . Potassium Gluconate 550 MG TABS Take 1 tablet by mouth daily.  . pravastatin (PRAVACHOL) 20 MG tablet Take 20 mg by mouth daily.      Physical Exam Physical Exam LMP 04/14/2011   Gen. appearance. The patient is well-developed and well-nourished, grooming and hygiene are normal. There are no gross congenital abnormalities  The patient is alert and oriented to person place and time  Mood and affect are normal  Ambulation no limp right now  Examination reveals the following: On inspection we find medial tenderness at the joint line on the left knee, small effusion   With the range of motion of  5-100 rt knee   Stability tests were normal    Strength tests revealed grade 5 motor strength  Skin we find no rash ulceration or erythema  Sensation remains intact  Impression vascular system shows no peripheral edema  Data Reviewed xrays 6-17 severe OA medial joint mild varus   Assessment    Encounter Diagnoses   Name Primary?  . Effusion of knee joint, left   . Primary osteoarthritis of left knee Yes       Plan    Left tka  This procedure has been fully reviewed with the patient and written informed consent has been obtained.   Meds ordered this encounter  Medications  . HYDROcodone-acetaminophen (NORCO/VICODIN) 5-325 MG tablet    Sig: Take 1 tablet by mouth every 8 (eight) hours as needed for moderate pain.    Dispense:  60 tablet    Refill:  0          Fuller CanadaStanley Nyxon Strupp 08/05/2016, 3:07 PM

## 2016-08-05 NOTE — Patient Instructions (Addendum)
You have decided to proceed with knee replacement surgery. You have decided not to continue with nonoperative measures such as but not limited to oral medication, weight loss, activity modification, physical therapy, bracing, or injection.  We will perform the procedure commonly known as total knee replacement. Some of the risks associated with knee replacement surgery include but are not limited to Bleeding Infection Swelling Stiffness Blood clot Pain that persists even after surgery  Infection is especially devastating complication of knee surgery although rare. If infection does occur your implant will usually have to be removed and several surgeries and antibiotics will be needed to eradicate the infection prior to performing a repeat replacement.   If you're not comfortable with these risks and would like to continue with nonoperative treatment please let Dr. Romeo AppleHarrison know prior to your surgery.   Total Knee Replacement, Care After These instructions give you information about caring for yourself after your procedure. Your doctor may also give you more specific instructions. Call your doctor if you have any problems or questions after your procedure. Follow these instructions at home: Medicines  Take over-the-counter and prescription medicines only as told by your doctor.  If you were prescribed an antibiotic medicine, take it as told by your doctor. Do not stop taking the antibiotic even if you start to feel better.  If you were prescribed a blood thinner (anticoagulant), take it as told by your doctor. If you have a splint or brace:  Wear the splint or brace as told by your doctor. Remove it only as told by your doctor.  Loosen the splint or brace if your toes tingle, get numb, or turn cold and blue.  Do not let your splint or brace get wet if it is not waterproof.  Keep the splint or brace clean. Bathing  Do not take baths, swim, or use a hot tub until your doctor  says it is okay. Ask your doctor if you can take showers. You may only be allowed to take sponge baths for bathing.  If you have a splint or brace that is not waterproof, cover it with a watertight covering when you take a bath or a shower.  Keep your bandage (dressing) dry until your doctor says it can be taken off. Incision care and drain care  Check your cut from surgery (incision) and your drain every day for signs of infection. Check for:  More redness, swelling, or pain.  More fluid or blood.  Warmth.  Pus or a bad smell.  Follow instructions from your doctor about how to take care of your cut from surgery. Make sure you:  Wash your hands with soap and water before you change your bandage. If you cannot use soap and water, use hand sanitizer.  Change your bandage as told by your doctor.  Leave stitches (sutures), skin glue, or skin tape (adhesive) strips in place. They may need to stay in place for 2 weeks or longer. If tape strips get loose and curl up, you may trim the loose edges. Do not remove tape strips completely unless your doctor says it is okay.  If you have a drain, follow instructions from your doctor about caring for it. Do not remove the drain tube or any bandages unless your doctor says it is okay. Managing pain, stiffness, and swelling  If directed, put ice on your knee.  Put ice in a plastic bag.  Place a towel between your skin and the bag.  Leave the ice  on for 20 minutes, 2-3 times per day.  If directed, apply heat to the affected area as often as told by your doctor. Use the heat source that your doctor recommends, such as a moist heat pack or a heating pad.  Place a towel between your skin and the heat source.  Leave the heat on for 20-30 minutes.  Remove the heat if your skin turns bright red. This is especially important if you are unable to feel pain, heat, or cold. You may have a greater risk of getting burned.  Move your toes often to avoid  stiffness and to lessen swelling.  Raise (elevate) your knee above the level of your heart while you are sitting or lying down.  Wear elastic knee support for as long as told by your doctor. Driving  Do not drive until your doctor says it is okay. Ask your doctor when it is safe to drive if you have a splint or brace on your knee.  Do not drive or use heavy machinery while taking prescription pain medicine.  Do not drive for 24 hours if you received a sedative. Activity  Do not lift anything that is heavier than 10 lb (4.5 kg) until your doctor says it is okay.  Do not play contact sports until your doctor says it is okay.  Avoid high-impact activities, including running, jumping rope, and jumping jacks.  Avoid sitting for a long time without moving. Get up and move around at least every few hours.  If physical therapy was prescribed, do exercises as told by your doctor.  Return to your normal activities as told by your doctor. Ask your doctor what activities are safe for you. Safety  Do not use your leg to support your body weight until your doctor says that you can. Use crutches or a walker as told by your doctor. General instructions  Do not have any dental work done for at least 3 months after your surgery. When you do have dental work done, tell your dentist about your joint replacement.  Do not use any tobacco products, such as cigarettes, chewing tobacco, or e-cigarettes. If you need help quitting, ask your doctor.  Wear special socks (compression stockings) as told by your doctor.  If you have been sent home with a knee joint motion machine (continuous passive motion machine), use it as told by your doctor.  Drink enough fluid to keep your pee (urine) clear or pale yellow.  If you have been told to lose weight, follow instructions from your doctor about how to do this safely.  Keep all follow-up visits as told by your doctor. This is important. Contact a doctor  if:  You have more redness, swelling, or pain around your cut from surgery or your drain.  You have more fluid or blood coming from your cut from surgery or your drain.  Your cut from surgery or your drain area feels warm to the touch.  You have pus or a bad smell coming from your cut from surgery or your drain.  You have a fever.  Your cut breaks open after your doctor removes your stitches, skin glue, or skin tape strips.  Your new joint feels loose.  You have knee pain that does not go away. Get help right away if:  You have a rash.  You have pain in your calf or thigh.  You have swelling in your calf or thigh.  You have shortness of breath.  You have trouble breathing.  You have chest pain.  Your ability to move your knee is getting worse. This information is not intended to replace advice given to you by your health care provider. Make sure you discuss any questions you have with your health care provider. Document Released: 12/01/2011 Document Revised: 05/12/2016 Document Reviewed: 08/15/2015 Elsevier Interactive Patient Education  2017 [employer]  Total Knee Replacement Total knee replacement is a procedure to replace the knee joint with an artificial (prosthetic) knee joint. The purpose of this surgery is to reduce knee pain and improve knee function. The prosthetic knee joint (prosthesis) is usually made of metal and plastic. It replaces parts of the thigh bone (femur), lower leg bone (tibia), and kneecap (patella) that are removed during the procedure. Tell a health care provider about:  Any allergies you have.  All medicines you are taking, including vitamins, herbs, eye drops, creams, and over-the-counter medicines.  Any problems you or family members have had with anesthetic medicines.  Any blood disorders you have.  Any surgeries you have had.  Any medical conditions you have.  Whether you are pregnant or may be pregnant. What are the  risks? Generally, this is a safe procedure. However, problems may occur, including:  Infection.  Bleeding.  Allergic reactions to medicines.  Damage to other structures or organs.  Decreased range of motion of the knee.  Instability of the knee.  Loosening of the prosthetic joint.  Knee pain that does not go away (chronic pain). What happens before the procedure?  Ask your health care provider about:  Changing or stopping your regular medicines. This is especially important if you are taking diabetes medicines or blood thinners.  Taking medicines such as aspirin and ibuprofen. These medicines can thin your blood. Do not take these medicines before your procedure if your health care provider instructs you not to.  Have dental care and routine cleanings completed before your procedure. Plan to not have dental work done for 3 months after your procedure. Germs from anywhere in your body, including your mouth, can travel to your new joint and infect it.  Follow instructions from your health care provider about eating or drinking restrictions.  Ask your health care provider how your surgical site will be marked or identified.  You may be given antibiotic medicine to help prevent infection.  If your health care provider prescribes physical therapy, do exercises as instructed.  Do not use any tobacco products, such as cigarettes, chewing tobacco, or e-cigarettes. If you need help quitting, ask your health care provider.  You may have a physical exam.  You may have tests, such as:  X-rays.  MRI.  CT scan.  Bone scans.  You may have a blood or urine sample taken.  Plan to have someone take you home after the procedure.  If you will be going home right after the procedure, plan to have someone with you for at least 24 hours. It is recommended that you have someone to help care for you for at least 4-6 weeks after your procedure. What happens during the procedure?  To  reduce your risk of infection:  Your health care team will wash or sanitize their hands.  Your skin will be washed with soap.  An IV tube will be inserted into one of your veins.  You will be given one or more of the following:  A medicine to help you relax (sedative).  A medicine to numb the area (local anesthetic).  A medicine to make you  fall asleep (general anesthetic).  A medicine that is injected into your spine to numb the area below and slightly above the injection site (spinal anesthetic).  A medicine that is injected into an area of your body to numb everything below the injection site (regional anesthetic).  An incision will be made in your knee.  Damaged cartilage and bone will be removed from your femur, tibia, and patella.  Parts of the prosthesis (liners)will be placed over the areas of bone and cartilage that were removed. A metal liner will be placed over your femur, and plastic liners will be placed over your tibia and the underside of your patella.  One or more small tubes (drains) may be placed near your incision to help drain extra fluid from your surgical site.  Your incision will be closed with stitches (sutures), skin glue, or adhesive strips. Medicine may be applied to your incision.  A bandage (dressing) will be placed over your incision. The procedure may vary among health care providers and hospitals. What happens after the procedure?  Your blood pressure, heart rate, breathing rate, and blood oxygen level will be monitored often until the medicines you were given have worn off.  You may continue to receive fluids and medicines through an IV tube.  You will have some pain. Pain medicines will be available to help you.  You may have fluid coming from one or more drains in your incision.  You may have to wear compression stockings. These stockings help to prevent blood clots and reduce swelling in your legs.  You will be encouraged to move around  as much as possible.  You may be given a continuous passive motion machine to use at home. You will be shown how to use this machine.  Do not drive for 24 hours if you received a sedative. This information is not intended to replace advice given to you by your health care provider. Make sure you discuss any questions you have with your health care provider. Document Released: 12/15/2000 Document Revised: 05/12/2016 Document Reviewed: 08/15/2015 Elsevier Interactive Patient Education  2017 [employer].

## 2016-08-06 NOTE — Addendum Note (Signed)
Addended by: Adella HareBOOTHE, Greidy Sherard B on: 08/06/2016 04:11 PM   Modules accepted: Orders, SmartSet

## 2016-08-06 NOTE — Addendum Note (Signed)
Addended by: Adella HareBOOTHE, JAIME B on: 08/06/2016 03:49 PM   Modules accepted: Kipp BroodSmartSet

## 2016-08-06 NOTE — Addendum Note (Signed)
Addended by: Adella HareBOOTHE, Dalina Samara B on: 08/06/2016 03:22 PM   Modules accepted: Orders, SmartSet

## 2016-08-25 DIAGNOSIS — Z6838 Body mass index (BMI) 38.0-38.9, adult: Secondary | ICD-10-CM | POA: Diagnosis not present

## 2016-08-25 DIAGNOSIS — J069 Acute upper respiratory infection, unspecified: Secondary | ICD-10-CM | POA: Diagnosis not present

## 2016-08-25 DIAGNOSIS — Z1389 Encounter for screening for other disorder: Secondary | ICD-10-CM | POA: Diagnosis not present

## 2016-08-25 DIAGNOSIS — J209 Acute bronchitis, unspecified: Secondary | ICD-10-CM | POA: Diagnosis not present

## 2016-08-28 ENCOUNTER — Ambulatory Visit
Admission: RE | Admit: 2016-08-28 | Discharge: 2016-08-28 | Disposition: A | Discharge status: Home / Self Care | Payer: BLUE CROSS/BLUE SHIELD | Source: Ambulatory Visit | Attending: Internal Medicine | Admitting: Internal Medicine

## 2016-08-28 DIAGNOSIS — Z1231 Encounter for screening mammogram for malignant neoplasm of breast: Secondary | ICD-10-CM | POA: Diagnosis not present

## 2016-09-02 ENCOUNTER — Encounter: Payer: Self-pay | Admitting: Orthopedic Surgery

## 2016-09-09 DIAGNOSIS — Z0001 Encounter for general adult medical examination with abnormal findings: Secondary | ICD-10-CM | POA: Diagnosis not present

## 2016-09-09 DIAGNOSIS — E039 Hypothyroidism, unspecified: Secondary | ICD-10-CM | POA: Diagnosis not present

## 2016-09-09 DIAGNOSIS — I10 Essential (primary) hypertension: Secondary | ICD-10-CM | POA: Diagnosis not present

## 2016-09-09 DIAGNOSIS — Z6837 Body mass index (BMI) 37.0-37.9, adult: Secondary | ICD-10-CM | POA: Diagnosis not present

## 2016-09-09 DIAGNOSIS — Z1389 Encounter for screening for other disorder: Secondary | ICD-10-CM | POA: Diagnosis not present

## 2016-09-16 DIAGNOSIS — Z0000 Encounter for general adult medical examination without abnormal findings: Secondary | ICD-10-CM | POA: Diagnosis not present

## 2016-09-16 DIAGNOSIS — Z1389 Encounter for screening for other disorder: Secondary | ICD-10-CM | POA: Diagnosis not present

## 2016-09-16 DIAGNOSIS — Z0001 Encounter for general adult medical examination with abnormal findings: Secondary | ICD-10-CM | POA: Diagnosis not present

## 2016-09-17 NOTE — Patient Instructions (Signed)
[name]  09/17/2016     @PREFPERIOPPHARMACY @   Your procedure is scheduled on  09/25/2016   [facility] at  [facility]  [facility].M.  Call this number if you have problems the morning of surgery:  [phone]   Remember:  Do not eat food or drink liquids after midnight.  Take these medicines the morning of surgery with A SIP OF WATER  Voltaren, hydrodiuril, hydrocodone, relafen, levothyroxine.   Do not wear jewelry, make-up or nail polish.  Do not wear lotions, powders, or perfumes, or deoderant.  Do not shave 48 hours prior to surgery.  Men may shave face and neck.  Do not bring valuables to the hospital.  [facility] not responsible for any belongings or valuables.  Contacts, dentures or bridgework may not be worn into surgery.  Leave your suitcase in the car.  After surgery it may be brought to your room.  For patients admitted to the hospital, discharge time will be determined by your treatment team.  Patients discharged the day of surgery will not be allowed to drive home.   Name and phone number of your driver:   family Special instructions:  none  Please read over the following fact sheets that you were given. Coughing and Deep Breathing, Blood Transfusion Information, Total Joint Packet, MRSA Information, Surgical Site Infection Prevention, Anesthesia Post-op Instructions and Care and Recovery After Surgery       Total Knee Replacement Total knee replacement is a procedure to replace the knee joint with an artificial (prosthetic) knee joint. The purpose of this surgery is to reduce knee pain and improve knee function. The prosthetic knee joint (prosthesis) is usually made of metal and plastic. It replaces parts of the thigh bone (femur), lower leg bone (tibia), and kneecap (patella) that are removed during the procedure. Tell a health care provider about:  Any allergies you have.  All medicines you are taking, including vitamins, herbs, eye drops,  creams, and over-the-counter medicines.  Any problems you or family members have had with anesthetic medicines.  Any blood disorders you have.  Any surgeries you have had.  Any medical conditions you have.  Whether you are pregnant or may be pregnant. What are the risks? Generally, this is a safe procedure. However, problems may occur, including:  Infection.  Bleeding.  Allergic reactions to medicines.  Damage to other structures or organs.  Decreased range of motion of the knee.  Instability of the knee.  Loosening of the prosthetic joint.  Knee pain that does not go away (chronic pain). What happens before the procedure?  Ask your health care provider about:  Changing or stopping your regular medicines. This is especially important if you are taking diabetes medicines or blood thinners.  Taking medicines such as aspirin and ibuprofen. These medicines can thin your blood. Do not take these medicines before your procedure if your health care provider instructs you not to.  Have dental care and routine cleanings completed before your procedure. Plan to not have dental work done for 3 months after your procedure. Germs from anywhere in your body, including your mouth, can travel to your new joint and infect it.  Follow instructions from your health care provider about eating or drinking restrictions.  Ask your health care provider how your surgical site will be marked or identified.  You may be given antibiotic medicine to help prevent infection.  If your health care provider prescribes  physical therapy, do exercises as instructed.  Do not use any tobacco products, such as cigarettes, chewing tobacco, or e-cigarettes. If you need help quitting, ask your health care provider.  You may have a physical exam.  You may have tests, such as:  X-rays.  MRI.  CT scan.  Bone scans.  You may have a blood or urine sample taken.  Plan to have someone take you home  after the procedure.  If you will be going home right after the procedure, plan to have someone with you for at least 24 hours. It is recommended that you have someone to help care for you for at least 4-6 weeks after your procedure. What happens during the procedure?  To reduce your risk of infection:  Your health care team will wash or sanitize their hands.  Your skin will be washed with soap.  An IV tube will be inserted into one of your veins.  You will be given one or more of the following:  A medicine to help you relax (sedative).  A medicine to numb the area (local anesthetic).  A medicine to make you fall asleep (general anesthetic).  A medicine that is injected into your spine to numb the area below and slightly above the injection site (spinal anesthetic).  A medicine that is injected into an area of your body to numb everything below the injection site (regional anesthetic).  An incision will be made in your knee.  Damaged cartilage and bone will be removed from your femur, tibia, and patella.  Parts of the prosthesis (liners)will be placed over the areas of bone and cartilage that were removed. A metal liner will be placed over your femur, and plastic liners will be placed over your tibia and the underside of your patella.  One or more small tubes (drains) may be placed near your incision to help drain extra fluid from your surgical site.  Your incision will be closed with stitches (sutures), skin glue, or adhesive strips. Medicine may be applied to your incision.  A bandage (dressing) will be placed over your incision. The procedure may vary among health care providers and hospitals. What happens after the procedure?  Your blood pressure, heart rate, breathing rate, and blood oxygen level will be monitored often until the medicines you were given have worn off.  You may continue to receive fluids and medicines through an IV tube.  You will have some pain. Pain  medicines will be available to help you.  You may have fluid coming from one or more drains in your incision.  You may have to wear compression stockings. These stockings help to prevent blood clots and reduce swelling in your legs.  You will be encouraged to move around as much as possible.  You may be given a continuous passive motion machine to use at home. You will be shown how to use this machine.  Do not drive for 24 hours if you received a sedative. This information is not intended to replace advice given to you by your health care provider. Make sure you discuss any questions you have with your health care provider. Document Released: 12/15/2000 Document Revised: 05/12/2016 Document Reviewed: 08/15/2015 Elsevier Interactive Patient Education  2017 Else[employer].  Total Knee Replacement, Care After These instructions give you information about caring for yourself after your procedure. Your doctor may also give you more specific instructions. Call your doctor if you have any problems or questions after your procedure. Follow these instructions at  home: Medicines  Take over-the-counter and prescription medicines only as told by your doctor.  If you were prescribed an antibiotic medicine, take it as told by your doctor. Do not stop taking the antibiotic even if you start to feel better.  If you were prescribed a blood thinner (anticoagulant), take it as told by your doctor. If you have a splint or brace:  Wear the splint or brace as told by your doctor. Remove it only as told by your doctor.  Loosen the splint or brace if your toes tingle, get numb, or turn cold and blue.  Do not let your splint or brace get wet if it is not waterproof.  Keep the splint or brace clean. Bathing  Do not take baths, swim, or use a hot tub until your doctor says it is okay. Ask your doctor if you can take showers. You may only be allowed to take sponge baths for bathing.  If you have a splint or  brace that is not waterproof, cover it with a watertight covering when you take a bath or a shower.  Keep your bandage (dressing) dry until your doctor says it can be taken off. Incision care and drain care  Check your cut from surgery (incision) and your drain every day for signs of infection. Check for:  More redness, swelling, or pain.  More fluid or blood.  Warmth.  Pus or a bad smell.  Follow instructions from your doctor about how to take care of your cut from surgery. Make sure you:  Wash your hands with soap and water before you change your bandage. If you cannot use soap and water, use hand sanitizer.  Change your bandage as told by your doctor.  Leave stitches (sutures), skin glue, or skin tape (adhesive) strips in place. They may need to stay in place for 2 weeks or longer. If tape strips get loose and curl up, you may trim the loose edges. Do not remove tape strips completely unless your doctor says it is okay.  If you have a drain, follow instructions from your doctor about caring for it. Do not remove the drain tube or any bandages unless your doctor says it is okay. Managing pain, stiffness, and swelling  If directed, put ice on your knee.  Put ice in a plastic bag.  Place a towel between your skin and the bag.  Leave the ice on for 20 minutes, 2-3 times per day.  If directed, apply heat to the affected area as often as told by your doctor. Use the heat source that your doctor recommends, such as a moist heat pack or a heating pad.  Place a towel between your skin and the heat source.  Leave the heat on for 20-30 minutes.  Remove the heat if your skin turns bright red. This is especially important if you are unable to feel pain, heat, or cold. You may have a greater risk of getting burned.  Move your toes often to avoid stiffness and to lessen swelling.  Raise (elevate) your knee above the level of your heart while you are sitting or lying down.  Wear  elastic knee support for as long as told by your doctor. Driving  Do not drive until your doctor says it is okay. Ask your doctor when it is safe to drive if you have a splint or brace on your knee.  Do not drive or use heavy machinery while taking prescription pain medicine.  Do not drive for  24 hours if you received a sedative. Activity  Do not lift anything that is heavier than 10 lb (4.5 kg) until your doctor says it is okay.  Do not play contact sports until your doctor says it is okay.  Avoid high-impact activities, including running, jumping rope, and jumping jacks.  Avoid sitting for a long time without moving. Get up and move around at least every few hours.  If physical therapy was prescribed, do exercises as told by your doctor.  Return to your normal activities as told by your doctor. Ask your doctor what activities are safe for you. Safety  Do not use your leg to support your body weight until your doctor says that you can. Use crutches or a walker as told by your doctor. General instructions  Do not have any dental work done for at least 3 months after your surgery. When you do have dental work done, tell your dentist about your joint replacement.  Do not use any tobacco products, such as cigarettes, chewing tobacco, or e-cigarettes. If you need help quitting, ask your doctor.  Wear special socks (compression stockings) as told by your doctor.  If you have been sent home with a knee joint motion machine (continuous passive motion machine), use it as told by your doctor.  Drink enough fluid to keep your pee (urine) clear or pale yellow.  If you have been told to lose weight, follow instructions from your doctor about how to do this safely.  Keep all follow-up visits as told by your doctor. This is important. Contact a doctor if:  You have more redness, swelling, or pain around your cut from surgery or your drain.  You have more fluid or blood coming from your  cut from surgery or your drain.  Your cut from surgery or your drain area feels warm to the touch.  You have pus or a bad smell coming from your cut from surgery or your drain.  You have a fever.  Your cut breaks open after your doctor removes your stitches, skin glue, or skin tape strips.  Your new joint feels loose.  You have knee pain that does not go away. Get help right away if:  You have a rash.  You have pain in your calf or thigh.  You have swelling in your calf or thigh.  You have shortness of breath.  You have trouble breathing.  You have chest pain.  Your ability to move your knee is getting worse. This information is not intended to replace advice given to you by your health care provider. Make sure you discuss any questions you have with your health care provider. Document Released: 12/01/2011 Document Revised: 05/12/2016 Document Reviewed: 08/15/2015 Elsevier Interactive Patient Education  2017 Elsevier Inc. PATIENT INSTRUCTIONS POST-ANESTHESIA  IMMEDIATELY FOLLOWING SURGERY:  Do not drive or operate machinery for the first twenty four hours after surgery.  Do not make any important decisions for twenty four hours after surgery or while taking narcotic pain medications or sedatives.  If you develop intractable nausea and vomiting or a severe headache please notify your doctor immediately.  FOLLOW-UP:  Please make an appointment with your surgeon as instructed. You do not need to follow up with anesthesia unless specifically instructed to do so.  WOUND CARE INSTRUCTIONS (if applicable):  Keep a dry clean dressing on the anesthesia/puncture wound site if there is drainage.  Once the wound has quit draining you may leave it open to air.  Generally you should  leave the bandage intact for twenty four hours unless there is drainage.  If the epidural site drains for more than 36-48 hours please call the anesthesia department.  QUESTIONS?:  Please feel free to call your  physician or the hospital operator if you have any questions, and they will be happy to assist you.

## 2016-09-19 ENCOUNTER — Encounter (HOSPITAL_COMMUNITY)
Admission: RE | Admit: 2016-09-19 | Discharge: 2016-09-19 | Disposition: A | Discharge status: Home / Self Care | Payer: BLUE CROSS/BLUE SHIELD | Source: Ambulatory Visit | Attending: Orthopedic Surgery | Admitting: Orthopedic Surgery

## 2016-09-19 ENCOUNTER — Encounter (HOSPITAL_COMMUNITY): Payer: Self-pay

## 2016-09-19 DIAGNOSIS — Z01810 Encounter for preprocedural cardiovascular examination: Secondary | ICD-10-CM | POA: Diagnosis not present

## 2016-09-19 DIAGNOSIS — Z01812 Encounter for preprocedural laboratory examination: Secondary | ICD-10-CM | POA: Diagnosis not present

## 2016-09-19 LAB — COMPREHENSIVE METABOLIC PANEL
ALT: 20 U/L (ref 14–54)
AST: 23 U/L (ref 15–41)
Albumin: 3.6 g/dL (ref 3.5–5.0)
Alkaline Phosphatase: 103 U/L (ref 38–126)
Anion gap: 8 (ref 5–15)
BUN: 15 mg/dL (ref 6–20)
CHLORIDE: 99 mmol/L — AB (ref 101–111)
CO2: 27 mmol/L (ref 22–32)
Calcium: 8.3 mg/dL — ABNORMAL LOW (ref 8.9–10.3)
Creatinine, Ser: 0.61 mg/dL (ref 0.44–1.00)
GFR calc non Af Amer: >60 mL/min (ref >60)
Glucose, Bld: 117 mg/dL — ABNORMAL HIGH (ref 65–99)
POTASSIUM: 3.2 mmol/L — AB (ref 3.5–5.1)
SODIUM: 134 mmol/L — AB (ref 135–145)
Total Bilirubin: 0.7 mg/dL (ref 0.3–1.2)
Total Protein: 6.6 g/dL (ref 6.5–8.1)

## 2016-09-19 LAB — CBC WITH DIFFERENTIAL/PLATELET
BASOS ABS: 0 10*3/uL (ref 0.0–0.1)
Basophils Relative: 0 %
EOS ABS: 0.3 10*3/uL (ref 0.0–0.7)
Eosinophils Relative: 3 %
HCT: 40.1 % (ref 36.0–46.0)
Hemoglobin: 13.5 g/dL (ref 12.0–15.0)
Lymphocytes Relative: 18 %
Lymphs Abs: 1.9 10*3/uL (ref 0.7–4.0)
MCH: 30.1 pg (ref 26.0–34.0)
MCHC: 33.7 g/dL (ref 30.0–36.0)
MCV: 89.5 fL (ref 78.0–100.0)
Monocytes Absolute: 0.6 10*3/uL (ref 0.1–1.0)
Monocytes Relative: 6 %
Neutro Abs: 7.5 10*3/uL (ref 1.7–7.7)
Neutrophils Relative %: 73 %
PLATELETS: 190 10*3/uL (ref 150–400)
RBC: 4.48 MIL/uL (ref 3.87–5.11)
RDW: 13.6 % (ref 11.5–15.5)
WBC: 10.3 10*3/uL (ref 4.0–10.5)

## 2016-09-19 LAB — SURGICAL PCR SCREEN
MRSA, PCR: NEGATIVE
STAPHYLOCOCCUS AUREUS: NEGATIVE

## 2016-09-19 LAB — HCG, SERUM, QUALITATIVE: PREG SERUM: NEGATIVE

## 2016-09-19 LAB — PREPARE RBC (CROSSMATCH)

## 2016-09-19 LAB — ABO/RH: ABO/RH(D): O POS

## 2016-09-23 ENCOUNTER — Other Ambulatory Visit: Payer: Self-pay | Admitting: Orthopedic Surgery

## 2016-09-23 ENCOUNTER — Encounter (HOSPITAL_COMMUNITY): Payer: Self-pay | Admitting: Anesthesiology

## 2016-09-23 ENCOUNTER — Other Ambulatory Visit (HOSPITAL_COMMUNITY): Payer: BLUE CROSS/BLUE SHIELD

## 2016-09-23 ENCOUNTER — Telehealth: Payer: Self-pay | Admitting: Orthopedic Surgery

## 2016-09-23 DIAGNOSIS — Z01810 Encounter for preprocedural cardiovascular examination: Secondary | ICD-10-CM | POA: Diagnosis not present

## 2016-09-23 DIAGNOSIS — Z01812 Encounter for preprocedural laboratory examination: Secondary | ICD-10-CM | POA: Diagnosis not present

## 2016-09-23 LAB — PROTIME-INR
INR: 0.91
Prothrombin Time: 12.2 seconds (ref 11.4–15.2)

## 2016-09-23 LAB — APTT: aPTT: 29 seconds (ref 24–36)

## 2016-09-23 MED ORDER — POTASSIUM CHLORIDE CRYS ER 20 MEQ PO TBCR: 40.0000 meq | EXTENDED_RELEASE_TABLET | Freq: Three times a day (TID) | ORAL | 0 refills | Status: DC

## 2016-09-23 NOTE — Telephone Encounter (Signed)
Kim from [facility] called saying that the patient's potassium level is 3.2 and she has surgery in 2 days. Dr. Jayme CloudGonzalez wants her put on a supplement.  Please give her a call and let her know what Dr. Romeo AppleHarrison wants to do.

## 2016-09-23 NOTE — Telephone Encounter (Signed)
ROUTING TO DR HARRISON TO ADVISE 

## 2016-09-23 NOTE — OR Nursing (Signed)
Potassium of 3.2 shown to Dr Jayme CloudGonzalez. Instructed to call Dr Romeo AppleHarrison and have patient put on potassium and draw I-Stat on arrival morning of surgery. Called Dr Harrison's office and got voicemail. Left message for Dr Romeo AppleHarrison or Asher MuirJamie to call back with treatment plan.

## 2016-09-24 ENCOUNTER — Encounter: Payer: Self-pay | Admitting: Orthopedic Surgery

## 2016-09-24 MED ORDER — TRANEXAMIC ACID 1000 MG/10ML IV SOLN
1000.0000 mg | INTRAVENOUS | Status: AC
  Filled 2016-09-24: qty 10

## 2016-09-24 MED ORDER — TRANEXAMIC ACID 1000 MG/10ML IV SOLN
1000.0000 mg | INTRAVENOUS | Status: DC
  Filled 2016-09-24: qty 10

## 2016-09-25 ENCOUNTER — Encounter: Payer: Self-pay | Admitting: Orthopedic Surgery

## 2016-09-25 ENCOUNTER — Inpatient Hospital Stay (HOSPITAL_COMMUNITY)
Admission: RE | Admit: 2016-09-25 | Payer: BLUE CROSS/BLUE SHIELD | Source: Ambulatory Visit | Admitting: Orthopedic Surgery

## 2016-09-25 SURGERY — ARTHROPLASTY, KNEE, TOTAL
Anesthesia: Choice | Laterality: Left

## 2016-10-01 LAB — TYPE AND SCREEN
BLOOD PRODUCT EXPIRATION DATE: 201801102359
BLOOD PRODUCT EXPIRATION DATE: 201801102359
ISSUE DATE / TIME: 201801092201
ISSUE DATE / TIME: 201801100102
UNIT TYPE AND RH: 5100
Unit Type and Rh: 5100

## 2016-10-07 ENCOUNTER — Ambulatory Visit: Payer: BLUE CROSS/BLUE SHIELD | Admitting: Orthopedic Surgery

## 2016-10-21 ENCOUNTER — Other Ambulatory Visit: Payer: Self-pay | Admitting: *Deleted

## 2016-10-21 DIAGNOSIS — M25462 Effusion, left knee: Secondary | ICD-10-CM

## 2016-10-21 DIAGNOSIS — M1712 Unilateral primary osteoarthritis, left knee: Secondary | ICD-10-CM

## 2016-10-21 MED ORDER — HYDROCODONE-ACETAMINOPHEN 5-325 MG PO TABS: 1.0000 | ORAL_TABLET | Freq: Three times a day (TID) | ORAL | 0 refills | Status: DC | PRN

## 2016-10-27 ENCOUNTER — Encounter: Payer: Self-pay | Admitting: Orthopedic Surgery

## 2016-10-27 NOTE — Progress Notes (Signed)
[place] controlled substance reporting system reviewed  

## 2016-12-08 ENCOUNTER — Telehealth: Payer: Self-pay | Admitting: Orthopedic Surgery

## 2016-12-08 NOTE — Telephone Encounter (Signed)
Okay to bring in for first available appt.

## 2016-12-08 NOTE — Telephone Encounter (Signed)
Called back to patient; appointment scheduled for tomorrow; patient aware.

## 2016-12-08 NOTE — Telephone Encounter (Signed)
Patient called to inquire about an immediate appointment for possible aspiration/injection of left knee.  Her surgery which was scheduled 09/23/16  (total knee arthroplasty) cancelled due to medical/dental issues.  Not seeing that clearance has been received.  Okay to bring in for appointment, or other advice?  Ph# [phone]

## 2016-12-09 ENCOUNTER — Ambulatory Visit (INDEPENDENT_AMBULATORY_CARE_PROVIDER_SITE_OTHER): Payer: BLUE CROSS/BLUE SHIELD | Admitting: Orthopedic Surgery

## 2016-12-09 ENCOUNTER — Encounter: Payer: Self-pay | Admitting: Orthopedic Surgery

## 2016-12-09 VITALS — BP 148/93 | HR 75 | Ht 63.0 in | Wt 223.0 lb

## 2016-12-09 DIAGNOSIS — M1712 Unilateral primary osteoarthritis, left knee: Secondary | ICD-10-CM

## 2016-12-09 DIAGNOSIS — M25462 Effusion, left knee: Secondary | ICD-10-CM | POA: Diagnosis not present

## 2016-12-09 NOTE — Progress Notes (Signed)
Patient ID: [name], female   DOB: 23-Aug-1966, 51 y.o.   MRN: [id number]  Chief Complaint  Patient presents with  . Follow-up    Left knee    HPI [name] is a 51 y.o. female.  51 year old female previously scheduled for knee replacement. It was canceled secondary to abscessed tooth  Presents back with increasing dull aching pain and swelling over the last week in her left knee  She is anxious to reschedule surgery as well.  Review of Systems Review of Systems  Constitutional: Negative for fever and unexpected weight change.  Gastrointestinal: Negative.   Genitourinary: Negative.   Neurological: Negative for numbness.   (2 MINIMUM)  Past Medical History:  Diagnosis Date  . Arthritis   . Back pain 10/30/2015  . Bilateral shoulder pain 10/30/2015  . COPD (chronic obstructive pulmonary disease) (HCC)   . Fecal occult blood test positive 10/30/2015  . Hemorrhoids 10/30/2015  . Hypertension   . Hypothyroidism   . Large breasts 10/30/2015   44 DD  . Postmenopausal 10/30/2015  . Sleep apnea    pt DX but did not want to use CPAP.  Marland Kitchen Thyroid disease     Past Surgical History:  Procedure Laterality Date  . COLONOSCOPY N/A 11/12/2015   Procedure: COLONOSCOPY;  Surgeon: West Bali, MD;  Location: [facility];  Service: Endoscopy;  Laterality: N/A;  8:30 AM  . KNEE ARTHROSCOPY WITH MEDIAL MENISECTOMY Left 05/04/2015   Procedure: KNEE ARTHROSCOPY WITH MEDIAL AND LATERAL MENISECTOMY;  Surgeon: Vickki Hearing, MD;  Location: [facility] ORS;  Service: Orthopedics;  Laterality: Left;  . NASAL SINUS SURGERY      Social History Social History  Substance Use Topics  . Smoking status: Never Smoker  . Smokeless tobacco: Never Used  . Alcohol use No    Allergies  Allergen Reactions  . Penicillins Other (See Comments)    Yeast infection Has patient had a PCN reaction causing immediate rash, facial/tongue/throat swelling, SOB or lightheadedness with hypotension: No Has patient had a  PCN reaction causing severe rash involving mucus membranes or skin necrosis: No Has patient had a PCN reaction that required hospitalization No Has patient had a PCN reaction occurring within the last 10 years: No If all of the above answers are "NO", then may proceed with Cephalosporin use.     Current Meds  Medication Sig  . clobetasol cream (TEMOVATE) 0.05 % Apply 1 application topically 2 (two) times daily as needed.   . diclofenac (VOLTAREN) 75 MG EC tablet Take 75 mg by mouth 2 (two) times daily.  . hydrochlorothiazide (HYDRODIURIL) 25 MG tablet Take 25 mg by mouth daily.  Marland Kitchen HYDROcodone-acetaminophen (NORCO/VICODIN) 5-325 MG tablet Take 1 tablet by mouth every 8 (eight) hours as needed for moderate pain.  Marland Kitchen levothyroxine (SYNTHROID, LEVOTHROID) 25 MCG tablet Take 25 mcg by mouth daily before breakfast.  . Potassium Bicarb & Chloride (POT BICARB-POT CHLORIDE PO) Take 250 mg by mouth.  . pravastatin (PRAVACHOL) 20 MG tablet Take 20 mg by mouth every evening.   . TURMERIC PO Take 1 tablet by mouth daily.  . [DISCONTINUED] Potassium Gluconate 550 MG TABS Take 1 tablet by mouth daily.      Physical Exam Physical Exam 1.BP (!) 148/93   Pulse 75   Ht 5\' 3"  (1.6 m)   Wt 223 lb (101.2 kg)   LMP 04/14/2011   BMI 39.50 kg/m   2. Gen. appearance. The patient is well-developed and well-nourished, grooming  and hygiene are normal. There are no gross congenital abnormalities  3. The patient is alert and oriented to person place and time  4. Mood and affect are normal  5. Ambulation : Severe altered gait   Examination reveals the following: 6. On inspection we find moderate size joint effusion with joint line tenderness medially and laterally as well as tenderness around the entire knee joint  7. With the range of motion of  she has a flexion contracture 5 he has a knee flexion arc of 105  8. Stability tests were normal    9. Strength tests revealed grade 5 motor strength  10.  Skin we find no rash ulceration or erythema  11. Sensation remains intact  12 Vascular system shows no peripheral edema    MEDICAL DECISION MAKING:    Data Reviewed   Assessment Left knee effusion, chronic left knee pain with left knee osteoarthritis  She has another tooth that needs to be pulled and needs to see the dentist  Plan Procedure note injection and aspiration left knee joint  Verbal consent was obtained to aspirate and inject the left knee joint   Timeout was completed to confirm the site of aspiration and injection  An 18-gauge needle was used to aspirate the left knee joint from a suprapatellar lateral approach.  The medications used were 40 mg of Depo-Medrol and 1% lidocaine 3 cc  Anesthesia was provided by ethyl chloride and the skin was prepped with alcohol.  After cleaning the skin with alcohol an 18-gauge needle was used to aspirate the right knee joint.  We obtained 25  cc of fluid  We followed this by injection of 40 mg of Depo-Medrol and 3 cc 1% lidocaine.  There were no complications. A sterile bandage was applied.  Scheduled for left total knee See the dentist before April 10  Fuller CanadaStanley Harrison 12/09/2016, 11:23 AM

## 2016-12-09 NOTE — Patient Instructions (Signed)
You have received an injection of steroids into the joint. 15% of patients will have increased pain within the 24 hours postinjection.   This is transient and will go away.   We recommend that you use ice packs on the injection site for 20 minutes every 2 hours and extra strength Tylenol 2 tablets every 8 as needed until the pain resolves.  If you continue to have pain after taking the Tylenol and using the ice please call the office for further instructions.  You have decided to proceed with knee replacement surgery. You have decided not to continue with nonoperative measures such as but not limited to oral medication, weight loss, activity modification, physical therapy, bracing, or injection.  We will perform the procedure commonly known as total knee replacement. Some of the risks associated with knee replacement surgery include but are not limited to Bleeding Infection Swelling Stiffness Blood clot Pain that persists even after surgery  Infection is especially devastating complication of knee surgery although rare. If infection does occur your implant will usually have to be removed and several surgeries and antibiotics will be needed to eradicate the infection prior to performing a repeat replacement.   In some cases amputation is required to eradicate the infection. In other rare cases a knee fusion is needed    If you're not comfortable with these risks and would like to continue with nonoperative treatment please let Dr. Kitana Gage know prior to your surgery.  

## 2016-12-10 ENCOUNTER — Encounter: Payer: Self-pay | Admitting: *Deleted

## 2016-12-10 NOTE — Progress Notes (Signed)
Hydrocodone pre authorization approved 12/10/16-12/10/2017 auth # [id number]

## 2017-01-06 ENCOUNTER — Other Ambulatory Visit: Payer: Self-pay | Admitting: *Deleted

## 2017-01-07 IMAGING — MG MM SCREEN MAMMOGRAM BILATERAL
4 series · 4 of 4 positions shown · non-contrast
Comparison: Previous exam(s).

CLINICAL DATA: Screening.

EXAM:
DIGITAL SCREENING BILATERAL MAMMOGRAM WITH CAD

[R CC]
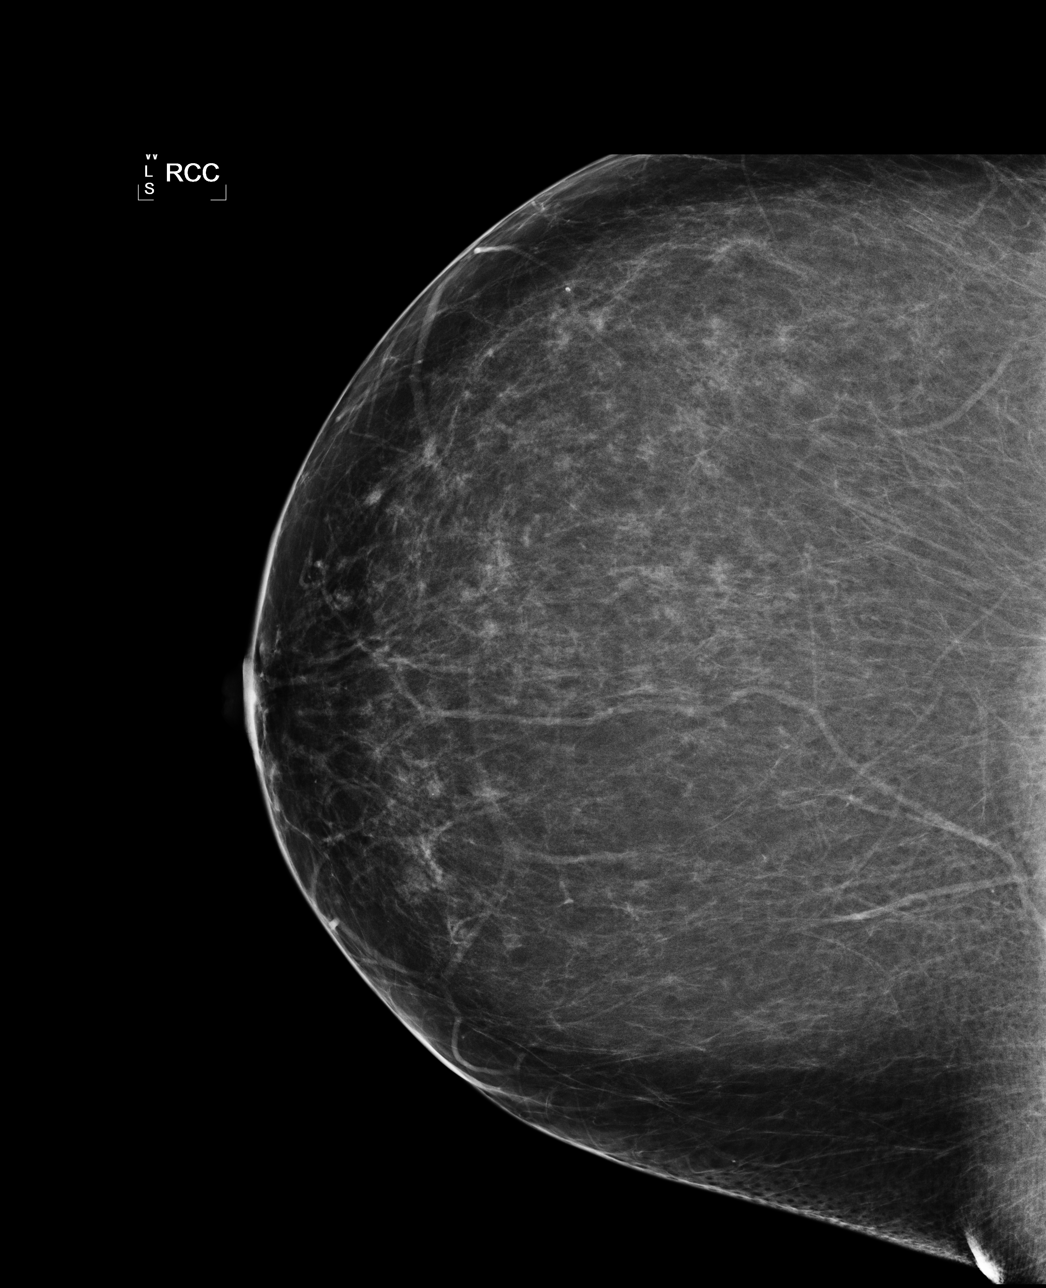

[L CC]
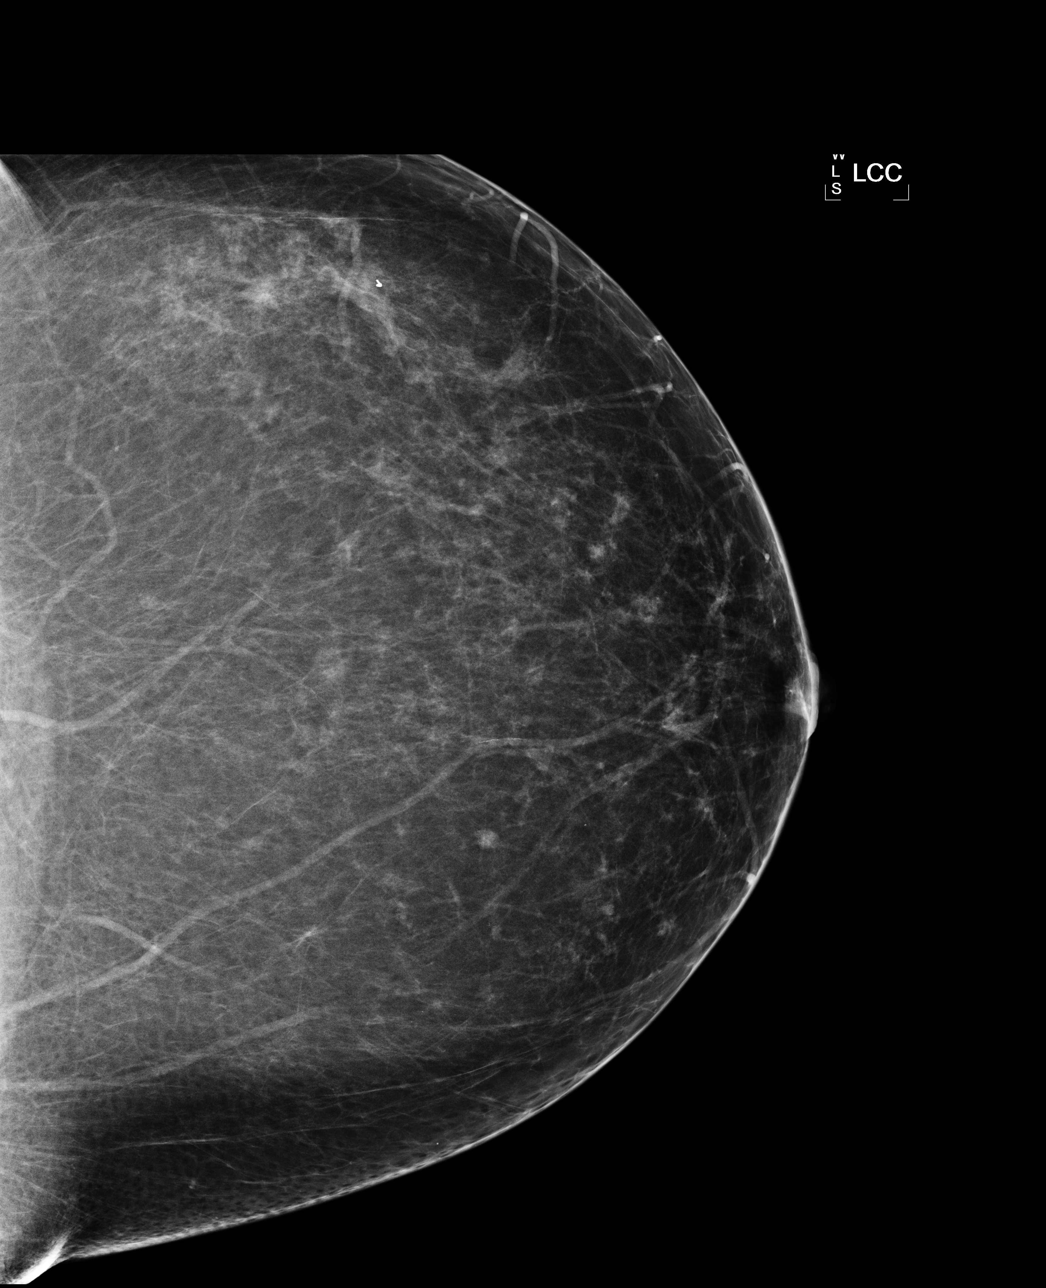

[L MLO]
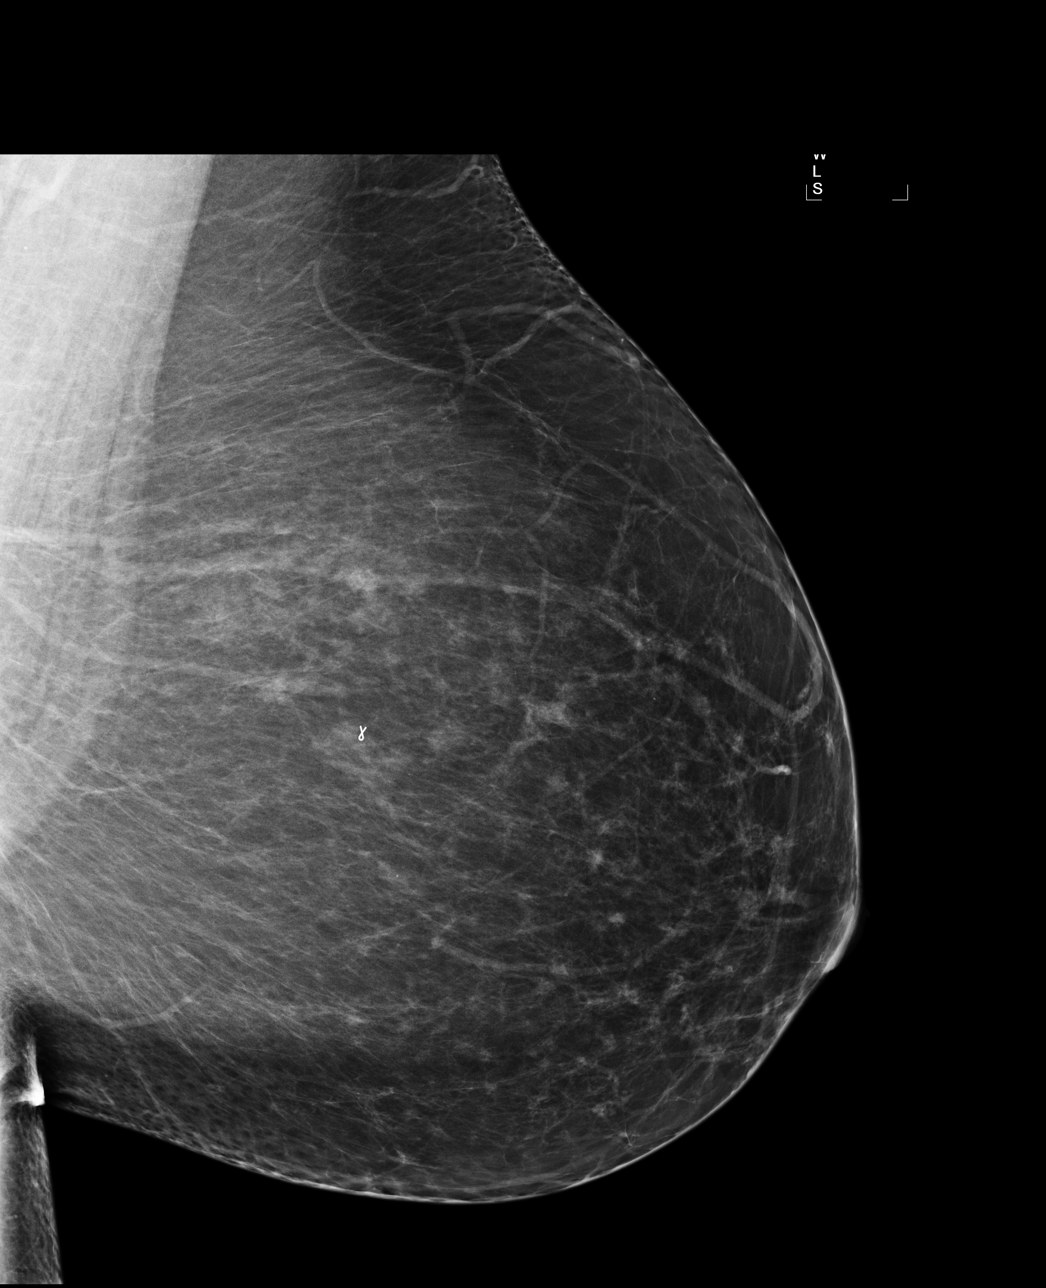

[R MLO]
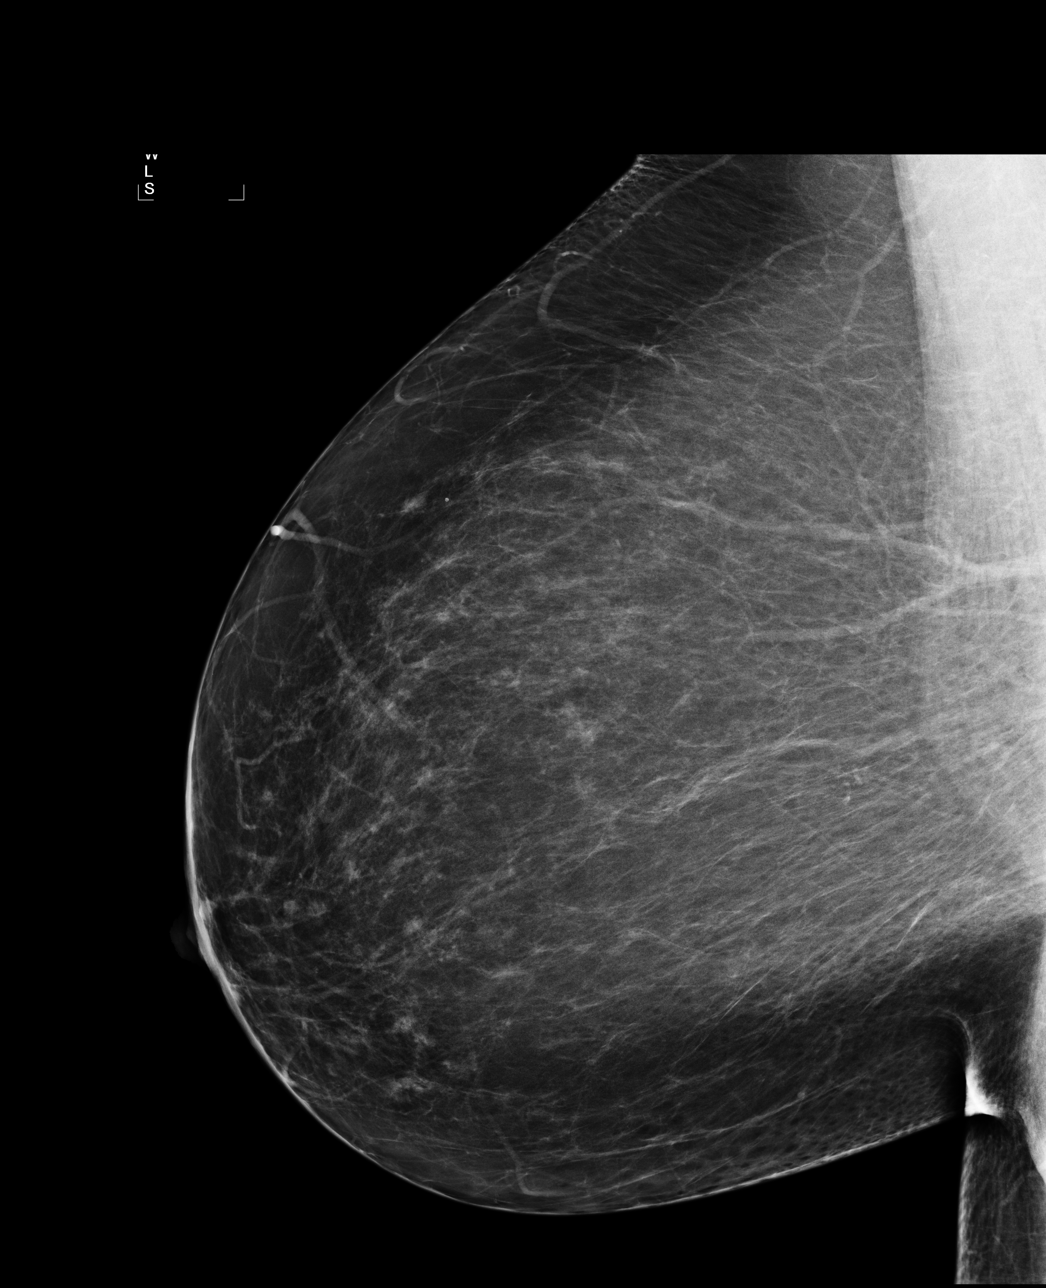

[4 of 4 positions shown; findings below may reference images not displayed]

ACR Breast Density Category b: There are scattered areas of
fibroglandular density.
FINDINGS: There are no findings suspicious for malignancy. Images were
processed with CAD.
IMPRESSION: No mammographic evidence of malignancy. A result letter of this
screening mammogram will be mailed directly to the patient.

RECOMMENDATION:
Screening mammogram in one year. (Code:AS-G-LCT)

BI-RADS CATEGORY  1: Negative.

## 2017-01-09 DIAGNOSIS — M25569 Pain in unspecified knee: Secondary | ICD-10-CM | POA: Diagnosis not present

## 2017-01-09 DIAGNOSIS — Z6838 Body mass index (BMI) 38.0-38.9, adult: Secondary | ICD-10-CM | POA: Diagnosis not present

## 2017-01-09 DIAGNOSIS — M1711 Unilateral primary osteoarthritis, right knee: Secondary | ICD-10-CM | POA: Diagnosis not present

## 2017-01-12 DIAGNOSIS — H35372 Puckering of macula, left eye: Secondary | ICD-10-CM | POA: Diagnosis not present

## 2017-01-13 ENCOUNTER — Encounter: Payer: Self-pay | Admitting: Orthopedic Surgery

## 2017-01-13 ENCOUNTER — Telehealth: Payer: Self-pay | Admitting: Orthopedic Surgery

## 2017-01-13 NOTE — Telephone Encounter (Signed)
APPROVED

## 2017-01-13 NOTE — Telephone Encounter (Signed)
Patient called to request out of work now, as of tomorrow's date, 01/14/17, due to the knee pain increasing.  Her surgery is scheduled 01/29/17.  Please advise.  Ph # [phone]

## 2017-01-13 NOTE — Telephone Encounter (Signed)
Called back to patient; note issued accordingly.

## 2017-01-20 ENCOUNTER — Encounter: Payer: Self-pay | Admitting: *Deleted

## 2017-01-20 NOTE — Progress Notes (Signed)
CPT [id number] approved for 1 day case # [id number] received per Freddie date of surgery updated to May 10,2018

## 2017-01-21 ENCOUNTER — Other Ambulatory Visit: Payer: Self-pay | Admitting: *Deleted

## 2017-01-21 DIAGNOSIS — Z96652 Presence of left artificial knee joint: Secondary | ICD-10-CM

## 2017-01-23 NOTE — Patient Instructions (Signed)
[name]  01/23/2017     @PREFPERIOPPHARMACY @   Your procedure is scheduled on  01/29/2017   Report to [facility] at  615  A.M.  Call this number if you have problems the morning of surgery:  [phone]   Remember:  Do not eat food or drink liquids after midnight.  Take these medicines the morning of surgery with A SIP OF WATER  voltaren, hydrocodone, levothyroxine. Stop your tumeric until after surgery.   Do not wear jewelry, make-up or nail polish.  Do not wear lotions, powders, or perfumes, or deoderant.  Do not shave 48 hours prior to surgery.  Men may shave face and neck.  Do not bring valuables to the hospital.  [facility] is not responsible for any belongings or valuables.  Contacts, dentures or bridgework may not be worn into surgery.  Leave your suitcase in the car.  After surgery it may be brought to your room.  For patients admitted to the hospital, discharge time will be determined by your treatment team.  Patients discharged the day of surgery will not be allowed to drive home.   Name and phone number of your driver:   family Special instructions:  None  Please read over the following fact sheets that you were given. Pain Booklet, Coughing and Deep Breathing, Blood Transfusion Information, Total Joint Packet, MRSA Information, Surgical Site Infection Prevention, Anesthesia Post-op Instructions and Care and Recovery After Surgery       Spinal Anesthesia and Epidural Anesthesia Spinal anesthesia and epidural anesthesia are methods of numbing the body. They are done by injecting numbing medicine (anesthetic) into the back, near the spinal cord. Spinal anesthesia is usually done to numb an area at and below the place where the injection is made. It is often used during surgeries of the pelvis, hips, legs, and lower abdomen. It begins to work almost immediately. Epidural anesthesia may be done to numb an area above or below the area where the  injection is made. It is often used during childbirth and after major abdominal or chest surgery. It begins to work after 10-20 minutes. Tell a health care provider about:  Any allergies you have.  All medicines you are taking, including vitamins, herbs, eye drops, creams, and over-the-counter medicines.  Any problems you or family members have had with anesthetic medicines.  Any blood disorders you have.  Any surgeries you have had.  Any medical conditions you have.  Whether you are pregnant or may be pregnant.  Any recent alcohol, tobacco, or drug use. What are the risks? Generally, this is a safe procedure. However, problems may occur, including:  Headache.  A drop in blood pressure. In some cases, this can lead to a heart attack or a stroke.  Nerve damage.  Infection.  Allergic reaction to medicines.  Seizures.  Spinal fluid leak.  Bleeding around the injection site.  Inability to breathe. If this happens, a tube may be put into your windpipe (trachea) and a machine may be used to help you breathe until the anesthetic wears off.  Long-lasting numbness, pain, or loss of function of body parts.  Nausea and vomiting.  Dizziness and fainting.  Shivering.  Itching. What happens before the procedure? Staying hydrated  Follow instructions from your health care provider about hydration, which may include:  Up to 2 hours before the procedure - you may continue to drink clear liquids, such as water, clear fruit  juice, black coffee, and plain tea. Eating and drinking restrictions  Follow instructions from your health care provider about eating and drinking, which may include:  8 hours before the procedure - stop eating heavy meals or foods such as meat, fried foods, or fatty foods.  6 hours before the procedure - stop eating light meals or foods, such as toast or cereal.  6 hours before the procedure - stop drinking milk or drinks that contain milk.  2 hours  before the procedure - stop drinking clear liquids. Medicine  Ask your health care provider about:  Changing or stopping your regular medicines. This is especially important if you are taking diabetes medicines or blood thinners.  Changing or stopping your dietary supplements.  Taking medicines such as aspirin and ibuprofen. These medicines can thin your blood. Do not take these medicines before your procedure if your health care provider instructs you not to. General instructions    Do not use any tobacco products, such as cigarettes, chewing tobacco, and electronic cigarettes, for as long as possible before your procedure. If you need help quitting, ask your health care provider.  Ask your health care provider if you will have to stay overnight at the hospital or clinic.  If you will not have to stay overnight:  Plan to have someone take you home from the hospital or clinic.  Plan to have someone with you for 24 hours. What happens during the procedure?  A health care provider will put patches on your chest, a cuff around your arm, or a sensor device on your finger. These will be attached to monitors that allow your health care provider to watch your blood pressure, pulse, and oxygen levels to make sure that the anesthetic does not cause any problems.  An IV tube may be inserted into one of your veins. The tube will be used to give you fluids and medicines throughout the procedure as needed.  You may be given a medicine to help you relax (sedative).  You will be asked to sit up or to lie on your side with your knees and your chin bent toward your chest. These positions open up the space between the bones in your back, making it easier to inject the medicine.  The area of your back where the medicine will be injected will be cleaned.  A medicine called a local anesthetic may be injected to numb the area where the spinal or epidural anesthetic will be injected.  A needle will be  inserted between the bones of your back. While this is being done:  Continue to breathe normally.  Stay as still and quiet as you can.  If you feel a tingling shock or pain going down your leg, tell your health care provider but try not to move.  The spinal or epidural anesthetic will be injected.  If you receive an epidural anesthetic and need more than one dose, a tiny, flexible tube (catheter) will be placed where the anesthetic was injected. Additional doses will be given through the catheter. If you need pain medicine after the procedure, the catheter will be kept in place.  If an epidural catheter has not been placed in your injection site or left there, a small bandage (dressing) will be placed over the injection site. The procedure may vary among health care providers and hospitals. What happens after the procedure?  You will need to stay in bed until your health care provider says it safe for you to walk.  Your blood pressure, heart rate, breathing rate, and blood oxygen level will be monitored until the medicines you were given have worn off.  If you have a catheter, it will be removed when it is no longer needed.  Do not drive for 24 hours if you received a sedative.  It is common to have nausea and itching. There are medicines that can help with these side effects. It is also common to have:  Sleepiness.  Vomiting.  Numbness or tingling in your legs.  Trouble urinating. This information is not intended to replace advice given to you by your health care provider. Make sure you discuss any questions you have with your health care provider. Document Released: 11/29/2003 Document Revised: 02/19/2016 Document Reviewed: 12/31/2015 Elsevier Interactive Patient Education  2017 [employer]  Spinal Anesthesia and Epidural Anesthesia, Care After These instructions give you information about caring for yourself after your procedure. Your doctor may also give you more specific  instructions. Call your doctor if you have any problems or questions after your procedure. Follow these instructions at home: For at least 24 hours after the procedure:    Do not:  Do activities where you could fall or get hurt (injured).  Drive.  Use heavy machinery.  Drink alcohol.  Take sleeping pills or medicines that make you sleepy (drowsy).  Make important decisions.  Sign legal documents.  Take care of children on your own.  Rest. Eating and drinking   If you throw up (vomit), drink water, juice, or soup when you can drink without throwing up.  Make sure you do not feel like throwing up (are not nauseous) before you eat.  Follow the diet that is recommended by your doctor. General instructions   Have a responsible adult stay with you until you are awake and alert.  Take over-the-counter and prescription medicines only as told by your doctor.  If you smoke, do not smoke unless an adult is watching.  Keep all follow-up visits as told by your doctor. This is important. Contact a doctor if:  It has been more than one day since your procedure and you feel like throwing up.  It has been more than one day since your procedure and you throw up.  You have a rash. Get help right away if:  You have a fever.  You have a headache that lasts a long time.  You have a very bad headache.  Your vision is blurry.  You see two of a single object (double vision).  You are dizzy or light-headed.  You faint.  Your arms or legs tingle, feel weak, or get numb.  You have trouble breathing.  You cannot pee (urinate). This information is not intended to replace advice given to you by your health care provider. Make sure you discuss any questions you have with your health care provider. Document Released: 12/31/2015 Document Revised: 05/01/2016 Document Reviewed: 12/31/2015 Elsevier Interactive Patient Education  2017 [employer]  General Anesthesia,  Adult General anesthesia is the use of medicines to make a person "go to sleep" (be unconscious) for a medical procedure. General anesthesia is often recommended when a procedure:  Is long.  Requires you to be still or in an unusual position.  Is major and can cause you to lose blood.  Is impossible to do without general anesthesia. The medicines used for general anesthesia are called general anesthetics. In addition to making you sleep, the medicines:  Prevent pain.  Control your blood pressure.  Relax  your muscles. Tell a health care provider about:  Any allergies you have.  All medicines you are taking, including vitamins, herbs, eye drops, creams, and over-the-counter medicines.  Any problems you or family members have had with anesthetic medicines.  Types of anesthetics you have had in the past.  Any bleeding disorders you have.  Any surgeries you have had.  Any medical conditions you have.  Any history of heart or lung conditions, such as heart failure, sleep apnea, or chronic obstructive pulmonary disease (COPD).  Whether you are pregnant or may be pregnant.  Whether you use tobacco, alcohol, marijuana, or street drugs.  Any history of [employer].  Any history of depression or anxiety. What are the risks? Generally, this is a safe procedure. However, problems may occur, including:  Allergic reaction to anesthetics.  Lung and heart problems.  Inhaling food or liquids from your stomach into your lungs (aspiration).  Injury to nerves.  Waking up during your procedure and being unable to move (rare).  Extreme agitation or a state of mental confusion (delirium) when you wake up from the anesthetic.  Air in the bloodstream, which can lead to stroke. These problems are more likely to develop if you are having a major surgery or if you have an advanced medical condition. You can prevent some of these complications by answering all of your health care  provider's questions thoroughly and by following all pre-procedure instructions. General anesthesia can cause side effects, including:  Nausea or vomiting  A sore throat from the breathing tube.  Feeling cold or shivery.  Feeling tired, washed out, or achy.  Sleepiness or drowsiness.  Confusion or agitation. What happens before the procedure? Staying hydrated  Follow instructions from your health care provider about hydration, which may include:  Up to 2 hours before the procedure - you may continue to drink clear liquids, such as water, clear fruit juice, black coffee, and plain tea. Eating and drinking restrictions  Follow instructions from your health care provider about eating and drinking, which may include:  8 hours before the procedure - stop eating heavy meals or foods such as meat, fried foods, or fatty foods.  6 hours before the procedure - stop eating light meals or foods, such as toast or cereal.  6 hours before the procedure - stop drinking milk or drinks that contain milk.  2 hours before the procedure - stop drinking clear liquids. Medicines   Ask your health care provider about:  Changing or stopping your regular medicines. This is especially important if you are taking diabetes medicines or blood thinners.  Taking medicines such as aspirin and ibuprofen. These medicines can thin your blood. Do not take these medicines before your procedure if your health care provider instructs you not to.  Taking new dietary supplements or medicines. Do not take these during the week before your procedure unless your health care provider approves them.  If you are told to take a medicine or to continue taking a medicine on the day of the procedure, take the medicine with sips of water. General instructions    Ask if you will be going home the same day, the following day, or after a longer hospital stay.  Plan to have someone take you home.  Plan to have someone stay  with you for the first 24 hours after you leave the hospital or clinic.  For 3-6 weeks before the procedure, try not to use any tobacco products, such as cigarettes, chewing tobacco,  and e-cigarettes.  You may brush your teeth on the morning of the procedure, but make sure to spit out the toothpaste. What happens during the procedure?  You will be given anesthetics through a mask and through an IV tube in one of your veins.  You may receive medicine to help you relax (sedative).  As soon as you are asleep, a breathing tube may be used to help you breathe.  An anesthesia specialist will stay with you throughout the procedure. He or she will help keep you comfortable and safe by continuing to give you medicines and adjusting the amount of medicine that you get. He or she will also watch your blood pressure, pulse, and oxygen levels to make sure that the anesthetics do not cause any problems.  If a breathing tube was used to help you breathe, it will be removed before you wake up. The procedure may vary among health care providers and hospitals. What happens after the procedure?  You will wake up, often slowly, after the procedure is complete, usually in a recovery area.  Your blood pressure, heart rate, breathing rate, and blood oxygen level will be monitored until the medicines you were given have worn off.  You may be given medicine to help you calm down if you feel anxious or agitated.  If you will be going home the same day, your health care provider may check to make sure you can stand, drink, and urinate.  Your health care providers will treat your pain and side effects before you go home.  Do not drive for 24 hours if you received a sedative.  You may:  Feel nauseous and vomit.  Have a sore throat.  Have mental slowness.  Feel cold or shivery.  Feel sleepy.  Feel tired.  Feel sore or achy, even in parts of your body where you did not have surgery. This information  is not intended to replace advice given to you by your health care provider. Make sure you discuss any questions you have with your health care provider. Document Released: 12/16/2007 Document Revised: 02/19/2016 Document Reviewed: 08/23/2015 Elsevier Interactive Patient Education  2017 [employer] General Anesthesia, Adult, Care After These instructions provide you with information about caring for yourself after your procedure. Your health care provider may also give you more specific instructions. Your treatment has been planned according to current medical practices, but problems sometimes occur. Call your health care provider if you have any problems or questions after your procedure. What can I expect after the procedure? After the procedure, it is common to have:  Vomiting.  A sore throat.  Mental slowness. It is common to feel:  Nauseous.  Cold or shivery.  Sleepy.  Tired.  Sore or achy, even in parts of your body where you did not have surgery. Follow these instructions at home: For at least 24 hours after the procedure:   Do not:  Participate in activities where you could fall or become injured.  Drive.  Use heavy machinery.  Drink alcohol.  Take sleeping pills or medicines that cause drowsiness.  Make important decisions or sign legal documents.  Take care of children on your own.  Rest. Eating and drinking   If you vomit, drink water, juice, or soup when you can drink without vomiting.  Drink enough fluid to keep your urine clear or pale yellow.  Make sure you have little or no nausea before eating solid foods.  Follow the diet recommended by your health care  provider. General instructions   Have a responsible adult stay with you until you are awake and alert.  Return to your normal activities as told by your health care provider. Ask your health care provider what activities are safe for you.  Take over-the-counter and prescription medicines  only as told by your health care provider.  If you smoke, do not smoke without supervision.  Keep all follow-up visits as told by your health care provider. This is important. Contact a health care provider if:  You continue to have nausea or vomiting at home, and medicines are not helpful.  You cannot drink fluids or start eating again.  You cannot urinate after 8-12 hours.  You develop a skin rash.  You have fever.  You have increasing redness at the site of your procedure. Get help right away if:  You have difficulty breathing.  You have chest pain.  You have unexpected bleeding.  You feel that you are having a life-threatening or urgent problem. This information is not intended to replace advice given to you by your health care provider. Make sure you discuss any questions you have with your health care provider. Document Released: 12/15/2000 Document Revised: 02/11/2016 Document Reviewed: 08/23/2015 Elsevier Interactive Patient Education  2017 [employer].

## 2017-01-26 ENCOUNTER — Other Ambulatory Visit (HOSPITAL_COMMUNITY): Payer: BLUE CROSS/BLUE SHIELD

## 2017-01-27 ENCOUNTER — Encounter (HOSPITAL_COMMUNITY): Payer: Self-pay

## 2017-01-27 ENCOUNTER — Encounter (HOSPITAL_COMMUNITY)
Admission: RE | Admit: 2017-01-27 | Discharge: 2017-01-27 | Disposition: A | Discharge status: Home / Self Care | Payer: BLUE CROSS/BLUE SHIELD | Source: Ambulatory Visit | Attending: Orthopedic Surgery | Admitting: Orthopedic Surgery

## 2017-01-27 DIAGNOSIS — M25511 Pain in right shoulder: Secondary | ICD-10-CM | POA: Diagnosis not present

## 2017-01-27 DIAGNOSIS — Z96652 Presence of left artificial knee joint: Secondary | ICD-10-CM | POA: Diagnosis not present

## 2017-01-27 DIAGNOSIS — K649 Unspecified hemorrhoids: Secondary | ICD-10-CM | POA: Diagnosis not present

## 2017-01-27 DIAGNOSIS — Z833 Family history of diabetes mellitus: Secondary | ICD-10-CM | POA: Diagnosis not present

## 2017-01-27 DIAGNOSIS — G4730 Sleep apnea, unspecified: Secondary | ICD-10-CM | POA: Diagnosis not present

## 2017-01-27 DIAGNOSIS — J449 Chronic obstructive pulmonary disease, unspecified: Secondary | ICD-10-CM | POA: Diagnosis not present

## 2017-01-27 DIAGNOSIS — Z808 Family history of malignant neoplasm of other organs or systems: Secondary | ICD-10-CM | POA: Diagnosis not present

## 2017-01-27 DIAGNOSIS — M25512 Pain in left shoulder: Secondary | ICD-10-CM | POA: Diagnosis not present

## 2017-01-27 DIAGNOSIS — M25762 Osteophyte, left knee: Secondary | ICD-10-CM | POA: Diagnosis not present

## 2017-01-27 DIAGNOSIS — M21162 Varus deformity, not elsewhere classified, left knee: Secondary | ICD-10-CM | POA: Diagnosis not present

## 2017-01-27 DIAGNOSIS — Z780 Asymptomatic menopausal state: Secondary | ICD-10-CM | POA: Diagnosis not present

## 2017-01-27 DIAGNOSIS — M549 Dorsalgia, unspecified: Secondary | ICD-10-CM | POA: Diagnosis not present

## 2017-01-27 DIAGNOSIS — M1712 Unilateral primary osteoarthritis, left knee: Secondary | ICD-10-CM | POA: Diagnosis not present

## 2017-01-27 DIAGNOSIS — Z880 Allergy status to penicillin: Secondary | ICD-10-CM | POA: Diagnosis not present

## 2017-01-27 DIAGNOSIS — I10 Essential (primary) hypertension: Secondary | ICD-10-CM | POA: Diagnosis not present

## 2017-01-27 DIAGNOSIS — Z471 Aftercare following joint replacement surgery: Secondary | ICD-10-CM | POA: Diagnosis not present

## 2017-01-27 DIAGNOSIS — M722 Plantar fascial fibromatosis: Secondary | ICD-10-CM | POA: Diagnosis not present

## 2017-01-27 DIAGNOSIS — E669 Obesity, unspecified: Secondary | ICD-10-CM | POA: Diagnosis not present

## 2017-01-27 DIAGNOSIS — Z87412 Personal history of vulvar dysplasia: Secondary | ICD-10-CM | POA: Diagnosis not present

## 2017-01-27 DIAGNOSIS — Z8269 Family history of other diseases of the musculoskeletal system and connective tissue: Secondary | ICD-10-CM | POA: Diagnosis not present

## 2017-01-27 DIAGNOSIS — E039 Hypothyroidism, unspecified: Secondary | ICD-10-CM | POA: Diagnosis not present

## 2017-01-27 DIAGNOSIS — Z8249 Family history of ischemic heart disease and other diseases of the circulatory system: Secondary | ICD-10-CM | POA: Diagnosis not present

## 2017-01-27 LAB — CBC WITH DIFFERENTIAL/PLATELET
Basophils Absolute: 0 10*3/uL (ref 0.0–0.1)
Basophils Relative: 0 %
EOS PCT: 2 %
Eosinophils Absolute: 0.2 10*3/uL (ref 0.0–0.7)
HEMATOCRIT: 41.2 % (ref 36.0–46.0)
Hemoglobin: 13.8 g/dL (ref 12.0–15.0)
LYMPHS ABS: 2 10*3/uL (ref 0.7–4.0)
Lymphocytes Relative: 20 %
MCH: 29.4 pg (ref 26.0–34.0)
MCHC: 33.5 g/dL (ref 30.0–36.0)
MCV: 87.7 fL (ref 78.0–100.0)
MONO ABS: 0.6 10*3/uL (ref 0.1–1.0)
MONOS PCT: 6 %
NEUTROS ABS: 7 10*3/uL (ref 1.7–7.7)
Neutrophils Relative %: 72 %
Platelets: 207 10*3/uL (ref 150–400)
RBC: 4.7 MIL/uL (ref 3.87–5.11)
RDW: 13.4 % (ref 11.5–15.5)
WBC: 9.9 10*3/uL (ref 4.0–10.5)

## 2017-01-27 LAB — SURGICAL PCR SCREEN
MRSA, PCR: NEGATIVE
STAPHYLOCOCCUS AUREUS: POSITIVE — AB

## 2017-01-27 LAB — BASIC METABOLIC PANEL
ANION GAP: 9 (ref 5–15)
BUN: 15 mg/dL (ref 6–20)
CHLORIDE: 98 mmol/L — AB (ref 101–111)
CO2: 31 mmol/L (ref 22–32)
Calcium: 9.3 mg/dL (ref 8.9–10.3)
Creatinine, Ser: 0.69 mg/dL (ref 0.44–1.00)
GFR calc Af Amer: >60 mL/min (ref >60)
GFR calc non Af Amer: >60 mL/min (ref >60)
GLUCOSE: 130 mg/dL — AB (ref 65–99)
POTASSIUM: 3.7 mmol/L (ref 3.5–5.1)
Sodium: 138 mmol/L (ref 135–145)

## 2017-01-27 LAB — PROTIME-INR
INR: 0.89
Prothrombin Time: 12 seconds (ref 11.4–15.2)

## 2017-01-27 LAB — APTT: APTT: 30 s (ref 24–36)

## 2017-01-27 LAB — HCG, SERUM, QUALITATIVE: PREG SERUM: NEGATIVE

## 2017-01-27 LAB — PREPARE RBC (CROSSMATCH)

## 2017-01-28 MED ORDER — MUPIROCIN 2 % EX OINT
TOPICAL_OINTMENT | CUTANEOUS | Status: AC
  Filled 2017-01-28: qty 22

## 2017-01-28 MED ORDER — TRANEXAMIC ACID 1000 MG/10ML IV SOLN
1000.0000 mg | INTRAVENOUS | Status: AC
  Administered 2017-01-29: 1000 mg via INTRAVENOUS
  Filled 2017-01-28: qty 10

## 2017-01-28 NOTE — H&P (Signed)
TOTAL KNEE ADMISSION H&P  Patient is being admitted for left total knee arthroplasty.  Subjective:  Chief Complaint:left knee pain.  HPI: [name], 51 y.o. female, has a history of pain and functional disability in the left knee due to arthritis and has failed non-surgical conservative treatments for greater than 12 weeks to includeNSAID's and/or analgesics, corticosteriod injections, weight reduction as appropriate and activity modification.  Onset of symptoms was gradual, starting 3 years ago with gradually worsening course since that time.  knee(s).  Patient currently rates pain in the left knee(s) at 7 out of 10 with activity. Patient has night pain, worsening of pain with activity and weight bearing, pain that interferes with activities of daily living, pain with passive range of motion, crepitus and joint swelling.  Patient has evidence of subchondral cysts, subchondral sclerosis, periarticular osteophytes and joint space narrowing by imaging studies. This patient has had no other procedures. There is no active infection.  Patient Active Problem List   Diagnosis Date Noted  . Special screening for malignant neoplasms, colon   . Hemorrhoids 10/30/2015  . Fecal occult blood test positive 10/30/2015  . Postmenopausal 10/30/2015  . Large breasts 10/30/2015  . Bilateral shoulder pain 10/30/2015  . Back pain 10/30/2015  . Medial meniscus tear   . Lateral meniscus tear, current   . Erosive lichen planus of vulva 08/28/2014  . 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 Right hip flexor tightness 12/06/2013  . Plantar fasciitis 12/06/2013   Past Medical History:  Diagnosis Date  . Arthritis   . Back pain 10/30/2015  . Bilateral shoulder pain 10/30/2015  . COPD (chronic obstructive pulmonary disease) (HCC)   . Fecal occult blood test positive 10/30/2015  . Hemorrhoids 10/30/2015  . Hypertension   . Hypothyroidism   . Large breasts 10/30/2015   43 DD  . Postmenopausal 10/30/2015  . Sleep apnea    pt  DX but did not want to use CPAP.  Marland Kitchen Thyroid disease     Past Surgical History:  Procedure Laterality Date  . COLONOSCOPY N/A 11/12/2015   Procedure: COLONOSCOPY;  Surgeon: West Bali, MD;  Location: [facility];  Service: Endoscopy;  Laterality: N/A;  8:30 AM  . KNEE ARTHROSCOPY WITH MEDIAL MENISECTOMY Left 05/04/2015   Procedure: KNEE ARTHROSCOPY WITH MEDIAL AND LATERAL MENISECTOMY;  Surgeon: Vickki Hearing, MD;  Location: [facility];  Service: Orthopedics;  Laterality: Left;  . NASAL SINUS SURGERY      No prescriptions prior to admission.   Allergies  Allergen Reactions  . Penicillins Other (See Comments)    Yeast infection Has patient had a PCN reaction causing immediate rash, facial/tongue/throat swelling, SOB or lightheadedness with hypotension: No Has patient had a PCN reaction causing severe rash involving mucus membranes or skin necrosis: No Has patient had a PCN reaction that required hospitalization No Has patient had a PCN reaction occurring within the last 10 years: No If all of the above answers are "NO", then may proceed with Cephalosporin use.     Social History  Substance Use Topics  . Smoking status: Never Smoker  . Smokeless tobacco: Never Used  . Alcohol use No    Family History  Problem Relation Age of Onset  . Cancer Mother   . Cancer Father   . Diabetes Brother   . Heart disease Brother   . Heart attack Brother   . Cancer Brother     melanoma  . Lupus Maternal Grandmother   . Heart disease Maternal  Grandmother   . Heart disease Maternal Grandfather   . Hypertension Maternal Grandfather      Review of Systems  HENT: Negative.        Dental work complete  Respiratory: Negative for cough.   Cardiovascular: Negative for chest pain.  Skin: Negative for rash.  All other systems reviewed and are negative.   Objective:  Physical Exam  Constitutional: She is oriented to person, place, and time. She appears well-nourished.  Eyes: Right eye  exhibits no discharge. Left eye exhibits no discharge. No scleral icterus.  Neck: Neck supple. No JVD present. No tracheal deviation present.  Cardiovascular: Intact distal pulses.   Respiratory: Effort normal. No stridor.  GI: Soft. She exhibits no distension.  Musculoskeletal:       Left knee: She exhibits no effusion.  Neurological: She is alert and oriented to person, place, and time. She has normal reflexes. She exhibits normal muscle tone. Coordination normal.  Skin: Skin is warm and dry. No rash noted. No erythema. No pallor.  Psychiatric: She has a normal mood and affect. Her behavior is normal. Thought content normal.   Her upper extremities and right lower extrem look normal, rom is full, strength is normal and there is no instability   Left Knee Exam   Tenderness  The patient is experiencing tenderness in the medial joint line.  Range of Motion  Extension: -5  Flexion: 110   Muscle Strength   The patient has normal left knee strength.  Tests  McMurray:  Medial - negative Lateral - negative Drawer:       Anterior - negative     Posterior - negative Varus: negative Valgus: negative  Other  Erythema: present Scars: absent Sensation: normal Pulse: present Swelling: none Effusion: no effusion present     Vital signs in last 24 hours:    Labs:   Estimated body mass index is 39.5 kg/m as calculated from the following:   Height as of 01/27/17: 5\' 3"  (1.6 m).   Weight as of 01/27/17: 223 lb (101.2 kg).   Imaging Review Plain radiographs demonstrate severe degenerative joint disease of the left knee(s). The overall alignment issignificant varus. The bone quality appears to be good for age and reported activity level.  Assessment/Plan:  End stage arthritis, left knee   The patient history, physical examination, clinical judgment of the provider and imaging studies are consistent with end stage degenerative joint disease of the left knee(s) and total knee  arthroplasty is deemed medically necessary. The treatment options including medical management, injection therapy arthroscopy and arthroplasty were discussed at length. The risks and benefits of total knee arthroplasty were presented and reviewed. The risks due to aseptic loosening, infection, stiffness, patella tracking problems, thromboembolic complications and other imponderables were discussed. The patient acknowledged the explanation, agreed to proceed with the plan and consent was signed. Patient is being admitted for inpatient treatment for surgery, pain control, PT, OT, prophylactic antibiotics, VTE prophylaxis, progressive ambulation and ADL's and discharge planning. The patient is planning to be discharged home with home health services

## 2017-01-29 ENCOUNTER — Inpatient Hospital Stay (HOSPITAL_COMMUNITY): Payer: BLUE CROSS/BLUE SHIELD | Admitting: Anesthesiology

## 2017-01-29 ENCOUNTER — Inpatient Hospital Stay (HOSPITAL_COMMUNITY)
Admission: RE | Admit: 2017-01-29 | Discharge: 2017-01-31 | DRG: 470 | Disposition: A | Discharge status: Home Health | Payer: BLUE CROSS/BLUE SHIELD | Source: Ambulatory Visit | Attending: Orthopedic Surgery | Admitting: Orthopedic Surgery

## 2017-01-29 ENCOUNTER — Encounter (HOSPITAL_COMMUNITY)
Admission: RE | Disposition: A | Discharge status: Home Health | Payer: Self-pay | Source: Ambulatory Visit | Attending: Orthopedic Surgery

## 2017-01-29 ENCOUNTER — Inpatient Hospital Stay (HOSPITAL_COMMUNITY): Payer: BLUE CROSS/BLUE SHIELD

## 2017-01-29 ENCOUNTER — Encounter (HOSPITAL_COMMUNITY): Payer: Self-pay | Admitting: *Deleted

## 2017-01-29 DIAGNOSIS — Z880 Allergy status to penicillin: Secondary | ICD-10-CM | POA: Diagnosis not present

## 2017-01-29 DIAGNOSIS — J449 Chronic obstructive pulmonary disease, unspecified: Secondary | ICD-10-CM | POA: Diagnosis present

## 2017-01-29 DIAGNOSIS — Z96652 Presence of left artificial knee joint: Secondary | ICD-10-CM | POA: Diagnosis not present

## 2017-01-29 DIAGNOSIS — Z471 Aftercare following joint replacement surgery: Secondary | ICD-10-CM | POA: Diagnosis not present

## 2017-01-29 DIAGNOSIS — M25512 Pain in left shoulder: Secondary | ICD-10-CM | POA: Diagnosis present

## 2017-01-29 DIAGNOSIS — Z808 Family history of malignant neoplasm of other organs or systems: Secondary | ICD-10-CM | POA: Diagnosis not present

## 2017-01-29 DIAGNOSIS — K649 Unspecified hemorrhoids: Secondary | ICD-10-CM | POA: Diagnosis present

## 2017-01-29 DIAGNOSIS — M1712 Unilateral primary osteoarthritis, left knee: Principal | ICD-10-CM | POA: Diagnosis present

## 2017-01-29 DIAGNOSIS — E669 Obesity, unspecified: Secondary | ICD-10-CM | POA: Diagnosis present

## 2017-01-29 DIAGNOSIS — M25762 Osteophyte, left knee: Secondary | ICD-10-CM | POA: Diagnosis present

## 2017-01-29 DIAGNOSIS — M549 Dorsalgia, unspecified: Secondary | ICD-10-CM | POA: Diagnosis present

## 2017-01-29 DIAGNOSIS — Z780 Asymptomatic menopausal state: Secondary | ICD-10-CM | POA: Diagnosis not present

## 2017-01-29 DIAGNOSIS — Z8269 Family history of other diseases of the musculoskeletal system and connective tissue: Secondary | ICD-10-CM

## 2017-01-29 DIAGNOSIS — M21162 Varus deformity, not elsewhere classified, left knee: Secondary | ICD-10-CM | POA: Diagnosis present

## 2017-01-29 DIAGNOSIS — Z833 Family history of diabetes mellitus: Secondary | ICD-10-CM

## 2017-01-29 DIAGNOSIS — E039 Hypothyroidism, unspecified: Secondary | ICD-10-CM | POA: Diagnosis present

## 2017-01-29 DIAGNOSIS — M25511 Pain in right shoulder: Secondary | ICD-10-CM | POA: Diagnosis present

## 2017-01-29 DIAGNOSIS — G4730 Sleep apnea, unspecified: Secondary | ICD-10-CM | POA: Diagnosis present

## 2017-01-29 DIAGNOSIS — Z87412 Personal history of vulvar dysplasia: Secondary | ICD-10-CM | POA: Diagnosis not present

## 2017-01-29 DIAGNOSIS — Z8249 Family history of ischemic heart disease and other diseases of the circulatory system: Secondary | ICD-10-CM

## 2017-01-29 DIAGNOSIS — M722 Plantar fascial fibromatosis: Secondary | ICD-10-CM | POA: Diagnosis present

## 2017-01-29 DIAGNOSIS — I10 Essential (primary) hypertension: Secondary | ICD-10-CM | POA: Diagnosis present

## 2017-01-29 HISTORY — PX: TOTAL KNEE ARTHROPLASTY: SHX125

## 2017-01-29 SURGERY — ARTHROPLASTY, KNEE, TOTAL
Anesthesia: Spinal | Site: Knee | Laterality: Left

## 2017-01-29 MED ORDER — PROPOFOL 500 MG/50ML IV EMUL
INTRAVENOUS | Status: DC | PRN
  Administered 2017-01-29: 50 ug/kg/min via INTRAVENOUS
  Administered 2017-01-29: 08:00:00 via INTRAVENOUS

## 2017-01-29 MED ORDER — PHENOL 1.4 % MT LIQD: 1.0000 | OROMUCOSAL | Status: DC | PRN

## 2017-01-29 MED ORDER — VANCOMYCIN HCL IN DEXTROSE 1-5 GM/200ML-% IV SOLN
1000.0000 mg | Freq: Two times a day (BID) | INTRAVENOUS | Status: AC
  Administered 2017-01-29: 1000 mg via INTRAVENOUS
  Filled 2017-01-29: qty 200

## 2017-01-29 MED ORDER — HYDROCODONE-ACETAMINOPHEN 7.5-325 MG PO TABS
1.0000 | ORAL_TABLET | Freq: Four times a day (QID) | ORAL | Status: DC
  Administered 2017-01-29 – 2017-01-31 (×8): 1 via ORAL
  Filled 2017-01-29 (×8): qty 1

## 2017-01-29 MED ORDER — ONDANSETRON HCL 4 MG/2ML IJ SOLN
INTRAMUSCULAR | Status: AC
  Filled 2017-01-29: qty 2

## 2017-01-29 MED ORDER — BUPIVACAINE-EPINEPHRINE (PF) 0.5% -1:200000 IJ SOLN
INTRAMUSCULAR | Status: AC
  Filled 2017-01-29: qty 30

## 2017-01-29 MED ORDER — BUPIVACAINE IN DEXTROSE 0.75-8.25 % IT SOLN
INTRATHECAL | Status: AC
  Filled 2017-01-29: qty 2

## 2017-01-29 MED ORDER — METHOCARBAMOL 1000 MG/10ML IJ SOLN
500.0000 mg | Freq: Four times a day (QID) | INTRAVENOUS | Status: DC | PRN
  Filled 2017-01-29 (×2): qty 5

## 2017-01-29 MED ORDER — PRAVASTATIN SODIUM 10 MG PO TABS
20.0000 mg | ORAL_TABLET | Freq: Every evening | ORAL | Status: DC
  Administered 2017-01-29 – 2017-01-30 (×3): 20 mg via ORAL
  Filled 2017-01-29 (×2): qty 2

## 2017-01-29 MED ORDER — LEVOTHYROXINE SODIUM 25 MCG PO TABS
25.0000 ug | ORAL_TABLET | Freq: Every day | ORAL | Status: DC
  Administered 2017-01-30 – 2017-01-31 (×2): 25 ug via ORAL
  Filled 2017-01-29 (×2): qty 1

## 2017-01-29 MED ORDER — MIDAZOLAM HCL 2 MG/2ML IJ SOLN
INTRAMUSCULAR | Status: AC
  Filled 2017-01-29: qty 2

## 2017-01-29 MED ORDER — MIDAZOLAM HCL 5 MG/5ML IJ SOLN
INTRAMUSCULAR | Status: DC | PRN
  Administered 2017-01-29 (×2): 1 mg via INTRAVENOUS

## 2017-01-29 MED ORDER — SODIUM CHLORIDE 0.9 % IJ SOLN
INTRAMUSCULAR | Status: AC
  Filled 2017-01-29: qty 40

## 2017-01-29 MED ORDER — HYDROMORPHONE HCL 1 MG/ML IJ SOLN: 0.2500 mg | INTRAMUSCULAR | Status: DC | PRN

## 2017-01-29 MED ORDER — FENTANYL CITRATE (PF) 100 MCG/2ML IJ SOLN
INTRAMUSCULAR | Status: AC
  Filled 2017-01-29: qty 2

## 2017-01-29 MED ORDER — MENTHOL 3 MG MT LOZG: 1.0000 | LOZENGE | OROMUCOSAL | Status: DC | PRN

## 2017-01-29 MED ORDER — BISACODYL 5 MG PO TBEC
5.0000 mg | DELAYED_RELEASE_TABLET | Freq: Every day | ORAL | Status: DC | PRN
Start: 2017-01-29 — End: 2017-01-31

## 2017-01-29 MED ORDER — CHLORHEXIDINE GLUCONATE 4 % EX LIQD: 60.0000 mL | Freq: Once | CUTANEOUS | Status: DC

## 2017-01-29 MED ORDER — IPRATROPIUM-ALBUTEROL 0.5-2.5 (3) MG/3ML IN SOLN
3.0000 mL | Freq: Once | RESPIRATORY_TRACT | Status: AC
  Administered 2017-01-29: 3 mL via RESPIRATORY_TRACT

## 2017-01-29 MED ORDER — PHENYLEPHRINE 40 MCG/ML (10ML) SYRINGE FOR IV PUSH (FOR BLOOD PRESSURE SUPPORT)
PREFILLED_SYRINGE | INTRAVENOUS | Status: AC
  Filled 2017-01-29: qty 10

## 2017-01-29 MED ORDER — METOCLOPRAMIDE HCL 5 MG/ML IJ SOLN: 5.0000 mg | Freq: Three times a day (TID) | INTRAMUSCULAR | Status: DC | PRN

## 2017-01-29 MED ORDER — SODIUM CHLORIDE 0.9 % IJ SOLN
INTRAMUSCULAR | Status: AC
  Filled 2017-01-29: qty 10

## 2017-01-29 MED ORDER — ACETAMINOPHEN 325 MG PO TABS
650.0000 mg | ORAL_TABLET | Freq: Four times a day (QID) | ORAL | Status: DC | PRN
Start: 2017-01-29 — End: 2017-01-31

## 2017-01-29 MED ORDER — CELECOXIB 400 MG PO CAPS
ORAL_CAPSULE | ORAL | Status: AC
Start: 2017-01-29 — End: 2017-01-29
  Filled 2017-01-29: qty 1

## 2017-01-29 MED ORDER — MOMETASONE FURO-FORMOTEROL FUM 100-5 MCG/ACT IN AERO
2.0000 | INHALATION_SPRAY | Freq: Two times a day (BID) | RESPIRATORY_TRACT | Status: DC
  Administered 2017-01-29 – 2017-01-31 (×5): 2 via RESPIRATORY_TRACT
  Filled 2017-01-29: qty 8.8

## 2017-01-29 MED ORDER — SENNOSIDES-DOCUSATE SODIUM 8.6-50 MG PO TABS: 1.0000 | ORAL_TABLET | Freq: Every evening | ORAL | Status: DC | PRN

## 2017-01-29 MED ORDER — VANCOMYCIN HCL IN DEXTROSE 1-5 GM/200ML-% IV SOLN
1000.0000 mg | INTRAVENOUS | Status: AC
  Administered 2017-01-29: 1000 mg via INTRAVENOUS
  Filled 2017-01-29: qty 200

## 2017-01-29 MED ORDER — BUPIVACAINE LIPOSOME 1.3 % IJ SUSP
20.0000 mL | Freq: Once | INTRAMUSCULAR | Status: DC
  Filled 2017-01-29: qty 20

## 2017-01-29 MED ORDER — DEXTROSE 5 % IV SOLN
INTRAVENOUS | Status: DC | PRN
  Administered 2017-01-29: 07:00:00 via INTRAVENOUS

## 2017-01-29 MED ORDER — CELECOXIB 100 MG PO CAPS
200.0000 mg | ORAL_CAPSULE | Freq: Two times a day (BID) | ORAL | Status: DC
  Administered 2017-01-29 – 2017-01-31 (×4): 200 mg via ORAL
  Filled 2017-01-29 (×4): qty 2

## 2017-01-29 MED ORDER — ONDANSETRON HCL 4 MG/2ML IJ SOLN: 4.0000 mg | Freq: Four times a day (QID) | INTRAMUSCULAR | Status: DC | PRN

## 2017-01-29 MED ORDER — VITAMIN B-12 1000 MCG PO TABS
500.0000 ug | ORAL_TABLET | Freq: Every day | ORAL | Status: DC
  Administered 2017-01-30 – 2017-01-31 (×2): 500 ug via ORAL
  Filled 2017-01-29 (×2): qty 1

## 2017-01-29 MED ORDER — MAGNESIUM CITRATE PO SOLN: 1.0000 | Freq: Once | ORAL | Status: DC | PRN

## 2017-01-29 MED ORDER — DOCUSATE SODIUM 100 MG PO CAPS
100.0000 mg | ORAL_CAPSULE | Freq: Two times a day (BID) | ORAL | Status: DC
  Administered 2017-01-29 – 2017-01-31 (×4): 100 mg via ORAL
  Filled 2017-01-29 (×4): qty 1

## 2017-01-29 MED ORDER — ONDANSETRON HCL 4 MG/2ML IJ SOLN
4.0000 mg | Freq: Once | INTRAMUSCULAR | Status: AC
  Administered 2017-01-29: 4 mg via INTRAVENOUS

## 2017-01-29 MED ORDER — METHOCARBAMOL 1000 MG/10ML IJ SOLN
500.0000 mg | Freq: Once | INTRAVENOUS | Status: AC
  Administered 2017-01-29: 500 mg via INTRAVENOUS
  Filled 2017-01-29: qty 550

## 2017-01-29 MED ORDER — SODIUM CHLORIDE 0.9 % IV SOLN
INTRAVENOUS | Status: DC | PRN
  Administered 2017-01-29: 60 mL

## 2017-01-29 MED ORDER — OXYCODONE HCL 5 MG PO TABS
5.0000 mg | ORAL_TABLET | Freq: Once | ORAL | Status: AC
  Administered 2017-01-29: 5 mg via ORAL

## 2017-01-29 MED ORDER — TURMERIC 500 MG PO CAPS: ORAL_CAPSULE | Freq: Every day | ORAL | Status: DC

## 2017-01-29 MED ORDER — HYDROCHLOROTHIAZIDE 25 MG PO TABS
25.0000 mg | ORAL_TABLET | Freq: Every day | ORAL | Status: DC
  Administered 2017-01-29 – 2017-01-31 (×3): 25 mg via ORAL
  Filled 2017-01-29 (×3): qty 1

## 2017-01-29 MED ORDER — MIDAZOLAM HCL 2 MG/2ML IJ SOLN
1.0000 mg | INTRAMUSCULAR | Status: AC | PRN
  Administered 2017-01-29 (×2): 2 mg via INTRAVENOUS

## 2017-01-29 MED ORDER — ONDANSETRON HCL 4 MG PO TABS: 4.0000 mg | ORAL_TABLET | Freq: Four times a day (QID) | ORAL | Status: DC | PRN

## 2017-01-29 MED ORDER — CLOBETASOL PROPIONATE 0.05 % EX CREA: 1.0000 "application " | TOPICAL_CREAM | Freq: Two times a day (BID) | CUTANEOUS | Status: DC | PRN

## 2017-01-29 MED ORDER — FENTANYL CITRATE (PF) 100 MCG/2ML IJ SOLN
INTRAMUSCULAR | Status: AC
Start: 2017-01-29 — End: 2017-01-29
  Filled 2017-01-29: qty 2

## 2017-01-29 MED ORDER — FENTANYL CITRATE (PF) 100 MCG/2ML IJ SOLN
25.0000 ug | INTRAMUSCULAR | Status: AC | PRN
  Administered 2017-01-29 (×2): 25 ug via INTRAVENOUS

## 2017-01-29 MED ORDER — SUCCINYLCHOLINE CHLORIDE 20 MG/ML IJ SOLN
INTRAMUSCULAR | Status: AC
  Filled 2017-01-29: qty 1

## 2017-01-29 MED ORDER — METOCLOPRAMIDE HCL 10 MG PO TABS: 5.0000 mg | ORAL_TABLET | Freq: Three times a day (TID) | ORAL | Status: DC | PRN

## 2017-01-29 MED ORDER — OXYCODONE HCL 5 MG PO TABS
ORAL_TABLET | ORAL | Status: AC
  Filled 2017-01-29: qty 1

## 2017-01-29 MED ORDER — LACTATED RINGERS IV SOLN
INTRAVENOUS | Status: DC
  Administered 2017-01-29: 08:00:00 via INTRAVENOUS
  Administered 2017-01-29: 1000 mL via INTRAVENOUS

## 2017-01-29 MED ORDER — DEXAMETHASONE SODIUM PHOSPHATE 10 MG/ML IJ SOLN
10.0000 mg | Freq: Once | INTRAMUSCULAR | Status: AC
  Administered 2017-01-30: 10 mg via INTRAVENOUS
  Filled 2017-01-29: qty 1

## 2017-01-29 MED ORDER — ASPIRIN EC 325 MG PO TBEC
325.0000 mg | DELAYED_RELEASE_TABLET | Freq: Every day | ORAL | Status: DC
  Administered 2017-01-30 – 2017-01-31 (×2): 325 mg via ORAL
  Filled 2017-01-29 (×2): qty 1

## 2017-01-29 MED ORDER — OXYCODONE HCL 5 MG PO TABS: 5.0000 mg | ORAL_TABLET | ORAL | Status: DC | PRN

## 2017-01-29 MED ORDER — BUPIVACAINE LIPOSOME 1.3 % IJ SUSP
INTRAMUSCULAR | Status: AC
  Filled 2017-01-29: qty 20

## 2017-01-29 MED ORDER — 0.9 % SODIUM CHLORIDE (POUR BTL) OPTIME
TOPICAL | Status: DC | PRN
  Administered 2017-01-29: 1000 mL

## 2017-01-29 MED ORDER — EPHEDRINE SULFATE 50 MG/ML IJ SOLN
INTRAMUSCULAR | Status: AC
  Filled 2017-01-29: qty 1

## 2017-01-29 MED ORDER — SODIUM CHLORIDE 0.9 % IV SOLN
INTRAVENOUS | Status: DC
  Administered 2017-01-29 – 2017-01-30 (×5): via INTRAVENOUS

## 2017-01-29 MED ORDER — BUPIVACAINE IN DEXTROSE 0.75-8.25 % IT SOLN
INTRATHECAL | Status: DC | PRN
  Administered 2017-01-29: 15 mg via INTRATHECAL

## 2017-01-29 MED ORDER — KETOROLAC TROMETHAMINE 15 MG/ML IJ SOLN
7.5000 mg | Freq: Four times a day (QID) | INTRAMUSCULAR | Status: AC
  Administered 2017-01-29 – 2017-01-30 (×4): 7.5 mg via INTRAVENOUS
  Filled 2017-01-29 (×4): qty 1

## 2017-01-29 MED ORDER — GABAPENTIN 300 MG PO CAPS
300.0000 mg | ORAL_CAPSULE | Freq: Three times a day (TID) | ORAL | Status: DC
  Administered 2017-01-29 – 2017-01-31 (×6): 300 mg via ORAL
  Filled 2017-01-29 (×6): qty 1

## 2017-01-29 MED ORDER — CELECOXIB 400 MG PO CAPS
400.0000 mg | ORAL_CAPSULE | Freq: Once | ORAL | Status: AC
  Administered 2017-01-29: 400 mg via ORAL

## 2017-01-29 MED ORDER — METHOCARBAMOL 500 MG PO TABS: 500.0000 mg | ORAL_TABLET | Freq: Four times a day (QID) | ORAL | Status: DC | PRN

## 2017-01-29 MED ORDER — PROPOFOL 10 MG/ML IV BOLUS
INTRAVENOUS | Status: AC
  Filled 2017-01-29: qty 40

## 2017-01-29 MED ORDER — HYDROMORPHONE HCL 1 MG/ML IJ SOLN
0.5000 mg | INTRAMUSCULAR | Status: DC | PRN
  Administered 2017-01-29 – 2017-01-30 (×4): 0.5 mg via INTRAVENOUS
  Filled 2017-01-29 (×4): qty 0.5

## 2017-01-29 MED ORDER — SODIUM CHLORIDE 0.9 % IR SOLN
Status: DC | PRN
  Administered 2017-01-29: 3000 mL

## 2017-01-29 MED ORDER — ACETAMINOPHEN 650 MG RE SUPP: 650.0000 mg | Freq: Four times a day (QID) | RECTAL | Status: DC | PRN

## 2017-01-29 MED ORDER — BUPIVACAINE-EPINEPHRINE (PF) 0.5% -1:200000 IJ SOLN
INTRAMUSCULAR | Status: DC | PRN
  Administered 2017-01-29: 30 mL via PERINEURAL

## 2017-01-29 MED ORDER — FENTANYL CITRATE (PF) 100 MCG/2ML IJ SOLN
INTRAMUSCULAR | Status: DC | PRN
Start: 2017-01-29 — End: 2017-01-29
  Administered 2017-01-29: 25 ug via INTRATHECAL

## 2017-01-29 MED ORDER — POTASSIUM CHLORIDE CRYS ER 20 MEQ PO TBCR
40.0000 meq | EXTENDED_RELEASE_TABLET | Freq: Three times a day (TID) | ORAL | Status: DC
  Administered 2017-01-29 – 2017-01-31 (×6): 40 meq via ORAL
  Filled 2017-01-29 (×6): qty 2

## 2017-01-29 MED ORDER — PREGABALIN 50 MG PO CAPS
ORAL_CAPSULE | ORAL | Status: AC
  Filled 2017-01-29: qty 1

## 2017-01-29 MED ORDER — ALUM & MAG HYDROXIDE-SIMETH 200-200-20 MG/5ML PO SUSP: 30.0000 mL | ORAL | Status: DC | PRN

## 2017-01-29 MED ORDER — PROPOFOL 10 MG/ML IV BOLUS
INTRAVENOUS | Status: DC | PRN
  Administered 2017-01-29: 20 mg via INTRAVENOUS

## 2017-01-29 MED ORDER — PREGABALIN 50 MG PO CAPS
50.0000 mg | ORAL_CAPSULE | Freq: Once | ORAL | Status: AC
  Administered 2017-01-29: 50 mg via ORAL

## 2017-01-29 MED ORDER — IPRATROPIUM-ALBUTEROL 0.5-2.5 (3) MG/3ML IN SOLN
RESPIRATORY_TRACT | Status: AC
  Filled 2017-01-29: qty 3

## 2017-01-29 MED ORDER — HYDROCODONE-ACETAMINOPHEN 5-325 MG PO TABS
1.0000 | ORAL_TABLET | Freq: Three times a day (TID) | ORAL | Status: DC | PRN
  Administered 2017-01-29 – 2017-01-30 (×2): 1 via ORAL
  Filled 2017-01-29 (×3): qty 1

## 2017-01-29 MED ORDER — DIPHENHYDRAMINE HCL 12.5 MG/5ML PO ELIX: 12.5000 mg | ORAL_SOLUTION | ORAL | Status: DC | PRN

## 2017-01-29 SURGICAL SUPPLY — 69 items
BAG HAMPER (MISCELLANEOUS) ×2 IMPLANT
BANDAGE ESMARK 6X9 LF (GAUZE/BANDAGES/DRESSINGS) ×1 IMPLANT
BIT DRILL 3.2X128 (BIT) IMPLANT
BLADE HEX COATED 2.75 (ELECTRODE) ×2 IMPLANT
BLADE SAGITTAL 25.0X1.27X90 (BLADE) ×2 IMPLANT
BNDG CMPR 9X6 STRL LF SNTH (GAUZE/BANDAGES/DRESSINGS) ×1
BNDG ESMARK 6X9 LF (GAUZE/BANDAGES/DRESSINGS) ×2
BOWL SMART MIX CTS (DISPOSABLE) IMPLANT
CAP KNEE TOTAL 3 SIGMA ×1 IMPLANT
CEMENT HV SMART SET (Cement) ×4 IMPLANT
CLOTH BEACON ORANGE TIMEOUT ST (SAFETY) ×2 IMPLANT
COOLER CRYO CUFF IC AND MOTOR (MISCELLANEOUS) ×2 IMPLANT
COVER LIGHT HANDLE STERIS (MISCELLANEOUS) ×4 IMPLANT
CUFF CRYO KNEE LG 20X31 COOLER (ORTHOPEDIC SUPPLIES) ×1 IMPLANT
CUFF CRYO KNEE18X23 MED (MISCELLANEOUS) IMPLANT
CUFF TOURNIQUET SINGLE 34IN LL (TOURNIQUET CUFF) ×1 IMPLANT
DECANTER SPIKE VIAL GLASS SM (MISCELLANEOUS) ×2 IMPLANT
DRAPE BACK TABLE (DRAPES) ×2 IMPLANT
DRAPE EXTREMITY T 121X128X90 (DRAPE) ×2 IMPLANT
DRESSING AQUACEL AG ADV 3.5X12 (MISCELLANEOUS) ×1 IMPLANT
DRSG AQUACEL AG ADV 3.5X12 (MISCELLANEOUS) ×2
DRSG MEPILEX BORDER 4X12 (GAUZE/BANDAGES/DRESSINGS) ×2 IMPLANT
DURAPREP 26ML APPLICATOR (WOUND CARE) ×4 IMPLANT
ELECT REM PT RETURN 9FT ADLT (ELECTROSURGICAL) ×2
ELECTRODE REM PT RTRN 9FT ADLT (ELECTROSURGICAL) ×1 IMPLANT
EVACUATOR 3/16  PVC DRAIN (DRAIN) ×1
EVACUATOR 3/16 PVC DRAIN (DRAIN) ×1 IMPLANT
GLOVE BIO SURGEON STRL SZ7 (GLOVE) ×4 IMPLANT
GLOVE BIOGEL PI IND STRL 7.0 (GLOVE) ×2 IMPLANT
GLOVE BIOGEL PI INDICATOR 7.0 (GLOVE) ×2
GLOVE SKINSENSE NS SZ8.0 LF (GLOVE) ×2
GLOVE SKINSENSE STRL SZ8.0 LF (GLOVE) ×2 IMPLANT
GLOVE SS N UNI LF 8.5 STRL (GLOVE) ×2 IMPLANT
GOWN STRL REUS W/ TWL LRG LVL3 (GOWN DISPOSABLE) ×1 IMPLANT
GOWN STRL REUS W/TWL LRG LVL3 (GOWN DISPOSABLE) ×6 IMPLANT
GOWN STRL REUS W/TWL XL LVL3 (GOWN DISPOSABLE) ×2 IMPLANT
HANDPIECE INTERPULSE COAX TIP (DISPOSABLE) ×2
HOOD W/PEELAWAY (MISCELLANEOUS) ×8 IMPLANT
INST SET MAJOR BONE (KITS) ×2 IMPLANT
IV NS IRRIG 3000ML ARTHROMATIC (IV SOLUTION) ×2 IMPLANT
KIT BLADEGUARD II DBL (SET/KITS/TRAYS/PACK) ×2 IMPLANT
KIT ROOM TURNOVER APOR (KITS) ×2 IMPLANT
MANIFOLD NEPTUNE II (INSTRUMENTS) ×2 IMPLANT
MARKER SKIN DUAL TIP RULER LAB (MISCELLANEOUS) ×2 IMPLANT
NDL HYPO 21X1.5 SAFETY (NEEDLE) ×1 IMPLANT
NEEDLE HYPO 21X1.5 SAFETY (NEEDLE) ×2 IMPLANT
NS IRRIG 1000ML POUR BTL (IV SOLUTION) ×2 IMPLANT
PACK TOTAL JOINT (CUSTOM PROCEDURE TRAY) ×2 IMPLANT
PAD ARMBOARD 7.5X6 YLW CONV (MISCELLANEOUS) ×2 IMPLANT
PAD DANNIFLEX CPM (ORTHOPEDIC SUPPLIES) ×2 IMPLANT
PIN TROCAR 3 INCH (PIN) ×1 IMPLANT
SAW OSC TIP CART 19.5X105X1.3 (SAW) ×2 IMPLANT
SET BASIN LINEN APH (SET/KITS/TRAYS/PACK) ×2 IMPLANT
SET HNDPC FAN SPRY TIP SCT (DISPOSABLE) ×1 IMPLANT
STAPLER VISISTAT 35W (STAPLE) ×2 IMPLANT
SUT BRALON NAB BRD #1 30IN (SUTURE) ×4 IMPLANT
SUT MNCRL 0 VIOLET CTX 36 (SUTURE) ×1 IMPLANT
SUT MON AB 0 CT1 (SUTURE) ×2 IMPLANT
SUT MON AB 2-0 CT1 36 (SUTURE) IMPLANT
SUT MONOCRYL 0 CTX 36 (SUTURE) ×1
SYR 20CC LL (SYRINGE) IMPLANT
SYR 30ML LL (SYRINGE) ×2 IMPLANT
SYR BULB IRRIGATION 50ML (SYRINGE) ×2 IMPLANT
TOWEL OR 17X26 4PK STRL BLUE (TOWEL DISPOSABLE) ×2 IMPLANT
TOWER CARTRIDGE SMART MIX (DISPOSABLE) ×2 IMPLANT
TRAY FOLEY W/METER SILVER 16FR (SET/KITS/TRAYS/PACK) ×2 IMPLANT
WATER STERILE IRR 1000ML POUR (IV SOLUTION) ×4 IMPLANT
YANKAUER SUCT 12FT TUBE ARGYLE (SUCTIONS) ×2 IMPLANT
YANKAUER SUCT BULB TIP NO VENT (SUCTIONS) ×1 IMPLANT

## 2017-01-29 NOTE — Progress Notes (Addendum)
2 units of PRBC available in the blood bank, [facility]. Duo-neb respiratory nebulizer treatment given as indicated, tolerated well.

## 2017-01-29 NOTE — Op Note (Signed)
01/29/2017  10:08 AM  PATIENT:  [name]  51 y.o. female  PRE-OPERATIVE DIAGNOSIS:  left knee osteoarthritis  POST-OPERATIVE DIAGNOSIS:  left knee osteoarthritis  PROCEDURE:  Procedure(s): TOTAL KNEE ARTHROPLASTY (Left)   69F  3T 12.5 poly  38 x 9 P  SURGEON:  Surgeon(s) and Role:    Vickki Hearing, MD - Primary  PHYSICIAN ASSISTANT:   ASSISTANTS: cynthia wrenn and [place] [employer]   [phone]   Surgical dictation for left total knee  Preop diagnosis osteoarthritis left knee  Postop diagnosis osteoarthritis left knee  Surgeon Dr. Romeo Apple [phone]  Surgical findings severe osteophyte formation arthritis in all 3 compartments especially medially with bone-on-bone changes, lateral compartment and patellofemoral compartments extensive osteophyte formation and cartilaginous erosion  Varus deformity was moderate  ORTHOALIGN assisted surgery   details of procedure:   The patient was identified in the preop holding area and the surgical site was confirmed as the left knee. Chart review and update were completed. The patient was taken to the operating room for spinal anesthesia. After successful spinal anesthesia Foley catheter was inserted. The patient was placed supine on the operating table. Vancomycin was used as an antibiotic due to penicillin allergy which was deemed severe    the left leg was prepped with DuraPrep and draped sterilely. Timeout was completed. The limb was then exsanguinated a  6 inch Esmarch. The tourniquet was elevated to 300 mmHg.   A midline incision was made and taken down to the extensor mechanism followed by medial arthrotomy. The patella was everted. A synovectomy was performed as needed. The osteophytes were resected.  Anterior cruciate ligament and PCL and medial and lateral meniscus were resected.   A pin was placed in the distal femur aimed towards the femoral head per protocol for the alignment assisted device  We set the femur for 2  of flexion, 0 mechanical axis and 11 mm distal resection  The cutting guide was pinned in place and after registration distal cut was made  THE femur was then measured to a size between a 2.5 and 3, I pinned the block at a size 3  The cutting block was placed to match the epicondyles and the 4 distal cuts were made.   the tibia was subluxated forward and the alignment device was pinned in the bone was registered and a 2 mm deficient side resection level was chosen. The saw was used to remove the proximal tibia. The tibia measured a size 3.  The alignment device was set for 1 valgus and 5 posterior slope   spacer blocks were placed starting with a 10 mm insert to confirm equal flexion-extension gaps. A size  12.5  mm insert balanced the gaps.  We placed the femoral notch cutting guide size 3  and resected the notch.   Trial implants were placed using appropriate size femur , appropriate size tibial baseplate which was measured after the proximal tibia resection. Tibial rotation was set patella tracking was normal   The tibia was then punched per manufacture technique making sure to avoid internal rotation.   The patella measured a size 24 mm thickness   We resected down to a size 14 mm using a size 38 x 9 button button.   Final range of motion check was performed with the appropriate size trials as mentioned above. Satisfactory reduction and motion were obtained.   Trial implants were removed. The bone was irrigated and dried and the cement was mixed on the  back table  exparel was injected in the soft tissues and posterior capsule of the knee  These implants were then cemented in place. Excess cement was removed. The cement was allowed to cure. Second irrigation was performed.    FInal range of motion check and stability check was completed  Passive range of motion test was 115  The wound was irrigated third time Hemovac drain was placed, extensor mechanism was closed with #1  Nurolon followed by 0 Monocryl and staples to reapproximate the skin edges and subcutaneous tissue.   Sterile dressing was applied  The patient was taken recovery in stable condition   ANESTHESIA:   spinal  EBL:  Total I/O In: 1600 [I.V.:1600] Out: 260 [Urine:250; Blood:10]  BLOOD ADMINISTERED:none  DRAINS:hemovac drain    LOCAL MEDICATIONS USED:  30 CC OF MARCAINE WITH EPI; EXPAREL 20 CC DILUTED WITH 40 CC OF SALINE   SPECIMEN:  No Specimen  DISPOSITION OF SPECIMEN:  N/A  COUNTS:  YES  TOURNIQUET:   Total Tourniquet Time Documented: Thigh (Left) - 101 minutes Total: Thigh (Left) - 101 minutes   DICTATION: .Reubin Milanragon Dictation  PLAN OF CARE: Admit to inpatient   PATIENT DISPOSITION:  PACU - hemodynamically stable.   Delay start of Pharmacological VTE agent (>24hrs) due to surgical blood loss or risk of bleeding: yes

## 2017-01-29 NOTE — Interval H&P Note (Signed)
History and Physical Interval Note:  01/29/2017 7:21 AM  Junious DresserJoan E Mizrahi  has presented today for surgery, with the diagnosis of left knee osteoarthritis  The various methods of treatment have been discussed with the patient and family. After consideration of risks, benefits and other options for treatment, the patient has consented to  Procedure(s): TOTAL KNEE ARTHROPLASTY (Left) as a surgical intervention .  The patient's history has been reviewed, patient examined, no change in status, stable for surgery.  I have reviewed the patient's chart and labs.  Questions were answered to the patient's satisfaction.     Fuller CanadaStanley Shermaine Rivet

## 2017-01-29 NOTE — Brief Op Note (Addendum)
01/29/2017  10:08 AM  PATIENT:  [name]  51 y.o. female  PRE-OPERATIVE DIAGNOSIS:  left knee osteoarthritis  POST-OPERATIVE DIAGNOSIS:  left knee osteoarthritis  PROCEDURE:  Procedure(s): TOTAL KNEE ARTHROPLASTY (Left)   102F  3T 12.5 poly  38 x 9 P  SURGEON:  Surgeon(s) and Role:    Vickki Hearing* Harrison, Stanley E, MD - Primary  PHYSICIAN ASSISTANT:   ASSISTANTS: cynthia wrenn and [place] Nation[place]ty ashley   [phone]   ANESTHESIA:   spinal  EBL:  Total I/O In: 1600 [I.V.:1600] Out: 260 [Urine:250; Blood:10]  BLOOD ADMINISTERED:none  DRAINS:hemovac drain    LOCAL MEDICATIONS USED:  30 CC OF MARCAINE WITH EPI; EXPAREL 20 CC DILUTED WITH 40 CC OF SALINE   SPECIMEN:  No Specimen  DISPOSITION OF SPECIMEN:  N/A  COUNTS:  YES  TOURNIQUET:   Total Tourniquet Time Documented: Thigh (Left) - 101 minutes Total: Thigh (Left) - 101 minutes   DICTATION: .Reubin Milanragon Dictation  PLAN OF CARE: Admit to inpatient   PATIENT DISPOSITION:  PACU - hemodynamically stable.   Delay start of Pharmacological VTE agent (>24hrs) due to surgical blood loss or risk of bleeding: yes

## 2017-01-29 NOTE — Transfer of Care (Signed)
Immediate Anesthesia Transfer of Care Note  Patient: Connie DresserJoan E Savell  Procedure(s) Performed: Procedure(s): TOTAL KNEE ARTHROPLASTY (Left)  Patient Location: PACU  Anesthesia Type:Spinal  Level of Consciousness: awake, alert , oriented and patient cooperative  Airway & Oxygen Therapy: Patient Spontanous Breathing and Patient connected to nasal cannula oxygen  Post-op Assessment: Report given to RN and Post -op Vital signs reviewed and stable  Post vital signs: Reviewed and stable  Last Vitals:  Vitals:   01/29/17 0715 01/29/17 0720  BP: 131/76 120/77  Pulse:    Resp: 12 14  Temp:      Last Pain:  Vitals:   01/29/17 0642  TempSrc: Oral  PainSc: 2       Patients Stated Pain Goal: 7 (01/29/17 [id number])  Complications: No apparent anesthesia complications

## 2017-01-29 NOTE — Anesthesia Procedure Notes (Signed)
Spinal  Patient location during procedure: OR Start time: 01/29/2017 7:50 AM Staffing Resident/CRNA: Lorie Cleckley A Other anesthesia staff: Franco NonesYATES, TERESA S Preanesthetic Checklist Completed: patient identified, site marked, surgical consent, pre-op evaluation, timeout performed, IV checked, risks and benefits discussed and monitors and equipment checked Spinal Block Patient position: left lateral decubitus Prep: Betadine Patient monitoring: heart rate, cardiac monitor, continuous pulse ox and blood pressure Approach: left paramedian Location: L3-4 Injection technique: single-shot Needle Needle type: Spinocan  Needle gauge: 22 G Needle length: 9 cm Assessment Sensory level: T8 Additional Notes  ATTEMPTS:2 TRAY RU:[id number] TRAY EXPIRATION DATE:06/22/2019 Attempt spinal by Pernell DupreAdams, CRNA; Spinal placed by T. Ophelia CharterYates, CRNA; Patient tolerated well

## 2017-01-29 NOTE — Anesthesia Postprocedure Evaluation (Signed)
Anesthesia Post Note  Patient: [name]  Procedure(s) Performed: Procedure(s) (LRB): TOTAL KNEE ARTHROPLASTY (Left)  Patient location during evaluation: PACU Anesthesia Type: Spinal Level of consciousness: awake and alert, oriented and patient cooperative Pain management: pain level controlled Vital Signs Assessment: post-procedure vital signs reviewed and stable Respiratory status: spontaneous breathing and patient connected to nasal cannula oxygen Cardiovascular status: stable Postop Assessment: no signs of nausea or vomiting Anesthetic complications: no     Last Vitals:  Vitals:   01/29/17 1009 01/29/17 1015  BP: 108/64 108/66  Pulse: 66 63  Resp: 13 11  Temp: 36.4 C     Last Pain:  Vitals:   01/29/17 1009  TempSrc:   PainSc: 0-No pain                 ADAMS, AMY A

## 2017-01-29 NOTE — Anesthesia Preprocedure Evaluation (Signed)
Anesthesia Evaluation  Patient identified by MRN, date of birth, ID band Patient awake    Reviewed: Allergy & Precautions, NPO status , Patient's Chart, lab work & pertinent test results  Airway Mallampati: II  TM Distance: >3 FB     Dental  (+) Teeth Intact   Pulmonary sleep apnea , COPD,  COPD inhaler,    breath sounds clear to auscultation       Cardiovascular hypertension, Pt. on medications  Rhythm:Regular Rate:Normal     Neuro/Psych    GI/Hepatic negative GI ROS,   Endo/Other  Hypothyroidism   Renal/GU      Musculoskeletal  (+) Arthritis ,   Abdominal   Peds  Hematology   Anesthesia Other Findings   Reproductive/Obstetrics                             Anesthesia Physical Anesthesia Plan  ASA: III  Anesthesia Plan: Spinal   Post-op Pain Management:    Induction:   Airway Management Planned: Simple Face Mask  Additional Equipment:   Intra-op Plan:   Post-operative Plan:   Informed Consent: I have reviewed the patients History and Physical, chart, labs and discussed the procedure including the risks, benefits and alternatives for the proposed anesthesia with the patient or authorized representative who has indicated his/her understanding and acceptance.     Plan Discussed with:   Anesthesia Plan Comments:         Anesthesia Quick Evaluation

## 2017-01-29 NOTE — Evaluation (Signed)
Physical Therapy Evaluation Patient Details Name: [name] MRN: [id number] DOB: 01/22/1966 Today's Date: 01/29/2017   History of Present Illness  L TKA  Clinical Impression  Pt received in bed, with multiple family members present, and pt is agreeable to PT evaluation.  Prior to admission, pt was independent with ambulation, and ADL's.  She works at an ALF to assist patients.  During PT evaluation, she was able to complete exercises, however, she had great difficulty with the short arc quad due to increased pain.  Pt states that she had difficulty with flexion of the L knee prior to admission. She was able to transfer supine<>sit with supervision, and required Min A with RW for sit<>Stand.  She ambulated a total of 6515ft with RW and Min A.  Throughout the entire PT evaluation, she requires cues for breathing.  She seems very anxious and has a tendency to perform valsalva.  Recommend HHPT upon d/c to continue to progress with strength, balance and ROM.   # OF FEET WALKED: 15 with RW and Min A ROM:  Flexion: 60* - very limited by pain            Extension: -10*    Follow Up Recommendations Home health PT    Equipment Recommendations  None recommended by PT    Recommendations for Other Services       Precautions / Restrictions Precautions Precautions: Fall Precaution Comments: due to surgery Restrictions Weight Bearing Restrictions: No      Mobility  Bed Mobility Overal bed mobility: Needs Assistance Bed Mobility: Supine to Sit;Sit to Supine     Supine to sit: Supervision;HOB elevated Sit to supine: Supervision      Transfers Overall transfer level: Needs assistance Equipment used: Rolling walker (2 wheeled) Transfers: Sit to/from Stand Sit to Stand: Min assist         General transfer comment: vc's to push up from the bed.    Ambulation/Gait Ambulation/Gait assistance: Min assist Ambulation Distance (Feet): 15 Feet Assistive device: Rolling walker (2  wheeled) Gait Pattern/deviations: Step-to pattern;Trunk flexed;Antalgic   Gait velocity interpretation: <1.8 ft/sec, indicative of risk for recurrent falls General Gait Details: increased time with near constant vc's for deep breathing and relaxation.   Stairs            Wheelchair Mobility    Modified Rankin (Stroke Patients Only)       Balance Overall balance assessment: Needs assistance Sitting-balance support: Bilateral upper extremity supported;Feet supported Sitting balance-Leahy Scale: Good     Standing balance support: Bilateral upper extremity supported Standing balance-Leahy Scale: Fair                               Pertinent Vitals/Pain Pain Assessment: 0-10 Pain Score: 9  Pain Location: L knee Pain Descriptors / Indicators: Aching;Throbbing Pain Intervention(s): Limited activity within patient's tolerance;Monitored during session;Premedicated before session;Repositioned    Home Living   Living Arrangements:  (Pansy Graves. ) Available Help at Discharge: Available 24 hours/day Type of Home: Mobile home Home Access: Stairs to enter Entrance Stairs-Rails: Can reach both Entrance Stairs-Number of Steps: 4 with HR on both sides Home Layout: One level Home Equipment: Bedside commode;Cane - single point;Other (comment) (CPM delivered yesterday) Additional Comments: borrowed a RW     Prior Function Level of Independence: Independent   Gait / Transfers Assistance Needed: independent  ADL's / Homemaking Assistance Needed: independent  Hand Dominance        Extremity/Trunk Assessment   Upper Extremity Assessment Upper Extremity Assessment: Overall WFL for tasks assessed    Lower Extremity Assessment Lower Extremity Assessment: Generalized weakness       Communication   Communication: No difficulties  Cognition Arousal/Alertness: Awake/alert Behavior During Therapy: WFL for tasks assessed/performed Overall Cognitive  Status: Within Functional Limits for tasks assessed                                        General Comments      Exercises Total Joint Exercises Ankle Circles/Pumps: AROM;Both;20 reps;Supine Quad Sets: Strengthening;Both;10 reps;Supine Short Arc Quad: Strengthening;Left;5 reps;Supine;Limitations Short Arc Quad Limitations: Reps limited due to increased pain Heel Slides: AROM;Left;10 reps;Supine Goniometric ROM: L knee extension -10*, flexion 60*   Assessment/Plan    PT Assessment Patient needs continued PT services  PT Problem List Decreased strength;Decreased range of motion;Decreased activity tolerance;Decreased balance;Decreased mobility;Obesity;Pain       PT Treatment Interventions DME instruction;Gait training;Stair training;Functional mobility training;Therapeutic activities;Therapeutic exercise;Balance training;Patient/family education    PT Goals (Current goals can be found in the Care Plan section)  Acute Rehab PT Goals Patient Stated Goal: To go home.  PT Goal Formulation: With patient Time For Goal Achievement: 02/05/17 Potential to Achieve Goals: Good    Frequency BID   Barriers to discharge        Co-evaluation               AM-PAC PT "6 Clicks" Daily Activity  Outcome Measure Difficulty turning over in bed (including adjusting bedclothes, sheets and blankets)?: None Difficulty moving from lying on back to sitting on the side of the bed? : None Difficulty sitting down on and standing up from a chair with arms (e.g., wheelchair, bedside commode, etc,.)?: A Little Help needed moving to and from a bed to chair (including a wheelchair)?: A Little Help needed walking in hospital room?: A Little Help needed climbing 3-5 steps with a railing? : A Little 6 Click Score: 20    End of Session Equipment Utilized During Treatment: Gait belt Activity Tolerance: Patient limited by pain Patient left: in bed;with call bell/phone within reach;with  family/visitor present Nurse Communication: Mobility status (Tamika, RN notified of pt's mobility status, and mobility sheet left hanging in the room.  ) PT Visit Diagnosis: Other abnormalities of gait and mobility (R26.89);Muscle weakness (generalized) (M62.81);Pain Pain - Right/Left: Left Pain - part of body: Knee    Time: 1450-1544 PT Time Calculation (min) (ACUTE ONLY): 54 min   Charges:   PT Evaluation $PT Eval Low Complexity: 1 Procedure PT Treatments $Gait Training: 8-22 mins $Therapeutic Exercise: 8-22 mins $Therapeutic Activity: 8-22 mins   PT G Codes:   PT G-Codes **NOT FOR INPATIENT CLASS** Functional Assessment Tool Used: AM-PAC 6 Clicks Basic Mobility;Clinical judgement Functional Limitation: Mobility: Walking and moving around Mobility: Walking and Moving Around Current Status (Z6109): At least 20 percent but less than 40 percent impaired, limited or restricted Mobility: Walking and Moving Around Goal Status [phone]): At least 1 percent but less than 20 percent impaired, limited or restricted    Beth Jazzelle Zhang, PT, DPT X: [phone]

## 2017-01-29 NOTE — Anesthesia Procedure Notes (Signed)
Procedure Name: MAC Date/Time: 01/29/2017 7:24 AM Performed by: Andree Elk, AMY A Pre-anesthesia Checklist: Suction available, Emergency Drugs available, Patient identified and Timeout performed Oxygen Delivery Method: Simple face mask

## 2017-01-30 LAB — BASIC METABOLIC PANEL
Anion gap: 5 (ref 5–15)
BUN: 8 mg/dL (ref 6–20)
CHLORIDE: 104 mmol/L (ref 101–111)
CO2: 30 mmol/L (ref 22–32)
Calcium: 8.2 mg/dL — ABNORMAL LOW (ref 8.9–10.3)
Creatinine, Ser: 0.54 mg/dL (ref 0.44–1.00)
GFR calc non Af Amer: >60 mL/min (ref >60)
Glucose, Bld: 128 mg/dL — ABNORMAL HIGH (ref 65–99)
POTASSIUM: 4 mmol/L (ref 3.5–5.1)
SODIUM: 139 mmol/L (ref 135–145)

## 2017-01-30 LAB — CBC
HCT: 37 % (ref 36.0–46.0)
HEMOGLOBIN: 12.3 g/dL (ref 12.0–15.0)
MCH: 29.6 pg (ref 26.0–34.0)
MCHC: 33.2 g/dL (ref 30.0–36.0)
MCV: 88.9 fL (ref 78.0–100.0)
Platelets: 169 10*3/uL (ref 150–400)
RBC: 4.16 MIL/uL (ref 3.87–5.11)
RDW: 13.7 % (ref 11.5–15.5)
WBC: 11.1 10*3/uL — ABNORMAL HIGH (ref 4.0–10.5)

## 2017-01-30 NOTE — Anesthesia Postprocedure Evaluation (Signed)
Anesthesia Post Note  Patient: [name]  Procedure(s) Performed: Procedure(s) (LRB): TOTAL KNEE ARTHROPLASTY (Left)  Patient location during evaluation: Nursing Unit Anesthesia Type: Spinal Level of consciousness: awake and alert Pain management: satisfactory to patient Vital Signs Assessment: post-procedure vital signs reviewed and stable Respiratory status: spontaneous breathing and patient connected to nasal cannula oxygen Cardiovascular status: stable Anesthetic complications: no     Last Vitals:  Vitals:   01/29/17 2300 01/30/17 0300  BP: 135/69 127/63  Pulse: 87 68  Resp: 17 18  Temp: 36.6 C 36.6 C    Last Pain:  Vitals:   01/30/17 0556  TempSrc:   PainSc: Asleep                 Fredrik Mogel

## 2017-01-30 NOTE — Progress Notes (Signed)
Physical Therapy Treatment Patient Details Name: [name] MRN: [id number] DOB: 03/02/1966 Today's Date: 01/30/2017    History of Present Illness [name] is a 50yo white female [facility]KA on 5/10.     PT Comments    Pt tolerating treatment session well, motivated and able to complete entire PT sesssion as planned. Pt continues to make progress toward goals as evidenced by left knee flexion ROM progressing to 8-58 degrees, and AMB distance of 8860ft today compared to 15 feet 1DA. Pt's greatest limitation continues to be high levels of pain which continues to limit ability to perform AMB at PLOF and HEP without significant assistance. Patient presenting with impairment of strength, pain, range of motion, balance, and activity tolerance, limiting ability to perform ADL and mobility tasks at  baseline level of function. Patient will benefit from skilled intervention to address the above impairments and limitations, in order to restore to prior level of function, improve patient safety upon discharge, and to decrease caregiver burden.     Follow Up Recommendations  Home health PT     Equipment Recommendations  None recommended by PT    Recommendations for Other Services       Precautions / Restrictions Precautions Precautions: Fall Precaution Comments: due to surgery Restrictions Weight Bearing Restrictions: Yes LLE Weight Bearing: Weight bearing as tolerated    Mobility  Bed Mobility Overal bed mobility:  (received in chair;) Bed Mobility: Supine to Sit     Supine to sit: Supervision;HOB elevated        Transfers Overall transfer level: Needs assistance Equipment used: Rolling walker (2 wheeled) Transfers: Sit to/from Stand Sit to Stand: Supervision         General transfer comment: self limited weightbearing in LLE during transfer  Ambulation/Gait Ambulation/Gait assistance: Supervision Ambulation Distance (Feet): 60 Feet Assistive  device: Rolling walker (2 wheeled) Gait Pattern/deviations: Step-to pattern;Trunk flexed;Antalgic   Gait velocity interpretation: <1.8 ft/sec, indicative of risk for recurrent falls General Gait Details: additional time given due to fatigue   Stairs            Wheelchair Mobility    Modified Rankin (Stroke Patients Only)       Balance                                            Cognition Arousal/Alertness: Awake/alert Behavior During Therapy: WFL for tasks assessed/performed Overall Cognitive Status: Within Functional Limits for tasks assessed                                        Exercises Total Joint Exercises Ankle Circles/Pumps: AROM;Both;20 reps;Seated Quad Sets: 15 reps;Left;Seated;AROM Heel Slides: Left;AAROM;15 reps;Seated (very painful and limited; mod assist given) Hip ABduction/ADduction: AAROM;Left;15 reps;Seated (minA required) Long Arc Quad: AAROM;Left;Seated;10 reps (progressively stronger throughout; tolerated well compared to start of session. ) Goniometric ROM: Left knee flexion ROM: 8-58 degrees     General Comments        Pertinent Vitals/Pain Pain Assessment: 0-10 Pain Score: 9  (7/10 resting, 9-10/10 during gentle threrex. ) Pain Location: L knee Pain Descriptors / Indicators: Aching;Throbbing Pain Intervention(s): Limited activity within patient's tolerance;Monitored during session;Premedicated before session;Repositioned;Ice applied    Home Living Family/patient expects to be discharged to:: Private  residence   Available Help at Discharge: Available 24 hours/day Type of Home: Mobile home Home Access: Stairs to enter Entrance Stairs-Rails: Can reach both Home Layout: One level Home Equipment: Bedside commode;Cane - single point;Other (comment);Shower seat Additional Comments: borrowed a RW     Prior Function Level of Independence: Paramedic / Transfers Assistance Needed: independent ADL's  / Homemaking Assistance Needed: independent     PT Goals (current goals can now be found in the care plan section) Acute Rehab PT Goals Patient Stated Goal: To go home.  PT Goal Formulation: With patient Time For Goal Achievement: 02/05/17 Potential to Achieve Goals: Good Progress towards PT goals: Progressing toward goals    Frequency    BID      PT Plan Current plan remains appropriate    Co-evaluation              AM-PAC PT "6 Clicks" Daily Activity  Outcome Measure                   End of Session Equipment Utilized During Treatment: Gait belt Activity Tolerance: Patient limited by pain;Patient limited by fatigue;Patient tolerated treatment well Patient left: with call bell/phone within reach;with family/visitor present;in chair   PT Visit Diagnosis: Other abnormalities of gait and mobility (R26.89);Muscle weakness (generalized) (M62.81);Pain Pain - Right/Left: Left Pain - part of body: Knee     Time: 1000-1038 PT Time Calculation (min) (ACUTE ONLY): 38 min  Charges:  $Therapeutic Exercise: 8-22 mins $Therapeutic Activity: 23-37 mins                    G Codes:           10:48 AM, 31-Jan-2017 Rosamaria Lints, PT, DPT Physical Therapist - [place] [phone] (ASCOM)  [phone] (mobile)    Shamica Moree C Jan 31, 2017, 10:46 AM

## 2017-01-30 NOTE — Evaluation (Signed)
Occupational Therapy Evaluation Patient Details Name: [name] MRN: [id number] DOB: 02/04/1966 Today's Date: 01/30/2017    History of Present Illness L TKA   Clinical Impression   Pt in bed upon therapy arrival and agreeable to participate in OT evaluation. Patient reports that she lives with a friend who will be able to help her at discharge. Discussed using a shower chair to sit when bathing at home. Recommended using a long handled sponge and reacher during bathing and dressing. Discussed removing any throw rugs and being all items that are used on a daily basis within easy reach versus having to reach up overhead or bend down below. All OT education complete this date. No further OT needs at this time.    Follow Up Recommendations  No OT follow up    Equipment Recommendations  None recommended by OT       Precautions / Restrictions Precautions Precautions: Fall Precaution Comments: due to surgery Restrictions Weight Bearing Restrictions: No      Mobility Bed Mobility Overal bed mobility: Needs Assistance Bed Mobility: Supine to Sit     Supine to sit: Supervision;HOB elevated        Transfers Overall transfer level: Needs assistance Equipment used: Rolling walker (2 wheeled) Transfers: Sit to/from Stand Sit to Stand: Min assist         General transfer comment: vc's to push up from the bed.          ADL either performed or assessed with clinical judgement   ADL Overall ADL's : Needs assistance/impaired Eating/Feeding: Independent;Sitting   Grooming: Wash/dry hands;Set up;Sitting               Lower Body Dressing: Total assistance;Bed level Lower Body Dressing Details (indicate cue type and reason): to donn hospital socks (due to recent surgery) Toilet Transfer: Min guard;RW;BSC   Toileting- Clothing Manipulation and Hygiene: Set up;Sitting/lateral lean                            Pertinent Vitals/Pain Pain Assessment: 0-10 Pain  Score: 9  Pain Location: L knee Pain Descriptors / Indicators: Aching;Throbbing Pain Intervention(s): Limited activity within patient's tolerance;Monitored during session;Premedicated before session;Repositioned;Ice applied     Hand Dominance Right   Extremity/Trunk Assessment Upper Extremity Assessment Upper Extremity Assessment: Overall WFL for tasks assessed   Lower Extremity Assessment Lower Extremity Assessment: Defer to PT evaluation       Communication Communication Communication: No difficulties   Cognition Arousal/Alertness: Awake/alert Behavior During Therapy: WFL for tasks assessed/performed Overall Cognitive Status: Within Functional Limits for tasks assessed                                                Home Living Family/patient expects to be discharged to:: Private residence   Available Help at Discharge: Available 24 hours/day Type of Home: Mobile home Home Access: Stairs to enter Entrance Stairs-Number of Steps: 4 with HR on both sides Entrance Stairs-Rails: Can reach both Home Layout: One level     Bathroom Shower/Tub: Chief Strategy OfficerTub/shower unit   Bathroom Toilet: Standard     Home Equipment: Bedside commode;Cane - single point;Other (comment);Shower seat   Additional Comments: borrowed a RW       Prior Functioning/Environment Level of Independence: Paramedicndependent  Gait / Transfers Assistance Needed: independent ADL's / Homemaking  Assistance Needed: independent                           Barriers to D/C:  None             AM-PAC PT "6 Clicks" Daily Activity     Outcome Measure Help from another person eating meals?: None Help from another person taking care of personal grooming?: None Help from another person toileting, which includes using toliet, bedpan, or urinal?: A Little Help from another person bathing (including washing, rinsing, drying)?: A Little Help from another person to put on and taking off regular upper  body clothing?: None Help from another person to put on and taking off regular lower body clothing?: Total 6 Click Score: 19   End of Session Equipment Utilized During Treatment: Gait belt;Rolling walker  Activity Tolerance: Patient tolerated treatment well Patient left: in chair;with call bell/phone within reach;with family/visitor present  OT Visit Diagnosis: Muscle weakness (generalized) (M62.81)                Time: 1610-9604 OT Time Calculation (min): 33 min Charges:  OT General Charges $OT Visit: 1 Procedure OT Evaluation $OT Eval Low Complexity: 1 Procedure OT Treatments $Self Care/Home Management : 8-22 mins G-Codes: OT G-codes **NOT FOR INPATIENT CLASS** Functional Assessment Tool Used: AM-PAC 6 Clicks Daily Activity Functional Limitation: Self care Self Care Current Status (V4098): At least 40 percent but less than 60 percent impaired, limited or restricted Self Care Goal Status (J1914): At least 40 percent but less than 60 percent impaired, limited or restricted Self Care Discharge Status [phone]): At least 40 percent but less than 60 percent impaired, limited or restricted   Limmie Patricia, OTR/L,CBIS  [phone]  [name], Charisse March 01/30/2017, 10:10 AM

## 2017-01-30 NOTE — Progress Notes (Addendum)
Patient ID: [name], female   DOB: 08/15/1966, 51 y.o.   MRN: [id number]  POD # 1 Status post left total knee   VS BP 127/63 (BP Location: Left Arm)   Pulse 68   Temp 97.8 F (36.6 C) (Oral)   Resp 18   Ht 5\' 3"  (1.6 m)   Wt 223 lb (101.2 kg)   LMP 04/14/2011   SpO2 96%   BMI 39.50 kg/m    DRESSING minimal soilage    Current Facility-Administered Medications:  .  0.9 %  sodium chloride infusion, , Intravenous, Continuous, Vickki HearingHarrison, Fadel Clason E, MD, Last Rate: 100 mL/hr at 01/30/17 0008 .  acetaminophen (TYLENOL) tablet 650 mg, 650 mg, Oral, Q6H PRN **OR** acetaminophen (TYLENOL) suppository 650 mg, 650 mg, Rectal, Q6H PRN, Vickki HearingHarrison, Hilaria Titsworth E, MD .  alum & mag hydroxide-simeth (MAALOX/MYLANTA) 200-200-20 MG/5ML suspension 30 mL, 30 mL, Oral, Q4H PRN, Vickki HearingHarrison, Tacuma Graffam E, MD .  aspirin EC tablet 325 mg, 325 mg, Oral, Q breakfast, Vickki HearingHarrison, Cristol Engdahl E, MD .  bisacodyl (DULCOLAX) EC tablet 5 mg, 5 mg, Oral, Daily PRN, Vickki HearingHarrison, Nilza Eaker E, MD .  celecoxib (CELEBREX) capsule 200 mg, 200 mg, Oral, Q12H, Vickki HearingHarrison, Azka Steger E, MD, 200 mg at 01/29/17 2045 .  clobetasol cream (TEMOVATE) 0.05 % 1 application, 1 application, Topical, BID PRN, Vickki HearingHarrison, Cedra Villalon E, MD .  dexamethasone (DECADRON) injection 10 mg, 10 mg, Intravenous, Once, Vickki HearingHarrison, Giovanna Kemmerer E, MD .  diphenhydrAMINE (BENADRYL) 12.5 MG/5ML elixir 12.5-25 mg, 12.5-25 mg, Oral, Q4H PRN, Vickki HearingHarrison, Ladell Lea E, MD .  docusate sodium (COLACE) capsule 100 mg, 100 mg, Oral, BID, Vickki HearingHarrison, Reham Slabaugh E, MD, 100 mg at 01/29/17 2037 .  gabapentin (NEURONTIN) capsule 300 mg, 300 mg, Oral, TID, Vickki HearingHarrison, Callen Zuba E, MD, 300 mg at 01/29/17 2037 .  hydrochlorothiazide (HYDRODIURIL) tablet 25 mg, 25 mg, Oral, Daily, Vickki HearingHarrison, Kjerstin Abrigo E, MD, 25 mg at 01/29/17 1336 .  HYDROcodone-acetaminophen (NORCO) 7.5-325 MG per tablet 1 tablet, 1 tablet, Oral, Q6H, Vickki HearingHarrison, Jarin Cornfield E, MD, 1 tablet at 01/30/17 0456 .  HYDROcodone-acetaminophen (NORCO/VICODIN) 5-325 MG  per tablet 1 tablet, 1 tablet, Oral, Q8H PRN, Vickki HearingHarrison, Patton Rabinovich E, MD, 1 tablet at 01/29/17 1336 .  HYDROmorphone (DILAUDID) injection 0.5 mg, 0.5 mg, Intravenous, Q2H PRN, Vickki HearingHarrison, Tarun Patchell E, MD, 0.5 mg at 01/29/17 1556 .  ketorolac (TORADOL) 15 MG/ML injection 7.5 mg, 7.5 mg, Intravenous, Q6H, Vickki HearingHarrison, Harve Spradley E, MD, 7.5 mg at 01/30/17 0457 .  levothyroxine (SYNTHROID, LEVOTHROID) tablet 25 mcg, 25 mcg, Oral, QAC breakfast, Vickki HearingHarrison, Lenora Gomes E, MD .  magnesium citrate solution 1 Bottle, 1 Bottle, Oral, Once PRN, Vickki HearingHarrison, Korayma Hagwood E, MD .  menthol-cetylpyridinium (CEPACOL) lozenge 3 mg, 1 lozenge, Oral, PRN **OR** phenol (CHLORASEPTIC) mouth spray 1 spray, 1 spray, Mouth/Throat, PRN, Vickki HearingHarrison, Constantin Hillery E, MD .  methocarbamol (ROBAXIN) tablet 500 mg, 500 mg, Oral, Q6H PRN **OR** methocarbamol (ROBAXIN) 500 mg in dextrose 5 % 50 mL IVPB, 500 mg, Intravenous, Q6H PRN, Vickki HearingHarrison, Maury Bamba E, MD .  metoCLOPramide (REGLAN) tablet 5-10 mg, 5-10 mg, Oral, Q8H PRN **OR** metoCLOPramide (REGLAN) injection 5-10 mg, 5-10 mg, Intravenous, Q8H PRN, Vickki HearingHarrison, Xhaiden Coombs E, MD .  mometasone-formoterol (DULERA) 100-5 MCG/ACT inhaler 2 puff, 2 puff, Inhalation, BID, Vickki HearingHarrison, Maalik Pinn E, MD, 2 puff at 01/30/17 0741 .  ondansetron (ZOFRAN) tablet 4 mg, 4 mg, Oral, Q6H PRN **OR** ondansetron (ZOFRAN) injection 4 mg, 4 mg, Intravenous, Q6H PRN, Vickki HearingHarrison, Shakari Qazi E, MD .  oxyCODONE (Oxy IR/ROXICODONE) immediate release tablet 5-10 mg, 5-10 mg, Oral, Q3H PRN, Romeo AppleHarrison,  Fernande Boyden, MD .  potassium chloride SA (K-DUR,KLOR-CON) CR tablet 40 mEq, 40 mEq, Oral, TID, Vickki Hearing, MD, 40 mEq at 01/29/17 2036 .  pravastatin (PRAVACHOL) tablet 20 mg, 20 mg, Oral, QPM, Vickki Hearing, MD, 20 mg at 01/29/17 1600 .  senna-docusate (Senokot-S) tablet 1 tablet, 1 tablet, Oral, QHS PRN, Vickki Hearing, MD .  vitamin B-12 (CYANOCOBALAMIN) tablet 500 mcg, 500 mcg, Oral, Daily, Vickki Hearing, MD BMP Latest Ref Rng & Units  01/30/2017 01/27/2017 09/19/2016  Glucose 65 - 99 mg/dL 161(W) 960(A) 540(J)  BUN 6 - 20 mg/dL 8 15 15   Creatinine 0.44 - 1.00 mg/dL 8.11 9.14 7.82  Sodium 135 - 145 mmol/L 139 138 134(L)  Potassium 3.5 - 5.1 mmol/L 4.0 3.7 3.2(L)  Chloride 101 - 111 mmol/L 104 98(L) 99(L)  CO2 22 - 32 mmol/L 30 31 27   Calcium 8.9 - 10.3 mg/dL 8.2(L) 9.3 8.3(L)   CBC Latest Ref Rng & Units 01/30/2017 01/27/2017 09/19/2016  WBC 4.0 - 10.5 K/uL 11.1(H) 9.9 10.3  Hemoglobin 12.0 - 15.0 g/dL 95.6 21.3 08.6  Hematocrit 36.0 - 46.0 % 37.0 41.2 40.1  Platelets 150 - 400 K/uL 169 207 190    Neuro-vasculo-motor status of operative limb normal  Homans sign normal calf supple  Assessment status post total knee arthroplasty patient doing well  Plan continue physical therapy. Provide adequate pain control.

## 2017-01-30 NOTE — Care Management Note (Signed)
Case Management Note  Patient Details  Name: [name] MRN: [id number] Date of Birth: 09/10/1966  Subjective/Objective:    Adm with left TKA. [facility]. CPM has been delivered to patient's home already.            Action/Plan: [facility] notified that patient will mostly likely DC home tomorrow. No other CM needs.    Expected Discharge Date:      01/31/2017            Expected Discharge Plan:  Home w Home Health Services  In-House Referral:     Discharge planning Services  CM Consult  Post Acute Care Choice:  Home Health Choice offered to:  Patient  DME Arranged:    DME Agency:     HH Arranged:  PT [facility]  Status of Service:  Completed, signed off  If discussed at [employer] Length of Stay Meetings, dates discussed:    Additional Comments:  Julie-Ann Vanmaanen, Chrystine OilerSharley Diane, RN 01/30/2017, 11:29 AM

## 2017-01-30 NOTE — Progress Notes (Signed)
Physical Therapy Treatment Patient Details Name: [name] MRN: [id number] DOB: [birth year] Today's Date: 01/30/2017    History of Present Illness [name] is a 51yo white female who comes to [facility] for elevtive Left TKA on 5/10.     PT Comments    Pt tolerating treatment session well, pain much more controlled than during AM session. Pt showing improved tolerance to ROM, exercises, and AMB. This session Left knee flexion ROM: 9-66 degrees, and AMB >165ft c RW. Pt needs review of bed level HEP and education of self assistance with sheet/gait belt for independent performance at home. Making good progress toward goals overall.     Follow Up Recommendations  Home health PT     Equipment Recommendations  None recommended by PT    Recommendations for Other Services       Precautions / Restrictions Precautions Precautions: Fall Restrictions Weight Bearing Restrictions: Yes LLE Weight Bearing: Weight bearing as tolerated    Mobility  Bed Mobility Overal bed mobility: Modified Independent Bed Mobility: Sit to Supine       Sit to supine: Modified independent (Device/Increase time)      Transfers Overall transfer level: Needs assistance Equipment used: Rolling walker (2 wheeled) Transfers: Sit to/from Stand Sit to Stand: Supervision         General transfer comment: self limited weightbearing in LLE during transfer  Ambulation/Gait   Ambulation Distance (Feet): 120 Feet Assistive device: Rolling walker (2 wheeled) Gait Pattern/deviations: Step-to pattern;Trunk flexed;Antalgic Gait velocity: 0.44m/s Gait velocity interpretation: <1.8 ft/sec, indicative of risk for recurrent falls General Gait Details: Able to complete without rest break    Stairs            Wheelchair Mobility    Modified Rankin (Stroke Patients Only)       Balance                                            Cognition Arousal/Alertness: Awake/alert Behavior  During Therapy: WFL for tasks assessed/performed Overall Cognitive Status: Within Functional Limits for tasks assessed                                        Exercises Total Joint Exercises Ankle Circles/Pumps: AROM;Both;20 reps;Seated Hip ABduction/ADduction: AAROM;Left;15 reps;Seated Long Arc Quad: AAROM;Left;Seated;15 reps Goniometric ROM: Left knee flexion: 9-66 degrees    General Comments        Pertinent Vitals/Pain Pain Assessment: 0-10 Pain Score: 4  Pain Location: L knee Pain Descriptors / Indicators: Aching;Throbbing Pain Intervention(s): Limited activity within patient's tolerance;Monitored during session;Premedicated before session;Repositioned    Home Living                      Prior Function            PT Goals (current goals can now be found in the care plan section) Acute Rehab PT Goals Patient Stated Goal: To go home.  PT Goal Formulation: With patient Time For Goal Achievement: 02/05/17 Potential to Achieve Goals: Good Progress towards PT goals: Progressing toward goals    Frequency    BID      PT Plan Current plan remains appropriate    Co-evaluation              AM-PAC  PT "6 Clicks" Daily Activity  Outcome Measure  Difficulty turning over in bed (including adjusting bedclothes, sheets and blankets)?: None Difficulty moving from lying on back to sitting on the side of the bed? : None Difficulty sitting down on and standing up from a chair with arms (e.g., wheelchair, bedside commode, etc,.)?: A Little Help needed moving to and from a bed to chair (including a wheelchair)?: A Little Help needed walking in hospital room?: A Little Help needed climbing 3-5 steps with a railing? : A Lot 6 Click Score: 19    End of Session Equipment Utilized During Treatment: Gait belt Activity Tolerance: Patient tolerated treatment well Patient left: with call bell/phone within reach;in bed;in CPM;with bed alarm set Nurse  Communication: Mobility status (CPM status) PT Visit Diagnosis: Other abnormalities of gait and mobility (R26.89);Muscle weakness (generalized) (M62.81);Pain Pain - Right/Left: Left Pain - part of body: Knee     Time: 1353-1443 PT Time Calculation (min) (ACUTE ONLY): 50 min  Charges:  $Therapeutic Exercise: 8-22 mins $Therapeutic Activity: 23-37 mins                    G Codes:       2:54 PM, 01/30/17 Rosamaria LintsAllan C Buccola, PT, DPT Physical Therapist - [place] [phone] [facility])  [phone] (mobile)     Buccola,Allan C 01/30/2017, 2:51 PM

## 2017-01-30 NOTE — Addendum Note (Signed)
Addendum  created 01/30/17 0807 by Franco NonesYates, Joley Utecht S, CRNA   Sign clinical note

## 2017-01-30 NOTE — Progress Notes (Signed)
Foley cath removed post op day one per MD order.  Patient tolerated well.

## 2017-01-31 LAB — CBC
HCT: 38.1 % (ref 36.0–46.0)
Hemoglobin: 12.5 g/dL (ref 12.0–15.0)
MCH: 29.2 pg (ref 26.0–34.0)
MCHC: 32.8 g/dL (ref 30.0–36.0)
MCV: 89 fL (ref 78.0–100.0)
PLATELETS: 187 10*3/uL (ref 150–400)
RBC: 4.28 MIL/uL (ref 3.87–5.11)
RDW: 13.6 % (ref 11.5–15.5)
WBC: 15.7 10*3/uL — AB (ref 4.0–10.5)

## 2017-01-31 MED ORDER — METHOCARBAMOL 500 MG PO TABS: 500.0000 mg | ORAL_TABLET | Freq: Four times a day (QID) | ORAL | 1 refills | Status: DC | PRN

## 2017-01-31 MED ORDER — ASPIRIN 325 MG PO TBEC: 325.0000 mg | DELAYED_RELEASE_TABLET | Freq: Every day | ORAL | 0 refills | Status: DC

## 2017-01-31 MED ORDER — BISACODYL 5 MG PO TBEC: 5.0000 mg | DELAYED_RELEASE_TABLET | Freq: Every day | ORAL | 0 refills | Status: DC | PRN

## 2017-01-31 MED ORDER — HYDROCODONE-ACETAMINOPHEN 7.5-325 MG PO TABS: 1.0000 | ORAL_TABLET | Freq: Four times a day (QID) | ORAL | 0 refills | Status: DC

## 2017-01-31 NOTE — Progress Notes (Signed)
Pt IV removed per NT, tolerated well.  Reviewed discharge instructions with pt and answered all questions at this time. 

## 2017-01-31 NOTE — Progress Notes (Signed)
Physical Therapy Treatment Patient Details Name: [name] MRN: [id number] DOB: Aug 10, 1966 Today's Date: 01/31/2017    History of Present Illness Yohanna Tow is a 51yo white female who comes to [facility] for elevtive Left TKA on 5/10.     PT Comments    Patient received supine in bed today, pleasant and willing to work with PT, stating some anxiety about stair navigation at home. Able to perform all mobility with S-Mod(I) and occasional cues for safety mostly with sit to stand transfer. Able to ambulate well in hallway with Mod(I) and rolling walker. Introduced IT trainer today first with demonstration and then min guard with B railings and proper form; patient able to perform and tolerate this well today. Noted ROM to be approximately  8 to 70 degrees this session. Placed CPM on patient, ensuring comfort in device, before increasing CPM ROM to -10 to 70 degrees today; noted some discomfort with increased ROM however patient reports this is tolerable and was educated on how to stop CPM machine if it becomes intolerable, educated to remain in CPM until her transportation home arrives this afternoon. Patient left in CPM with all needs otherwise met this session and patient stating she is not ready for pain medication yet however she will page RN when she feels ready.    Follow Up Recommendations  Home health PT     Equipment Recommendations  None recommended by PT    Recommendations for Other Services       Precautions / Restrictions Precautions Precautions: Fall Precaution Comments: due to surgery Restrictions Weight Bearing Restrictions: Yes LLE Weight Bearing: Weight bearing as tolerated    Mobility  Bed Mobility Overal bed mobility: Modified Independent Bed Mobility: Supine to Sit;Sit to Supine     Supine to sit: Modified independent (Device/Increase time) Sit to supine: Modified independent (Device/Increase time)      Transfers Overall transfer level: Needs  assistance Equipment used: Rolling walker (2 wheeled) Transfers: Sit to/from Stand Sit to Stand: Supervision         General transfer comment: cues for safety   Ambulation/Gait Ambulation/Gait assistance: Modified independent (Device/Increase time) Ambulation Distance (Feet): 160 Feet Assistive device: Rolling walker (2 wheeled) Gait Pattern/deviations: Step-to pattern;Trunk flexed;Antalgic     General Gait Details: Able to complete without rest break    Stairs      Stair navigation: 4 standard steps with B railings, min guard, step to pattern with correct form and min cues following demonstration by PT.       Wheelchair Mobility    Modified Rankin (Stroke Patients Only)       Balance Overall balance assessment: No apparent balance deficits (not formally assessed)                                          Cognition Arousal/Alertness: Awake/alert Behavior During Therapy: WFL for tasks assessed/performed Overall Cognitive Status: Within Functional Limits for tasks assessed                                        Exercises      General Comments        Pertinent Vitals/Pain Pain Assessment: 0-10 Pain Score: 8  Pain Location: L knee Pain Descriptors / Indicators: Aching;Throbbing Pain Intervention(s): Limited activity within patient's tolerance;Monitored during  session    Home Living                      Prior Function            PT Goals (current goals can now be found in the care plan section) Acute Rehab PT Goals Patient Stated Goal: To go home.  PT Goal Formulation: With patient Time For Goal Achievement: 02/05/17 Potential to Achieve Goals: Good Progress towards PT goals: Progressing toward goals    Frequency    BID      PT Plan Current plan remains appropriate    Co-evaluation              AM-PAC PT "6 Clicks" Daily Activity  Outcome Measure  Difficulty turning over in bed  (including adjusting bedclothes, sheets and blankets)?: None Difficulty moving from lying on back to sitting on the side of the bed? : None Difficulty sitting down on and standing up from a chair with arms (e.g., wheelchair, bedside commode, etc,.)?: None Help needed moving to and from a bed to chair (including a wheelchair)?: None Help needed walking in hospital room?: None Help needed climbing 3-5 steps with a railing? : A Little 6 Click Score: 23    End of Session Equipment Utilized During Treatment: Gait belt Activity Tolerance: Patient tolerated treatment well Patient left: in bed;with call bell/phone within reach;in CPM   PT Visit Diagnosis: Other abnormalities of gait and mobility (R26.89);Muscle weakness (generalized) (M62.81);Pain Pain - Right/Left: Left Pain - part of body: Knee     Time: 1005-1030 PT Time Calculation (min) (ACUTE ONLY): 25 min  Charges:  $Gait Training: 8-22 mins $Self Care/Home Management: 8-22                    G Codes:      Deniece Ree PT, DPT [phone]

## 2017-01-31 NOTE — Discharge Summary (Signed)
Physician Discharge Summary  Patient ID: CIRA DEYOE MRN: [id number] DOB/AGE: 04-05-1966 51 y.o.  Admit date: 01/29/2017 Discharge date: 01/31/2017  Admission Diagnoses: primary osteoarthritis left knee  Discharge Diagnoses: same Active Problems:   Primary osteoarthritis of left knee   Discharged Condition: good  Hospital Course:  HD 1 LEFT TKA DEPUY FB PS 12.5 POLY 79F 3T 38 P, SPINAL ANESTHESIA, NORMAL COURSE  HD 2 STARTED PT DID WELL FLEXION 65 GAIT 100 FT  HD 3  BP 136/72 (BP Location: Left Arm)   Pulse 70   Temp 97.6 F (36.4 C) (Oral)   Resp 15   Ht 5\' 3"  (1.6 m)   Wt 223 lb (101.2 kg)   LMP 04/14/2011   SpO2 100%   BMI 39.50 kg/m   Physical Exam  Constitutional: She is oriented to person, place, and time. No distress.  HENT:  Head: Normocephalic.  Eyes: Conjunctivae and EOM are normal. Pupils are equal, round, and reactive to light.  Cardiovascular: Normal rate, regular rhythm and intact distal pulses.   Pulmonary/Chest: Effort normal.  Abdominal: She exhibits no distension.  Musculoskeletal:  CALF SOFT NO PERIPHERAL EDEMA, INCISION W/O SIGNS OF INFECTION  Neurological: She is alert and oriented to person, place, and time. No sensory deficit. She exhibits normal muscle tone. Coordination normal.  Skin: Skin is warm and dry. She is not diaphoretic.  Psychiatric: She has a normal mood and affect. Her behavior is normal.    Disposition: 01-Home or Self Care  Discharge Instructions    CPM    Complete by:  As directed    Continuous passive motion machine (CPM):     Start 0-65 increase 10 per day as tolerated     Use for 2 weeks     Use 6 hrs per day divide time per your choice   Call MD / Call 911    Complete by:  As directed    If you experience chest pain or shortness of breath, CALL 911 and be transported to the hospital emergency room.  If you develope a fever above 101 F, pus (white drainage) or increased drainage or redness at the wound, or calf pain,  call your surgeon's office.   Change dressing    Complete by:  As directed    Do not change dressing   Constipation Prevention    Complete by:  As directed    Drink plenty of fluids.  Prune juice may be helpful.  You may use a stool softener, such as Colace (over the counter) 100 mg twice a day.  Use MiraLax (over the counter) for constipation as needed.   Diet - low sodium heart healthy    Complete by:  As directed    Do not put a pillow under the knee. Place it under the heel.    Complete by:  As directed    Driving restrictions    Complete by:  As directed    No driving for 2 weeks   Increase activity slowly as tolerated    Complete by:  As directed    TED hose    Complete by:  As directed    Use stockings (TED hose) for 2 weeks on both leg(s).  You may remove them at night for sleeping.     Allergies as of 01/31/2017      Reactions   Penicillins Other (See Comments)   Yeast infection Has patient had a PCN reaction causing immediate rash, facial/tongue/throat swelling, SOB or lightheadedness  with hypotension: No Has patient had a PCN reaction causing severe rash involving mucus membranes or skin necrosis: No Has patient had a PCN reaction that required hospitalization No Has patient had a PCN reaction occurring within the last 10 years: No If all of the above answers are "NO", then may proceed with Cephalosporin use.      Medication List    STOP taking these medications   HYDROcodone-acetaminophen 5-325 MG tablet Commonly known as:  NORCO/VICODIN Replaced by:  HYDROcodone-acetaminophen 7.5-325 MG tablet     TAKE these medications   aspirin 325 MG EC tablet Take 1 tablet (325 mg total) by mouth daily with breakfast. Start taking on:  02/01/2017   bisacodyl 5 MG EC tablet Commonly known as:  DULCOLAX Take 1 tablet (5 mg total) by mouth daily as needed for moderate constipation.   budesonide-formoterol 80-4.5 MCG/ACT inhaler Commonly known as:  SYMBICORT Inhale 2  puffs into the lungs 2 (two) times daily.   clobetasol cream 0.05 % Commonly known as:  TEMOVATE Apply 1 application topically 2 (two) times daily as needed.   cyanocobalamin 500 MCG tablet Take 500 mcg by mouth daily.   diclofenac 75 MG EC tablet Commonly known as:  VOLTAREN Take 75 mg by mouth 2 (two) times daily.   hydrochlorothiazide 25 MG tablet Commonly known as:  HYDRODIURIL Take 25 mg by mouth daily.   HYDROcodone-acetaminophen 7.5-325 MG tablet Commonly known as:  NORCO Take 1 tablet by mouth every 6 (six) hours. Replaces:  HYDROcodone-acetaminophen 5-325 MG tablet   levothyroxine 25 MCG tablet Commonly known as:  SYNTHROID, LEVOTHROID Take 25 mcg by mouth daily before breakfast.   methocarbamol 500 MG tablet Commonly known as:  ROBAXIN Take 1 tablet (500 mg total) by mouth every 6 (six) hours as needed for muscle spasms.   POT BICARB-POT CHLORIDE PO Take 250 mg by mouth.   potassium chloride SA 20 MEQ tablet Commonly known as:  K-DUR,KLOR-CON Take 2 tablets (40 mEq total) by mouth 3 (three) times daily.   pravastatin 20 MG tablet Commonly known as:  PRAVACHOL Take 20 mg by mouth every evening.   TURMERIC PO Take 1 tablet by mouth daily.            Durable Medical Equipment        Start     Ordered   01/29/17 1445  For home use only DME Continuous passive motion machine  Once     01/29/17 1445       Signed: Fuller CanadaStanley Louis Gaw 01/31/2017, 8:21 AM

## 2017-02-01 DIAGNOSIS — I10 Essential (primary) hypertension: Secondary | ICD-10-CM | POA: Diagnosis not present

## 2017-02-01 DIAGNOSIS — J449 Chronic obstructive pulmonary disease, unspecified: Secondary | ICD-10-CM | POA: Diagnosis not present

## 2017-02-01 DIAGNOSIS — Z79891 Long term (current) use of opiate analgesic: Secondary | ICD-10-CM | POA: Diagnosis not present

## 2017-02-01 DIAGNOSIS — Z7982 Long term (current) use of aspirin: Secondary | ICD-10-CM | POA: Diagnosis not present

## 2017-02-01 DIAGNOSIS — Z96652 Presence of left artificial knee joint: Secondary | ICD-10-CM | POA: Diagnosis not present

## 2017-02-01 DIAGNOSIS — Z471 Aftercare following joint replacement surgery: Secondary | ICD-10-CM | POA: Diagnosis not present

## 2017-02-02 ENCOUNTER — Encounter (HOSPITAL_COMMUNITY): Payer: Self-pay | Admitting: Orthopedic Surgery

## 2017-02-03 ENCOUNTER — Telehealth: Payer: Self-pay | Admitting: Orthopedic Surgery

## 2017-02-03 DIAGNOSIS — Z471 Aftercare following joint replacement surgery: Secondary | ICD-10-CM | POA: Diagnosis not present

## 2017-02-03 DIAGNOSIS — J449 Chronic obstructive pulmonary disease, unspecified: Secondary | ICD-10-CM | POA: Diagnosis not present

## 2017-02-03 DIAGNOSIS — Z7982 Long term (current) use of aspirin: Secondary | ICD-10-CM | POA: Diagnosis not present

## 2017-02-03 DIAGNOSIS — Z96652 Presence of left artificial knee joint: Secondary | ICD-10-CM | POA: Diagnosis not present

## 2017-02-03 DIAGNOSIS — Z79891 Long term (current) use of opiate analgesic: Secondary | ICD-10-CM | POA: Diagnosis not present

## 2017-02-03 DIAGNOSIS — I10 Essential (primary) hypertension: Secondary | ICD-10-CM | POA: Diagnosis not present

## 2017-02-03 NOTE — Telephone Encounter (Signed)
ROUTING TO DR HARRISON TO ADVISE 

## 2017-02-03 NOTE — Telephone Encounter (Signed)
Patient states that the Hydrocodone-Acetaminophen  7.5/325 mg that Dr. Romeo AppleHarrison prescribed is not helping with her pain and was wondering if he would give her something else.  Please advise

## 2017-02-03 NOTE — Telephone Encounter (Signed)
PATIENT AWARE

## 2017-02-03 NOTE — Telephone Encounter (Signed)
NO USE ALTERNATE MEDICATIONS AND INTERVENTIONS  ICE THE KNEE MORE OFTEN   TAKE 800 MG OF IBUPROFEN Q 6 HOURS FOR 3 DAYS

## 2017-02-05 DIAGNOSIS — J449 Chronic obstructive pulmonary disease, unspecified: Secondary | ICD-10-CM | POA: Diagnosis not present

## 2017-02-05 DIAGNOSIS — Z7982 Long term (current) use of aspirin: Secondary | ICD-10-CM | POA: Diagnosis not present

## 2017-02-05 DIAGNOSIS — I10 Essential (primary) hypertension: Secondary | ICD-10-CM | POA: Diagnosis not present

## 2017-02-05 DIAGNOSIS — Z96652 Presence of left artificial knee joint: Secondary | ICD-10-CM | POA: Diagnosis not present

## 2017-02-05 DIAGNOSIS — Z471 Aftercare following joint replacement surgery: Secondary | ICD-10-CM | POA: Diagnosis not present

## 2017-02-05 DIAGNOSIS — Z79891 Long term (current) use of opiate analgesic: Secondary | ICD-10-CM | POA: Diagnosis not present

## 2017-02-05 LAB — TYPE AND SCREEN
ABO/RH(D): O POS
Antibody Screen: NEGATIVE
UNIT DIVISION: 0
Unit division: 0

## 2017-02-05 LAB — BPAM RBC
BLOOD PRODUCT EXPIRATION DATE: 201805242359
Blood Product Expiration Date: 201805242359
UNIT TYPE AND RH: 5100
UNIT TYPE AND RH: 5100

## 2017-02-09 DIAGNOSIS — Z79891 Long term (current) use of opiate analgesic: Secondary | ICD-10-CM | POA: Diagnosis not present

## 2017-02-09 DIAGNOSIS — J449 Chronic obstructive pulmonary disease, unspecified: Secondary | ICD-10-CM | POA: Diagnosis not present

## 2017-02-09 DIAGNOSIS — Z7982 Long term (current) use of aspirin: Secondary | ICD-10-CM | POA: Diagnosis not present

## 2017-02-09 DIAGNOSIS — Z96652 Presence of left artificial knee joint: Secondary | ICD-10-CM | POA: Diagnosis not present

## 2017-02-09 DIAGNOSIS — Z471 Aftercare following joint replacement surgery: Secondary | ICD-10-CM | POA: Diagnosis not present

## 2017-02-09 DIAGNOSIS — I10 Essential (primary) hypertension: Secondary | ICD-10-CM | POA: Diagnosis not present

## 2017-02-11 DIAGNOSIS — Z7982 Long term (current) use of aspirin: Secondary | ICD-10-CM | POA: Diagnosis not present

## 2017-02-11 DIAGNOSIS — I10 Essential (primary) hypertension: Secondary | ICD-10-CM | POA: Diagnosis not present

## 2017-02-11 DIAGNOSIS — Z471 Aftercare following joint replacement surgery: Secondary | ICD-10-CM | POA: Diagnosis not present

## 2017-02-11 DIAGNOSIS — J449 Chronic obstructive pulmonary disease, unspecified: Secondary | ICD-10-CM | POA: Diagnosis not present

## 2017-02-11 DIAGNOSIS — Z79891 Long term (current) use of opiate analgesic: Secondary | ICD-10-CM | POA: Diagnosis not present

## 2017-02-11 DIAGNOSIS — Z96652 Presence of left artificial knee joint: Secondary | ICD-10-CM | POA: Diagnosis not present

## 2017-02-13 ENCOUNTER — Ambulatory Visit (INDEPENDENT_AMBULATORY_CARE_PROVIDER_SITE_OTHER): Payer: BLUE CROSS/BLUE SHIELD | Admitting: Orthopedic Surgery

## 2017-02-13 ENCOUNTER — Encounter: Payer: Self-pay | Admitting: Orthopedic Surgery

## 2017-02-13 DIAGNOSIS — Z96652 Presence of left artificial knee joint: Secondary | ICD-10-CM

## 2017-02-13 DIAGNOSIS — I10 Essential (primary) hypertension: Secondary | ICD-10-CM | POA: Diagnosis not present

## 2017-02-13 DIAGNOSIS — J449 Chronic obstructive pulmonary disease, unspecified: Secondary | ICD-10-CM | POA: Diagnosis not present

## 2017-02-13 DIAGNOSIS — Z7982 Long term (current) use of aspirin: Secondary | ICD-10-CM | POA: Diagnosis not present

## 2017-02-13 DIAGNOSIS — Z4889 Encounter for other specified surgical aftercare: Secondary | ICD-10-CM

## 2017-02-13 DIAGNOSIS — Z471 Aftercare following joint replacement surgery: Secondary | ICD-10-CM | POA: Diagnosis not present

## 2017-02-13 DIAGNOSIS — Z79891 Long term (current) use of opiate analgesic: Secondary | ICD-10-CM | POA: Diagnosis not present

## 2017-02-13 NOTE — Progress Notes (Signed)
FOLLOW UP FOR  LEFT TKA  Chief Complaint  Patient presents with  . Follow-up    post op 1, LT TKA, DOS 01/29/17    The patient is doing very well. The pain is controlled with Hydrocodone  The surgical site is clean dry and intact  We were able to remove the staples today The calf is supple nontender the Homans sign is normal  DVT prevention aspirin 325 mg twice a day with bilateral TED hose  CPM machine at home  Home physical therapy   Assessment and plan: Stable, continue physical therapy return 2-4 weeks Start physical therapy as outpatient when home therapy completed

## 2017-02-13 NOTE — Addendum Note (Signed)
Addended by: Diamantina MonksBOOTHE, JAIME B on: 02/13/2017 08:57 AM   Modules accepted: Orders

## 2017-02-18 ENCOUNTER — Ambulatory Visit (HOSPITAL_COMMUNITY): Payer: BLUE CROSS/BLUE SHIELD | Attending: Orthopedic Surgery | Admitting: Physical Therapy

## 2017-02-18 DIAGNOSIS — R2689 Other abnormalities of gait and mobility: Secondary | ICD-10-CM | POA: Diagnosis not present

## 2017-02-18 DIAGNOSIS — M25662 Stiffness of left knee, not elsewhere classified: Secondary | ICD-10-CM | POA: Diagnosis not present

## 2017-02-18 DIAGNOSIS — M6281 Muscle weakness (generalized): Secondary | ICD-10-CM | POA: Insufficient documentation

## 2017-02-18 DIAGNOSIS — M25562 Pain in left knee: Secondary | ICD-10-CM

## 2017-02-18 NOTE — Therapy (Signed)
[facility] [address] [place], [place], [address] Phone: [phone]   Fax:  [phone]  Physical Therapy Evaluation  Patient Details  Name: [name] MRN: [id number] Date of Birth: [birth year] Referring Provider: [name]   Encounter Date: 02/18/2017      PT End of Session - 02/18/17 1320    Visit Number 1   Number of Visits 18   Date for PT Re-Evaluation 03/20/17   Authorization Type BCBS   Authorization - Visit Number 1   Authorization - Number of Visits 18   PT Start Time 1305   PT Stop Time 1345   PT Time Calculation (min) 40 min   Activity Tolerance Patient tolerated treatment well   Behavior During Therapy [facility] for tasks assessed/performed      Past Medical History:  Diagnosis Date  . Arthritis   . Back pain 10/30/2015  . Bilateral shoulder pain 10/30/2015  . COPD (chronic obstructive pulmonary disease) (HCC)   . Fecal occult blood test positive 10/30/2015  . Hemorrhoids 10/30/2015  . Hypertension   . Hypothyroidism   . Large breasts 10/30/2015   44 DD  . Postmenopausal 10/30/2015  . Sleep apnea    pt DX but did not want to use CPAP.  Marland Kitchen Thyroid disease     Past Surgical History:  Procedure Laterality Date  . COLONOSCOPY N/A 11/12/2015   Procedure: COLONOSCOPY;  Surgeon: West Bali, MD;  Location: [facility];  Service: Endoscopy;  Laterality: N/A;  8:30 AM  . KNEE ARTHROSCOPY WITH MEDIAL MENISECTOMY Left 05/04/2015   Procedure: KNEE ARTHROSCOPY WITH MEDIAL AND LATERAL MENISECTOMY;  Surgeon: Vickki Hearing, MD;  Location: [facility];  Service: Orthopedics;  Laterality: Left;  . NASAL SINUS SURGERY    . TOTAL KNEE ARTHROPLASTY Left 01/29/2017   Procedure: TOTAL KNEE ARTHROPLASTY;  Surgeon: Vickki Hearing, MD;  Location: [facility];  Service: Orthopedics;  Laterality: Left;    There were no vitals filed for this visit.       Subjective Assessment - 02/18/17 1311    Subjective [name] states that she  has had pain in her left knee for years therefore she opted to have a TKR.  Her replacement was on 01/28/2017 and was discharged from the hospital on the 12th.  She has been recieving home health therapy and now is being referred to outpatient services.  She is still having trouble bending her knee, standing in one place and walking.   She is icing around 3 tims a day.     Pertinent History ; OA in right knee    Limitations Sitting;Lifting;Standing;Walking;House hold activities   How long can you sit comfortably? 30 minutes    How long can you stand comfortably? less than five minutes    How long can you walk comfortably? walking with a cane now she has walked as long 10 minutes    Patient Stated Goals sleep better, walk better, stand longer    Currently in Pain? Yes   Pain Score 5   least 0; worst is an 8    Pain Location Knee   Pain Orientation Left   Pain Descriptors / Indicators Dull;Aching;Throbbing   Pain Type Acute pain   Pain Onset 1 to 4 weeks ago   Pain Frequency Intermittent   Aggravating Factors  bending    Pain Relieving Factors ice    Effect of Pain on Daily Activities increases  [facility] PT Assessment - 02/18/17 0001      Assessment   Medical Diagnosis Lt TKR   Referring Provider [name]    Onset Date/Surgical Date 01/28/17   Prior Therapy acute and home health      Precautions   Precautions None     Restrictions   Weight Bearing Restrictions No     Balance Screen   Has the patient fallen in the past 6 months No   Has the patient had a decrease in activity level because of a fear of falling?  Yes   Is the patient reluctant to leave their home because of a fear of falling?  Yes     Home Environment   Type of Home Mobile home   Home Access Stairs to enter   Entrance Stairs-Number of Steps 4   Entrance Stairs-Rails Right;Left     Prior Function   Level of Independence Independent   Vocation Full time employment   Vocation Requirements  CNA- 8 hours, lots of walking and lifting      Cognition   Overall Cognitive Status Within Functional Limits for tasks assessed     Observation/Other Assessments   Focus on Therapeutic Outcomes (FOTO)  42     Observation/Other Assessments-Edema    Edema Circumferential     Circumferential Edema   Circumferential - Right 44.5   Circumferential - Left  48     Functional Tests   Functional tests Single leg stance     Single Leg Stance   Comments Rt 20 : Lt 23      ROM / Strength   AROM / PROM / Strength AROM;Strength     AROM   Right/Left Knee Left   Left Knee Extension 5   Left Knee Flexion 74     Strength   Right/Left Hip Right;Left   Left Hip Flexion 4-/5   Left Hip Extension 3+/5   Left Hip ABduction 3+/5   Right/Left Knee Right;Left   Right Knee Extension 4-/5   Left Knee Flexion 3-/5   Left Knee Extension 3/5   Right/Left Ankle Left   Right Ankle Dorsiflexion 5/5   Left Ankle Dorsiflexion 5/5            Objective measurements completed on examination: See above findings.          [facility] Adult PT Treatment/Exercise - 02/18/17 0001      Exercises   Exercises Knee/Hip     Knee/Hip Exercises: Stretches   Active Hamstring Stretch Left;3 reps;30 seconds     Knee/Hip Exercises: Standing   Heel Raises Both;10 reps     Knee/Hip Exercises: Supine   Quad Sets Left;5 reps   Heel Slides Left;5 reps   Straight Leg Raises Left;5 reps     Knee/Hip Exercises: Sidelying   Hip ABduction Left;5 reps     Knee/Hip Exercises: Prone   Hamstring Curl 5 reps   Hip Extension Right;Left;5 reps                PT Education - 02/18/17 1334    Education provided Yes   Education Details To complete exercises on her right knee as well    Person(s) Educated Patient   Methods Explanation   Comprehension Verbalized understanding          PT Short Term Goals - 02/18/17 1326      PT SHORT TERM GOAL #1   Title Pt left knee flexion to be to 95 degrees  to allow  pt to sit for 45 minutes in comfort to eat at a restaurant.    Time 3   Period Weeks   Status New     PT SHORT TERM GOAL #2   Title Pt to be walking inside without a cane.   Time 3   Period Weeks   Status New     PT SHORT TERM GOAL #3   Title PT pain in her left knee to be no greater than a 5/10 to allow pt to be able to stand for 15 minutes at a time to be able to shower.    Time 3   Period Weeks     PT SHORT TERM GOAL #4   Title Pt to be able to walk for 20 minutes without increased left knee pain to be able to complete short shopping trips.    Time 3   Period Weeks   Status New     PT SHORT TERM GOAL #5   Title Pt to be waking only one time a night    Time 3   Period Weeks   Status New           PT Long Term Goals - 02/18/17 1344      PT LONG TERM GOAL #1   Title Pt  left knee pain level to be no greater than a 2/10 to allow pt to be able to walk for an hour without difficulty.    Time 6   Period Weeks   Status New     PT LONG TERM GOAL #2   Title Pt to be walking without any assistive device both in and outside.    Time 6   Period Weeks   Status New     PT LONG TERM GOAL #3   Title Pt left knee extension to be no greater than a 2 to allow normalized gait.    Time 6   Period Weeks   Status New     PT LONG TERM GOAL #4   Title Pt left knee flexion to be at least 115 to be able to squat to pick items off the floor.    Time 6   Period Weeks   Status New     PT LONG TERM GOAL #5   Title Pt left LE strength to be increased by a grade to allow pt to come sit to stand without difficulty and to go up 4 steps in a reciprocal manner.    Time 6   Period Weeks   Status New     Additional Long Term Goals   Additional Long Term Goals Yes     PT LONG TERM GOAL #6   Title PT  to be sleeping throughout the night    Time 6   Period Weeks   Status New                Plan - 02/18/17 1336    Clinical Impression Statement [name] is a [age] yo  female who has had a recent left total knee replacement.  She was discharged from home health services last week and is now being referred to out-patient therapy to maximize her functional activity.  Examination demonstrates increased swelling, increased pain, decreased ROM, decreased strength, decreased activity tolerance and abnormal gait.  [name] will benefit from skilled physical therapy to address these areas and maximize her functional abilily.     History and Personal Factors relevant to plan of care: OA  in Rt knee,    Clinical Presentation Stable   Clinical Decision Making Moderate   Rehab Potential Good   PT Frequency 3x / week   PT Duration 6 weeks   PT Treatment/Interventions ADLs/Self Care Home Management;Cryotherapy;Electrical Stimulation;Gait training;Stair training;Functional mobility training;Therapeutic activities;Therapeutic exercise;Balance training;Patient/family education;Manual techniques   PT Next Visit Plan Begin manual therapy for edema control.  Focus on ROM to include standing flexion, standing knee drivers, standing terminal extension, supine terminal extension and gentle PROM    PT Home Exercise Plan heel raises; squats, quad sets, heel slides, hamstring stretch, hip flexion,abduction and extension.       Patient will benefit from skilled therapeutic intervention in order to improve the following deficits and impairments:  Abnormal gait, Decreased activity tolerance, Decreased balance, Decreased strength, Difficulty walking, Pain, Increased edema, Decreased range of motion  Visit Diagnosis: Stiffness of left knee  Acute pain of left knee  Muscle weakness (generalized)  Other abnormalities of gait and mobility      G-Codes - Feb 23, 2017 1349    Functional Assessment Tool Used (Outpatient Only) foto   Functional Limitation Mobility: Walking and moving around   Mobility: Walking and Moving Around Current Status [phone]) At least 40 percent but less than 60  percent impaired, limited or restricted   Mobility: Walking and Moving Around Goal Status [phone]) At least 40 percent but less than 60 percent impaired, limited or restricted       Problem List Patient Active Problem List   Diagnosis Date Noted  . Primary osteoarthritis of left knee   . Special screening for malignant neoplasms, colon   . Hemorrhoids 10/30/2015  . Fecal occult blood test positive 10/30/2015  . Postmenopausal 10/30/2015  . Large breasts 10/30/2015  . Bilateral shoulder pain 10/30/2015  . Back pain 10/30/2015  . Medial meniscus tear   . Lateral meniscus tear, current   . Erosive lichen planus of vulva 08/28/2014  . 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 Right hip flexor tightness 12/06/2013  . Plantar fasciitis 12/06/2013  Virgina Organ, PT CLT [phone] 02/23/17, 1:50 PM  [facility] [address] [place], [place], [address] Phone: [phone]   Fax:  [phone]  Name: [name] MRN: [id number] Date of Birth: 02/18/1966

## 2017-02-18 NOTE — Patient Instructions (Addendum)
Strengthening: Quadriceps Set    Tighten muscles on top of thighs by pushing knees down into surface. Hold ___3_ seconds. Repeat _10___ times per set. Do __1__ sets per session. Do ___3_ sessions per day.  http://orth.exer.us/602   Copyright  VHI. All rights reserved.  Self-Mobilization: Heel Slide (Supine)    Slide left heel toward buttocks until a gentle stretch is felt. Hold _3-5___ seconds. Relax. Repeat 10____ times per set. Do __1__ sets per session. Do _2___ sessions per day.  http://orth.exer.us/710   Copyright  VHI. All rights reserved.  Stretching: Hamstring (Supine)    Supporting left   thigh behind knee, slowly straighten knee until stretch is felt in back of thigh. Hold __30__ seconds. Repeat __3__ times per set. Do _1___ sets per session. Do _3___ sessions per day.  http://orth.exer.us/656   Copyright  VHI. All rights reserved.  Strengthening: Terminal Knee Extension (Supine)    With left knee over bolster, straighten knee by tightening muscles on top of thigh. Keep bottom of knee on bolster. Repeat _10___ times per set. Do ___1_ sets per session. Do __2__ sessions per day.  http://orth.exer.us/626   Copyright  VHI. All rights reserved.  Strengthening: Hip Abduction (Side-Lying)    Tighten muscles on front of left thigh, then lift leg 15____ inches from surface, keeping knee locked.  Repeat __10__ times per set. Do ___1_ sets per session. Do __2__ sessions per day.  http://orth.exer.us/622   Copyright  VHI. All rights reserved.  Functional Quadriceps: Chair Squat    Keeping feet flat on floor, shoulder width apart, squat as low as is comfortable. Use support as necessary. Repeat _10___ times per set. Do __1__ sets per session. Do ___3_ sessions per day.  http://orth.exer.us/736   Copyright  VHI. All rights reserved.  Heel Raise: Bilateral (Standing)    Rise on balls of feet. Repeat _10___ times per set. Do _1___ sets per session. Do  _2___ sessions per day.  http://orth.exer.us/38   Copyright  VHI. All rights reserved.  Self-Mobilization: Knee Flexion (Prone)    Bring left heel toward buttocks as close as possible. Hold _3___ seconds. Relax. Repeat __10__ times per set. Do ___1_ sets per session. Do _2___ sessions per day.  http://orth.exer.us/596   Copyright  VHI. All rights reserved.

## 2017-02-24 ENCOUNTER — Ambulatory Visit (HOSPITAL_COMMUNITY): Payer: BLUE CROSS/BLUE SHIELD | Attending: Orthopedic Surgery | Admitting: Physical Therapy

## 2017-02-24 DIAGNOSIS — M6281 Muscle weakness (generalized): Secondary | ICD-10-CM

## 2017-02-24 DIAGNOSIS — M25662 Stiffness of left knee, not elsewhere classified: Secondary | ICD-10-CM | POA: Insufficient documentation

## 2017-02-24 DIAGNOSIS — R2689 Other abnormalities of gait and mobility: Secondary | ICD-10-CM | POA: Diagnosis not present

## 2017-02-24 DIAGNOSIS — M25562 Pain in left knee: Secondary | ICD-10-CM | POA: Insufficient documentation

## 2017-02-24 NOTE — Therapy (Addendum)
[facility] [address] [place], [place], [address] Phone: [phone]   Fax:  [phone]  Physical Therapy Treatment  Patient Details  Name: [name] MRN: [id number] Date of Birth: 1966-05-04 Referring Provider: [name]   Encounter Date: 02/24/2017      PT End of Session - 02/24/17 1019    Visit Number 2   Number of Visits 18   Date for PT Re-Evaluation 03/20/17   Authorization Type BCBS   Authorization - Visit Number 2   Authorization - Number of Visits 18   PT Start Time 0818   PT Stop Time 0904   PT Time Calculation (min) 46 min   Activity Tolerance Patient tolerated treatment well   Behavior During Therapy [facility] for tasks assessed/performed      Past Medical History:  Diagnosis Date  . Arthritis   . Back pain 10/30/2015  . Bilateral shoulder pain 10/30/2015  . COPD (chronic obstructive pulmonary disease) (HCC)   . Fecal occult blood test positive 10/30/2015  . Hemorrhoids 10/30/2015  . Hypertension   . Hypothyroidism   . Large breasts 10/30/2015   44 DD  . Postmenopausal 10/30/2015  . Sleep apnea    pt DX but did not want to use CPAP.  Marland Kitchen Thyroid disease     Past Surgical History:  Procedure Laterality Date  . COLONOSCOPY N/A 11/12/2015   Procedure: COLONOSCOPY;  Surgeon: West Bali, MD;  [facility];  Service: Endoscopy;  Laterality: N/A;  8:30 AM  . KNEE ARTHROSCOPY WITH MEDIAL MENISECTOMY Left 05/04/2015   Procedure: KNEE ARTHROSCOPY WITH MEDIAL AND LATERAL MENISECTOMY;  Surgeon: Vickki Hearing, MD;  Location: [facility];  Service: Orthopedics;  Laterality: Left;  . NASAL SINUS SURGERY    . TOTAL KNEE ARTHROPLASTY Left 01/29/2017   Procedure: TOTAL KNEE ARTHROPLASTY;  Surgeon: Vickki Hearing, MD;  Location: [facility];  Service: Orthopedics;  Laterality: Left;    There were no vitals filed for this visit.      Subjective Assessment - 02/24/17 1316    Subjective Pt states she is doing OK  today, reports compliance with HEP.  Pain is currently 4/10 in her LT knee. states she no longer has steristrips in place.     Currently in Pain? Yes   Pain Score 4    Pain Location Knee   Pain Orientation Left   Pain Descriptors / Indicators Aching;Throbbing   Pain Type Acute pain                         [facility] Adult PT Treatment/Exercise - 02/24/17 0001      Knee/Hip Exercises: Stretches   Active Hamstring Stretch Left;3 reps;30 seconds   Knee: Self-Stretch to increase Flexion Left;10 seconds     Knee/Hip Exercises: Standing   Heel Raises Both;10 reps   Heel Raises Limitations toeraises 10 reps   Knee Flexion Left;10 reps     Knee/Hip Exercises: Supine   Quad Sets Left;10 reps   Short Arc Quad Sets Left;10 reps   Heel Slides Left;10 reps   Straight Leg Raises Left;10 reps   Knee Extension Limitations 3   Knee Flexion Limitations 82     Knee/Hip Exercises: Sidelying   Hip ABduction 10 reps     Knee/Hip Exercises: Prone   Hamstring Curl 10 reps   Hip Extension Left;10 reps     Manual Therapy   Manual Therapy Edema management;Myofascial release  Manual therapy comments completed seperately from all other skilled interventions   Edema Management retro to decrease edema   Myofascial Release to tight fascia perimeter of knee                  PT Short Term Goals - 02/18/17 1326      PT SHORT TERM GOAL #1   Title Pt left knee flexion to be to 95 degrees to allow pt to sit for 45 minutes in comfort to eat at a restaurant.    Time 3   Period Weeks   Status New     PT SHORT TERM GOAL #2   Title Pt to be walking inside without a cane.   Time 3   Period Weeks   Status New     PT SHORT TERM GOAL #3   Title PT pain in her left knee to be no greater than a 5/10 to allow pt to be able to stand for 15 minutes at a time to be able to shower.    Time 3   Period Weeks     PT SHORT TERM GOAL #4   Title Pt to be able to walk for 20 minutes without  increased left knee pain to be able to complete short shopping trips.    Time 3   Period Weeks   Status New     PT SHORT TERM GOAL #5   Title Pt to be waking only one time a night    Time 3   Period Weeks   Status New           PT Long Term Goals - 02/18/17 1344      PT LONG TERM GOAL #1   Title Pt  left knee pain level to be no greater than a 2/10 to allow pt to be able to walk for an hour without difficulty.    Time 6   Period Weeks   Status New     PT LONG TERM GOAL #2   Title Pt to be walking without any assistive device both in and outside.    Time 6   Period Weeks   Status New     PT LONG TERM GOAL #3   Title Pt left knee extension to be no greater than a 2 to allow normalized gait.    Time 6   Period Weeks   Status New     PT LONG TERM GOAL #4   Title Pt left knee flexion to be at least 115 to be able to squat to pick items off the floor.    Time 6   Period Weeks   Status New     PT LONG TERM GOAL #5   Title Pt left LE strength to be increased by a grade to allow pt to come sit to stand without difficulty and to go up 4 steps in a reciprocal manner.    Time 6   Period Weeks   Status New     Additional Long Term Goals   Additional Long Term Goals Yes     PT LONG TERM GOAL #6   Title PT  to be sleeping throughout the night    Time 6   Period Weeks   Status New               Plan - 02/24/17 1023    Clinical Impression Statement Reviewed HEP and intial evaluation goals.  Pt comes today without AD.  Added therex per PT POC and began manual to Lt knee.  Noted adhesions along scar line and edema perimeter of knee.  ROM this session 3-82 degrees.     Rehab Potential Good   PT Frequency 3x / week   PT Duration 6 weeks   PT Treatment/Interventions ADLs/Self Care Home Management;Cryotherapy;Electrical Stimulation;Gait training;Stair training;Functional mobility training;Therapeutic activities;Therapeutic exercise;Balance training;Patient/family  education;Manual techniques   PT Next Visit Plan Focus on ROM primarily and progress funtional strengthening as this improves to WNL. Continue manual as needed.    PT Home Exercise Plan heel raises; squats, quad sets, heel slides, hamstring stretch, hip flexion,abduction and extension.       Patient will benefit from skilled therapeutic intervention in order to improve the following deficits and impairments:  Abnormal gait, Decreased activity tolerance, Decreased balance, Decreased strength, Difficulty walking, Pain, Increased edema, Decreased range of motion  Visit Diagnosis: Stiffness of left knee  Acute pain of left knee  Muscle weakness (generalized)     Problem List Patient Active Problem List   Diagnosis Date Noted  . Primary osteoarthritis of left knee   . Special screening for malignant neoplasms, colon   . Hemorrhoids 10/30/2015  . Fecal occult blood test positive 10/30/2015  . Postmenopausal 10/30/2015  . Large breasts 10/30/2015  . Bilateral shoulder pain 10/30/2015  . Back pain 10/30/2015  . Medial meniscus tear   . Lateral meniscus tear, current   . Erosive lichen planus of vulva 08/28/2014  . 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 Right hip flexor tightness 12/06/2013  . Plantar fasciitis 12/06/2013    Lurena Nidamy B Frazier, PTA/CLT [phone] 02/24/2017, 1:19 PM  [facility] [address] [place], [place], [address] Phone: [phone]   Fax:  [phone]  Name: [name] MRN: [id number] Date of Birth: 03/02/1966

## 2017-02-25 ENCOUNTER — Ambulatory Visit (HOSPITAL_COMMUNITY): Payer: BLUE CROSS/BLUE SHIELD | Admitting: Physical Therapy

## 2017-02-25 DIAGNOSIS — R2689 Other abnormalities of gait and mobility: Secondary | ICD-10-CM | POA: Diagnosis not present

## 2017-02-25 DIAGNOSIS — M25662 Stiffness of left knee, not elsewhere classified: Secondary | ICD-10-CM | POA: Diagnosis not present

## 2017-02-25 DIAGNOSIS — M6281 Muscle weakness (generalized): Secondary | ICD-10-CM | POA: Diagnosis not present

## 2017-02-25 DIAGNOSIS — M25562 Pain in left knee: Secondary | ICD-10-CM

## 2017-02-25 NOTE — Therapy (Signed)
[facility] [address] [place], [place], [address] Phone: [phone]   Fax:  [phone]  Physical Therapy Treatment  Patient Details  Name: [name] MRN: [id number] Date of Birth: 1966-08-15 Referring Provider: [name]   Encounter Date: 02/25/2017      PT End of Session - 02/25/17 0923    Visit Number 3   Number of Visits 18   Date for PT Re-Evaluation 03/20/17   Authorization Type BCBS   Authorization - Visit Number 3   Authorization - Number of Visits 18   PT Start Time 0818   PT Stop Time 0900   PT Time Calculation (min) 42 min   Activity Tolerance Patient tolerated treatment well   Behavior During Therapy [facility] for tasks assessed/performed      Past Medical History:  Diagnosis Date  . Arthritis   . Back pain 10/30/2015  . Bilateral shoulder pain 10/30/2015  . COPD (chronic obstructive pulmonary disease) (HCC)   . Fecal occult blood test positive 10/30/2015  . Hemorrhoids 10/30/2015  . Hypertension   . Hypothyroidism   . Large breasts 10/30/2015   44 DD  . Postmenopausal 10/30/2015  . Sleep apnea    pt DX but did not want to use CPAP.  Marland Kitchen Thyroid disease     Past Surgical History:  Procedure Laterality Date  . COLONOSCOPY N/A 11/12/2015   Procedure: COLONOSCOPY;  Surgeon: West Bali, MD;  [facility];  Service: Endoscopy;  Laterality: N/A;  8:30 AM  . KNEE ARTHROSCOPY WITH MEDIAL MENISECTOMY Left 05/04/2015   Procedure: KNEE ARTHROSCOPY WITH MEDIAL AND LATERAL MENISECTOMY;  Surgeon: Vickki Hearing, MD;  Location: [facility];  Service: Orthopedics;  Laterality: Left;  . NASAL SINUS SURGERY    . TOTAL KNEE ARTHROPLASTY Left 01/29/2017   Procedure: TOTAL KNEE ARTHROPLASTY;  Surgeon: Vickki Hearing, MD;  Location: [facility];  Service: Orthopedics;  Laterality: Left;    There were no vitals filed for this visit.      Subjective Assessment - 02/24/17 1316    Subjective Pt states she is doing OK  today, reports compliance with HEP.  Pain is currently 4/10 in her LT knee. states she no longer has steristrips in place.     Currently in Pain? Yes   Pain Score 4    Pain Location Knee   Pain Orientation Left   Pain Descriptors / Indicators Aching;Throbbing   Pain Type Acute pain                         [facility] Adult PT Treatment/Exercise - 02/25/17 0001      Knee/Hip Exercises: Stretches   Knee: Self-Stretch to increase Flexion Left;10 seconds     Knee/Hip Exercises: Standing   Heel Raises Both;15 reps   Heel Raises Limitations toeraises 15 reps   Knee Flexion Left;15 reps   Forward Lunges Both;10 reps   Forward Lunges Limitations onto 4" box   Functional Squat 10 reps     Knee/Hip Exercises: Seated   Long Arc Quad Left;10 reps   Sit to [employer] 10 reps;without UE support     Knee/Hip Exercises: Supine   Quad Sets Left;10 reps     Knee/Hip Exercises: Sidelying   Hip ABduction 15 reps     Knee/Hip Exercises: Prone   Hamstring Curl 5 reps   Hamstring Curl Limitations then began to c/o pain     Manual Therapy  Manual Therapy Edema management;Myofascial release   Manual therapy comments completed seperately from all other skilled interventions   Edema Management retro to decrease edema   Myofascial Release to tight fascia perimeter of knee                  PT Short Term Goals - 02/18/17 1326      PT SHORT TERM GOAL #1   Title Pt left knee flexion to be to 95 degrees to allow pt to sit for 45 minutes in comfort to eat at a restaurant.    Time 3   Period Weeks   Status New     PT SHORT TERM GOAL #2   Title Pt to be walking inside without a cane.   Time 3   Period Weeks   Status New     PT SHORT TERM GOAL #3   Title PT pain in her left knee to be no greater than a 5/10 to allow pt to be able to stand for 15 minutes at a time to be able to shower.    Time 3   Period Weeks     PT SHORT TERM GOAL #4   Title Pt to be able to walk for 20  minutes without increased left knee pain to be able to complete short shopping trips.    Time 3   Period Weeks   Status New     PT SHORT TERM GOAL #5   Title Pt to be waking only one time a night    Time 3   Period Weeks   Status New           PT Long Term Goals - 02/18/17 1344      PT LONG TERM GOAL #1   Title Pt  left knee pain level to be no greater than a 2/10 to allow pt to be able to walk for an hour without difficulty.    Time 6   Period Weeks   Status New     PT LONG TERM GOAL #2   Title Pt to be walking without any assistive device both in and outside.    Time 6   Period Weeks   Status New     PT LONG TERM GOAL #3   Title Pt left knee extension to be no greater than a 2 to allow normalized gait.    Time 6   Period Weeks   Status New     PT LONG TERM GOAL #4   Title Pt left knee flexion to be at least 115 to be able to squat to pick items off the floor.    Time 6   Period Weeks   Status New     PT LONG TERM GOAL #5   Title Pt left LE strength to be increased by a grade to allow pt to come sit to stand without difficulty and to go up 4 steps in a reciprocal manner.    Time 6   Period Weeks   Status New     Additional Long Term Goals   Additional Long Term Goals Yes     PT LONG TERM GOAL #6   Title PT  to be sleeping throughout the night    Time 6   Period Weeks   Status New               Plan - 02/25/17 1106    Clinical Impression Statement continued with focus on improving ROM  of knee along with some functional strengthening exercises.  .  Added Sit to stand, mini squats and lunges onto 4" riser.  Pt able to complete all these actvities wtihout pain or difficutly.  Wehn transferred to prone, completed 5 reps of hamstring curls then c/o increased pain to the point of becoming tearful.  No injury palpated, only some general tightness in hamstring region.  Returned to supine and completed manual.  Pt with pain at attempts of heelslide after this  so discontnued therex.  Offered icepack, however pateint reported she was going home and would ice there.  Pt reported LE felt better after returning to standing and walking at end of session.     Rehab Potential Good   PT Frequency 3x / week   PT Duration 6 weeks   PT Treatment/Interventions ADLs/Self Care Home Management;Cryotherapy;Electrical Stimulation;Gait training;Stair training;Functional mobility training;Therapeutic activities;Therapeutic exercise;Balance training;Patient/family education;Manual techniques   PT Next Visit Plan Focus on ROM primarily and progress funtional strengthening as this improves to WNL. Continue manual as needed. Resume ROM exercises next session and measure for progress.     PT Home Exercise Plan heel raises; squats, quad sets, heel slides, hamstring stretch, hip flexion,abduction and extension.       Patient will benefit from skilled therapeutic intervention in order to improve the following deficits and impairments:  Abnormal gait, Decreased activity tolerance, Decreased balance, Decreased strength, Difficulty walking, Pain, Increased edema, Decreased range of motion  Visit Diagnosis: Stiffness of left knee  Acute pain of left knee  Muscle weakness (generalized)     Problem List Patient Active Problem List   Diagnosis Date Noted  . Primary osteoarthritis of left knee   . Special screening for malignant neoplasms, colon   . Hemorrhoids 10/30/2015  . Fecal occult blood test positive 10/30/2015  . Postmenopausal 10/30/2015  . Large breasts 10/30/2015  . Bilateral shoulder pain 10/30/2015  . Back pain 10/30/2015  . Medial meniscus tear   . Lateral meniscus tear, current   . Erosive lichen planus of vulva 08/28/2014  . 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 Right hip flexor tightness 12/06/2013  . Plantar fasciitis 12/06/2013    Lurena Nidamy B Lindy Garczynski, PTA/CLT [phone] 02/25/2017, 11:11 AM  [facility] [address] [place], [place], [address] Phone: [phone]   Fax:  [phone]  Name: [name] MRN: [id number] Date of Birth: 03/27/1966

## 2017-02-27 ENCOUNTER — Ambulatory Visit (HOSPITAL_COMMUNITY): Payer: BLUE CROSS/BLUE SHIELD

## 2017-02-27 DIAGNOSIS — M25662 Stiffness of left knee, not elsewhere classified: Secondary | ICD-10-CM

## 2017-02-27 DIAGNOSIS — M6281 Muscle weakness (generalized): Secondary | ICD-10-CM

## 2017-02-27 DIAGNOSIS — R2689 Other abnormalities of gait and mobility: Secondary | ICD-10-CM

## 2017-02-27 DIAGNOSIS — M25562 Pain in left knee: Secondary | ICD-10-CM

## 2017-02-27 NOTE — Therapy (Signed)
[facility] [address] [place], [place], [address] Phone: [phone]   Fax:  [phone]  Physical Therapy Treatment  Patient Details  Name: [name] MRN: [id number] Date of Birth: October 31, 1965 Referring Provider: [name]   Encounter Date: 02/27/2017      PT End of Session - 02/27/17 1305    Visit Number 4   Number of Visits 18   Date for PT Re-Evaluation 03/20/17   Authorization Type BCBS   Authorization - Visit Number 4   Authorization - Number of Visits 18   PT Start Time 1300   PT Stop Time 1345   PT Time Calculation (min) 45 min   Activity Tolerance Patient tolerated treatment well;No increased pain   Behavior During Therapy WFL for tasks assessed/performed      Past Medical History:  Diagnosis Date  . Arthritis   . Back pain 10/30/2015  . Bilateral shoulder pain 10/30/2015  . COPD (chronic obstructive pulmonary disease) (HCC)   . Fecal occult blood test positive 10/30/2015  . Hemorrhoids 10/30/2015  . Hypertension   . Hypothyroidism   . Large breasts 10/30/2015   44 DD  . Postmenopausal 10/30/2015  . Sleep apnea    pt DX but did not want to use CPAP.  Marland Kitchen Thyroid disease     Past Surgical History:  Procedure Laterality Date  . COLONOSCOPY N/A 11/12/2015   Procedure: COLONOSCOPY;  Surgeon: West Bali, MD;  [facility];  Service: Endoscopy;  Laterality: N/A;  8:30 AM  . KNEE ARTHROSCOPY WITH MEDIAL MENISECTOMY Left 05/04/2015   Procedure: KNEE ARTHROSCOPY WITH MEDIAL AND LATERAL MENISECTOMY;  Surgeon: Vickki Hearing, MD;  Location: [facility];  Service: Orthopedics;  Laterality: Left;  . NASAL SINUS SURGERY    . TOTAL KNEE ARTHROPLASTY Left 01/29/2017   Procedure: TOTAL KNEE ARTHROPLASTY;  Surgeon: Vickki Hearing, MD;  Location: [facility];  Service: Orthopedics;  Laterality: Left;    There were no vitals filed for this visit.      Subjective Assessment - 02/27/17 1301    Subjective Pt stated  she got out of shower by herself for the first time, current pain scale 3/10 Lt knee.   Pertinent History ; OA in right knee    Patient Stated Goals sleep better, walk better, stand longer    Currently in Pain? Yes   Pain Score 3    Pain Location Knee   Pain Orientation Left   Pain Descriptors / Indicators Aching   Pain Type Acute pain   Pain Onset 1 to 4 weeks ago   Pain Frequency Intermittent   Aggravating Factors  bending    Pain Relieving Factors ice   Effect of Pain on Daily Activities increases               [facility] Adult PT Treatment/Exercise - 02/27/17 0001      Knee/Hip Exercises: Stretches   Active Hamstring Stretch Left;3 reps;30 seconds   Active Hamstring Stretch Limitations supine with towel   Knee: Self-Stretch to increase Flexion Left;10 seconds   Knee: Self-Stretch Limitations Knee drives on 12in step 10x 10" holds   Gastroc Stretch 3 reps;30 seconds   Gastroc Stretch Limitations slant board     Knee/Hip Exercises: Standing   Heel Raises Both;15 reps   Heel Raises Limitations toeraises 15 reps   Knee Flexion Left;15 reps   Forward Lunges Both;10 reps   Forward Lunges Limitations onto 4" box  Terminal Knee Extension Limitations 10x GTB   Functional Squat 10 reps     Knee/Hip Exercises: Seated   Sit to Sand 10 reps;without UE support  cueing for eccentric control     Knee/Hip Exercises: Supine   Quad Sets Left;10 reps   Heel Slides Left;5 reps   Knee Extension Limitations 2   Knee Flexion Limitations 93     Knee/Hip Exercises: Prone   Hamstring Curl 10 reps   Straight Leg Raises Limitations TKE 10x 5"     Manual Therapy   Manual Therapy Edema management;Myofascial release   Manual therapy comments completed seperately from all other skilled interventions   Edema Management retro to decrease edema   Myofascial Release to tight fascia perimeter of knee                  PT Short Term Goals - 02/18/17 1326      PT SHORT TERM GOAL #1    Title Pt left knee flexion to be to 95 degrees to allow pt to sit for 45 minutes in comfort to eat at a restaurant.    Time 3   Period Weeks   Status New     PT SHORT TERM GOAL #2   Title Pt to be walking inside without a cane.   Time 3   Period Weeks   Status New     PT SHORT TERM GOAL #3   Title PT pain in her left knee to be no greater than a 5/10 to allow pt to be able to stand for 15 minutes at a time to be able to shower.    Time 3   Period Weeks     PT SHORT TERM GOAL #4   Title Pt to be able to walk for 20 minutes without increased left knee pain to be able to complete short shopping trips.    Time 3   Period Weeks   Status New     PT SHORT TERM GOAL #5   Title Pt to be waking only one time a night    Time 3   Period Weeks   Status New           PT Long Term Goals - 02/18/17 1344      PT LONG TERM GOAL #1   Title Pt  left knee pain level to be no greater than a 2/10 to allow pt to be able to walk for an hour without difficulty.    Time 6   Period Weeks   Status New     PT LONG TERM GOAL #2   Title Pt to be walking without any assistive device both in and outside.    Time 6   Period Weeks   Status New     PT LONG TERM GOAL #3   Title Pt left knee extension to be no greater than a 2 to allow normalized gait.    Time 6   Period Weeks   Status New     PT LONG TERM GOAL #4   Title Pt left knee flexion to be at least 115 to be able to squat to pick items off the floor.    Time 6   Period Weeks   Status New     PT LONG TERM GOAL #5   Title Pt left LE strength to be increased by a grade to allow pt to come sit to stand without difficulty and to go up 4 steps in  a reciprocal manner.    Time 6   Period Weeks   Status New     Additional Long Term Goals   Additional Long Term Goals Yes     PT LONG TERM GOAL #6   Title PT  to be sleeping throughout the night    Time 6   Period Weeks   Status New               Plan - 02/27/17 1348     Clinical Impression Statement Continued session foucs on ROM with some functional strengthening exercises.  Pt arrived with increased edema present proximal knee, began session with manual techniques to address edema control and myofascial release techniques to address adhesions on knee prior ROM based therex.  Pt able to demonstrate good form with therex with minimal cueing for form initially.  Pt able to complete hamstring curls this session with no reports of pain.  AROM 2-93 degrees today.     Rehab Potential Good   PT Frequency 3x / week   PT Duration 6 weeks   PT Treatment/Interventions ADLs/Self Care Home Management;Cryotherapy;Electrical Stimulation;Gait training;Stair training;Functional mobility training;Therapeutic activities;Therapeutic exercise;Balance training;Patient/family education;Manual techniques   PT Next Visit Plan Focus on ROM primarily and progress funtional strengthening as this improves to WNL. Continue manual as needed.  Measure progress with ROM.     PT Home Exercise Plan heel raises; squats, quad sets, heel slides, hamstring stretch, hip flexion,abduction and extension.       Patient will benefit from skilled therapeutic intervention in order to improve the following deficits and impairments:  Abnormal gait, Decreased activity tolerance, Decreased balance, Decreased strength, Difficulty walking, Pain, Increased edema, Decreased range of motion  Visit Diagnosis: Stiffness of left knee  Acute pain of left knee  Muscle weakness (generalized)  Other abnormalities of gait and mobility     Problem List Patient Active Problem List   Diagnosis Date Noted  . Primary osteoarthritis of left knee   . Special screening for malignant neoplasms, colon   . Hemorrhoids 10/30/2015  . Fecal occult blood test positive 10/30/2015  . Postmenopausal 10/30/2015  . Large breasts 10/30/2015  . Bilateral shoulder pain 10/30/2015  . Back pain 10/30/2015  . Medial meniscus tear    . Lateral meniscus tear, current   . Erosive lichen planus of vulva 08/28/2014  . 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 Right hip flexor tightness 12/06/2013  . Plantar fasciitis 12/06/2013   Becky Sax, LPTA; CBIS [phone]  Juel Burrow 02/27/2017, 5:07 PM  [facility] [address] [place], [place], [address] Phone: [phone]   Fax:  [phone]  Name: [name] MRN: [id number] Date of Birth: May 04, 1966

## 2017-03-02 ENCOUNTER — Other Ambulatory Visit: Payer: Self-pay | Admitting: *Deleted

## 2017-03-02 ENCOUNTER — Telehealth: Payer: Self-pay | Admitting: Orthopedic Surgery

## 2017-03-02 MED ORDER — HYDROCODONE-ACETAMINOPHEN 5-325 MG PO TABS: 1.0000 | ORAL_TABLET | Freq: Four times a day (QID) | ORAL | 0 refills | Status: DC | PRN

## 2017-03-02 MED ORDER — CLINDAMYCIN HCL 300 MG PO CAPS: ORAL_CAPSULE | ORAL | 3 refills | Status: DC

## 2017-03-02 NOTE — Telephone Encounter (Signed)
Patient asking about (1) pre-anti-biotics for dental cleaning appointment (states may need to re-schedule, which is fine, as it is for tomorrow, 03/03/17)                                   (2) refill on pain medication  HYDROcodone-acetaminophen (NORCO) 7.5-325 MG tablet 30 tablet

## 2017-03-02 NOTE — Telephone Encounter (Signed)
PLEASE ADVISE ANTIBIOTIC SENT TO HER PHARMACY

## 2017-03-03 ENCOUNTER — Ambulatory Visit (HOSPITAL_COMMUNITY): Payer: BLUE CROSS/BLUE SHIELD | Admitting: Physical Therapy

## 2017-03-03 DIAGNOSIS — M25562 Pain in left knee: Secondary | ICD-10-CM | POA: Diagnosis not present

## 2017-03-03 DIAGNOSIS — R2689 Other abnormalities of gait and mobility: Secondary | ICD-10-CM

## 2017-03-03 DIAGNOSIS — M25662 Stiffness of left knee, not elsewhere classified: Secondary | ICD-10-CM

## 2017-03-03 DIAGNOSIS — M6281 Muscle weakness (generalized): Secondary | ICD-10-CM | POA: Diagnosis not present

## 2017-03-03 NOTE — Therapy (Signed)
[facility] [address] [place], [place], [address] Phone: [phone]   Fax:  [phone]  Physical Therapy Treatment  Patient Details  Name: [name] MRN: [id number] Date of Birth: 09/14/1966 Referring Provider: [name]   Encounter Date: 03/03/2017      PT End of Session - 03/03/17 1513    Visit Number 5   Number of Visits 18   Date for PT Re-Evaluation 03/20/17   Authorization Type BCBS   Authorization - Visit Number 5   Authorization - Number of Visits 18   PT Start Time 0950   PT Stop Time 1032   PT Time Calculation (min) 42 min   Activity Tolerance Patient tolerated treatment well;No increased pain   Behavior During Therapy WFL for tasks assessed/performed      Past Medical History:  Diagnosis Date  . Arthritis   . Back pain 10/30/2015  . Bilateral shoulder pain 10/30/2015  . COPD (chronic obstructive pulmonary disease) (HCC)   . Fecal occult blood test positive 10/30/2015  . Hemorrhoids 10/30/2015  . Hypertension   . Hypothyroidism   . Large breasts 10/30/2015   44 DD  . Postmenopausal 10/30/2015  . Sleep apnea    pt DX but did not want to use CPAP.  Marland Kitchen Thyroid disease     Past Surgical History:  Procedure Laterality Date  . COLONOSCOPY N/A 11/12/2015   Procedure: COLONOSCOPY;  Surgeon: West Bali, MD;  [facility];  Service: Endoscopy;  Laterality: N/A;  8:30 AM  . KNEE ARTHROSCOPY WITH MEDIAL MENISECTOMY Left 05/04/2015   Procedure: KNEE ARTHROSCOPY WITH MEDIAL AND LATERAL MENISECTOMY;  Surgeon: Vickki Hearing, MD;  Location: [facility];  Service: Orthopedics;  Laterality: Left;  . NASAL SINUS SURGERY    . TOTAL KNEE ARTHROPLASTY Left 01/29/2017   Procedure: TOTAL KNEE ARTHROPLASTY;  Surgeon: Vickki Hearing, MD;  Location: [facility];  Service: Orthopedics;  Laterality: Left;    There were no vitals filed for this visit.      Subjective Assessment - 03/03/17 1510    Subjective Pt states  her knee is feeling better.   Currently with 2-3 pain.   Currently in Pain? Yes   Pain Score 2    Pain Location Knee   Pain Orientation Left   Pain Descriptors / Indicators Aching   Pain Type Acute pain                         [facility] Adult PT Treatment/Exercise - 03/03/17 0001      Knee/Hip Exercises: Stretches   Active Hamstring Stretch Left;3 reps;30 seconds   Active Hamstring Stretch Limitations supine with towel   Knee: Self-Stretch to increase Flexion Left;10 seconds   Knee: Self-Stretch Limitations Knee drives on 12in step 10x 10" holds   Gastroc Stretch 3 reps;30 seconds   Gastroc Stretch Limitations slant board     Knee/Hip Exercises: Standing   Heel Raises Both;15 reps   Heel Raises Limitations toeraises 15 reps   Knee Flexion Left;15 reps   Forward Lunges 15 reps;Both   Forward Lunges Limitations onto 4" box   Terminal Knee Extension Limitations 15x GTB   Functional Squat 15 reps     Knee/Hip Exercises: Supine   Quad Sets Left;10 reps   Heel Slides Left;10 reps   Knee Extension Limitations 2   Knee Flexion Limitations 93     Knee/Hip Exercises: Prone   Hamstring Curl  15 reps   Straight Leg Raises Limitations TKE 15x 5"     Manual Therapy   Manual Therapy Edema management;Myofascial release   Manual therapy comments completed seperately from all other skilled interventions   Edema Management retro to decrease edema   Myofascial Release to tight fascia perimeter of knee                  PT Short Term Goals - 02/18/17 1326      PT SHORT TERM GOAL #1   Title Pt left knee flexion to be to 95 degrees to allow pt to sit for 45 minutes in comfort to eat at a restaurant.    Time 3   Period Weeks   Status New     PT SHORT TERM GOAL #2   Title Pt to be walking inside without a cane.   Time 3   Period Weeks   Status New     PT SHORT TERM GOAL #3   Title PT pain in her left knee to be no greater than a 5/10 to allow pt to be able to  stand for 15 minutes at a time to be able to shower.    Time 3   Period Weeks     PT SHORT TERM GOAL #4   Title Pt to be able to walk for 20 minutes without increased left knee pain to be able to complete short shopping trips.    Time 3   Period Weeks   Status New     PT SHORT TERM GOAL #5   Title Pt to be waking only one time a night    Time 3   Period Weeks   Status New           PT Long Term Goals - 02/18/17 1344      PT LONG TERM GOAL #1   Title Pt  left knee pain level to be no greater than a 2/10 to allow pt to be able to walk for an hour without difficulty.    Time 6   Period Weeks   Status New     PT LONG TERM GOAL #2   Title Pt to be walking without any assistive device both in and outside.    Time 6   Period Weeks   Status New     PT LONG TERM GOAL #3   Title Pt left knee extension to be no greater than a 2 to allow normalized gait.    Time 6   Period Weeks   Status New     PT LONG TERM GOAL #4   Title Pt left knee flexion to be at least 115 to be able to squat to pick items off the floor.    Time 6   Period Weeks   Status New     PT LONG TERM GOAL #5   Title Pt left LE strength to be increased by a grade to allow pt to come sit to stand without difficulty and to go up 4 steps in a reciprocal manner.    Time 6   Period Weeks   Status New     Additional Long Term Goals   Additional Long Term Goals Yes     PT LONG TERM GOAL #6   Title PT  to be sleeping throughout the night    Time 6   Period Weeks   Status New  Plan - 03/03/17 1514    Clinical Impression Statement Continued with primary focus on ROM.  Able to increase reps to 15 with all therex this session.  completed myofascial to knee, mainly central and proximal scar region where bulk of adhesions remain.  Able to loosen these areas but unable to achieve greater ROM than 2-93.  Pt with improved gait at end of session.    Rehab Potential Good   PT Frequency 3x / week    PT Duration 6 weeks   PT Treatment/Interventions ADLs/Self Care Home Management;Cryotherapy;Electrical Stimulation;Gait training;Stair training;Functional mobility training;Therapeutic activities;Therapeutic exercise;Balance training;Patient/family education;Manual techniques   PT Next Visit Plan Focus on ROM primarily and progress funtional strengthening as this improves to WNL. Continue manual as needed.  Measure progress with ROM each visit.     PT Home Exercise Plan heel raises; squats, quad sets, heel slides, hamstring stretch, hip flexion,abduction and extension.       Patient will benefit from skilled therapeutic intervention in order to improve the following deficits and impairments:  Abnormal gait, Decreased activity tolerance, Decreased balance, Decreased strength, Difficulty walking, Pain, Increased edema, Decreased range of motion  Visit Diagnosis: Stiffness of left knee  Acute pain of left knee  Muscle weakness (generalized)  Other abnormalities of gait and mobility     Problem List Patient Active Problem List   Diagnosis Date Noted  . Primary osteoarthritis of left knee   . Special screening for malignant neoplasms, colon   . Hemorrhoids 10/30/2015  . Fecal occult blood test positive 10/30/2015  . Postmenopausal 10/30/2015  . Large breasts 10/30/2015  . Bilateral shoulder pain 10/30/2015  . Back pain 10/30/2015  . Medial meniscus tear   . Lateral meniscus tear, current   . Erosive lichen planus of vulva 08/28/2014  . 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 Right hip flexor tightness 12/06/2013  . Plantar fasciitis 12/06/2013    Lurena Nida, PTA/CLT [phone]  03/03/2017, 3:18 PM  [facility] [address] [place], [place], [address] Phone: [phone]   Fax:  [phone]  Name: JERRIYAH LOUIS MRN: [id number] Date of Birth: 06-16-1966

## 2017-03-04 NOTE — Telephone Encounter (Signed)
On 03/02/17, notified patient. Done.

## 2017-03-05 ENCOUNTER — Ambulatory Visit (HOSPITAL_COMMUNITY): Payer: BLUE CROSS/BLUE SHIELD

## 2017-03-05 DIAGNOSIS — M6281 Muscle weakness (generalized): Secondary | ICD-10-CM | POA: Diagnosis not present

## 2017-03-05 DIAGNOSIS — R2689 Other abnormalities of gait and mobility: Secondary | ICD-10-CM | POA: Diagnosis not present

## 2017-03-05 DIAGNOSIS — M25562 Pain in left knee: Secondary | ICD-10-CM

## 2017-03-05 DIAGNOSIS — M25662 Stiffness of left knee, not elsewhere classified: Secondary | ICD-10-CM | POA: Diagnosis not present

## 2017-03-05 NOTE — Therapy (Signed)
[facility] [address] [place], [place], [address] Phone: [phone]   Fax:  [phone]  Physical Therapy Treatment  Patient Details  Name: Connie DresserJoan E Remund MRN: [id number] Date of Birth: 05/29/1966 Referring Provider: Fuller CanadaStanley Harrison   Encounter Date: 03/05/2017      PT End of Session - 03/05/17 1007    Visit Number 6   Number of Visits 18   Date for PT Re-Evaluation 03/20/17   Authorization Type BCBS   Authorization - Visit Number 6   Authorization - Number of Visits 18   PT Start Time 0950   PT Stop Time 1032   PT Time Calculation (min) 42 min   Activity Tolerance Patient tolerated treatment well;No increased pain   Behavior During Therapy WFL for tasks assessed/performed      Past Medical History:  Diagnosis Date  . Arthritis   . Back pain 10/30/2015  . Bilateral shoulder pain 10/30/2015  . COPD (chronic obstructive pulmonary disease) (HCC)   . Fecal occult blood test positive 10/30/2015  . Hemorrhoids 10/30/2015  . Hypertension   . Hypothyroidism   . Large breasts 10/30/2015   44 DD  . Postmenopausal 10/30/2015  . Sleep apnea    pt DX but did not want to use CPAP.  Marland Kitchen. Thyroid disease     Past Surgical History:  Procedure Laterality Date  . COLONOSCOPY N/A 11/12/2015   Procedure: COLONOSCOPY;  Surgeon: West BaliSandi L Fields, MD;  [facility];  Service: Endoscopy;  Laterality: N/A;  8:30 AM  . KNEE ARTHROSCOPY WITH MEDIAL MENISECTOMY Left 05/04/2015   Procedure: KNEE ARTHROSCOPY WITH MEDIAL AND LATERAL MENISECTOMY;  Surgeon: Vickki HearingStanley E Harrison, MD;  Location: [facility];  Service: Orthopedics;  Laterality: Left;  . NASAL SINUS SURGERY    . TOTAL KNEE ARTHROPLASTY Left 01/29/2017   Procedure: TOTAL KNEE ARTHROPLASTY;  Surgeon: Vickki HearingHarrison, Stanley E, MD;  Location: [facility];  Service: Orthopedics;  Laterality: Left;    There were no vitals filed for this visit.      Subjective Assessment - 03/05/17 0948    Subjective Pt stated  her knee is feeling okay today, current pain scale 3-4/10.   Pertinent History ; OA in right knee    Patient Stated Goals sleep better, walk better, stand longer    Currently in Pain? Yes   Pain Score 4    Pain Location Knee   Pain Orientation Right;Left   Pain Descriptors / Indicators Tightness  Lt tight and achey; Rt knee sharp    Pain Type Acute pain   Pain Onset 1 to 4 weeks ago   Pain Frequency Intermittent   Aggravating Factors  bending    Pain Relieving Factors ice   Effect of Pain on Daily Activities increases                         [facility] Adult PT Treatment/Exercise - 03/05/17 0001      Knee/Hip Exercises: Stretches   Active Hamstring Stretch Left;3 reps;30 seconds   Active Hamstring Stretch Limitations supine with towel   Quad Stretch 3 reps;30 seconds   Quad Stretch Limitations prone with rope   Knee: Self-Stretch to increase Flexion Left;10 seconds   Knee: Self-Stretch Limitations Knee drives on 12in step 10x 10" holds     Knee/Hip Exercises: Standing   Heel Raises Both;15 reps   Heel Raises Limitations toeraises 15 reps   Knee Flexion Left;15 reps   Functional  Squat 15 reps     Knee/Hip Exercises: Supine   Quad Sets Left;10 reps   Heel Slides Left;10 reps   Knee Extension Limitations 2   Knee Flexion Limitations 95     Knee/Hip Exercises: Prone   Hamstring Curl 15 reps     Manual Therapy   Manual Therapy Edema management;Myofascial release   Manual therapy comments completed seperately from all other skilled interventions   Edema Management retro to decrease edema   Myofascial Release to tight fascia perimeter of knee                  PT Short Term Goals - 02/18/17 1326      PT SHORT TERM GOAL #1   Title Pt left knee flexion to be to 95 degrees to allow pt to sit for 45 minutes in comfort to eat at a restaurant.    Time 3   Period Weeks   Status New     PT SHORT TERM GOAL #2   Title Pt to be walking inside without a  cane.   Time 3   Period Weeks   Status New     PT SHORT TERM GOAL #3   Title PT pain in her left knee to be no greater than a 5/10 to allow pt to be able to stand for 15 minutes at a time to be able to shower.    Time 3   Period Weeks     PT SHORT TERM GOAL #4   Title Pt to be able to walk for 20 minutes without increased left knee pain to be able to complete short shopping trips.    Time 3   Period Weeks   Status New     PT SHORT TERM GOAL #5   Title Pt to be waking only one time a night    Time 3   Period Weeks   Status New           PT Long Term Goals - 02/18/17 1344      PT LONG TERM GOAL #1   Title Pt  left knee pain level to be no greater than a 2/10 to allow pt to be able to walk for an hour without difficulty.    Time 6   Period Weeks   Status New     PT LONG TERM GOAL #2   Title Pt to be walking without any assistive device both in and outside.    Time 6   Period Weeks   Status New     PT LONG TERM GOAL #3   Title Pt left knee extension to be no greater than a 2 to allow normalized gait.    Time 6   Period Weeks   Status New     PT LONG TERM GOAL #4   Title Pt left knee flexion to be at least 115 to be able to squat to pick items off the floor.    Time 6   Period Weeks   Status New     PT LONG TERM GOAL #5   Title Pt left LE strength to be increased by a grade to allow pt to come sit to stand without difficulty and to go up 4 steps in a reciprocal manner.    Time 6   Period Weeks   Status New     Additional Long Term Goals   Additional Long Term Goals Yes     PT LONG TERM GOAL #  6   Title PT  to be sleeping throughout the night    Time 6   Period Weeks   Status New               Plan - 03/05/17 1016    Clinical Impression Statement Primary session focus with AROM.  Began session with manual to address edema present proximal knee and myofascial technqiues to address adhesions proximal incision.  Added quad stretch to improve knee  mobility.  Improved AROM 2-96 degrees.  Continued with ROM and functional strengthening therex with good form/techniques complete with minimal cueing for form.  No reoprts of increased pain through session.   Rehab Potential Good   PT Frequency 3x / week   PT Duration 6 weeks   PT Treatment/Interventions ADLs/Self Care Home Management;Cryotherapy;Electrical Stimulation;Gait training;Stair training;Functional mobility training;Therapeutic activities;Therapeutic exercise;Balance training;Patient/family education;Manual techniques   PT Next Visit Plan Focus on ROM primarily and progress funtional strengthening as this improves to WNL. Continue manual as needed.  Measure progress with ROM each visit.     PT Home Exercise Plan heel raises; squats, quad sets, heel slides, hamstring stretch, hip flexion,abduction and extension.       Patient will benefit from skilled therapeutic intervention in order to improve the following deficits and impairments:  Abnormal gait, Decreased activity tolerance, Decreased balance, Decreased strength, Difficulty walking, Pain, Increased edema, Decreased range of motion  Visit Diagnosis: Stiffness of left knee  Acute pain of left knee  Muscle weakness (generalized)  Other abnormalities of gait and mobility     Problem List Patient Active Problem List   Diagnosis Date Noted  . Primary osteoarthritis of left knee   . Special screening for malignant neoplasms, colon   . Hemorrhoids 10/30/2015  . Fecal occult blood test positive 10/30/2015  . Postmenopausal 10/30/2015  . Large breasts 10/30/2015  . Bilateral shoulder pain 10/30/2015  . Back pain 10/30/2015  . Medial meniscus tear   . Lateral meniscus tear, current   . Erosive lichen planus of vulva 08/28/2014  . 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 Right hip flexor tightness 12/06/2013  . Plantar fasciitis 12/06/2013   Becky Sax, LPTA; CBIS [phone]  Juel Burrow 03/05/2017, 11:38  AM  [facility] [address] [place], [place], [address] Phone: [phone]   Fax:  [phone]  Name: AUDRENA TALAGA MRN: [id number] Date of Birth: [birth year]

## 2017-03-10 ENCOUNTER — Ambulatory Visit (HOSPITAL_COMMUNITY): Payer: BLUE CROSS/BLUE SHIELD | Admitting: Physical Therapy

## 2017-03-10 DIAGNOSIS — M6281 Muscle weakness (generalized): Secondary | ICD-10-CM

## 2017-03-10 DIAGNOSIS — M25662 Stiffness of left knee, not elsewhere classified: Secondary | ICD-10-CM | POA: Diagnosis not present

## 2017-03-10 DIAGNOSIS — R2689 Other abnormalities of gait and mobility: Secondary | ICD-10-CM | POA: Diagnosis not present

## 2017-03-10 DIAGNOSIS — M25562 Pain in left knee: Secondary | ICD-10-CM

## 2017-03-10 NOTE — Therapy (Signed)
[place] [facility] [address] [place], [place], [address] Phone: [phone]   Fax:  [phone]  Physical Therapy Treatment  Patient Details  Name: [name] MRN: [id number] Date of Birth: 12/25/1965 Referring Provider: Fuller CanadaStanley Harrison   Encounter Date: 03/10/2017      PT End of Session - 03/10/17 1027    Visit Number 7   Number of Visits 18   Date for PT Re-Evaluation 03/20/17   Authorization Type BCBS   Authorization - Visit Number 7   Authorization - Number of Visits 18   PT Start Time 0945   PT Stop Time 1030   PT Time Calculation (min) 45 min   Activity Tolerance Patient tolerated treatment well;No increased pain   Behavior During Therapy WFL for tasks assessed/performed      Past Medical History:  Diagnosis Date  . Arthritis   . Back pain 10/30/2015  . Bilateral shoulder pain 10/30/2015  . COPD (chronic obstructive pulmonary disease) (HCC)   . Fecal occult blood test positive 10/30/2015  . Hemorrhoids 10/30/2015  . Hypertension   . Hypothyroidism   . Large breasts 10/30/2015   44 DD  . Postmenopausal 10/30/2015  . Sleep apnea    pt DX but did not want to use CPAP.  Marland Kitchen. Thyroid disease     Past Surgical History:  Procedure Laterality Date  . COLONOSCOPY N/A 11/12/2015   Procedure: COLONOSCOPY;  Surgeon: West BaliSandi L Fields, MD;  [facility];  Service: Endoscopy;  Laterality: N/A;  8:30 AM  . KNEE ARTHROSCOPY WITH MEDIAL MENISECTOMY Left 05/04/2015   Procedure: KNEE ARTHROSCOPY WITH MEDIAL AND LATERAL MENISECTOMY;  Surgeon: Vickki HearingStanley E Harrison, MD;  Location: [facility];  Service: Orthopedics;  Laterality: Left;  . NASAL SINUS SURGERY    . TOTAL KNEE ARTHROPLASTY Left 01/29/2017   Procedure: TOTAL KNEE ARTHROPLASTY;  Surgeon: Vickki HearingHarrison, Stanley E, MD;  Location: [facility];  Service: Orthopedics;  Laterality: Left;    There were no vitals filed for this visit.      Subjective Assessment - 03/10/17 1558    Subjective PT states  she is doing OK today, doing her exercises and working on her ROM.  No pain   Currently in Pain? No/denies                         [facility] Adult PT Treatment/Exercise - 03/10/17 0001      Knee/Hip Exercises: Stretches   Knee: Self-Stretch to increase Flexion Left;10 seconds   Knee: Self-Stretch Limitations Knee drives on 12in step 10x 10" holds   Gastroc Stretch 3 reps;30 seconds   Gastroc Stretch Limitations slant board     Knee/Hip Exercises: Standing   Forward Lunges 15 reps;Both   Forward Lunges Limitations onto 4" box   Lateral Step Up Left;10 reps;Step Height: 4";Hand Hold: 1   Functional Squat 15 reps     Knee/Hip Exercises: Supine   Heel Slides Left;10 reps   Knee Extension Limitations 2   Knee Flexion Limitations 98  Rt normal ROM is 112 degrees     Manual Therapy   Manual Therapy Edema management;Myofascial release   Manual therapy comments completed seperately from all other skilled interventions   Edema Management retro to decrease edema   Myofascial Release to tight fascia perimeter of knee                  PT Short Term Goals - 02/18/17 1326  PT SHORT TERM GOAL #1   Title Pt left knee flexion to be to 95 degrees to allow pt to sit for 45 minutes in comfort to eat at a restaurant.    Time 3   Period Weeks   Status New     PT SHORT TERM GOAL #2   Title Pt to be walking inside without a cane.   Time 3   Period Weeks   Status New     PT SHORT TERM GOAL #3   Title PT pain in her left knee to be no greater than a 5/10 to allow pt to be able to stand for 15 minutes at a time to be able to shower.    Time 3   Period Weeks     PT SHORT TERM GOAL #4   Title Pt to be able to walk for 20 minutes without increased left knee pain to be able to complete short shopping trips.    Time 3   Period Weeks   Status New     PT SHORT TERM GOAL #5   Title Pt to be waking only one time a night    Time 3   Period Weeks   Status New            PT Long Term Goals - 02/18/17 1344      PT LONG TERM GOAL #1   Title Pt  left knee pain level to be no greater than a 2/10 to allow pt to be able to walk for an hour without difficulty.    Time 6   Period Weeks   Status New     PT LONG TERM GOAL #2   Title Pt to be walking without any assistive device both in and outside.    Time 6   Period Weeks   Status New     PT LONG TERM GOAL #3   Title Pt left knee extension to be no greater than a 2 to allow normalized gait.    Time 6   Period Weeks   Status New     PT LONG TERM GOAL #4   Title Pt left knee flexion to be at least 115 to be able to squat to pick items off the floor.    Time 6   Period Weeks   Status New     PT LONG TERM GOAL #5   Title Pt left LE strength to be increased by a grade to allow pt to come sit to stand without difficulty and to go up 4 steps in a reciprocal manner.    Time 6   Period Weeks   Status New     Additional Long Term Goals   Additional Long Term Goals Yes     PT LONG TERM GOAL #6   Title PT  to be sleeping throughout the night    Time 6   Period Weeks   Status New               Plan - 03/10/17 1600    Clinical Impression Statement Continued with primary focus on AROM.  Began session with manual again to loosen adhesions and soft tissue restrictions.  Able to achieve 98 degrees flexion.  Measured Rt LE with 0-112 degrees achieved.  PT with very tight knot  and adhesions proximal scar that was 80 % resolved with manual.  PT overall improving each session.     Rehab Potential Good   PT Frequency 3x /  week   PT Duration 6 weeks   PT Treatment/Interventions ADLs/Self Care Home Management;Cryotherapy;Electrical Stimulation;Gait training;Stair training;Functional mobility training;Therapeutic activities;Therapeutic exercise;Balance training;Patient/family education;Manual techniques   PT Next Visit Plan Focus on ROM primarily and progress funtional strengthening as this  improves to WNL. Continue manual as needed.  Measure progress with ROM each visit.     PT Home Exercise Plan heel raises; squats, quad sets, heel slides, hamstring stretch, hip flexion,abduction and extension.       Patient will benefit from skilled therapeutic intervention in order to improve the following deficits and impairments:  Abnormal gait, Decreased activity tolerance, Decreased balance, Decreased strength, Difficulty walking, Pain, Increased edema, Decreased range of motion  Visit Diagnosis: Stiffness of left knee  Acute pain of left knee  Muscle weakness (generalized)  Other abnormalities of gait and mobility     Problem List Patient Active Problem List   Diagnosis Date Noted  . Primary osteoarthritis of left knee   . Special screening for malignant neoplasms, colon   . Hemorrhoids 10/30/2015  . Fecal occult blood test positive 10/30/2015  . Postmenopausal 10/30/2015  . Large breasts 10/30/2015  . Bilateral shoulder pain 10/30/2015  . Back pain 10/30/2015  . Medial meniscus tear   . Lateral meniscus tear, current   . Erosive lichen planus of vulva 08/28/2014  . 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 Right hip flexor tightness 12/06/2013  . Plantar fasciitis 12/06/2013    Lurena Nida, PTA/CLT [phone]  03/10/2017, 4:02 PM  [facility] [address] [place], [place], [address] Phone: [phone]   Fax:  [phone]  Name: [name] MRN: [id number] Date of Birth: [birth year]

## 2017-03-12 ENCOUNTER — Ambulatory Visit (HOSPITAL_COMMUNITY): Payer: BLUE CROSS/BLUE SHIELD

## 2017-03-12 DIAGNOSIS — M25562 Pain in left knee: Secondary | ICD-10-CM | POA: Diagnosis not present

## 2017-03-12 DIAGNOSIS — M6281 Muscle weakness (generalized): Secondary | ICD-10-CM

## 2017-03-12 DIAGNOSIS — R2689 Other abnormalities of gait and mobility: Secondary | ICD-10-CM | POA: Diagnosis not present

## 2017-03-12 DIAGNOSIS — M25662 Stiffness of left knee, not elsewhere classified: Secondary | ICD-10-CM | POA: Diagnosis not present

## 2017-03-12 NOTE — Therapy (Signed)
[place], [place], [address] Phone: [phone]   Fax:  [phone]  Physical Therapy Treatment  Patient Details  Name: [name] MRN: [id number] Date of Birth: August 02, 1966 Referring Provider: Arther Abbott   Encounter Date: 03/12/2017      PT End of Session - 03/12/17 0908    Visit Number 8   Number of Visits 18   Date for PT Re-Evaluation 03/20/17   Authorization Type BCBS   Authorization - Visit Number 8   Authorization - Number of Visits 18   PT Start Time 0903   PT Stop Time 0949   PT Time Calculation (min) 46 min   Activity Tolerance Patient tolerated treatment well;No increased pain   Behavior During Therapy WFL for tasks assessed/performed      Past Medical History:  Diagnosis Date  . Arthritis   . Back pain 10/30/2015  . Bilateral shoulder pain 10/30/2015  . COPD (chronic obstructive pulmonary disease) ([place])   . Fecal occult blood test positive 10/30/2015  . Hemorrhoids 10/30/2015  . Hypertension   . Hypothyroidism   . Large breasts 10/30/2015   44 DD  . Postmenopausal 10/30/2015  . Sleep apnea    pt DX but did not want to use CPAP.  Marland Kitchen Thyroid disease     Past Surgical History:  Procedure Laterality Date  . COLONOSCOPY N/A 11/12/2015   Procedure: COLONOSCOPY;  Surgeon: Danie Binder, MD;  [facility];  Service: Endoscopy;  Laterality: N/A;  8:30 AM  . KNEE ARTHROSCOPY WITH MEDIAL MENISECTOMY Left 05/04/2015   Procedure: KNEE ARTHROSCOPY WITH MEDIAL AND LATERAL MENISECTOMY;  Surgeon: Carole Civil, MD;  Location: [facility];  Service: Orthopedics;  Laterality: Left;  . NASAL SINUS SURGERY    . TOTAL KNEE ARTHROPLASTY Left 01/29/2017   Procedure: TOTAL KNEE ARTHROPLASTY;  Surgeon: Carole Civil, MD;  Location: [facility];  Service: Orthopedics;  Laterality: Left;    There were no vitals filed for this visit.      Subjective Assessment - 03/12/17 0905    Subjective Pt stated  knee is feeling good today, mainly tightness.  reports she rode bike at work yesterday, unable to make full revolution.   Patient Stated Goals sleep better, walk better, stand longer    Currently in Pain? Yes   Pain Score 3    Pain Location Knee   Pain Orientation Left   Pain Descriptors / Indicators Tightness   Pain Type Acute pain   Pain Onset 1 to 4 weeks ago   Pain Frequency Intermittent   Aggravating Factors  bending   Pain Relieving Factors ice   Effect of Pain on Daily Activities increases                         [facility] Adult PT Treatment/Exercise - 03/12/17 0001      Knee/Hip Exercises: Stretches   Knee: Self-Stretch to increase Flexion Left;10 seconds   Knee: Self-Stretch Limitations Knee drives on 92JJ step 94R 10" holds   Gastroc Stretch 3 reps;30 seconds   Gastroc Stretch Limitations slant board     Knee/Hip Exercises: Standing   Forward Lunges 15 reps;Both   Forward Lunges Limitations onto 4" box   Lateral Step Up Left;15 reps;Hand Hold: 0;Step Height: 4"   Functional Squat 15 reps   Stairs reciprocal 4in, meet to 7in      Knee/Hip Exercises: Seated   Heel Slides  10 reps     Knee/Hip Exercises: Supine   Heel Slides Left;10 reps   Knee Extension Limitations 1   Knee Flexion Limitations 106     Manual Therapy   Manual Therapy Edema management;Myofascial release   Manual therapy comments completed seperately from all other skilled interventions   Edema Management retro to decrease edema   Myofascial Release to tight fascia perimeter of knee                  PT Short Term Goals - 03/12/17 0916      PT SHORT TERM GOAL #1   Title Pt left knee flexion to be to 95 degrees to allow pt to sit for 45 minutes in comfort to eat at a restaurant.    Baseline 06/21:  AROM Lt knee 106 degrees flexion; able to sit 1 hour comfortable   Status Achieved     PT SHORT TERM GOAL #2   Title Pt to be walking inside without a cane.   Baseline 6/21:  Ambulated without AD indoors, does walk with SPC with some outdoor for safety   Status Achieved     PT SHORT TERM GOAL #3   Title PT pain in her left knee to be no greater than a 5/10 to allow pt to be able to stand for 15 minutes at a time to be able to shower.    Baseline 6/21:  Able to stand 15-20 minutes, able to shower, average pain scale 5/10   Status Partially Met     PT SHORT TERM GOAL #4   Title Pt to be able to walk for 20 minutes without increased left knee pain to be able to complete short shopping trips.    Baseline 6/21:  able to walk for 15-20 minutes with reports of knee tightness   Status Partially Met     PT SHORT TERM GOAL #5   Title Pt to be waking only one time a night    Baseline 06/21: Average waking 2-3 times a night to use restroom, wakes twice a night due to pain   Status On-going           PT Long Term Goals - 03/12/17 0921      PT LONG TERM GOAL #1   Title Pt  left knee pain level to be no greater than a 2/10 to allow pt to be able to walk for an hour without difficulty.    Status On-going     PT LONG TERM GOAL #2   Title Pt to be walking without any assistive device both in and outside.    Baseline 6/21: Ambulated without AD indoors, does walk with SPC with some outdoor for safety   Status Partially Met     PT LONG TERM GOAL #3   Title Pt left knee extension to be no greater than a 2 to allow normalized gait.    Baseline 06/21 1-106 degrees   Status Achieved     PT LONG TERM GOAL #4   Title Pt left knee flexion to be at least 115 to be able to squat to pick items off the floor.    Status On-going     PT LONG TERM GOAL #5   Title Pt left LE strength to be increased by a grade to allow pt to come sit to stand without difficulty and to go up 4 steps in a reciprocal manner.    Baseline 06/21:    Status On-going  PT LONG TERM GOAL #6   Title PT  to be sleeping throughout the night    Status On-going               Plan - 03/12/17  1135    Clinical Impression Statement Continued wiht primary focus on AROM with functional strengthening complete as well.  Pt reports return to MD on Friday so reviewed goals prior apt.  Pt progressing well with improved tolerance for sitting, standing and walking.  Pt now ambulates with no AD indoors and has began to reduce frequency outdoors.  Pt does continue to present with edema present proximal knee and fascial restriciton over incison limited range.  Improved AROM at EOS following manual techniques to address limiations with AROM 1-106 degrees.  Pt will continue to benefit from skilled intervention to improve knee mobilty and functional strengthening.     Rehab Potential Good   PT Frequency 3x / week   PT Duration 6 weeks   PT Treatment/Interventions ADLs/Self Care Home Management;Cryotherapy;Electrical Stimulation;Gait training;Stair training;Functional mobility training;Therapeutic activities;Therapeutic exercise;Balance training;Patient/family education;Manual techniques   PT Next Visit Plan Focus on ROM primarily and progress funtional strengthening as this improves to WNL. Continue manual as needed.  Measure progress with ROM each visit.        Patient will benefit from skilled therapeutic intervention in order to improve the following deficits and impairments:  Abnormal gait, Decreased activity tolerance, Decreased balance, Decreased strength, Difficulty walking, Pain, Increased edema, Decreased range of motion  Visit Diagnosis: Stiffness of left knee  Acute pain of left knee  Muscle weakness (generalized)  Other abnormalities of gait and mobility     Problem List Patient Active Problem List   Diagnosis Date Noted  . Primary osteoarthritis of left knee   . Special screening for malignant neoplasms, colon   . Hemorrhoids 10/30/2015  . Fecal occult blood test positive 10/30/2015  . Postmenopausal 10/30/2015  . Large breasts 10/30/2015  . Bilateral shoulder pain 10/30/2015   . Back pain 10/30/2015  . Medial meniscus tear   . Lateral meniscus tear, current   . Erosive lichen planus of vulva 08/28/2014  . 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 Right hip flexor tightness 12/06/2013  . Plantar fasciitis 12/06/2013   Ihor Austin, [place]; [place]  Aldona Lento 03/12/2017, 11:44 AM  [place] [address] [place], [place], [address] Phone: [phone]   Fax:  [phone]  Name: [name] MRN: [id number] Date of Birth: May 30, 1966

## 2017-03-13 ENCOUNTER — Ambulatory Visit (HOSPITAL_COMMUNITY): Payer: BLUE CROSS/BLUE SHIELD

## 2017-03-13 ENCOUNTER — Ambulatory Visit (INDEPENDENT_AMBULATORY_CARE_PROVIDER_SITE_OTHER): Payer: BLUE CROSS/BLUE SHIELD | Admitting: Orthopedic Surgery

## 2017-03-13 ENCOUNTER — Encounter: Payer: Self-pay | Admitting: Orthopedic Surgery

## 2017-03-13 DIAGNOSIS — M6281 Muscle weakness (generalized): Secondary | ICD-10-CM

## 2017-03-13 DIAGNOSIS — R2689 Other abnormalities of gait and mobility: Secondary | ICD-10-CM | POA: Diagnosis not present

## 2017-03-13 DIAGNOSIS — Z4889 Encounter for other specified surgical aftercare: Secondary | ICD-10-CM

## 2017-03-13 DIAGNOSIS — M25662 Stiffness of left knee, not elsewhere classified: Secondary | ICD-10-CM

## 2017-03-13 DIAGNOSIS — M25461 Effusion, right knee: Secondary | ICD-10-CM | POA: Diagnosis not present

## 2017-03-13 DIAGNOSIS — M25562 Pain in left knee: Secondary | ICD-10-CM

## 2017-03-13 DIAGNOSIS — M25561 Pain in right knee: Secondary | ICD-10-CM

## 2017-03-13 NOTE — Progress Notes (Signed)
Postop visit status post left total knee. Patient says she's doing well however her right knee is bothering her more than the left and she would like aspiration in and an injection  Examination of the right knee shows that there does appear to be an effusion it's tight swollen it's hard to bend  Encounter Diagnoses  Name Primary?  Marland Kitchen. Aftercare following surgery Yes  . Acute pain of right knee     Procedure note injection and aspiration right knee joint  Verbal consent was obtained to aspirate and inject the right knee joint   Timeout was completed to confirm the site of aspiration and injection  An 18-gauge needle was used to aspirate the knee joint from a suprapatellar lateral approach.  The medications used were 40 mg of Depo-Medrol and 1% lidocaine 3 cc  Anesthesia was provided by ethyl chloride and the skin was prepped with alcohol.  After cleaning the skin with alcohol an 18-gauge needle was used to aspirate the right knee joint.  We obtained 30  cc of fluid  We follow this by injection of 40 mg of Depo-Medrol and 3 cc 1% lidocaine.  There were no complications. A sterile bandage was applied.  5 weeks follow up for left tka

## 2017-03-13 NOTE — Therapy (Signed)
[name] [address] [name], [place], [address] Phone: [phone]   Fax:  [phone]  Physical Therapy Treatment  Patient Details  Name: [name] MRN: [id number] Date of Birth: 1966/02/18 Referring Provider: [name]   Encounter Date: 03/13/2017      PT End of Session - 03/13/17 1356    Visit Number 9   Number of Visits 18   Date for PT Re-Evaluation 03/20/17   Authorization Type BC[employer]   Authorization - Visit Number 9   Authorization - Number of Visits 18   PT Start Time 1348   PT Stop Time 1426   PT Time Calculation (min) 38 min   Activity Tolerance Patient tolerated treatment well;No increased pain   Behavior During Therapy WFL for tasks assessed/performed      Past Medical History:  Diagnosis Date  . Arthritis   . Back pain 10/30/2015  . Bilateral shoulder pain 10/30/2015  . COPD (chronic obstructive pulmonary disease) ([facility])   . Fecal occult blood test positive 10/30/2015  . Hemorrhoids 10/30/2015  . Hypertension   . Hypothyroidism   . Large breasts 10/30/2015   44 DD  . Postmenopausal 10/30/2015  . Sleep apnea    pt DX but did not want to use CPAP.  Marland Kitchen Thyroid disease     Past Surgical History:  Procedure Laterality Date  . COLONOSCOPY N/A 11/12/2015   Procedure: COLONOSCOPY;  Surgeon: West Bali, MD;  [facility];  Service: Endoscopy;  Laterality: N/A;  8:30 AM  . KNEE ARTHROSCOPY WITH MEDIAL MENISECTOMY Left 05/04/2015   Procedure: KNEE ARTHROSCOPY WITH MEDIAL AND LATERAL MENISECTOMY;  Surgeon: Vickki Hearing, MD;  Location: [facility];  Service: Orthopedics;  Laterality: Left;  . NASAL SINUS SURGERY    . TOTAL KNEE ARTHROPLASTY Left 01/29/2017   Procedure: TOTAL KNEE ARTHROPLASTY;  Surgeon: Vickki Hearing, MD;  Location: [facility];  Service: Orthopedics;  Laterality: Left;    There were no vitals filed for this visit.      Subjective Assessment - 03/13/17 1353    Subjective Went to MD  reports Romeo Apple happy wiht progress, addressed Rt knee pain with 30cc fluid removed and shot for pain.  No reports of pain at entrance   Pertinent History ; OA in right knee    Patient Stated Goals sleep better, walk better, stand longer    Currently in Pain? No/denies                         [facility] Adult PT Treatment/Exercise - 03/13/17 0001      Knee/Hip Exercises: Stretches   Lobbyist 3 reps;30 seconds   Quad Stretch Limitations prone with rope   Knee: Self-Stretch to increase Flexion Left;10 seconds   Knee: Self-Stretch Limitations Knee drives on 12in step 10x 10" holds   Gastroc Stretch 3 reps;30 seconds   Gastroc Stretch Limitations slant board     Knee/Hip Exercises: Standing   Lateral Step Up Left;15 reps;Hand Hold: 2;Hand Hold: 1;Step Height: 6"   Forward Step Up Left;15 reps;Hand Hold: 0;Step Height: 6"   Functional Squat 15 reps     Knee/Hip Exercises: Supine   Heel Slides Left;10 reps   Knee Extension Limitations 1   Knee Flexion Limitations 106     Manual Therapy   Manual Therapy Edema management;Myofascial release   Manual therapy comments completed seperately from all other skilled interventions   Edema Management retro  to decrease edema   Myofascial Release to tight fascia perimeter of knee                  PT Short Term Goals - 03/12/17 0916      PT SHORT TERM GOAL #1   Title Pt left knee flexion to be to 95 degrees to allow pt to sit for 45 minutes in comfort to eat at a restaurant.    Baseline 06/21:  AROM Lt knee 106 degrees flexion; able to sit 1 hour comfortable   Status Achieved     PT SHORT TERM GOAL #2   Title Pt to be walking inside without a cane.   Baseline 6/21: Ambulated without AD indoors, does walk with SPC with some outdoor for safety   Status Achieved     PT SHORT TERM GOAL #3   Title PT pain in her left knee to be no greater than a 5/10 to allow pt to be able to stand for 15 minutes at a time to be able  to shower.    Baseline 6/21:  Able to stand 15-20 minutes, able to shower, average pain scale 5/10   Status Partially Met     PT SHORT TERM GOAL #4   Title Pt to be able to walk for 20 minutes without increased left knee pain to be able to complete short shopping trips.    Baseline 6/21:  able to walk for 15-20 minutes with reports of knee tightness   Status Partially Met     PT SHORT TERM GOAL #5   Title Pt to be waking only one time a night    Baseline 06/21: Average waking 2-3 times a night to use restroom, wakes twice a night due to pain   Status On-going           PT Long Term Goals - 03/12/17 0921      PT LONG TERM GOAL #1   Title Pt  left knee pain level to be no greater than a 2/10 to allow pt to be able to walk for an hour without difficulty.    Status On-going     PT LONG TERM GOAL #2   Title Pt to be walking without any assistive device both in and outside.    Baseline 6/21: Ambulated without AD indoors, does walk with SPC with some outdoor for safety   Status Partially Met     PT LONG TERM GOAL #3   Title Pt left knee extension to be no greater than a 2 to allow normalized gait.    Baseline 06/21 1-106 degrees   Status Achieved     PT LONG TERM GOAL #4   Title Pt left knee flexion to be at least 115 to be able to squat to pick items off the floor.    Status On-going     PT LONG TERM GOAL #5   Title Pt left LE strength to be increased by a grade to allow pt to come sit to stand without difficulty and to go up 4 steps in a reciprocal manner.    Baseline 06/21:    Status On-going     PT LONG TERM GOAL #6   Title PT  to be sleeping throughout the night    Status On-going               Plan - 03/13/17 1423    Clinical Impression Statement Continue primary focus with AROM with additional functional strenghtening  this session.  Pt continues to present with edema present proximal knee and faascial restrictions over incision and perimeter.  Manual  techqnieus complete to address restricitions.  Pt tolerated well towards tx with no reports of increased pain through session.  Increased step up and lateral step up height for functional strengthening with min cueing for form and control with increased demand.     Rehab Potential Good   PT Frequency 3x / week   PT Duration 6 weeks   PT Treatment/Interventions ADLs/Self Care Home Management;Cryotherapy;Electrical Stimulation;Gait training;Stair training;Functional mobility training;Therapeutic activities;Therapeutic exercise;Balance training;Patient/family education;Manual techniques   PT Next Visit Plan Focus on ROM primarily and progress funtional strengthening as this improves to WNL. Continue manual as needed.  Measure progress with ROM each visit.     PT Home Exercise Plan heel raises; squats, quad sets, heel slides, hamstring stretch, hip flexion,abduction and extension.       Patient will benefit from skilled therapeutic intervention in order to improve the following deficits and impairments:  Abnormal gait, Decreased activity tolerance, Decreased balance, Decreased strength, Difficulty walking, Pain, Increased edema, Decreased range of motion  Visit Diagnosis: Stiffness of left knee  Acute pain of left knee  Muscle weakness (generalized)  Other abnormalities of gait and mobility     Problem List Patient Active Problem List   Diagnosis Date Noted  . Primary osteoarthritis of left knee   . Special screening for malignant neoplasms, colon   . Hemorrhoids 10/30/2015  . Fecal occult blood test positive 10/30/2015  . Postmenopausal 10/30/2015  . Large breasts 10/30/2015  . Bilateral shoulder pain 10/30/2015  . Back pain 10/30/2015  . Medial meniscus tear   . Lateral meniscus tear, current   . Erosive lichen planus of vulva 08/28/2014  . 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 Right hip flexor tightness 12/06/2013  . Plantar fasciitis 12/06/2013   Ihor Austin, [place];  [place]  Aldona Lento 03/13/2017, 2:27 PM  [facility] [address] [place], [place], [address] Phone: [phone]   Fax:  [phone]  Name: KORDELIA SEVERIN MRN: [id number] Date of Birth: [birth year]

## 2017-03-17 ENCOUNTER — Ambulatory Visit (HOSPITAL_COMMUNITY): Payer: BLUE CROSS/BLUE SHIELD | Admitting: Physical Therapy

## 2017-03-17 DIAGNOSIS — M25562 Pain in left knee: Secondary | ICD-10-CM

## 2017-03-17 DIAGNOSIS — M25662 Stiffness of left knee, not elsewhere classified: Secondary | ICD-10-CM | POA: Diagnosis not present

## 2017-03-17 DIAGNOSIS — R2689 Other abnormalities of gait and mobility: Secondary | ICD-10-CM | POA: Diagnosis not present

## 2017-03-17 DIAGNOSIS — M6281 Muscle weakness (generalized): Secondary | ICD-10-CM

## 2017-03-17 NOTE — Therapy (Signed)
[place], [place], [address] Phone: [phone]   Fax:  [phone]  Physical Therapy Treatment  Patient Details  Name: [name] MRN: [id number] Date of Birth: 12/03/1965 Referring Provider: Arther Abbott   Encounter Date: 03/17/2017      PT End of Session - 03/17/17 1325    Visit Number 10   Number of Visits 18   Date for PT Re-Evaluation 03/20/17   Authorization Type BCBS   Authorization - Visit Number 10   Authorization - Number of Visits 18   PT Start Time 0903  started on bike by Ihor Austin, PTA   PT Stop Time [phone]   PT Time Calculation (min) 45 min   Activity Tolerance Patient tolerated treatment well;No increased pain   Behavior During Therapy WFL for tasks assessed/performed      Past Medical History:  Diagnosis Date  . Arthritis   . Back pain 10/30/2015  . Bilateral shoulder pain 10/30/2015  . COPD (chronic obstructive pulmonary disease) ([place])   . Fecal occult blood test positive 10/30/2015  . Hemorrhoids 10/30/2015  . Hypertension   . Hypothyroidism   . Large breasts 10/30/2015   44 DD  . Postmenopausal 10/30/2015  . Sleep apnea    pt DX but did not want to use CPAP.  Marland Kitchen Thyroid disease     Past Surgical History:  Procedure Laterality Date  . COLONOSCOPY N/A 11/12/2015   Procedure: COLONOSCOPY;  Surgeon: Danie Binder, MD;  [facility];  Service: Endoscopy;  Laterality: N/A;  8:30 AM  . KNEE ARTHROSCOPY WITH MEDIAL MENISECTOMY Left 05/04/2015   Procedure: KNEE ARTHROSCOPY WITH MEDIAL AND LATERAL MENISECTOMY;  Surgeon: Carole Civil, MD;  Location: [facility];  Service: Orthopedics;  Laterality: Left;  . NASAL SINUS SURGERY    . TOTAL KNEE ARTHROPLASTY Left 01/29/2017   Procedure: TOTAL KNEE ARTHROPLASTY;  Surgeon: Carole Civil, MD;  Location: [facility];  Service: Orthopedics;  Laterality: Left;    There were no vitals filed for this visit.      Subjective  Assessment - 03/17/17 0913    Subjective Pt states she is doing well today with pain of 6/10.    Currently in Pain? Yes   Pain Score 3    Pain Location Knee   Pain Orientation Left   Pain Descriptors / Indicators Tightness   Pain Type Acute pain                         [facility] Adult PT Treatment/Exercise - 03/17/17 0001      Knee/Hip Exercises: Stretches   Knee: Self-Stretch to increase Flexion Left;10 seconds   Knee: Self-Stretch Limitations Knee drives on 81JS step 31R 10" holds   Gastroc Stretch 3 reps;30 seconds   Gastroc Stretch Limitations slant board     Knee/Hip Exercises: Aerobic   Recumbent Bike seate 13 full revolutions 5 minutes     Knee/Hip Exercises: Standing   Forward Lunges 15 reps;Both   Forward Lunges Limitations onto 4" box   Lateral Step Up Left;10 reps;Hand Hold: 1   Lateral Step Up Limitations 7" bottom step   Forward Step Up Left;10 reps;Hand Hold: 1   Forward Step Up Limitations 7" bottom step   Stairs reciprocal 7 in with 1 HR 2RT     Knee/Hip Exercises: Supine   Heel Slides Left;10 reps   Straight Leg Raises Left;10 reps   Knee  Extension Limitations 1   Knee Flexion Limitations 103     Manual Therapy   Manual Therapy Edema management;Myofascial release   Manual therapy comments completed seperately from all other skilled interventions   Edema Management retro to decrease edema   Myofascial Release to tight fascia perimeter of knee                PT Education - 03/17/17 1408    Education provided Yes   Education Details continued HEP and work on deep self massage to decrease scar tissue.   Person(s) Educated Patient   Methods Explanation   Comprehension Verbalized understanding          PT Short Term Goals - 03/12/17 0916      PT SHORT TERM GOAL #1   Title Pt left knee flexion to be to 95 degrees to allow pt to sit for 45 minutes in comfort to eat at a restaurant.    Baseline 06/21:  AROM Lt knee 106 degrees  flexion; able to sit 1 hour comfortable   Status Achieved     PT SHORT TERM GOAL #2   Title Pt to be walking inside without a cane.   Baseline 6/21: Ambulated without AD indoors, does walk with SPC with some outdoor for safety   Status Achieved     PT SHORT TERM GOAL #3   Title PT pain in her left knee to be no greater than a 5/10 to allow pt to be able to stand for 15 minutes at a time to be able to shower.    Baseline 6/21:  Able to stand 15-20 minutes, able to shower, average pain scale 5/10   Status Partially Met     PT SHORT TERM GOAL #4   Title Pt to be able to walk for 20 minutes without increased left knee pain to be able to complete short shopping trips.    Baseline 6/21:  able to walk for 15-20 minutes with reports of knee tightness   Status Partially Met     PT SHORT TERM GOAL #5   Title Pt to be waking only one time a night    Baseline 06/21: Average waking 2-3 times a night to use restroom, wakes twice a night due to pain   Status On-going           PT Long Term Goals - 03/12/17 0921      PT LONG TERM GOAL #1   Title Pt  left knee pain level to be no greater than a 2/10 to allow pt to be able to walk for an hour without difficulty.    Status On-going     PT LONG TERM GOAL #2   Title Pt to be walking without any assistive device both in and outside.    Baseline 6/21: Ambulated without AD indoors, does walk with SPC with some outdoor for safety   Status Partially Met     PT LONG TERM GOAL #3   Title Pt left knee extension to be no greater than a 2 to allow normalized gait.    Baseline 06/21 1-106 degrees   Status Achieved     PT LONG TERM GOAL #4   Title Pt left knee flexion to be at least 115 to be able to squat to pick items off the floor.    Status On-going     PT LONG TERM GOAL #5   Title Pt left LE strength to be increased by a grade to  allow pt to come sit to stand without difficulty and to go up 4 steps in a reciprocal manner.    Baseline 06/21:     Status On-going     PT LONG TERM GOAL #6   Title PT  to be sleeping throughout the night    Status On-going               Plan - 03/17/17 1335    Clinical Impression Statement Continued with focus on AROM and functional stregthening.  Less edema present ths session, however continued adhesions/scar tissue proximal scar at distal quadricep area.  Spent large amount of time on manyual to decrease this tightness and decrease adhesions.   Unable to get greater than 103 degrees of flexion    Rehab Potential Good   PT Frequency 3x / week   PT Duration 6 weeks   PT Treatment/Interventions ADLs/Self Care Home Management;Cryotherapy;Electrical Stimulation;Gait training;Stair training;Functional mobility training;Therapeutic activities;Therapeutic exercise;Balance training;Patient/family education;Manual techniques   PT Next Visit Plan Focus on ROM primarily and progress funtional strengthening as this improves to WNL. Continue manual as needed.  Measure progress with ROM each visit.  continue to improve upon reciprocal stair training.  Work on single leg balance exericses.    PT Home Exercise Plan heel raises; squats, quad sets, heel slides, hamstring stretch, hip flexion,abduction and extension.       Patient will benefit from skilled therapeutic intervention in order to improve the following deficits and impairments:  Abnormal gait, Decreased activity tolerance, Decreased balance, Decreased strength, Difficulty walking, Pain, Increased edema, Decreased range of motion  Visit Diagnosis: Stiffness of left knee  Acute pain of left knee  Muscle weakness (generalized)  Other abnormalities of gait and mobility     Problem List Patient Active Problem List   Diagnosis Date Noted  . Primary osteoarthritis of left knee   . Special screening for malignant neoplasms, colon   . Hemorrhoids 10/30/2015  . Fecal occult blood test positive 10/30/2015  . Postmenopausal 10/30/2015  . Large  breasts 10/30/2015  . Bilateral shoulder pain 10/30/2015  . Back pain 10/30/2015  . Medial meniscus tear   . Lateral meniscus tear, current   . Erosive lichen planus of vulva 08/28/2014  . 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 Right hip flexor tightness 12/06/2013  . Plantar fasciitis 12/06/2013    [name] 03/17/2017, 2:08 PM  [place], [place], [address] Phone: [phone]   Fax:  [phone]  Name: [name] MRN: [id number] Date of Birth: 05-23-1966

## 2017-03-19 ENCOUNTER — Ambulatory Visit (HOSPITAL_COMMUNITY): Payer: BLUE CROSS/BLUE SHIELD

## 2017-03-19 DIAGNOSIS — M25562 Pain in left knee: Secondary | ICD-10-CM

## 2017-03-19 DIAGNOSIS — M25662 Stiffness of left knee, not elsewhere classified: Secondary | ICD-10-CM | POA: Diagnosis not present

## 2017-03-19 DIAGNOSIS — M6281 Muscle weakness (generalized): Secondary | ICD-10-CM | POA: Diagnosis not present

## 2017-03-19 DIAGNOSIS — R2689 Other abnormalities of gait and mobility: Secondary | ICD-10-CM | POA: Diagnosis not present

## 2017-03-19 NOTE — Therapy (Signed)
[place], [place], [address] Phone: [phone]   Fax:  [phone]  Physical Therapy Treatment  Patient Details  Name: [name] MRN: [id number] Date of Birth: March 02, 1966 Referring Provider: Arther Abbott   Encounter Date: 03/19/2017      PT End of Session - 03/19/17 1306    Visit Number 11   Number of Visits 18   Date for PT Re-Evaluation 03/20/17   Authorization Type BC[employer]   Authorization - Visit Number 11   Authorization - Number of Visits 18   PT Start Time 1301   PT Stop Time 8185  last 5 min on bike, no charge   PT Time Calculation (min) 46 min   Activity Tolerance Patient tolerated treatment well;No increased pain   Behavior During Therapy WFL for tasks assessed/performed      Past Medical History:  Diagnosis Date  . Arthritis   . Back pain 10/30/2015  . Bilateral shoulder pain 10/30/2015  . COPD (chronic obstructive pulmonary disease) ([place])   . Fecal occult blood test positive 10/30/2015  . Hemorrhoids 10/30/2015  . Hypertension   . Hypothyroidism   . Large breasts 10/30/2015   44 DD  . Postmenopausal 10/30/2015  . Sleep apnea    pt DX but did not want to use CPAP.  Marland Kitchen Thyroid disease     Past Surgical History:  Procedure Laterality Date  . COLONOSCOPY N/A 11/12/2015   Procedure: COLONOSCOPY;  Surgeon: Danie Binder, MD;  [facility];  Service: Endoscopy;  Laterality: N/A;  8:30 AM  . KNEE ARTHROSCOPY WITH MEDIAL MENISECTOMY Left 05/04/2015   Procedure: KNEE ARTHROSCOPY WITH MEDIAL AND LATERAL MENISECTOMY;  Surgeon: Carole Civil, MD;  Location: [facility];  Service: Orthopedics;  Laterality: Left;  . NASAL SINUS SURGERY    . TOTAL KNEE ARTHROPLASTY Left 01/29/2017   Procedure: TOTAL KNEE ARTHROPLASTY;  Surgeon: Carole Civil, MD;  Location: [facility];  Service: Orthopedics;  Laterality: Left;    There were no vitals filed for this visit.      Subjective Assessment -  03/19/17 1302    Subjective Pt stated knee is feeling better today, pain scale 4/10 Lt knee soreness.     Pertinent History ; OA in right knee    Patient Stated Goals sleep better, walk better, stand longer    Currently in Pain? Yes   Pain Score 4    Pain Location Knee   Pain Orientation Left  Around knee cap   Pain Descriptors / Indicators Sore;Tightness   Pain Type Acute pain   Pain Onset 1 to 4 weeks ago   Pain Frequency Intermittent   Aggravating Factors  bending   Pain Relieving Factors ice   Effect of Pain on Daily Activities increases                         [facility] Adult PT Treatment/Exercise - 03/19/17 0001      Knee/Hip Exercises: Stretches   Sports administrator 3 reps;30 seconds   Quad Stretch Limitations prone with rope     Knee/Hip Exercises: Aerobic   Recumbent Bike seate 11 full revolutions 5 minutes     Knee/Hip Exercises: Standing   Lateral Step Up Left;10 reps;Hand Hold: 1   Lateral Step Up Limitations 7" bottom step   Forward Step Up Left;10 reps;Hand Hold: 1   Forward Step Up Limitations 7" bottom step   Stairs reciprocal  7 in with 1 HR 2RT     Knee/Hip Exercises: Supine   Heel Slides Left;10 reps   Knee Extension Limitations 1   Knee Flexion Limitations 108 following manua;     Knee/Hip Exercises: Prone   Hamstring Curl 15 reps   Contract/Relax to Increase Flexion 5 reps     Manual Therapy   Manual Therapy Edema management;Myofascial release   Manual therapy comments completed seperately from all other skilled interventions   Edema Management retro to decrease edema   Myofascial Release to tight fascia perimeter of knee                  PT Short Term Goals - 03/12/17 0916      PT SHORT TERM GOAL #1   Title Pt left knee flexion to be to 95 degrees to allow pt to sit for 45 minutes in comfort to eat at a restaurant.    Baseline 06/21:  AROM Lt knee 106 degrees flexion; able to sit 1 hour comfortable   Status Achieved      PT SHORT TERM GOAL #2   Title Pt to be walking inside without a cane.   Baseline 6/21: Ambulated without AD indoors, does walk with SPC with some outdoor for safety   Status Achieved     PT SHORT TERM GOAL #3   Title PT pain in her left knee to be no greater than a 5/10 to allow pt to be able to stand for 15 minutes at a time to be able to shower.    Baseline 6/21:  Able to stand 15-20 minutes, able to shower, average pain scale 5/10   Status Partially Met     PT SHORT TERM GOAL #4   Title Pt to be able to walk for 20 minutes without increased left knee pain to be able to complete short shopping trips.    Baseline 6/21:  able to walk for 15-20 minutes with reports of knee tightness   Status Partially Met     PT SHORT TERM GOAL #5   Title Pt to be waking only one time a night    Baseline 06/21: Average waking 2-3 times a night to use restroom, wakes twice a night due to pain   Status On-going           PT Long Term Goals - 03/12/17 0921      PT LONG TERM GOAL #1   Title Pt  left knee pain level to be no greater than a 2/10 to allow pt to be able to walk for an hour without difficulty.    Status On-going     PT LONG TERM GOAL #2   Title Pt to be walking without any assistive device both in and outside.    Baseline 6/21: Ambulated without AD indoors, does walk with SPC with some outdoor for safety   Status Partially Met     PT LONG TERM GOAL #3   Title Pt left knee extension to be no greater than a 2 to allow normalized gait.    Baseline 06/21 1-106 degrees   Status Achieved     PT LONG TERM GOAL #4   Title Pt left knee flexion to be at least 115 to be able to squat to pick items off the floor.    Status On-going     PT LONG TERM GOAL #5   Title Pt left LE strength to be increased by a grade to allow pt to  come sit to stand without difficulty and to go up 4 steps in a reciprocal manner.    Baseline 06/21:    Status On-going     PT LONG TERM GOAL #6   Title PT  to be  sleeping throughout the night    Status On-going               Plan - 03/19/17 1336    Clinical Impression Statement Continued primary focus with AROM and functional strengthening.  Continued with excessive manual technqiues to decrease tightness and reduce adhesions on incision.  Added contract relax to improve flexion.  EOS improved AROM 108 degrees flexion.  Rt LE with 120 degrees flexion pt reports following removal of fluid.  EOS on recumbent on bike.     Rehab Potential Good   PT Frequency 3x / week   PT Duration 6 weeks   PT Treatment/Interventions ADLs/Self Care Home Management;Cryotherapy;Electrical Stimulation;Gait training;Stair training;Functional mobility training;Therapeutic activities;Therapeutic exercise;Balance training;Patient/family education;Manual techniques   PT Next Visit Plan Focus on ROM primarily and progress funtional strengthening as this improves to WNL. Continue manual as needed.  Measure progress with ROM each visit.  continue to improve upon reciprocal stair training.  Work on single leg balance exericses.    PT Home Exercise Plan heel raises; squats, quad sets, heel slides, hamstring stretch, hip flexion,abduction and extension.       Patient will benefit from skilled therapeutic intervention in order to improve the following deficits and impairments:  Abnormal gait, Decreased activity tolerance, Decreased balance, Decreased strength, Difficulty walking, Pain, Increased edema, Decreased range of motion  Visit Diagnosis: Stiffness of left knee  Acute pain of left knee  Muscle weakness (generalized)  Other abnormalities of gait and mobility     Problem List Patient Active Problem List   Diagnosis Date Noted  . Primary osteoarthritis of left knee   . Special screening for malignant neoplasms, colon   . Hemorrhoids 10/30/2015  . Fecal occult blood test positive 10/30/2015  . Postmenopausal 10/30/2015  . Large breasts 10/30/2015  . Bilateral  shoulder pain 10/30/2015  . Back pain 10/30/2015  . Medial meniscus tear   . Lateral meniscus tear, current   . Erosive lichen planus of vulva 08/28/2014  . 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 Right hip flexor tightness 12/06/2013  . Plantar fasciitis 12/06/2013   Ihor Austin, [place]; [place]  Aldona Lento 03/19/2017, 2:13 PM  [facility] [address] [place], [place], [address] Phone: [phone]   Fax:  [phone]  Name: [name] MRN: [id number] Date of Birth: 1966/08/14

## 2017-03-20 ENCOUNTER — Ambulatory Visit (HOSPITAL_COMMUNITY): Payer: BLUE CROSS/BLUE SHIELD | Admitting: Physical Therapy

## 2017-03-20 DIAGNOSIS — M6281 Muscle weakness (generalized): Secondary | ICD-10-CM | POA: Diagnosis not present

## 2017-03-20 DIAGNOSIS — R2689 Other abnormalities of gait and mobility: Secondary | ICD-10-CM

## 2017-03-20 DIAGNOSIS — M25562 Pain in left knee: Secondary | ICD-10-CM | POA: Diagnosis not present

## 2017-03-20 DIAGNOSIS — M25662 Stiffness of left knee, not elsewhere classified: Secondary | ICD-10-CM | POA: Diagnosis not present

## 2017-03-20 NOTE — Therapy (Signed)
[place], [place], [address] Phone: [phone]   Fax:  [phone]  Physical Therapy Treatment/ Reassessment  Patient Details  Name: [name] MRN: [id number] Date of Birth: [birth year] Referring Provider: Arther Abbott   Encounter Date: 03/20/2017      PT End of Session - 03/20/17 1029    Visit Number 12   Number of Visits 18   Date for PT Re-Evaluation 03/20/17   Authorization Type [employer]   Authorization - Visit Number 12   Authorization - Number of Visits 18   PT Start Time 0945   PT Stop Time 8032   PT Time Calculation (min) 43 min   Activity Tolerance Patient tolerated treatment well;No increased pain   Behavior During Therapy WFL for tasks assessed/performed      Past Medical History:  Diagnosis Date  . Arthritis   . Back pain 10/30/2015  . Bilateral shoulder pain 10/30/2015  . COPD (chronic obstructive pulmonary disease) ([place])   . Fecal occult blood test positive 10/30/2015  . Hemorrhoids 10/30/2015  . Hypertension   . Hypothyroidism   . Large breasts 10/30/2015   44 DD  . Postmenopausal 10/30/2015  . Sleep apnea    pt DX but did not want to use CPAP.  Marland Kitchen Thyroid disease     Past Surgical History:  Procedure Laterality Date  . COLONOSCOPY N/A 11/12/2015   Procedure: COLONOSCOPY;  Surgeon: Danie Binder, MD;  [facility];  Service: Endoscopy;  Laterality: N/A;  8:30 AM  . KNEE ARTHROSCOPY WITH MEDIAL MENISECTOMY Left 05/04/2015   Procedure: KNEE ARTHROSCOPY WITH MEDIAL AND LATERAL MENISECTOMY;  Surgeon: Carole Civil, MD;  Location: [facility];  Service: Orthopedics;  Laterality: Left;  . NASAL SINUS SURGERY    . TOTAL KNEE ARTHROPLASTY Left 01/29/2017   Procedure: TOTAL KNEE ARTHROPLASTY;  Surgeon: Carole Civil, MD;  Location: [facility];  Service: Orthopedics;  Laterality: Left;    There were no vitals filed for this visit.      Subjective Assessment - 03/20/17 0952    Subjective Pt states that she was looking forward to her massage today.  Pt states that stairs are still a little tricky for her but she also having pain in her right knee    Pertinent History ; OA in right knee    How long can you sit comfortably? able to sit as long as she wants was 30 minutes    How long can you stand comfortably? 30 minutes or longer was less than five    How long can you walk comfortably? Walking without an assistive device for 30 minutes was using a cane and walking for 10  minutes    Patient Stated Goals sleep better, walk better, stand longer    Currently in Pain? Yes   Pain Score 6    Pain Location Knee   Pain Orientation Left   Pain Descriptors / Indicators Aching   Pain Type Acute pain   Pain Onset 1 to 4 weeks ago   Pain Frequency Intermittent   Aggravating Factors  driving   Pain Relieving Factors medication, massage   Effect of Pain on Daily Activities varies             [facility] PT Assessment - 03/20/17 0001      Assessment   Medical Diagnosis Lt TKR   Referring Provider Arther Abbott    Onset Date/Surgical Date 01/28/17   Next MD  Visit 04/17/2017   Prior Therapy acute and home health      Precautions   Precautions None     Restrictions   Weight Bearing Restrictions No     Balance Screen   Has the patient fallen in the past 6 months No   Has the patient had a decrease in activity level because of a fear of falling?  Yes   Is the patient reluctant to leave their home because of a fear of falling?  No     Home Environment   Type of Home Mobile home   Home Access Stairs to enter   Entrance Stairs-Number of Steps 4   Entrance Stairs-Rails Right;Left     Prior Function   Level of Independence Independent   Vocation Full time employment   Vocation Requirements CNA- 8 hours, lots of walking and lifting      Cognition   Overall Cognitive Status Within Functional Limits for tasks assessed     Observation/Other Assessments   Focus on  Therapeutic Outcomes (FOTO)  42     Observation/Other Assessments-Edema    Edema Circumferential     Circumferential Edema   Circumferential - Right 44.5   Circumferential - Left  47     Functional Tests   Functional tests Single leg stance;Sit to Stand     Single Leg Stance   Comments Rt 32 seconds was 20; : Lt 56 was 23 seconds      Sit to Stand   Comments 5 x 30.83     AROM   Left Knee Extension 2  was 5    Left Knee Flexion 108  was 74      Strength   Left Hip Flexion 5/5  was 4-/5    Left Hip Extension 3+/5  was 3+/5 Rt is 3+/5 this is normal for patient    Left Hip ABduction 5/5  was 3+/5   Right Knee Extension 4-/5   Left Knee Flexion 5/5  was 3-/5    Left Knee Extension 5/5  was 3/5    Right Ankle Dorsiflexion 5/5   Left Ankle Dorsiflexion 5/5                     [facility] Adult PT Treatment/Exercise - 03/20/17 0001      Knee/Hip Exercises: Stretches   Quad Stretch 3 reps;30 seconds     Knee/Hip Exercises: Seated   Long Arc Quad 10 reps   Heel Slides 10 reps     Knee/Hip Exercises: Prone   Hip Extension Both;10 reps     Manual Therapy   Manual Therapy Edema management;Myofascial release   Manual therapy comments completed seperately from all other skilled interventions   Edema Management retro to decrease edema   Myofascial Release to tight fascia perimeter of knee                  PT Short Term Goals - 03/20/17 1013      PT SHORT TERM GOAL #1   Title Pt left knee flexion to be to 95 degrees to allow pt to sit for 45 minutes in comfort to eat at a restaurant.    Baseline 06/21:  AROM Lt knee 106 degrees flexion; able to sit 1 hour comfortable   Status Achieved     PT SHORT TERM GOAL #2   Title Pt to be walking inside without a cane.   Baseline 6/21: Ambulated without AD indoors, does walk with SPC with  some outdoor for safety   Status Achieved     PT SHORT TERM GOAL #3   Title PT pain in her left knee to be no greater  than a 5/10 to allow pt to be able to stand for 15 minutes at a time to be able to shower.    Baseline 6/21:  Able to stand 15-20 minutes, able to shower, average pain scale 5/10 function is achieved    Status Achieved     PT SHORT TERM GOAL #4   Title Pt to be able to walk for 20 minutes without increased left knee pain to be able to complete short shopping trips.    Status Achieved     PT SHORT TERM GOAL #5   Title Pt to be waking only one time a night    Baseline 6/29 wakes one time a night    Status Achieved           PT Long Term Goals - 03/20/17 1014      PT LONG TERM GOAL #1   Title Pt  left knee pain level to be no greater than a 2/10 to allow pt to be able to walk for an hour without difficulty.    Status On-going     PT LONG TERM GOAL #2   Title Pt to be walking without any assistive device both in and outside.    Baseline 6/29: walking without assistive device    Status Achieved     PT LONG TERM GOAL #3   Title Pt left knee extension to be no greater than a 2 to allow normalized gait.    Baseline 06/21 1-106 degrees   Status Achieved     PT LONG TERM GOAL #4   Title Pt left knee flexion to be at least 115 to be able to squat to pick items off the floor.    Status On-going     PT LONG TERM GOAL #5   Title Pt left LE strength to be increased by a grade to allow pt to come sit to stand without difficulty and to go up 4 steps in a reciprocal manner.    Baseline 06/29: strength allows sit to stand and steps limitation is pain    Status Achieved     PT LONG TERM GOAL #6   Title PT  to be sleeping throughout the night    Baseline PT wakes due to pain    Status Revised               Plan - 03/20/17 1030    Clinical Impression Statement Pt reassessed with most goals met.  Pt limitation is edema causing decreased ROM.  Pt encouraged to ice at home.  Pt also has weakened glut max strength B which will be addressed.  Pt concerned about the amount of walking  she will need to do when she returns to work.  Therapist requested pt to keep track of her steps over the weekend.  Find her average then increased 200 steps every day until she is walking at least 10,000 steps a day.  Pt states average day at work will be walking 13,000.    Rehab Potential Good   PT Frequency 3x / week   PT Duration 6 weeks   PT Treatment/Interventions ADLs/Self Care Home Management;Cryotherapy;Electrical Stimulation;Gait training;Stair training;Functional mobility training;Therapeutic activities;Therapeutic exercise;Balance training;Patient/family education;Manual techniques   PT Next Visit Plan No more balance.  Work glut max only B ie sit to  stand, steps; prone SLR; edema control and flexion activity.    PT Home Exercise Plan heel raises; squats, quad sets, heel slides, hamstring stretch, hip flexion,abduction and extension.       Patient will benefit from skilled therapeutic intervention in order to improve the following deficits and impairments:  Abnormal gait, Decreased activity tolerance, Decreased balance, Decreased strength, Difficulty walking, Pain, Increased edema, Decreased range of motion  Visit Diagnosis: Stiffness of left knee  Acute pain of left knee  Muscle weakness (generalized)  Other abnormalities of gait and mobility     Problem List Patient Active Problem List   Diagnosis Date Noted  . Primary osteoarthritis of left knee   . Special screening for malignant neoplasms, colon   . Hemorrhoids 10/30/2015  . Fecal occult blood test positive 10/30/2015  . Postmenopausal 10/30/2015  . Large breasts 10/30/2015  . Bilateral shoulder pain 10/30/2015  . Back pain 10/30/2015  . Medial meniscus tear   . Lateral meniscus tear, current   . Erosive lichen planus of vulva 08/28/2014  . 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 Right hip flexor tightness 12/06/2013  . Plantar fasciitis 12/06/2013   Rayetta Humphrey, PT CLT [phone] 03/20/2017, 10:34 AM  [facility] [address] [place], [place], [address] Phone: [phone]   Fax:  [phone]  Name: DEVANIE GALANTI MRN: [id number] Date of Birth: 1966/06/30

## 2017-03-24 ENCOUNTER — Ambulatory Visit (HOSPITAL_COMMUNITY): Payer: BLUE CROSS/BLUE SHIELD | Attending: Orthopedic Surgery | Admitting: Physical Therapy

## 2017-03-24 DIAGNOSIS — M6281 Muscle weakness (generalized): Secondary | ICD-10-CM | POA: Diagnosis not present

## 2017-03-24 DIAGNOSIS — R2689 Other abnormalities of gait and mobility: Secondary | ICD-10-CM

## 2017-03-24 DIAGNOSIS — M25662 Stiffness of left knee, not elsewhere classified: Secondary | ICD-10-CM

## 2017-03-24 DIAGNOSIS — M25562 Pain in left knee: Secondary | ICD-10-CM | POA: Diagnosis not present

## 2017-03-24 NOTE — Therapy (Signed)
[facility] [address] [place], [place], [address] Phone: [phone]   Fax:  [phone]  Physical Therapy Treatment  Patient Details  Name: [name] MRN: [id number] Date of Birth: [birth year] Referring Provider: [name]   Encounter Date: 03/24/2017      PT End of Session - 03/24/17 1451    Visit Number 13   Number of Visits 18   Date for PT Re-Evaluation 03/20/17   Authorization Type BCBS   Authorization - Visit Number 13   Authorization - Number of Visits 18   PT Start Time 1300   PT Stop Time 1345   PT Time Calculation (min) 45 min   Activity Tolerance Patient tolerated treatment well;No increased pain   Behavior During Therapy WFL for tasks assessed/performed      Past Medical History:  Diagnosis Date  . Arthritis   . Back pain 10/30/2015  . Bilateral shoulder pain 10/30/2015  . COPD (chronic obstructive pulmonary disease) (HCC)   . Fecal occult blood test positive 10/30/2015  . Hemorrhoids 10/30/2015  . Hypertension   . Hypothyroidism   . Large breasts 10/30/2015   44 DD  . Postmenopausal 10/30/2015  . Sleep apnea    pt DX but did not want to use CPAP.  Marland Kitchen Thyroid disease     Past Surgical History:  Procedure Laterality Date  . COLONOSCOPY N/A 11/12/2015   Procedure: COLONOSCOPY;  Surgeon: West Bali, MD;  [facility]: [facility];  Service: Endoscopy;  Laterality: N/A;  8:30 AM  . KNEE ARTHROSCOPY WITH MEDIAL MENISECTOMY Left 05/04/2015   Procedure: KNEE ARTHROSCOPY WITH MEDIAL AND LATERAL MENISECTOMY;  Surgeon: Vickki Hearing, MD;  Location: [facility];  Service: Orthopedics;  Laterality: Left;  . NASAL SINUS SURGERY    . TOTAL KNEE ARTHROPLASTY Left 01/29/2017   Procedure: TOTAL KNEE ARTHROPLASTY;  Surgeon: Vickki Hearing, MD;  Location: [facility];  Service: Orthopedics;  Laterality: Left;    There were no vitals filed for this visit.      Subjective Assessment - 03/24/17 1306    Subjective Pt states  she has been working out this morning walking and doing her exercises.  Currently 2/10.                           [facility] Adult PT Treatment/Exercise - 03/24/17 0001      Knee/Hip Exercises: Stretches   Active Hamstring Stretch Left;3 reps;30 seconds   Active Hamstring Stretch Limitations 12" box   Knee: Self-Stretch to increase Flexion Left;10 seconds   Knee: Self-Stretch Limitations Knee drives on 12in step 10x 10" holds   Gastroc Stretch 3 reps;30 seconds   Gastroc Stretch Limitations slant board     Knee/Hip Exercises: Standing   Lateral Step Up Left;10 reps;Hand Hold: 1   Lateral Step Up Limitations 7" bottom step   Forward Step Up Left;10 reps;Hand Hold: 1   Forward Step Up Limitations 7" bottom step   Stairs reciprocal 7 in with 1 HR 5RT   SLS with Vectors 5 X 3" on Lt only     Knee/Hip Exercises: Seated   Sit to Sand 10 reps;without UE support     Knee/Hip Exercises: Supine   Heel Slides Left;10 reps   Knee Flexion Limitations 105 prior to and 110 following manua;     Manual Therapy   Manual Therapy Edema management;Myofascial release   Manual therapy comments completed seperately from all  other skilled interventions   Edema Management retro to decrease edema   Myofascial Release to tight fascia perimeter of knee                  PT Short Term Goals - 03/20/17 1013      PT SHORT TERM GOAL #1   Title Pt left knee flexion to be to 95 degrees to allow pt to sit for 45 minutes in comfort to eat at a restaurant.    Baseline 06/21:  AROM Lt knee 106 degrees flexion; able to sit 1 hour comfortable   Status Achieved     PT SHORT TERM GOAL #2   Title Pt to be walking inside without a cane.   Baseline 6/21: Ambulated without AD indoors, does walk with SPC with some outdoor for safety   Status Achieved     PT SHORT TERM GOAL #3   Title PT pain in her left knee to be no greater than a 5/10 to allow pt to be able to stand for 15 minutes at a time  to be able to shower.    Baseline 6/21:  Able to stand 15-20 minutes, able to shower, average pain scale 5/10 function is achieved    Status Achieved     PT SHORT TERM GOAL #4   Title Pt to be able to walk for 20 minutes without increased left knee pain to be able to complete short shopping trips.    Status Achieved     PT SHORT TERM GOAL #5   Title Pt to be waking only one time a night    Baseline 6/29 wakes one time a night    Status Achieved           PT Long Term Goals - 03/20/17 1014      PT LONG TERM GOAL #1   Title Pt  left knee pain level to be no greater than a 2/10 to allow pt to be able to walk for an hour without difficulty.    Status On-going     PT LONG TERM GOAL #2   Title Pt to be walking without any assistive device both in and outside.    Baseline 6/29: walking without assistive device    Status Achieved     PT LONG TERM GOAL #3   Title Pt left knee extension to be no greater than a 2 to allow normalized gait.    Baseline 06/21 1-106 degrees   Status Achieved     PT LONG TERM GOAL #4   Title Pt left knee flexion to be at least 115 to be able to squat to pick items off the floor.    Status On-going     PT LONG TERM GOAL #5   Title Pt left LE strength to be increased by a grade to allow pt to come sit to stand without difficulty and to go up 4 steps in a reciprocal manner.    Baseline 06/29: strength allows sit to stand and steps limitation is pain    Status Achieved     PT LONG TERM GOAL #6   Title PT  to be sleeping throughout the night    Baseline PT wakes due to pain    Status Revised               Plan - 03/24/17 1445    Clinical Impression Statement Continued functional exercises, mainly with steps as she voiced these are most difficult for her.  Noted difficulty completing 7" reciprocally with only 1 HR and cues to keep back straight and control descent with lateral step downs.  Added vector stance to further work on NIKEglute stabilty.   manual continued to work on decreasing tight adhesions and edema still present in knee that is heeding ROM.  Able to achieve 110 degrees flexion following manual .   Rehab Potential Good   PT Frequency 3x / week   PT Duration 6 weeks   PT Treatment/Interventions ADLs/Self Care Home Management;Cryotherapy;Electrical Stimulation;Gait training;Stair training;Functional mobility training;Therapeutic activities;Therapeutic exercise;Balance training;Patient/family education;Manual techniques   PT Next Visit Plan Work glut max only per latest re-evaluation.   Next session add prone SLR.   PT Home Exercise Plan heel raises; squats, quad sets, heel slides, hamstring stretch, hip flexion,abduction and extension.       Patient will benefit from skilled therapeutic intervention in order to improve the following deficits and impairments:  Abnormal gait, Decreased activity tolerance, Decreased balance, Decreased strength, Difficulty walking, Pain, Increased edema, Decreased range of motion  Visit Diagnosis: Stiffness of left knee  Acute pain of left knee  Muscle weakness (generalized)  Other abnormalities of gait and mobility     Problem List Patient Active Problem List   Diagnosis Date Noted  . Primary osteoarthritis of left knee   . Special screening for malignant neoplasms, colon   . Hemorrhoids 10/30/2015  . Fecal occult blood test positive 10/30/2015  . Postmenopausal 10/30/2015  . Large breasts 10/30/2015  . Bilateral shoulder pain 10/30/2015  . Back pain 10/30/2015  . Medial meniscus tear   . Lateral meniscus tear, current   . Erosive lichen planus of vulva 08/28/2014  . 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 Right hip flexor tightness 12/06/2013  . Plantar fasciitis 12/06/2013    Lurena Nidamy B Frazier, PTA/CLT [phone]  03/24/2017, 2:52 PM  [facility] [address] [place], [place], [address] Phone: [phone]   Fax:  [phone]  Name:  [name] MRN: [id number] Date of Birth: 11/08/1965

## 2017-03-26 ENCOUNTER — Ambulatory Visit (HOSPITAL_COMMUNITY): Payer: BLUE CROSS/BLUE SHIELD

## 2017-03-26 DIAGNOSIS — M6281 Muscle weakness (generalized): Secondary | ICD-10-CM | POA: Diagnosis not present

## 2017-03-26 DIAGNOSIS — M25662 Stiffness of left knee, not elsewhere classified: Secondary | ICD-10-CM | POA: Diagnosis not present

## 2017-03-26 DIAGNOSIS — R2689 Other abnormalities of gait and mobility: Secondary | ICD-10-CM

## 2017-03-26 DIAGNOSIS — M25562 Pain in left knee: Secondary | ICD-10-CM

## 2017-03-26 NOTE — Therapy (Signed)
[facility] [address], [place], [address] Phone: [phone]   Fax:  [phone]  Physical Therapy Treatment  Patient Details  Name: [name] MRN: [id number] Date of Birth: 04/30/1966 Referring Provider: Fuller CanadaStanley Harrison   Encounter Date: 03/26/2017      PT End of Session - 03/26/17 1309    Visit Number 14   Number of Visits 18   Date for PT Re-Evaluation 03/20/17   Authorization Type BC[employer]   Authorization - Visit Number 14   Authorization - Number of Visits 18   PT Start Time 1300   PT Stop Time 1343   PT Time Calculation (min) 43 min   Activity Tolerance Patient tolerated treatment well;No increased pain   Behavior During Therapy WFL for tasks assessed/performed      Past Medical History:  Diagnosis Date  . Arthritis   . Back pain 10/30/2015  . Bilateral shoulder pain 10/30/2015  . COPD (chronic obstructive pulmonary disease) (HCC)   . Fecal occult blood test positive 10/30/2015  . Hemorrhoids 10/30/2015  . Hypertension   . Hypothyroidism   . Large breasts 10/30/2015   44 DD  . Postmenopausal 10/30/2015  . Sleep apnea    pt DX but did not want to use CPAP.  Marland Kitchen. Thyroid disease     Past Surgical History:  Procedure Laterality Date  . COLONOSCOPY N/A 11/12/2015   Procedure: COLONOSCOPY;  Surgeon: West BaliSandi L Fields, MD;  [facility];  Service: Endoscopy;  Laterality: N/A;  8:30 AM  . KNEE ARTHROSCOPY WITH MEDIAL MENISECTOMY Left 05/04/2015   Procedure: KNEE ARTHROSCOPY WITH MEDIAL AND LATERAL MENISECTOMY;  Surgeon: Vickki HearingStanley E Harrison, MD;  Location: [facility];  Service: Orthopedics;  Laterality: Left;  . NASAL SINUS SURGERY    . TOTAL KNEE ARTHROPLASTY Left 01/29/2017   Procedure: TOTAL KNEE ARTHROPLASTY;  Surgeon: Vickki HearingHarrison, Stanley E, MD;  Location: [facility];  Service: Orthopedics;  Laterality: Left;    There were no vitals filed for this visit.      Subjective Assessment - 03/26/17 1305    Subjective Pt stated  knee is tight today, current 2/10.  Reports main difficulty completeing steps    Pertinent History ; OA in right knee    Patient Stated Goals sleep better, walk better, stand longer    Currently in Pain? Yes   Pain Score 2    Pain Location Knee   Pain Orientation Left   Pain Descriptors / Indicators Tightness   Pain Type Acute pain   Pain Onset 1 to 4 weeks ago   Pain Frequency Intermittent   Aggravating Factors  driving    Pain Relieving Factors medication, massage   Effect of Pain on Daily Activities varies                         [facility] Adult PT Treatment/Exercise - 03/26/17 0001      Knee/Hip Exercises: Stretches   Active Hamstring Stretch Left;3 reps;30 seconds   Active Hamstring Stretch Limitations supine wiht sheet   Quad Stretch 3 reps;30 seconds   Quad Stretch Limitations prone with rope   Knee: Self-Stretch to increase Flexion Left;10 seconds   Knee: Self-Stretch Limitations Knee drives on 12in step 10x 10" holds     Knee/Hip Exercises: Standing   Lateral Step Up Left;15 reps   Lateral Step Up Limitations 7" bottom step   Functional Squat 15 reps   Stairs reciprocal 7 in  with 1 HR 5RT   SLS with Vectors 5 X 3" on Lt only     Knee/Hip Exercises: Seated   Sit to Sand 15 reps;without UE support  cueing for eccentric control and posture during task     Knee/Hip Exercises: Supine   Knee Flexion Limitations 105 prior to and 113 following manual     Knee/Hip Exercises: Prone   Hip Extension Both;15 reps     Manual Therapy   Manual Therapy Edema management;Myofascial release   Manual therapy comments completed seperately from all other skilled interventions   Edema Management retro to decrease edema   Myofascial Release to tight fascia perimeter of knee                  PT Short Term Goals - 03/20/17 1013      PT SHORT TERM GOAL #1   Title Pt left knee flexion to be to 95 degrees to allow pt to sit for 45 minutes in comfort to eat at a  restaurant.    Baseline 06/21:  AROM Lt knee 106 degrees flexion; able to sit 1 hour comfortable   Status Achieved     PT SHORT TERM GOAL #2   Title Pt to be walking inside without a cane.   Baseline 6/21: Ambulated without AD indoors, does walk with SPC with some outdoor for safety   Status Achieved     PT SHORT TERM GOAL #3   Title PT pain in her left knee to be no greater than a 5/10 to allow pt to be able to stand for 15 minutes at a time to be able to shower.    Baseline 6/21:  Able to stand 15-20 minutes, able to shower, average pain scale 5/10 function is achieved    Status Achieved     PT SHORT TERM GOAL #4   Title Pt to be able to walk for 20 minutes without increased left knee pain to be able to complete short shopping trips.    Status Achieved     PT SHORT TERM GOAL #5   Title Pt to be waking only one time a night    Baseline 6/29 wakes one time a night    Status Achieved           PT Long Term Goals - 03/20/17 1014      PT LONG TERM GOAL #1   Title Pt  left knee pain level to be no greater than a 2/10 to allow pt to be able to walk for an hour without difficulty.    Status On-going     PT LONG TERM GOAL #2   Title Pt to be walking without any assistive device both in and outside.    Baseline 6/29: walking without assistive device    Status Achieved     PT LONG TERM GOAL #3   Title Pt left knee extension to be no greater than a 2 to allow normalized gait.    Baseline 06/21 1-106 degrees   Status Achieved     PT LONG TERM GOAL #4   Title Pt left knee flexion to be at least 115 to be able to squat to pick items off the floor.    Status On-going     PT LONG TERM GOAL #5   Title Pt left LE strength to be increased by a grade to allow pt to come sit to stand without difficulty and to go up 4 steps in a reciprocal  manner.    Baseline 06/29: strength allows sit to stand and steps limitation is pain    Status Achieved     PT LONG TERM GOAL #6   Title PT  to be  sleeping throughout the night    Baseline PT wakes due to pain    Status Revised               Plan - 03/26/17 1346    Clinical Impression Statement Session focus on knee mobility and functional strengthening for steps as well as gluteal strengthening.  Resumed prone hip extension for gluteal strengthening with cueing for form prior task for proper muscle activation.  Cueing to improve mechanics with stair training especially while descending steps to improve eccentric control and reduce trunk rotation to improve knee mobilty.  Manual technqiues complete to address myofascial restrictions on incision and retro massage for edema control.  Improved from 105 to 113 degrees following manual.  No reports of increased pain through session.     Rehab Potential Good   PT Frequency 3x / week   PT Duration 6 weeks   PT Treatment/Interventions ADLs/Self Care Home Management;Cryotherapy;Electrical Stimulation;Gait training;Stair training;Functional mobility training;Therapeutic activities;Therapeutic exercise;Balance training;Patient/family education;Manual techniques   PT Next Visit Plan Therex strengthening glut max following last re-eval.  continue to work on knee flexion ROM and stair training to improve mechanics.  Add wall squats next session for gluteal strengthening.     PT Home Exercise Plan heel raises; squats, quad sets, heel slides, hamstring stretch, hip flexion,abduction and extension.       Patient will benefit from skilled therapeutic intervention in order to improve the following deficits and impairments:  Abnormal gait, Decreased activity tolerance, Decreased balance, Decreased strength, Difficulty walking, Pain, Increased edema, Decreased range of motion  Visit Diagnosis: Stiffness of left knee  Acute pain of left knee  Muscle weakness (generalized)  Other abnormalities of gait and mobility     Problem List Patient Active Problem List   Diagnosis Date Noted  . Primary  osteoarthritis of left knee   . Special screening for malignant neoplasms, colon   . Hemorrhoids 10/30/2015  . Fecal occult blood test positive 10/30/2015  . Postmenopausal 10/30/2015  . Large breasts 10/30/2015  . Bilateral shoulder pain 10/30/2015  . Back pain 10/30/2015  . Medial meniscus tear   . Lateral meniscus tear, current   . Erosive lichen planus of vulva 08/28/2014  . 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 Right hip flexor tightness 12/06/2013  . Plantar fasciitis 12/06/2013   Becky Sax, LPTA; CBIS [phone]  Juel Burrow 03/26/2017, 1:55 PM  [facility] [address] [place], [place], [address] Phone: [phone]   Fax:  [phone]  Name: [name] MRN: [id number] Date of Birth: 1966-02-01

## 2017-03-31 ENCOUNTER — Ambulatory Visit (HOSPITAL_COMMUNITY): Payer: BLUE CROSS/BLUE SHIELD

## 2017-03-31 DIAGNOSIS — R2689 Other abnormalities of gait and mobility: Secondary | ICD-10-CM

## 2017-03-31 DIAGNOSIS — M25562 Pain in left knee: Secondary | ICD-10-CM | POA: Diagnosis not present

## 2017-03-31 DIAGNOSIS — M25662 Stiffness of left knee, not elsewhere classified: Secondary | ICD-10-CM

## 2017-03-31 DIAGNOSIS — M6281 Muscle weakness (generalized): Secondary | ICD-10-CM | POA: Diagnosis not present

## 2017-03-31 NOTE — Therapy (Signed)
[facility] [address] [place], [place], [address] Phone: [phone]   Fax:  [phone]  Physical Therapy Treatment  Patient Details  Name: Connie DresserJoan E Wegmann MRN: [id number] Date of Birth: 01/18/1966 Referring Provider: Fuller CanadaStanley Harrison   Encounter Date: 03/31/2017      PT End of Session - 03/31/17 1452    Visit Number 15   Number of Visits 18   Date for PT Re-Evaluation 04/05/17   Authorization Type BCBS   Authorization - Visit Number 15   PT Start Time 1432   PT Stop Time 1513   PT Time Calculation (min) 41 min   Activity Tolerance Patient tolerated treatment well;No increased pain   Behavior During Therapy WFL for tasks assessed/performed      Past Medical History:  Diagnosis Date  . Arthritis   . Back pain 10/30/2015  . Bilateral shoulder pain 10/30/2015  . COPD (chronic obstructive pulmonary disease) (HCC)   . Fecal occult blood test positive 10/30/2015  . Hemorrhoids 10/30/2015  . Hypertension   . Hypothyroidism   . Large breasts 10/30/2015   44 DD  . Postmenopausal 10/30/2015  . Sleep apnea    pt DX but did not want to use CPAP.  Marland Kitchen. Thyroid disease     Past Surgical History:  Procedure Laterality Date  . COLONOSCOPY N/A 11/12/2015   Procedure: COLONOSCOPY;  Surgeon: West BaliSandi L Fields, MD;  [facility];  Service: Endoscopy;  Laterality: N/A;  8:30 AM  . KNEE ARTHROSCOPY WITH MEDIAL MENISECTOMY Left 05/04/2015   Procedure: KNEE ARTHROSCOPY WITH MEDIAL AND LATERAL MENISECTOMY;  Surgeon: Vickki HearingStanley E Harrison, MD;  Location: [facility];  Service: Orthopedics;  Laterality: Left;  . NASAL SINUS SURGERY    . TOTAL KNEE ARTHROPLASTY Left 01/29/2017   Procedure: TOTAL KNEE ARTHROPLASTY;  Surgeon: Vickki HearingHarrison, Stanley E, MD;  Location: [facility];  Service: Orthopedics;  Laterality: Left;    There were no vitals filed for this visit.      Subjective Assessment - 03/31/17 1439    Subjective Pt stated knee is feeling good today, pain scale  2/10.   Patient Stated Goals sleep better, walk better, stand longer    Currently in Pain? Yes   Pain Score 2    Pain Location Knee   Pain Orientation Left   Pain Descriptors / Indicators Aching;Tightness   Pain Type Acute pain   Pain Onset 1 to 4 weeks ago   Pain Frequency Intermittent   Aggravating Factors  driving   Pain Relieving Factors medication, massage   Effect of Pain on Daily Activities varies                         [facility] Adult PT Treatment/Exercise - 03/31/17 0001      Knee/Hip Exercises: Stretches   LobbyistQuad Stretch 3 reps;30 seconds   Quad Stretch Limitations prone with rope   Knee: Self-Stretch to increase Flexion Left;10 seconds   Knee: Self-Stretch Limitations Knee drives on 12in step 10x 10" holds     Knee/Hip Exercises: Standing   Functional Squat 15 reps   Wall Squat 10 reps;3 seconds     Knee/Hip Exercises: Seated   Sit to Sand 15 reps;without UE support  eccentric control cueing     Knee/Hip Exercises: Supine   Knee Flexion Limitations 113 degrees following manual     Knee/Hip Exercises: Prone   Hip Extension Both;15 reps     Manual Therapy  Manual Therapy Edema management;Myofascial release   Manual therapy comments completed seperately from all other skilled interventions   Edema Management retro to decrease edema   Myofascial Release to tight fascia perimeter of knee                  PT Short Term Goals - 03/20/17 1013      PT SHORT TERM GOAL #1   Title Pt left knee flexion to be to 95 degrees to allow pt to sit for 45 minutes in comfort to eat at a restaurant.    Baseline 06/21:  AROM Lt knee 106 degrees flexion; able to sit 1 hour comfortable   Status Achieved     PT SHORT TERM GOAL #2   Title Pt to be walking inside without a cane.   Baseline 6/21: Ambulated without AD indoors, does walk with SPC with some outdoor for safety   Status Achieved     PT SHORT TERM GOAL #3   Title PT pain in her left knee to be  no greater than a 5/10 to allow pt to be able to stand for 15 minutes at a time to be able to shower.    Baseline 6/21:  Able to stand 15-20 minutes, able to shower, average pain scale 5/10 function is achieved    Status Achieved     PT SHORT TERM GOAL #4   Title Pt to be able to walk for 20 minutes without increased left knee pain to be able to complete short shopping trips.    Status Achieved     PT SHORT TERM GOAL #5   Title Pt to be waking only one time a night    Baseline 6/29 wakes one time a night    Status Achieved           PT Long Term Goals - 03/20/17 1014      PT LONG TERM GOAL #1   Title Pt  left knee pain level to be no greater than a 2/10 to allow pt to be able to walk for an hour without difficulty.    Status On-going     PT LONG TERM GOAL #2   Title Pt to be walking without any assistive device both in and outside.    Baseline 6/29: walking without assistive device    Status Achieved     PT LONG TERM GOAL #3   Title Pt left knee extension to be no greater than a 2 to allow normalized gait.    Baseline 06/21 1-106 degrees   Status Achieved     PT LONG TERM GOAL #4   Title Pt left knee flexion to be at least 115 to be able to squat to pick items off the floor.    Status On-going     PT LONG TERM GOAL #5   Title Pt left LE strength to be increased by a grade to allow pt to come sit to stand without difficulty and to go up 4 steps in a reciprocal manner.    Baseline 06/29: strength allows sit to stand and steps limitation is pain    Status Achieved     PT LONG TERM GOAL #6   Title PT  to be sleeping throughout the night    Baseline PT wakes due to pain    Status Revised               Plan - 03/31/17 1523    Clinical Impression Statement Continued  session focus with knee mobility and gluteal strengthening.  Increased time initially this session with manual techqniues to address myofascial restrictions on incision and retro massage for edema  control.  EOS improved knee flexion to 113 degrees.  No reoprts of pain through session.   Rehab Potential Good   PT Frequency 3x / week   PT Duration 6 weeks   PT Treatment/Interventions ADLs/Self Care Home Management;Cryotherapy;Electrical Stimulation;Gait training;Stair training;Functional mobility training;Therapeutic activities;Therapeutic exercise;Balance training;Patient/family education;Manual techniques   PT Next Visit Plan Therex strengthening glut max following last re-eval.  continue to work on knee flexion ROM and stair training to improve mechanics.  Add wall squats next session for gluteal strengthening.        Patient will benefit from skilled therapeutic intervention in order to improve the following deficits and impairments:  Abnormal gait, Decreased activity tolerance, Decreased balance, Decreased strength, Difficulty walking, Pain, Increased edema, Decreased range of motion  Visit Diagnosis: Stiffness of left knee  Acute pain of left knee  Muscle weakness (generalized)  Other abnormalities of gait and mobility     Problem List Patient Active Problem List   Diagnosis Date Noted  . Primary osteoarthritis of left knee   . Special screening for malignant neoplasms, colon   . Hemorrhoids 10/30/2015  . Fecal occult blood test positive 10/30/2015  . Postmenopausal 10/30/2015  . Large breasts 10/30/2015  . Bilateral shoulder pain 10/30/2015  . Back pain 10/30/2015  . Medial meniscus tear   . Lateral meniscus tear, current   . Erosive lichen planus of vulva 08/28/2014  . 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 Right hip flexor tightness 12/06/2013  . Plantar fasciitis 12/06/2013   Becky Sax, LPTA; CBIS [phone]  Juel Burrow 03/31/2017, 3:32 PM  [facility] [address] [place], [place], [address] Phone: [phone]   Fax:  [phone]  Name: NICO ROGNESS MRN: [id number] Date of Birth: Oct 19, 1965

## 2017-04-02 ENCOUNTER — Ambulatory Visit (HOSPITAL_COMMUNITY): Payer: BLUE CROSS/BLUE SHIELD | Admitting: Physical Therapy

## 2017-04-02 DIAGNOSIS — R2689 Other abnormalities of gait and mobility: Secondary | ICD-10-CM

## 2017-04-02 DIAGNOSIS — M6281 Muscle weakness (generalized): Secondary | ICD-10-CM | POA: Diagnosis not present

## 2017-04-02 DIAGNOSIS — M25662 Stiffness of left knee, not elsewhere classified: Secondary | ICD-10-CM

## 2017-04-02 DIAGNOSIS — M25562 Pain in left knee: Secondary | ICD-10-CM

## 2017-04-02 NOTE — Therapy (Signed)
[facility] [address] [place], [place], [address] Phone: [phone]   Fax:  [phone]  Physical Therapy Treatment  Patient Details  Name: [name] MRN: [id number] Date of Birth: 01/11/1966 Referring Provider: Fuller CanadaStanley Harrison   Encounter Date: 04/02/2017      PT End of Session - 04/02/17 1321    Visit Number 16   Number of Visits 18   Date for PT Re-Evaluation 04/05/17   Authorization Type BCBS   Authorization - Visit Number 16   Authorization - Number of Visits 18   PT Start Time 1300   PT Stop Time 1345   PT Time Calculation (min) 45 min   Activity Tolerance Patient tolerated treatment well;No increased pain   Behavior During Therapy WFL for tasks assessed/performed      Past Medical History:  Diagnosis Date  . Arthritis   . Back pain 10/30/2015  . Bilateral shoulder pain 10/30/2015  . COPD (chronic obstructive pulmonary disease) (HCC)   . Fecal occult blood test positive 10/30/2015  . Hemorrhoids 10/30/2015  . Hypertension   . Hypothyroidism   . Large breasts 10/30/2015   44 DD  . Postmenopausal 10/30/2015  . Sleep apnea    pt DX but did not want to use CPAP.  Marland Kitchen. Thyroid disease     Past Surgical History:  Procedure Laterality Date  . COLONOSCOPY N/A 11/12/2015   Procedure: COLONOSCOPY;  Surgeon: West BaliSandi L Fields, MD;  [facility];  Service: Endoscopy;  Laterality: N/A;  8:30 AM  . KNEE ARTHROSCOPY WITH MEDIAL MENISECTOMY Left 05/04/2015   Procedure: KNEE ARTHROSCOPY WITH MEDIAL AND LATERAL MENISECTOMY;  Surgeon: Vickki HearingStanley E Harrison, MD;  Location: [facility];  Service: Orthopedics;  Laterality: Left;  . NASAL SINUS SURGERY    . TOTAL KNEE ARTHROPLASTY Left 01/29/2017   Procedure: TOTAL KNEE ARTHROPLASTY;  Surgeon: Vickki HearingHarrison, Stanley E, MD;  Location: [facility];  Service: Orthopedics;  Laterality: Left;    There were no vitals filed for this visit.      Subjective Assessment - 04/02/17 1304    Subjective Pt states  that she is having more difficulty with her right knee than her left.     Pertinent History ; OA in right knee    How long can you sit comfortably? able to sit as long as she wants was 30 minutes    How long can you stand comfortably? 30 minutes or longer was less than five    How long can you walk comfortably? Walking without an assistive device for 30 minutes was using a cane and walking for 10  minutes    Patient Stated Goals sleep better, walk better, stand longer    Currently in Pain? Yes   Pain Score 2    Pain Location Knee   Pain Orientation Left   Pain Descriptors / Indicators Aching   Pain Type Acute pain   Pain Onset 1 to 4 weeks ago   Pain Frequency Intermittent   Aggravating Factors  bending    Pain Relieving Factors ice   Effect of Pain on Daily Activities increases    Multiple Pain Sites Yes   Pain Score 5   Pain Location Knee   Pain Orientation Right   Pain Descriptors / Indicators Aching   Pain Type Chronic pain   Pain Onset More than a month ago   Pain Frequency Constant   Aggravating Factors  activity    Pain Relieving Factors ice  Effect of Pain on Daily Activities increases                          [facility] Adult PT Treatment/Exercise - 04/02/17 0001      Knee/Hip Exercises: Stretches   Lobbyist 3 reps;30 seconds   Quad Stretch Limitations prone with rope   Knee: Self-Stretch to increase Flexion Left;10 seconds   Knee: Self-Stretch Limitations Knee drives on 12in step 10x 10" holds     Knee/Hip Exercises: Standing   Heel Raises Both;10 reps   Heel Raises Limitations repeat on left only    Functional Squat 15 reps   Wall Squat 10 reps;3 seconds   SLS Lt only 45:second x 5      Knee/Hip Exercises: Seated   Sit to Sand --  eccentric control cueing     Knee/Hip Exercises: Supine   Quad Sets Left;5 reps   Terminal Knee Extension Left;10 reps   Knee Flexion Limitations 113 degrees following manual     Knee/Hip Exercises: Prone    Hip Extension Both;15 reps   Hip Extension Limitations terminal knee extension x 10      Manual Therapy   Manual Therapy Edema management;Myofascial release   Manual therapy comments completed seperately from all other skilled interventions   Edema Management retro to decrease edema   Myofascial Release to tight fascia perimeter of knee                  PT Short Term Goals - 03/20/17 1013      PT SHORT TERM GOAL #1   Title Pt left knee flexion to be to 95 degrees to allow pt to sit for 45 minutes in comfort to eat at a restaurant.    Baseline 06/21:  AROM Lt knee 106 degrees flexion; able to sit 1 hour comfortable   Status Achieved     PT SHORT TERM GOAL #2   Title Pt to be walking inside without a cane.   Baseline 6/21: Ambulated without AD indoors, does walk with SPC with some outdoor for safety   Status Achieved     PT SHORT TERM GOAL #3   Title PT pain in her left knee to be no greater than a 5/10 to allow pt to be able to stand for 15 minutes at a time to be able to shower.    Baseline 6/21:  Able to stand 15-20 minutes, able to shower, average pain scale 5/10 function is achieved    Status Achieved     PT SHORT TERM GOAL #4   Title Pt to be able to walk for 20 minutes without increased left knee pain to be able to complete short shopping trips.    Status Achieved     PT SHORT TERM GOAL #5   Title Pt to be waking only one time a night    Baseline 6/29 wakes one time a night    Status Achieved           PT Long Term Goals - 03/20/17 1014      PT LONG TERM GOAL #1   Title Pt  left knee pain level to be no greater than a 2/10 to allow pt to be able to walk for an hour without difficulty.    Status On-going     PT LONG TERM GOAL #2   Title Pt to be walking without any assistive device both in and outside.  Baseline 6/29: walking without assistive device    Status Achieved     PT LONG TERM GOAL #3   Title Pt left knee extension to be no greater than a  2 to allow normalized gait.    Baseline 06/21 1-106 degrees   Status Achieved     PT LONG TERM GOAL #4   Title Pt left knee flexion to be at least 115 to be able to squat to pick items off the floor.    Status On-going     PT LONG TERM GOAL #5   Title Pt left LE strength to be increased by a grade to allow pt to come sit to stand without difficulty and to go up 4 steps in a reciprocal manner.    Baseline 06/29: strength allows sit to stand and steps limitation is pain    Status Achieved     PT LONG TERM GOAL #6   Title PT  to be sleeping throughout the night    Baseline PT wakes due to pain    Status Revised               Plan - 04/02/17 1344    Clinical Impression Statement Pt has noted atrophy in Lt gastrconemius mm is atrophied added Lt only heel raises to address this.  Therapist also noted pt does not completely straighten Lt LE when weightbearing therefore added terminal extension exercises.    Rehab Potential Good   PT Frequency 3x / week   PT Duration 6 weeks   PT Treatment/Interventions ADLs/Self Care Home Management;Cryotherapy;Electrical Stimulation;Gait training;Stair training;Functional mobility training;Therapeutic activities;Therapeutic exercise;Balance training;Patient/family education;Manual techniques   PT Next Visit Plan Pt to be reassessed next week with probable discharge.       Patient will benefit from skilled therapeutic intervention in order to improve the following deficits and impairments:  Abnormal gait, Decreased activity tolerance, Decreased balance, Decreased strength, Difficulty walking, Pain, Increased edema, Decreased range of motion  Visit Diagnosis: Stiffness of left knee  Acute pain of left knee  Muscle weakness (generalized)  Other abnormalities of gait and mobility     Problem List Patient Active Problem List   Diagnosis Date Noted  . Primary osteoarthritis of left knee   . Special screening for malignant neoplasms, colon    . Hemorrhoids 10/30/2015  . Fecal occult blood test positive 10/30/2015  . Postmenopausal 10/30/2015  . Large breasts 10/30/2015  . Bilateral shoulder pain 10/30/2015  . Back pain 10/30/2015  . Medial meniscus tear   . Lateral meniscus tear, current   . Erosive lichen planus of vulva 08/28/2014  . 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 Right hip flexor tightness 12/06/2013  . Plantar fasciitis 12/06/2013    Virgina Organ, PT CLT [phone] 04/02/2017, 1:47 PM  [facility] [address] [place], [place], [address] Phone: [phone]   Fax:  [phone]  Name: [name] MRN: [id number] Date of Birth: 03/01/1966

## 2017-04-07 ENCOUNTER — Ambulatory Visit (HOSPITAL_COMMUNITY): Payer: BLUE CROSS/BLUE SHIELD | Admitting: Physical Therapy

## 2017-04-07 DIAGNOSIS — M25662 Stiffness of left knee, not elsewhere classified: Secondary | ICD-10-CM | POA: Diagnosis not present

## 2017-04-07 DIAGNOSIS — M6281 Muscle weakness (generalized): Secondary | ICD-10-CM

## 2017-04-07 DIAGNOSIS — M25562 Pain in left knee: Secondary | ICD-10-CM | POA: Diagnosis not present

## 2017-04-07 DIAGNOSIS — R2689 Other abnormalities of gait and mobility: Secondary | ICD-10-CM

## 2017-04-07 NOTE — Therapy (Signed)
[facility] [address] [place], [place], [address] Phone: [phone]   Fax:  [phone]  Physical Therapy Treatment  Patient Details  Name: [name] MRN: [id number] Date of Birth: 11/04/1965 Referring Provider: Fuller CanadaStanley Harrison   Encounter Date: 04/07/2017      PT End of Session - 04/07/17 1337    Visit Number 17   Number of Visits 18   Date for PT Re-Evaluation 04/05/17   Authorization Type BCBS   Authorization - Visit Number 17   Authorization - Number of Visits 18   PT Start Time 1300   PT Stop Time 1340   PT Time Calculation (min) 40 min   Activity Tolerance Patient tolerated treatment well;No increased pain   Behavior During Therapy WFL for tasks assessed/performed      Past Medical History:  Diagnosis Date  . Arthritis   . Back pain 10/30/2015  . Bilateral shoulder pain 10/30/2015  . COPD (chronic obstructive pulmonary disease) (HCC)   . Fecal occult blood test positive 10/30/2015  . Hemorrhoids 10/30/2015  . Hypertension   . Hypothyroidism   . Large breasts 10/30/2015   44 DD  . Postmenopausal 10/30/2015  . Sleep apnea    pt DX but did not want to use CPAP.  Marland Kitchen. Thyroid disease     Past Surgical History:  Procedure Laterality Date  . COLONOSCOPY N/A 11/12/2015   Procedure: COLONOSCOPY;  Surgeon: West BaliSandi L Fields, MD;  [facility];  Service: Endoscopy;  Laterality: N/A;  8:30 AM  . KNEE ARTHROSCOPY WITH MEDIAL MENISECTOMY Left 05/04/2015   Procedure: KNEE ARTHROSCOPY WITH MEDIAL AND LATERAL MENISECTOMY;  Surgeon: Vickki HearingStanley E Harrison, MD;  Location: [facility];  Service: Orthopedics;  Laterality: Left;  . NASAL SINUS SURGERY    . TOTAL KNEE ARTHROPLASTY Left 01/29/2017   Procedure: TOTAL KNEE ARTHROPLASTY;  Surgeon: Vickki HearingHarrison, Stanley E, MD;  Location: [facility];  Service: Orthopedics;  Laterality: Left;    There were no vitals filed for this visit.      Subjective Assessment - 04/07/17 1302    Subjective Pt  statest that her knee is doing better    Pertinent History ; OA in right knee    How long can you sit comfortably? able to sit as long as she wants was 30 minutes    How long can you stand comfortably? 30 minutes or longer was less than five    How long can you walk comfortably? Walking without an assistive device for 30 minutes was using a cane and walking for 10  minutes    Patient Stated Goals sleep better, walk better, stand longer    Currently in Pain? No/denies   Pain Onset More than a month ago                         [facility] Treatment/Exercise - 04/07/17 0001      Knee/Hip Exercises: Stretches   LobbyistQuad Stretch 3 reps;30 seconds   Quad Stretch Limitations prone with rope   Knee: Self-Stretch to increase Flexion Left;10 seconds   Knee: Self-Stretch Limitations Knee drives on 12in step 10x 10" holds     Knee/Hip Exercises: Standing   Heel Raises Left;10 reps   Terminal Knee Extension Limitations 10   SLS with Vectors 3 x 10 "      Knee/Hip Exercises: Seated   Sit to Sand --  eccentric control cueing  Knee/Hip Exercises: Supine   Terminal Knee Extension Left;15 reps   Knee Flexion Limitations 115 degrees following manual     Knee/Hip Exercises: Prone   Hamstring Curl 10 reps   Hip Extension --   Hip Extension Limitations --   Contract/Relax to Increase Flexion x5     Manual Therapy   Manual Therapy Edema management;Myofascial release   Manual therapy comments completed seperately from all other skilled interventions   Edema Management retro to decrease edema   Myofascial Release to tight fascia perimeter of knee                  PT Short Term Goals - 03/20/17 1013      PT SHORT TERM GOAL #1   Title Pt left knee flexion to be to 95 degrees to allow pt to sit for 45 minutes in comfort to eat at a restaurant.    Baseline 06/21:  AROM Lt knee 106 degrees flexion; able to sit 1 hour comfortable   Status Achieved     PT SHORT TERM  GOAL #2   Title Pt to be walking inside without a cane.   Baseline 6/21: Ambulated without AD indoors, does walk with SPC with some outdoor for safety   Status Achieved     PT SHORT TERM GOAL #3   Title PT pain in her left knee to be no greater than a 5/10 to allow pt to be able to stand for 15 minutes at a time to be able to shower.    Baseline 6/21:  Able to stand 15-20 minutes, able to shower, average pain scale 5/10 function is achieved    Status Achieved     PT SHORT TERM GOAL #4   Title Pt to be able to walk for 20 minutes without increased left knee pain to be able to complete short shopping trips.    Status Achieved     PT SHORT TERM GOAL #5   Title Pt to be waking only one time a night    Baseline 6/29 wakes one time a night    Status Achieved           PT Long Term Goals - 03/20/17 1014      PT LONG TERM GOAL #1   Title Pt  left knee pain level to be no greater than a 2/10 to allow pt to be able to walk for an hour without difficulty.    Status On-going     PT LONG TERM GOAL #2   Title Pt to be walking without any assistive device both in and outside.    Baseline 6/29: walking without assistive device    Status Achieved     PT LONG TERM GOAL #3   Title Pt left knee extension to be no greater than a 2 to allow normalized gait.    Baseline 06/21 1-106 degrees   Status Achieved     PT LONG TERM GOAL #4   Title Pt left knee flexion to be at least 115 to be able to squat to pick items off the floor.    Status On-going     PT LONG TERM GOAL #5   Title Pt left LE strength to be increased by a grade to allow pt to come sit to stand without difficulty and to go up 4 steps in a reciprocal manner.    Baseline 06/29: strength allows sit to stand and steps limitation is pain    Status Achieved  PT LONG TERM GOAL #6   Title PT  to be sleeping throughout the night    Baseline PT wakes due to pain    Status Revised               Plan - 04/07/17 1337     Clinical Impression Statement Pt continues to have increased edema.  But ROM is continuing to improve with manual.  Added contract relax to gain flexion    Rehab Potential Good   PT Frequency 3x / week   PT Duration 6 weeks   PT Treatment/Interventions ADLs/Self Care Home Management;Cryotherapy;Electrical Stimulation;Gait training;Stair training;Functional mobility training;Therapeutic activities;Therapeutic exercise;Balance training;Patient/family education;Manual techniques   PT Next Visit Plan reassess next treatment with probable discharge.       Patient will benefit from skilled therapeutic intervention in order to improve the following deficits and impairments:  Abnormal gait, Decreased activity tolerance, Decreased balance, Decreased strength, Difficulty walking, Pain, Increased edema, Decreased range of motion  Visit Diagnosis: Stiffness of left knee  Acute pain of left knee  Muscle weakness (generalized)  Other abnormalities of gait and mobility     Problem List Patient Active Problem List   Diagnosis Date Noted  . Primary osteoarthritis of left knee   . Special screening for malignant neoplasms, colon   . Hemorrhoids 10/30/2015  . Fecal occult blood test positive 10/30/2015  . Postmenopausal 10/30/2015  . Large breasts 10/30/2015  . Bilateral shoulder pain 10/30/2015  . Back pain 10/30/2015  . Medial meniscus tear   . Lateral meniscus tear, current   . Erosive lichen planus of vulva 08/28/2014  . 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 Right hip flexor tightness 12/06/2013  . Plantar fasciitis 12/06/2013   Virgina Organ, PT CLT [phone] 04/07/2017, 1:40 PM  [employer] [facility] [address] [place], [place], [address] Phone: [phone]   Fax:  [phone]  Name: [name] MRN: [id number] Date of Birth: 02-26-1966

## 2017-04-09 ENCOUNTER — Ambulatory Visit (HOSPITAL_COMMUNITY): Payer: BLUE CROSS/BLUE SHIELD | Admitting: Physical Therapy

## 2017-04-09 DIAGNOSIS — R2689 Other abnormalities of gait and mobility: Secondary | ICD-10-CM | POA: Diagnosis not present

## 2017-04-09 DIAGNOSIS — M6281 Muscle weakness (generalized): Secondary | ICD-10-CM | POA: Diagnosis not present

## 2017-04-09 DIAGNOSIS — M25562 Pain in left knee: Secondary | ICD-10-CM | POA: Diagnosis not present

## 2017-04-09 DIAGNOSIS — M25662 Stiffness of left knee, not elsewhere classified: Secondary | ICD-10-CM

## 2017-04-09 NOTE — Therapy (Signed)
[place], [place], [address] Phone: [phone]   Fax:  [phone]  Physical Therapy Treatment/Discharge  Patient Details  Name: [name] MRN: [id number] Date of Birth: [birth year] Referring Provider: Arther Abbott   Encounter Date: 04/09/2017      PT End of Session - 04/09/17 1540    Visit Number 18   Number of Visits 18   Date for PT Re-Evaluation 04/05/17   Authorization Type [employer]   Authorization - Visit Number 18   Authorization - Number of Visits 18   PT Start Time 6948   PT Stop Time 1545   PT Time Calculation (min) 27 min   Activity Tolerance Patient tolerated treatment well;No increased pain   Behavior During Therapy WFL for tasks assessed/performed      Past Medical History:  Diagnosis Date  . Arthritis   . Back pain 10/30/2015  . Bilateral shoulder pain 10/30/2015  . COPD (chronic obstructive pulmonary disease) ([facility])   . Fecal occult blood test positive 10/30/2015  . Hemorrhoids 10/30/2015  . Hypertension   . Hypothyroidism   . Large breasts 10/30/2015   44 DD  . Postmenopausal 10/30/2015  . Sleep apnea    pt DX but did not want to use CPAP.  Marland Kitchen Thyroid disease     Past Surgical History:  Procedure Laterality Date  . COLONOSCOPY N/A 11/12/2015   Procedure: COLONOSCOPY;  Surgeon: Danie Binder, MD;  Location: [facility];  Service: Endoscopy;  Laterality: N/A;  8:30 AM  . KNEE ARTHROSCOPY WITH MEDIAL MENISECTOMY Left 05/04/2015   Procedure: KNEE ARTHROSCOPY WITH MEDIAL AND LATERAL MENISECTOMY;  Surgeon: Carole Civil, MD;  Location: [facility];  Service: Orthopedics;  Laterality: Left;  . NASAL SINUS SURGERY    . TOTAL KNEE ARTHROPLASTY Left 01/29/2017   Procedure: TOTAL KNEE ARTHROPLASTY;  Surgeon: Carole Civil, MD;  Location: [facility];  Service: Orthopedics;  Laterality: Left;    There were no vitals filed for this visit.      Subjective Assessment - 04/09/17 1520    Subjective  Pt statest that her knee is doing better    Pertinent History ; OA in right knee    How long can you sit comfortably? able to sit as long as she wants was 30 minutes    How long can you stand comfortably? 30 minutes or longer was less than five    How long can you walk comfortably? Walking without an assistive device for 30 minutes was using a cane and walking for 10  minutes    Patient Stated Goals sleep better, walk better, stand longer    Currently in Pain? No/denies   Pain Onset More than a month ago            [facility] PT Assessment - 04/09/17 0001      Assessment   Medical Diagnosis Lt TKR   Referring Provider Arther Abbott    Onset Date/Surgical Date 01/28/17   Next MD Visit 04/17/2017   Prior Therapy acute and home health      Precautions   Precautions None     Restrictions   Weight Bearing Restrictions No     Balance Screen   Has the patient fallen in the past 6 months No   Has the patient had a decrease in activity level because of a fear of falling?  No   Is the patient reluctant to leave their home because of a  fear of falling?  No     Home Environment   Type of Home Mobile home   Home Access Stairs to enter   Entrance Stairs-Number of Steps 4   Entrance Stairs-Rails Right;Left     Prior Function   Level of Independence Independent   Vocation Full time employment   Vocation Requirements CNA- 8 hours, lots of walking and lifting      Cognition   Overall Cognitive Status Within Functional Limits for tasks assessed     Observation/Other Assessments   Focus on Therapeutic Outcomes (FOTO)  42     Observation/Other Assessments-Edema    Edema Circumferential     Circumferential Edema   Circumferential - Right 44.5   Circumferential - Left  47     Functional Tests   Functional tests Single leg stance;Sit to Stand     Single Leg Stance   Comments Rt 32 seconds was 32; : Lt 60 was 56 seconds on 6/29      Sit to Stand   Comments 5 x 18.82 was 30.83 on  6/29      AROM   Left Knee Extension 2  was 5    Left Knee Flexion 110  was 74      Strength   Left Hip Flexion 5/5  was 4-/5    Left Hip Extension 4-/5  was 3+/5    Left Hip ABduction 5/5  was 3+/5   Right Knee Extension 5/5   Left Knee Flexion 5/5  was 3-/5    Left Knee Extension 5/5  was 3/5    Right Ankle Dorsiflexion 5/5   Left Ankle Dorsiflexion 5/5                     [facility] Adult PT Treatment/Exercise - 04/09/17 0001      Knee/Hip Exercises: Supine   Quad Sets Left;10 reps   Heel Slides 5 reps     Knee/Hip Exercises: Prone   Hip Extension Both;15 reps                  PT Short Term Goals - 04/09/17 1535      PT SHORT TERM GOAL #1   Title Pt left knee flexion to be to 95 degrees to allow pt to sit for 45 minutes in comfort to eat at a restaurant.    Baseline 06/21:  AROM Lt knee 106 degrees flexion; able to sit 1 hour comfortable   Status Achieved     PT SHORT TERM GOAL #2   Title Pt to be walking inside without a cane.   Baseline 6/21: Ambulated without AD indoors, does walk with SPC with some outdoor for safety   Status Achieved     PT SHORT TERM GOAL #3   Title PT pain in her left knee to be no greater than a 5/10 to allow pt to be able to stand for 15 minutes at a time to be able to shower.    Baseline 6/21:  Able to stand 15-20 minutes, able to shower, average pain scale 5/10 function is achieved    Status Achieved     PT SHORT TERM GOAL #4   Title Pt to be able to walk for 20 minutes without increased left knee pain to be able to complete short shopping trips.    Status Achieved     PT SHORT TERM GOAL #5   Title Pt to be waking only one time a night  Status Achieved           PT Long Term Goals - 04/09/17 1536      PT LONG TERM GOAL #1   Title Pt  left knee pain level to be no greater than a 2/10 to allow pt to be able to walk for an hour without difficulty.    Status On-going     PT LONG TERM GOAL #2   Title  Pt to be walking without any assistive device both in and outside.    Baseline 6/29: walking without assistive device    Status Achieved     PT LONG TERM GOAL #3   Title Pt left knee extension to be no greater than a 2 to allow normalized gait.    Baseline 06/21 1-106 degrees   Status Achieved     PT LONG TERM GOAL #4   Title Pt left knee flexion to be at least 115 to be able to squat to pick items off the floor.    Status Partially Met  Some days pt is able to obtain 115.      PT LONG TERM GOAL #5   Title Pt left LE strength to be increased by a grade to allow pt to come sit to stand without difficulty and to go up 4 steps in a reciprocal manner.    Baseline 06/29: strength allows sit to stand and steps limitation is pain    Status Achieved     PT LONG TERM GOAL #6   Title PT  to be sleeping throughout the night    Baseline PT wakes due to pain    Status Revised               Plan - 04/09/17 1547    Clinical Impression Statement Pt reassessed with noted improvement except for edema.  Pt has met or nearly met all goals. Pt is no longer in need of skilled physical therapy.   Rehab Potential Good   PT Frequency 3x / week   PT Duration 6 weeks   PT Treatment/Interventions ADLs/Self Care Home Management;Cryotherapy;Electrical Stimulation;Gait training;Stair training;Functional mobility training;Therapeutic activities;Therapeutic exercise;Balance training;Patient/family education;Manual techniques   PT Next Visit Plan Discharge pt.      Patient will benefit from skilled therapeutic intervention in order to improve the following deficits and impairments:  Abnormal gait, Decreased activity tolerance, Decreased balance, Decreased strength, Difficulty walking, Pain, Increased edema, Decreased range of motion  Visit Diagnosis: Stiffness of left knee  Acute pain of left knee  Muscle weakness (generalized)     Problem List Patient Active Problem List   Diagnosis Date  Noted  . Primary osteoarthritis of left knee   . Special screening for malignant neoplasms, colon   . Hemorrhoids 10/30/2015  . Fecal occult blood test positive 10/30/2015  . Postmenopausal 10/30/2015  . Large breasts 10/30/2015  . Bilateral shoulder pain 10/30/2015  . Back pain 10/30/2015  . Medial meniscus tear   . Lateral meniscus tear, current   . Erosive lichen planus of vulva 08/28/2014  . 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 Right hip flexor tightness 12/06/2013  . Plantar fasciitis 12/06/2013   Rayetta Humphrey, PT CLT [phone] 04/09/2017, 3:55 PM  [place] [address] [place], [place], [address] Phone: [phone]   Fax:  [phone]  Name: [name] MRN: [id number] Date of Birth: Jul 24, 1966  PHYSICAL THERAPY DISCHARGE SUMMARY  Visits from Start of Care: 18  Current functional level related to  goals / functional outcomes: See above   Remaining deficits: See above   Education / Equipment: Importance of walking  Plan: Patient agrees to discharge.  Patient goals were met. Patient is being discharged due to being pleased with the current functional level.  ?????        Rayetta Humphrey, [place] CLT [phone]

## 2017-04-17 ENCOUNTER — Encounter: Payer: Self-pay | Admitting: Orthopedic Surgery

## 2017-04-17 ENCOUNTER — Ambulatory Visit (INDEPENDENT_AMBULATORY_CARE_PROVIDER_SITE_OTHER): Payer: BLUE CROSS/BLUE SHIELD | Admitting: Orthopedic Surgery

## 2017-04-17 DIAGNOSIS — Z96652 Presence of left artificial knee joint: Secondary | ICD-10-CM

## 2017-04-17 DIAGNOSIS — Z4889 Encounter for other specified surgical aftercare: Secondary | ICD-10-CM

## 2017-04-17 MED ORDER — HYDROCODONE-ACETAMINOPHEN 5-325 MG PO TABS: 1.0000 | ORAL_TABLET | Freq: Three times a day (TID) | ORAL | 0 refills | Status: DC | PRN

## 2017-04-17 NOTE — Progress Notes (Signed)
Postop visit  Patient had a left total knee comes in for routine follow-up  Major complaint today is soreness in the left knee and swelling of the left leg which is intermittent  She had surgery on 01/29/2017  She is a good go back to work  She is a stable knee which is full extension flexion is 110  We refilled her pain medication with appropriate opioid precautions follow-up for one year x-ray in May  Encounter Diagnoses  Name Primary?  Marland Kitchen. Aftercare following surgery Yes  . History of left knee replacement    Current Meds  Medication Sig  . aspirin EC 325 MG EC tablet Take 1 tablet (325 mg total) by mouth daily with breakfast.  . cyanocobalamin 500 MCG tablet Take 500 mcg by mouth daily.  . hydrochlorothiazide (HYDRODIURIL) 25 MG tablet Take 25 mg by mouth daily.  Marland Kitchen. HYDROcodone-acetaminophen (NORCO/VICODIN) 5-325 MG tablet Take 1 tablet by mouth every 8 (eight) hours as needed for moderate pain.  Marland Kitchen. levothyroxine (SYNTHROID, LEVOTHROID) 25 MCG tablet Take 25 mcg by mouth daily before breakfast.  . Potassium Bicarb & Chloride (POT BICARB-POT CHLORIDE PO) Take 250 mg by mouth.  . pravastatin (PRAVACHOL) 20 MG tablet Take 20 mg by mouth every evening.   . TURMERIC PO Take 1 tablet by mouth daily.

## 2017-04-24 DIAGNOSIS — M722 Plantar fascial fibromatosis: Secondary | ICD-10-CM | POA: Diagnosis not present

## 2017-04-24 DIAGNOSIS — Z6841 Body Mass Index (BMI) 40.0 and over, adult: Secondary | ICD-10-CM | POA: Diagnosis not present

## 2017-04-24 DIAGNOSIS — Z1389 Encounter for screening for other disorder: Secondary | ICD-10-CM | POA: Diagnosis not present

## 2017-04-24 DIAGNOSIS — E063 Autoimmune thyroiditis: Secondary | ICD-10-CM | POA: Diagnosis not present

## 2017-05-12 DIAGNOSIS — Z6841 Body Mass Index (BMI) 40.0 and over, adult: Secondary | ICD-10-CM | POA: Diagnosis not present

## 2017-05-12 DIAGNOSIS — E063 Autoimmune thyroiditis: Secondary | ICD-10-CM | POA: Diagnosis not present

## 2017-05-12 DIAGNOSIS — M722 Plantar fascial fibromatosis: Secondary | ICD-10-CM | POA: Diagnosis not present

## 2017-05-12 DIAGNOSIS — R252 Cramp and spasm: Secondary | ICD-10-CM | POA: Diagnosis not present

## 2017-05-12 DIAGNOSIS — Z1389 Encounter for screening for other disorder: Secondary | ICD-10-CM | POA: Diagnosis not present

## 2017-05-19 ENCOUNTER — Telehealth: Payer: Self-pay | Admitting: Orthopedic Surgery

## 2017-05-19 MED ORDER — HYDROCODONE-ACETAMINOPHEN 5-325 MG PO TABS: 1.0000 | ORAL_TABLET | Freq: Two times a day (BID) | ORAL | 0 refills | Status: DC | PRN

## 2017-05-19 NOTE — Telephone Encounter (Signed)
Routing to dr harrison 

## 2017-05-19 NOTE — Telephone Encounter (Signed)
Patient requests refill on Hydrocodone/Acetaminophen 5-325 mgs.   Qty  30   Sig: Take 1 tablet by mouth every 8 (eight) hours as needed for moderate pain.

## 2017-07-17 DIAGNOSIS — M722 Plantar fascial fibromatosis: Secondary | ICD-10-CM | POA: Diagnosis not present

## 2017-07-17 DIAGNOSIS — M79672 Pain in left foot: Secondary | ICD-10-CM | POA: Diagnosis not present

## 2017-08-21 DIAGNOSIS — M722 Plantar fascial fibromatosis: Secondary | ICD-10-CM | POA: Diagnosis not present

## 2017-08-21 DIAGNOSIS — M79672 Pain in left foot: Secondary | ICD-10-CM | POA: Diagnosis not present

## 2017-09-11 DIAGNOSIS — M2550 Pain in unspecified joint: Secondary | ICD-10-CM | POA: Diagnosis not present

## 2017-09-11 DIAGNOSIS — R000 Tachycardia, unspecified: Secondary | ICD-10-CM | POA: Diagnosis not present

## 2017-09-11 DIAGNOSIS — Z6839 Body mass index (BMI) 39.0-39.9, adult: Secondary | ICD-10-CM | POA: Diagnosis not present

## 2017-09-11 DIAGNOSIS — Z0001 Encounter for general adult medical examination with abnormal findings: Secondary | ICD-10-CM | POA: Diagnosis not present

## 2017-09-11 DIAGNOSIS — G4730 Sleep apnea, unspecified: Secondary | ICD-10-CM | POA: Diagnosis not present

## 2017-09-11 DIAGNOSIS — Z1389 Encounter for screening for other disorder: Secondary | ICD-10-CM | POA: Diagnosis not present

## 2017-09-21 DIAGNOSIS — R0602 Shortness of breath: Secondary | ICD-10-CM | POA: Diagnosis not present

## 2017-09-21 DIAGNOSIS — J449 Chronic obstructive pulmonary disease, unspecified: Secondary | ICD-10-CM | POA: Diagnosis not present

## 2017-09-21 DIAGNOSIS — R062 Wheezing: Secondary | ICD-10-CM | POA: Diagnosis not present

## 2017-09-21 DIAGNOSIS — Z6839 Body mass index (BMI) 39.0-39.9, adult: Secondary | ICD-10-CM | POA: Diagnosis not present

## 2017-09-21 DIAGNOSIS — R05 Cough: Secondary | ICD-10-CM | POA: Diagnosis not present

## 2017-09-25 DIAGNOSIS — Z719 Counseling, unspecified: Secondary | ICD-10-CM | POA: Diagnosis not present

## 2017-09-25 DIAGNOSIS — G4730 Sleep apnea, unspecified: Secondary | ICD-10-CM | POA: Diagnosis not present

## 2017-10-01 ENCOUNTER — Other Ambulatory Visit: Payer: Self-pay | Admitting: Family Medicine

## 2017-10-01 DIAGNOSIS — Z1231 Encounter for screening mammogram for malignant neoplasm of breast: Secondary | ICD-10-CM

## 2017-10-09 NOTE — Progress Notes (Signed)
Office Visit Note  Patient: [name]             Date of Birth: 1966-04-12           MRN: [id number]             PCP: Sharilyn Sites, MD Referring: Sharilyn Sites, MD Visit Date: 10/14/2017 Occupation: _0 @    Subjective:   Pain and right knee and shoulders  History of Present Illness: [name] is a 52 y.o. female seen in consultation per request of her PCP. According to patient her symptoms a started with left knee joint pain and 2015. In 2018 she underwent left total knee replacement by Dr. Aline Brochure. She states she still has some discomfort and swelling in her left knee. For the last 1 year she's been having pain and swelling in her right knee joint. She also gives history of discomfort in her bilateral shoulders bilateral hands bilateral hips and bilateral ankles. She has had recurrent plantar fasciitis for the last 5 years. She states she has had 3 cortisone injections per year for plantar fasciitis. In December 2018 she had to wear a boot on her left foot for plantar fasciitis.  Activities of Daily Living:  Patient reports morning stiffness for 5-10 minutes.   Patient Denies nocturnal pain.  Difficulty dressing/grooming: Denies Difficulty climbing stairs: Reports Difficulty getting out of chair: Reports Difficulty using hands for taps, buttons, cutlery, and/or writing: Denies   Review of Systems  Constitutional: Positive for fatigue. Negative for weakness.  HENT: Positive for mouth sores. Negative for mouth dryness and nose dryness.   Eyes: Negative for redness and dryness.  Respiratory: Positive for shortness of breath. Negative for cough, hemoptysis and difficulty breathing.   Cardiovascular: Positive for hypertension. Negative for chest pain, palpitations, irregular heartbeat and swelling in legs/feet.  Gastrointestinal: Negative for blood in stool, constipation and diarrhea.  Endocrine: Negative for increased urination.  Genitourinary: Negative for painful  urination.  Musculoskeletal: Positive for arthralgias, joint pain, joint swelling, myalgias, morning stiffness and myalgias. Negative for muscle weakness and muscle tenderness.  Skin: Positive for rash. Negative for color change, pallor, hair loss, nodules/bumps, redness, skin tightness, ulcers and sensitivity to sunlight.  Neurological: Negative for dizziness, numbness and headaches.  Hematological: Negative for swollen glands.  Psychiatric/Behavioral: Positive for sleep disturbance. Negative for depressed mood. The patient is not nervous/anxious.     PMFS History:  Patient Active Problem List   Diagnosis Date Noted  . Status post total knee replacement, left 10/14/2017  . Special screening for malignant neoplasms, colon   . Hemorrhoids 10/30/2015  . Fecal occult blood test positive 10/30/2015  . Postmenopausal 10/30/2015  . Large breasts 10/30/2015  . Bilateral shoulder pain 10/30/2015  . Back pain 10/30/2015  . Medial meniscus tear   . Lateral meniscus tear, current   . Erosive lichen planus of vulva 08/28/2014  . 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 Right hip flexor tightness 12/06/2013  . Plantar fasciitis 12/06/2013    Past Medical History:  Diagnosis Date  . Arthritis   . Back pain 10/30/2015  . Bilateral shoulder pain 10/30/2015  . COPD (chronic obstructive pulmonary disease) ([place])   . Fecal occult blood test positive 10/30/2015  . Hemorrhoids 10/30/2015  . Hypertension   . Hypothyroidism   . Large breasts 10/30/2015   44 DD  . Postmenopausal 10/30/2015  . Sleep apnea    pt DX but did not want to use CPAP.  Marland Kitchen Thyroid disease  Family History  Problem Relation Age of Onset  . Alzheimer's disease Mother   . Cancer Father        brain tumor  . Diabetes Brother   . Heart disease Brother   . Heart attack Brother   . Cancer Brother        melanoma  . Lupus Maternal Grandmother   . Heart disease Maternal Grandmother   . Heart disease Maternal Grandfather   . Hypertension  Maternal Grandfather    Past Surgical History:  Procedure Laterality Date  . COLONOSCOPY N/A 11/12/2015   Procedure: COLONOSCOPY;  Surgeon: Danie Binder, MD;  [facility];  Service: Endoscopy;  Laterality: N/A;  8:30 AM  . JOINT REPLACEMENT    . KNEE ARTHROSCOPY WITH MEDIAL MENISECTOMY Left 05/04/2015   Procedure: KNEE ARTHROSCOPY WITH MEDIAL AND LATERAL MENISECTOMY;  Surgeon: Carole Civil, MD;  Location: [facility];  Service: Orthopedics;  Laterality: Left;  . NASAL SINUS SURGERY    . TOTAL KNEE ARTHROPLASTY Left 01/29/2017   Procedure: TOTAL KNEE ARTHROPLASTY;  Surgeon: Carole Civil, MD;  Location: [facility];  Service: Orthopedics;  Laterality: Left;   Social History   Social History Narrative  . Not on file     Objective: Vital Signs: BP 130/88 (BP Location: Left Arm, Patient Position: Sitting, Cuff Size: Normal)   Pulse 72   Resp 17   Ht _0  (1.6 m)   Wt 226 lb (102.5 kg)   LMP 04/14/2011   BMI 40.03 kg/m    Physical Exam   Musculoskeletal Exam: C-spine good range of motion. Thoracic and lumbar spine limited range of motion without discomfort. She has painful range of motion of bilateral shoulders. Elbow joints and wrist joints are good range of motion. She has DIP PIP thickening in her hands consistent with osteoarthritis. No synovitis was noted. She is good range of motion of her hip joints with some tenderness over bilateral trochanteric area. Her left total knee replacement appears to be doing well with some warmth. She had painful range of motion of right knee joint without any warmth swelling or effusion. She is no swelling over the ankle joints. She has some DIP PIP thickening in her feet consistent with osteoarthritis no synovitis was noted. She had tenderness over bilateral plantar fascia consistent with plantar fasciitis.  CDAI Exam: No CDAI exam completed.    Investigation: No additional findings. CMP Latest Ref Rng & Units 01/30/2017 01/27/2017  09/19/2016  Glucose 65 - 99 mg/dL 128(H) 130(H) 117(H)  BUN 6 - 20 mg/dL _1 Creatinine 0.44 - 1.00 mg/dL 0.54 0.69 0.61  Sodium 135 - 145 mmol/L 139 138 134(L)  Potassium 3.5 - 5.1 mmol/L 4.0 3.7 3.2(L)  Chloride 101 - 111 mmol/L 104 98(L) 99(L)  CO2 22 - 32 mmol/L _2 Calcium 8.9 - 10.3 mg/dL 8.2(L) 9.3 8.3(L)  Total Protein 6.5 - 8.1 g/dL - - 6.6  Total Bilirubin 0.3 - 1.2 mg/dL - - 0.7  Alkaline Phos 38 - 126 U/L - - 103  AST 15 - 41 U/L - - 23  ALT 14 - 54 U/L - - 20   CBC Latest Ref Rng & Units 01/31/2017 01/30/2017 01/27/2017  WBC 4.0 - 10.5 K/uL 15.7(H) 11.1(H) 9.9  Hemoglobin 12.0 - 15.0 g/dL 12.5 12.3 13.8  Hematocrit 36.0 - 46.0 % 38.1 37.0 41.2  Platelets 150 - 400 K/uL 187 169 207    Imaging: Xr Foot 2 Views  Left  Result Date: 10/14/2017 PIP/DIP narrowing was noted. No erosive changes were noted. No intertarsal joint space narrowing was noted. Calcaneal spur was noted. Impression: These findings are consistent with osteoarthritis of the foot.  Xr Foot 2 Views Right  Result Date: 10/14/2017 PIP/DIP narrowing was noted. No erosive changes were noted. No intertarsal joint space narrowing was noted. Calcaneal spur was noted. Impression: These findings are consistent with osteoarthritis of the foot.  Xr Hand 2 View Left  Result Date: 10/14/2017 Severe PIP/DIP narrowing was noted without any erosive changes. No MCP narrowing or intercarpal or radiocarpal joint space narrowing was noted. Impression: These findings are consistent with osteoarthritis of the hand.  Xr Hand 2 View Right  Result Date: 10/14/2017 Severe PIP/DIP narrowing was noted without any erosive changes. No MCP narrowing or intercarpal or radiocarpal joint space narrowing was noted. Impression: These findings are consistent with osteoarthritis of the hand.  Xr Knee 3 View Right  Result Date: 10/14/2017 Moderate medial compartment narrowing intercondylar osteophytes and lateral osteophytes was  noted. No chondrocalcinosis was noted. Severe patellofemoral narrowing was noted. Impression: These findings are consistent with moderate osteoarthritis and severe chondromalacia patella.  Xr Shoulder Left  Result Date: 10/14/2017 No glenohumeral joint space narrowing was noted. No chondrocalcinosis was noted. No acromioclavicular joint space narrowing was noted. Impression: Unremarkable x-ray of the left shoulder.  Xr Shoulder Right  Result Date: 10/14/2017 No glenohumeral joint space narrowing was noted. No chondrocalcinosis was noted. No acromioclavicular joint space narrowing was noted. Impression: Unremarkable x-ray of the right shoulder.   Speciality Comments: No specialty comments available.    Procedures:  No procedures performed Allergies: Penicillins   Assessment / Plan:     Visit Diagnoses: Chronic pain of both shoulders -patient complains of pain and discomfort in her shoulders for long time. She fairly good range of motion with minimal discomfort. Plan: XR Shoulder Right, XR Shoulder Left  Pain in both hands -her clinical findings are consistent with osteoarthritis. I did not appreciate any synovitis. Plan: XR Hand 2 View Right, XR Hand 2 View Left  Chronic pain of right knee - Plan: XR KNEE 3 VIEW RIGHT  Status post total left knee replacement - 2018 Dr. Aline Brochure. She still have pain and discomfort in her left knee.  Pain in both feet - Plan: XR Foot 2 Views Right, XR Foot 2 Views Left  Plantar fasciitis: She has had recurrent problems with plantar fasciitis for many years. She is a cortisone injections without much relief. She was also in a boot for her left foot in December.  Other medical problems are listed as follows:  Hypothyroidism, unspecified type  History of hypercholesterolemia  History of sleep apnea  Erosive lichen planus of vulva    Orders: Orders Placed This Encounter  Procedures  . XR Shoulder Right  . XR Shoulder Left  . XR Hand 2 View  Right  . XR Hand 2 View Left  . XR KNEE 3 VIEW RIGHT  . XR Foot 2 Views Right  . XR Foot 2 Views Left  . CBC with Differential/Platelet  . COMPLETE METABOLIC PANEL WITH GFR  . Uric acid  . Rheumatoid factor  . Angiotensin converting enzyme  . HLA-B27 antigen  . Sedimentation rate   No orders of the defined types were placed in this encounter.   Face-to-face time spent with patient was 50 minutes. Greater than 50% of time was spent in counseling and coordination of care.  Follow-Up Instructions: Return for Osteoarthritis.  Bo Merino, MD  Note - This record has been created using Editor, commissioning.  Chart creation errors have been sought, but may not always  have been located. Such creation errors do not reflect on  the standard of medical care.

## 2017-10-14 ENCOUNTER — Ambulatory Visit (INDEPENDENT_AMBULATORY_CARE_PROVIDER_SITE_OTHER): Payer: Self-pay

## 2017-10-14 ENCOUNTER — Ambulatory Visit: Payer: BLUE CROSS/BLUE SHIELD | Admitting: Rheumatology

## 2017-10-14 ENCOUNTER — Encounter: Payer: Self-pay | Admitting: Rheumatology

## 2017-10-14 VITALS — BP 130/88 | HR 72 | Resp 17 | Ht 63.0 in | Wt 226.0 lb

## 2017-10-14 DIAGNOSIS — M25512 Pain in left shoulder: Secondary | ICD-10-CM | POA: Diagnosis not present

## 2017-10-14 DIAGNOSIS — M79671 Pain in right foot: Secondary | ICD-10-CM

## 2017-10-14 DIAGNOSIS — M79642 Pain in left hand: Secondary | ICD-10-CM

## 2017-10-14 DIAGNOSIS — E039 Hypothyroidism, unspecified: Secondary | ICD-10-CM

## 2017-10-14 DIAGNOSIS — M25511 Pain in right shoulder: Secondary | ICD-10-CM | POA: Diagnosis not present

## 2017-10-14 DIAGNOSIS — Z96652 Presence of left artificial knee joint: Secondary | ICD-10-CM | POA: Diagnosis not present

## 2017-10-14 DIAGNOSIS — E66813 Obesity, class 3: Secondary | ICD-10-CM

## 2017-10-14 DIAGNOSIS — M722 Plantar fascial fibromatosis: Secondary | ICD-10-CM

## 2017-10-14 DIAGNOSIS — M79672 Pain in left foot: Secondary | ICD-10-CM

## 2017-10-14 DIAGNOSIS — G8929 Other chronic pain: Secondary | ICD-10-CM

## 2017-10-14 DIAGNOSIS — R5383 Other fatigue: Secondary | ICD-10-CM | POA: Diagnosis not present

## 2017-10-14 DIAGNOSIS — L438 Other lichen planus: Secondary | ICD-10-CM | POA: Diagnosis not present

## 2017-10-14 DIAGNOSIS — M25561 Pain in right knee: Secondary | ICD-10-CM | POA: Diagnosis not present

## 2017-10-14 DIAGNOSIS — Z8669 Personal history of other diseases of the nervous system and sense organs: Secondary | ICD-10-CM

## 2017-10-14 DIAGNOSIS — Z8639 Personal history of other endocrine, nutritional and metabolic disease: Secondary | ICD-10-CM | POA: Diagnosis not present

## 2017-10-14 DIAGNOSIS — Z6841 Body Mass Index (BMI) 40.0 and over, adult: Secondary | ICD-10-CM

## 2017-10-14 DIAGNOSIS — M79641 Pain in right hand: Secondary | ICD-10-CM

## 2017-10-18 LAB — CBC WITH DIFFERENTIAL/PLATELET
BASOS PCT: 0.5 %
Basophils Absolute: 43 cells/uL (ref 0–200)
EOS PCT: 1.5 %
Eosinophils Absolute: 129 cells/uL (ref 15–500)
HCT: 41.8 % (ref 35.0–45.0)
Hemoglobin: 13.9 g/dL (ref 11.7–15.5)
Lymphs Abs: 1694 cells/uL (ref 850–3900)
MCH: 27.9 pg (ref 27.0–33.0)
MCHC: 33.3 g/dL (ref 32.0–36.0)
MCV: 83.8 fL (ref 80.0–100.0)
MONOS PCT: 8.5 %
MPV: 12.4 fL (ref 7.5–12.5)
Neutro Abs: 6003 cells/uL (ref 1500–7800)
Neutrophils Relative %: 69.8 %
Platelets: 222 10*3/uL (ref 140–400)
RBC: 4.99 10*6/uL (ref 3.80–5.10)
RDW: 13.5 % (ref 11.0–15.0)
Total Lymphocyte: 19.7 %
WBC mixed population: 731 cells/uL (ref 200–950)
WBC: 8.6 10*3/uL (ref 3.8–10.8)

## 2017-10-18 LAB — COMPLETE METABOLIC PANEL WITH GFR
AG RATIO: 1.4 (calc) (ref 1.0–2.5)
ALT: 22 U/L (ref 6–29)
AST: 19 U/L (ref 10–35)
Albumin: 4.1 g/dL (ref 3.6–5.1)
Alkaline phosphatase (APISO): 101 U/L (ref 33–130)
BILIRUBIN TOTAL: 0.4 mg/dL (ref 0.2–1.2)
BUN: 17 mg/dL (ref 7–25)
CHLORIDE: 101 mmol/L (ref 98–110)
CO2: 34 mmol/L — ABNORMAL HIGH (ref 20–32)
Calcium: 9.4 mg/dL (ref 8.6–10.4)
Creat: 0.68 mg/dL (ref 0.50–1.05)
GFR, EST AFRICAN AMERICAN: 117 mL/min/{1.73_m2} (ref >=60)
GFR, Est Non African American: 101 mL/min/{1.73_m2} (ref >=60)
Globulin: 3 g/dL (calc) (ref 1.9–3.7)
Glucose, Bld: 96 mg/dL (ref 65–99)
POTASSIUM: 4.1 mmol/L (ref 3.5–5.3)
Sodium: 140 mmol/L (ref 135–146)
TOTAL PROTEIN: 7.1 g/dL (ref 6.1–8.1)

## 2017-10-18 LAB — ANGIOTENSIN CONVERTING ENZYME: Angiotensin-Converting Enzyme: 13 U/L (ref 9–67)

## 2017-10-18 LAB — RHEUMATOID FACTOR: Rhuematoid fact SerPl-aCnc: 17 IU/mL — ABNORMAL HIGH (ref <14)

## 2017-10-18 LAB — URIC ACID: Uric Acid, Serum: 5.7 mg/dL (ref 2.5–7.0)

## 2017-10-18 LAB — HLA-B27 ANTIGEN: HLA-B27 Antigen: NEGATIVE

## 2017-10-18 LAB — SEDIMENTATION RATE: SED RATE: 14 mm/h (ref 0–30)

## 2017-10-19 NOTE — Progress Notes (Signed)
We'll discuss at follow-up visit

## 2017-10-25 ENCOUNTER — Emergency Department (HOSPITAL_COMMUNITY): Payer: BLUE CROSS/BLUE SHIELD

## 2017-10-25 ENCOUNTER — Other Ambulatory Visit: Payer: Self-pay

## 2017-10-25 ENCOUNTER — Encounter (HOSPITAL_COMMUNITY): Payer: Self-pay | Admitting: Emergency Medicine

## 2017-10-25 DIAGNOSIS — R1110 Vomiting, unspecified: Secondary | ICD-10-CM | POA: Diagnosis not present

## 2017-10-25 DIAGNOSIS — Z5321 Procedure and treatment not carried out due to patient leaving prior to being seen by health care provider: Secondary | ICD-10-CM | POA: Insufficient documentation

## 2017-10-25 MED ORDER — ALBUTEROL SULFATE (2.5 MG/3ML) 0.083% IN NEBU: 5.0000 mg | INHALATION_SOLUTION | Freq: Once | RESPIRATORY_TRACT | Status: DC

## 2017-10-25 NOTE — ED Triage Notes (Signed)
Patient reports cough that started yesterday, emesis today. Patient also c/o R rib pain and back pain.

## 2017-10-25 NOTE — Progress Notes (Addendum)
Patient in lobby, neb treatment ordered by nurse. 23:30.

## 2017-10-26 ENCOUNTER — Emergency Department (HOSPITAL_COMMUNITY)
Admission: EM | Admit: 2017-10-26 | Discharge: 2017-10-26 | Disposition: A | Discharge status: Home / Self Care | Payer: BLUE CROSS/BLUE SHIELD | Attending: Emergency Medicine | Admitting: Emergency Medicine

## 2017-10-26 NOTE — ED Notes (Signed)
Called x 1 no answer

## 2017-10-26 NOTE — ED Provider Notes (Signed)
EKG Interpretation  Date/Time:  Sunday October 25 2017 23:15:08 EST Ventricular Rate:  93 PR Interval:  160 QRS Duration: 84 QT Interval:  396 QTC Calculation: 492 R Axis:   29 Text Interpretation:  Normal sinus rhythm Prolonged QT Since last tracing rate faster 19 Sep 2016 Confirmed by Devoria AlbeKnapp, Ginny Loomer ([id number]) on 10/25/2017 11:19:01 PM       Devoria AlbeIva Vernell Back, MD, Concha PyoFACEP    Vidyuth Belsito, MD 10/26/17 [phone]

## 2017-10-27 ENCOUNTER — Emergency Department (HOSPITAL_COMMUNITY)
Admission: EM | Admit: 2017-10-27 | Discharge: 2017-10-27 | Disposition: A | Discharge status: Home / Self Care | Payer: BLUE CROSS/BLUE SHIELD | Attending: Emergency Medicine | Admitting: Emergency Medicine

## 2017-10-27 ENCOUNTER — Emergency Department (HOSPITAL_COMMUNITY): Payer: BLUE CROSS/BLUE SHIELD

## 2017-10-27 ENCOUNTER — Encounter (HOSPITAL_COMMUNITY): Payer: Self-pay | Admitting: Emergency Medicine

## 2017-10-27 DIAGNOSIS — J101 Influenza due to other identified influenza virus with other respiratory manifestations: Secondary | ICD-10-CM | POA: Insufficient documentation

## 2017-10-27 DIAGNOSIS — G4733 Obstructive sleep apnea (adult) (pediatric): Secondary | ICD-10-CM | POA: Diagnosis not present

## 2017-10-27 DIAGNOSIS — E876 Hypokalemia: Secondary | ICD-10-CM | POA: Diagnosis not present

## 2017-10-27 DIAGNOSIS — Z880 Allergy status to penicillin: Secondary | ICD-10-CM | POA: Insufficient documentation

## 2017-10-27 DIAGNOSIS — Z96652 Presence of left artificial knee joint: Secondary | ICD-10-CM | POA: Diagnosis not present

## 2017-10-27 DIAGNOSIS — J449 Chronic obstructive pulmonary disease, unspecified: Secondary | ICD-10-CM | POA: Insufficient documentation

## 2017-10-27 DIAGNOSIS — Z7982 Long term (current) use of aspirin: Secondary | ICD-10-CM | POA: Insufficient documentation

## 2017-10-27 DIAGNOSIS — E039 Hypothyroidism, unspecified: Secondary | ICD-10-CM | POA: Insufficient documentation

## 2017-10-27 DIAGNOSIS — Z79899 Other long term (current) drug therapy: Secondary | ICD-10-CM | POA: Diagnosis not present

## 2017-10-27 DIAGNOSIS — I10 Essential (primary) hypertension: Secondary | ICD-10-CM | POA: Diagnosis not present

## 2017-10-27 DIAGNOSIS — R05 Cough: Secondary | ICD-10-CM | POA: Diagnosis not present

## 2017-10-27 LAB — CBC WITH DIFFERENTIAL/PLATELET
BASOS PCT: 0 %
Basophils Absolute: 0 10*3/uL (ref 0.0–0.1)
Eosinophils Absolute: 0 10*3/uL (ref 0.0–0.7)
Eosinophils Relative: 0 %
HCT: 39.8 % (ref 36.0–46.0)
Hemoglobin: 12.9 g/dL (ref 12.0–15.0)
Lymphocytes Relative: 7 %
Lymphs Abs: 0.8 10*3/uL (ref 0.7–4.0)
MCH: 27.8 pg (ref 26.0–34.0)
MCHC: 32.4 g/dL (ref 30.0–36.0)
MCV: 85.8 fL (ref 78.0–100.0)
MONO ABS: 0.9 10*3/uL (ref 0.1–1.0)
MONOS PCT: 7 %
NEUTROS ABS: 10.9 10*3/uL — AB (ref 1.7–7.7)
Neutrophils Relative %: 86 %
Platelets: 196 10*3/uL (ref 150–400)
RBC: 4.64 MIL/uL (ref 3.87–5.11)
RDW: 14.2 % (ref 11.5–15.5)
WBC: 12.6 10*3/uL — ABNORMAL HIGH (ref 4.0–10.5)

## 2017-10-27 LAB — INFLUENZA PANEL BY PCR (TYPE A & B)
INFLAPCR: POSITIVE — AB
INFLBPCR: NEGATIVE

## 2017-10-27 LAB — COMPREHENSIVE METABOLIC PANEL
ALBUMIN: 3.6 g/dL (ref 3.5–5.0)
ALT: 30 U/L (ref 14–54)
ANION GAP: 15 (ref 5–15)
AST: 37 U/L (ref 15–41)
Alkaline Phosphatase: 93 U/L (ref 38–126)
BUN: 18 mg/dL (ref 6–20)
CO2: 26 mmol/L (ref 22–32)
Calcium: 8.8 mg/dL — ABNORMAL LOW (ref 8.9–10.3)
Chloride: 96 mmol/L — ABNORMAL LOW (ref 101–111)
Creatinine, Ser: 0.83 mg/dL (ref 0.44–1.00)
GLUCOSE: 152 mg/dL — AB (ref 65–99)
POTASSIUM: 2.8 mmol/L — AB (ref 3.5–5.1)
Sodium: 137 mmol/L (ref 135–145)
TOTAL PROTEIN: 7 g/dL (ref 6.5–8.1)
Total Bilirubin: 0.4 mg/dL (ref 0.3–1.2)

## 2017-10-27 MED ORDER — POTASSIUM CHLORIDE CRYS ER 20 MEQ PO TBCR
40.0000 meq | EXTENDED_RELEASE_TABLET | Freq: Once | ORAL | Status: AC
Start: 2017-10-27 — End: 2017-10-27
  Administered 2017-10-27: 40 meq via ORAL
  Filled 2017-10-27: qty 2

## 2017-10-27 MED ORDER — ONDANSETRON 4 MG PO TBDP
4.0000 mg | ORAL_TABLET | Freq: Once | ORAL | Status: AC
  Administered 2017-10-27: 4 mg via ORAL
  Filled 2017-10-27: qty 1

## 2017-10-27 MED ORDER — ONDANSETRON 4 MG PO TBDP: 4.0000 mg | ORAL_TABLET | Freq: Three times a day (TID) | ORAL | 0 refills | Status: DC | PRN

## 2017-10-27 MED ORDER — ACETAMINOPHEN 500 MG PO TABS
1000.0000 mg | ORAL_TABLET | Freq: Once | ORAL | Status: AC
  Administered 2017-10-27: 1000 mg via ORAL
  Filled 2017-10-27: qty 2

## 2017-10-27 NOTE — ED Triage Notes (Signed)
Pt reports she was sent home from work for fever and cough.  States she has been short of breath with exertion.  Temp was 100.2 at work.

## 2017-10-27 NOTE — ED Provider Notes (Signed)
[facility] EMERGENCY DEPARTMENT Provider Note   CSN: [id number] Arrival date & time: 10/27/17  1428     History   Chief Complaint Chief Complaint  Patient presents with  . Shortness of Breath    HPI Connie [name] is a 52 y.o. female.  The history is provided by the patient. No language interpreter was used.  Cough  This is a new problem. The problem occurs constantly. The problem has been gradually worsening. The cough is non-productive. There has been no fever. Pertinent negatives include no chest pain. She has tried nothing for the symptoms. The treatment provided no relief. She is not a smoker. Her past medical history does not include pneumonia.    Past Medical History:  Diagnosis Date  . Arthritis   . Back pain 10/30/2015  . Bilateral shoulder pain 10/30/2015  . COPD (chronic obstructive pulmonary disease) (HCC)   . Fecal occult blood test positive 10/30/2015  . Hemorrhoids 10/30/2015  . Hypertension   . Hypothyroidism   . Large breasts 10/30/2015   44 DD  . Postmenopausal 10/30/2015  . Sleep apnea    pt DX but did not want to use CPAP.  Marland Kitchen. Thyroid disease     Patient Active Problem List   Diagnosis Date Noted  . Status post total knee replacement, left 10/14/2017  . Special screening for malignant neoplasms, colon   . Hemorrhoids 10/30/2015  . Fecal occult blood test positive 10/30/2015  . Postmenopausal 10/30/2015  . Large breasts 10/30/2015  . Bilateral shoulder pain 10/30/2015  . Back pain 10/30/2015  . Medial meniscus tear   . Lateral meniscus tear, current   . Erosive lichen planus of vulva 08/28/2014  . 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 Right hip flexor tightness 12/06/2013  . Plantar fasciitis 12/06/2013    Past Surgical History:  Procedure Laterality Date  . COLONOSCOPY N/A 11/12/2015   Procedure: COLONOSCOPY;  Surgeon: West BaliSandi L Fields, MD;  Location: [facility];  Service: Endoscopy;  Laterality: N/A;  8:30 AM  . JOINT REPLACEMENT    . KNEE ARTHROSCOPY  WITH MEDIAL MENISECTOMY Left 05/04/2015   Procedure: KNEE ARTHROSCOPY WITH MEDIAL AND LATERAL MENISECTOMY;  Surgeon: Vickki HearingStanley E Harrison, MD;  Location: [facility] ORS;  Service: Orthopedics;  Laterality: Left;  . NASAL SINUS SURGERY    . TOTAL KNEE ARTHROPLASTY Left 01/29/2017   Procedure: TOTAL KNEE ARTHROPLASTY;  Surgeon: Vickki HearingHarrison, Stanley E, MD;  Location: [facility] ORS;  Service: Orthopedics;  Laterality: Left;    OB History    Gravida Para Term Preterm AB Living   0 0 0 0 0 0   SAB TAB Ectopic Multiple Live Births   0 0 0 0         Home Medications    Prior to Admission medications   Medication Sig Start Date End Date Taking? Authorizing Provider  aspirin EC 325 MG EC tablet Take 1 tablet (325 mg total) by mouth daily with breakfast. Patient not taking: Reported on 10/14/2017 02/01/17   Vickki HearingHarrison, Stanley E, MD  bisacodyl (DULCOLAX) 5 MG EC tablet Take 1 tablet (5 mg total) by mouth daily as needed for moderate constipation. Patient not taking: Reported on 04/17/2017 01/31/17   Vickki HearingHarrison, Stanley E, MD  budesonide-formoterol [facility](SYMBICORT) 80-4.5 MCG/ACT inhaler Inhale 2 puffs into the lungs 2 (two) times daily.    [provider]  clindamycin (CLEOCIN) 300 MG capsule TWO CAPS BY MOUTH 1 HOUR PRIOR TO DENTAL WORK Patient not taking: Reported on 10/14/2017 03/02/17  Vickki Hearing, MD  clobetasol cream (TEMOVATE) 0.05 % Apply 1 application topically 2 (two) times daily as needed.     [provider]  cyanocobalamin 500 MCG tablet Take 500 mcg by mouth daily.    [provider]  diclofenac (VOLTAREN) 75 MG EC tablet Take 75 mg by mouth 2 (two) times daily.    [provider]  hydrochlorothiazide (HYDRODIURIL) 25 MG tablet Take 25 mg by mouth daily.    [provider]  HYDROcodone-acetaminophen (NORCO/VICODIN) 5-325 MG tablet Take 1 tablet by mouth every 12 (twelve) hours as needed for moderate pain. Patient not taking: Reported on 10/14/2017 05/19/17    Vickki Hearing, MD  levothyroxine (SYNTHROID, LEVOTHROID) 25 MCG tablet Take 25 mcg by mouth daily before breakfast.    [provider]  methocarbamol (ROBAXIN) 500 MG tablet Take 1 tablet (500 mg total) by mouth every 6 (six) hours as needed for muscle spasms. Patient not taking: Reported on 10/14/2017 01/31/17   Vickki Hearing, MD  Potassium Bicarb & Chloride (POT BICARB-POT CHLORIDE PO) Take 250 mg by mouth.    [provider]  potassium chloride SA (K-DUR,KLOR-CON) 20 MEQ tablet Take 2 tablets (40 mEq total) by mouth 3 (three) times daily. Patient taking differently: Take 99 mEq by mouth 2 (two) times daily.  09/23/16   Vickki Hearing, MD  pravastatin (PRAVACHOL) 20 MG tablet Take 20 mg by mouth every evening.     [provider]  TURMERIC PO Take 1 tablet by mouth daily.    [provider]  Vitamin D, Ergocalciferol, (DRISDOL) 50000 units CAPS capsule once a week. 10/13/17   [provider]    Family History Family History  Problem Relation Age of Onset  . Alzheimer's disease Mother   . Cancer Father        brain tumor  . Diabetes Brother   . Heart disease Brother   . Heart attack Brother   . Cancer Brother        melanoma  . Lupus Maternal Grandmother   . Heart disease Maternal Grandmother   . Heart disease Maternal Grandfather   . Hypertension Maternal Grandfather     Social History Social History   Tobacco Use  . Smoking status: Never Smoker  . Smokeless tobacco: Never Used  Substance Use Topics  . Alcohol use: No    Frequency: Never    Comment: socially   . Drug use: No     Allergies   Penicillins   Review of Systems Review of Systems  Respiratory: Positive for cough.   Cardiovascular: Negative for chest pain.  All other systems reviewed and are negative.    Physical Exam Updated Vital Signs BP 138/87 (BP Location: Right Arm)   Pulse (!) 105   Temp 98.3 F (36.8 C) (Oral)   Resp 18   Ht 5\' 3"   (1.6 m)   Wt 102.1 kg (225 lb)   LMP 04/14/2011   SpO2 93%   BMI 39.86 kg/m   Physical Exam  Constitutional: She is oriented to person, place, and time. She appears well-developed and well-nourished.  HENT:  Head: Normocephalic.  Mouth/Throat: Oropharynx is clear and moist.  Eyes: EOM are normal.  Neck: Normal range of motion.  Pulmonary/Chest: Effort normal and breath sounds normal.  Abdominal: Soft. She exhibits no distension.  Musculoskeletal: Normal range of motion.       Right lower leg: Normal.  Neurological: She is alert and oriented to person,  place, and time.  Skin: Skin is warm.  Psychiatric: She has a normal mood and affect.  Nursing note and vitals reviewed.    ED Treatments / Results  Labs (all labs ordered are listed, but only abnormal results are displayed) Labs Reviewed  CBC WITH DIFFERENTIAL/PLATELET - Abnormal; Notable for the following components:      Result Value   WBC 12.6 (*)    Neutro Abs 10.9 (*)    All other components within normal limits  COMPREHENSIVE METABOLIC PANEL - Abnormal; Notable for the following components:   Potassium 2.8 (*)    Chloride 96 (*)    Glucose, Bld 152 (*)    Calcium 8.8 (*)    All other components within normal limits  INFLUENZA PANEL BY PCR (TYPE A & B) - Abnormal; Notable for the following components:   Influenza A By PCR POSITIVE (*)    All other components within normal limits    EKG  EKG Interpretation None       Radiology Dg Chest 2 View  Result Date: 10/27/2017 CLINICAL DATA:  Chest and back pain with cough EXAM: CHEST  2 VIEW COMPARISON:  09/03/2013 FINDINGS: Cardiac shadow is stable. The lungs are well aerated bilaterally. Mild bibasilar atelectatic changes are noted. No sizable effusion is seen. No other focal abnormality is noted. IMPRESSION: Bibasilar atelectatic changes Electronically Signed   By: Alcide Clever M.D.   On: 10/27/2017 15:03    Procedures Procedures (including critical care  time)  Medications Ordered in ED Medications  potassium chloride SA (K-DUR,KLOR-CON) CR tablet 40 mEq (not administered)  ondansetron (ZOFRAN-ODT) disintegrating tablet 4 mg (not administered)  acetaminophen (TYLENOL) tablet 1,000 mg (1,000 mg Oral Given 10/27/17 1500)  ondansetron (ZOFRAN-ODT) disintegrating tablet 4 mg (4 mg Oral Given 10/27/17 1500)     Initial Impression / Assessment and Plan / ED Course  I have reviewed the triage vital signs and the nursing notes.  Pertinent labs & imaging results that were available during my care of the patient were reviewed by me and considered in my medical decision making (see chart for details).     Pt counseled on potassium.  Pt reports she takes at home.  Pt given zofran. Pt able to tolerate po fluids.    Final Clinical Impressions(s) / ED Diagnoses   Final diagnoses:  Influenza A  Hypokalemia    ED Discharge Orders        Ordered    ondansetron (ZOFRAN ODT) 4 MG disintegrating tablet  Every 8 hours PRN     10/27/17 1828    An After Visit Summary was printed and given to the patient.    Elson Areas, PA-C 10/28/17 1617    Samuel Jester, DO 10/28/17 1710

## 2017-10-27 NOTE — Discharge Instructions (Signed)
Return if any problems.  Drink plenty of fluids.   °

## 2017-11-04 DIAGNOSIS — G4733 Obstructive sleep apnea (adult) (pediatric): Secondary | ICD-10-CM | POA: Diagnosis not present

## 2017-11-05 NOTE — Progress Notes (Deleted)
Office Visit Note  Patient: [name]             Date of Birth: 10-14-1965           MRN: [id number]             PCP: Sharilyn Sites, MD Referring: Sharilyn Sites, MD Visit Date: 11/16/2017 Occupation: _0 @    Subjective:  No chief complaint on file.   History of Present Illness: [name] is a 52 y.o. female ***   Activities of Daily Living:  Patient reports morning stiffness for *** {minute/hour:19697}.   Patient {ACTIONS;DENIES/REPORTS:21021675::"Denies"} nocturnal pain.  Difficulty dressing/grooming: {ACTIONS;DENIES/REPORTS:21021675::"Denies"} Difficulty climbing stairs: {ACTIONS;DENIES/REPORTS:21021675::"Denies"} Difficulty getting out of chair: {ACTIONS;DENIES/REPORTS:21021675::"Denies"} Difficulty using hands for taps, buttons, cutlery, and/or writing: {ACTIONS;DENIES/REPORTS:21021675::"Denies"}   No Rheumatology ROS completed.   PMFS History:  Patient Active Problem List   Diagnosis Date Noted  . Status post total knee replacement, left 10/14/2017  . Special screening for malignant neoplasms, colon   . Hemorrhoids 10/30/2015  . Fecal occult blood test positive 10/30/2015  . Postmenopausal 10/30/2015  . Large breasts 10/30/2015  . Bilateral shoulder pain 10/30/2015  . Back pain 10/30/2015  . Medial meniscus tear   . Lateral meniscus tear, current   . Erosive lichen planus of vulva 08/28/2014  . 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 Right hip flexor tightness 12/06/2013  . Plantar fasciitis 12/06/2013    Past Medical History:  Diagnosis Date  . Arthritis   . Back pain 10/30/2015  . Bilateral shoulder pain 10/30/2015  . COPD (chronic obstructive pulmonary disease) ([place])   . Fecal occult blood test positive 10/30/2015  . Hemorrhoids 10/30/2015  . Hypertension   . Hypothyroidism   . Large breasts 10/30/2015   44 DD  . Postmenopausal 10/30/2015  . Sleep apnea    pt DX but did not want to use CPAP.  Marland Kitchen Thyroid disease     Family History  Problem Relation Age of  Onset  . Alzheimer's disease Mother   . Cancer Father        brain tumor  . Diabetes Brother   . Heart disease Brother   . Heart attack Brother   . Cancer Brother        melanoma  . Lupus Maternal Grandmother   . Heart disease Maternal Grandmother   . Heart disease Maternal Grandfather   . Hypertension Maternal Grandfather    Past Surgical History:  Procedure Laterality Date  . COLONOSCOPY N/A 11/12/2015   Procedure: COLONOSCOPY;  Surgeon: Danie Binder, MD;  Location: [facility];  Service: Endoscopy;  Laterality: N/A;  8:30 AM  . JOINT REPLACEMENT    . KNEE ARTHROSCOPY WITH MEDIAL MENISECTOMY Left 05/04/2015   Procedure: KNEE ARTHROSCOPY WITH MEDIAL AND LATERAL MENISECTOMY;  Surgeon: Carole Civil, MD;  Location: [facility] OR[facility];  Service: Orthopedics;  Laterality: Left;  . NASAL SINUS SURGERY    . TOTAL KNEE ARTHROPLASTY Left 01/29/2017   Procedure: TOTAL KNEE ARTHROPLASTY;  Surgeon: Carole Civil, MD;  Location: [facility] OR[facility];  Service: Orthopedics;  Laterality: Left;   Social History   Social History Narrative  . Not on file     Objective: Vital Signs: LMP 04/14/2011    Physical Exam   Musculoskeletal Exam: ***  CDAI Exam: No CDAI exam completed.    Investigation: No additional findings.  Component     Latest Ref Rng & Units 10/14/2017  Uric Acid, Serum     2.5 - 7.0 mg/dL 5.7  RA Latex Turbid.     <14 IU/mL 17 (H)  Angiotensin-Converting Enzyme     9 - 67 U/L 13  HLA-B27 Antigen     Negative Negative  Sed Rate     0 - 30 mm/h 14   CBC Latest Ref Rng & Units 10/27/2017 10/14/2017 01/31/2017  WBC 4.0 - 10.5 K/uL 12.6(H) 8.6 15.7(H)  Hemoglobin 12.0 - 15.0 g/dL 12.9 13.9 12.5  Hematocrit 36.0 - 46.0 % 39.8 41.8 38.1  Platelets 150 - 400 K/uL 196 222 187   CMP Latest Ref Rng & Units 10/27/2017 10/14/2017 01/30/2017  Glucose 65 - 99 mg/dL 152(H) 96 128(H)  BUN 6 - 20 mg/dL _0 Creatinine 0.44 - 1.00 mg/dL 0.83 0.68 0.54  Sodium 135 - 145 mmol/L 137 140 139    Potassium 3.5 - 5.1 mmol/L 2.8(L) 4.1 4.0  Chloride 101 - 111 mmol/L 96(L) 101 104  CO2 22 - 32 mmol/L 26 34(H) 30  Calcium 8.9 - 10.3 mg/dL 8.8(L) 9.4 8.2(L)  Total Protein 6.5 - 8.1 g/dL 7.0 7.1 -  Total Bilirubin 0.3 - 1.2 mg/dL 0.4 0.4 -  Alkaline Phos 38 - 126 U/L 93 - -  AST 15 - 41 U/L 37 19 -  ALT 14 - 54 U/L 30 22 -    Imaging: Dg Chest 2 View  Result Date: 10/27/2017 CLINICAL DATA:  Chest and back pain with cough EXAM: CHEST  2 VIEW COMPARISON:  09/03/2013 FINDINGS: Cardiac shadow is stable. The lungs are well aerated bilaterally. Mild bibasilar atelectatic changes are noted. No sizable effusion is seen. No other focal abnormality is noted. IMPRESSION: Bibasilar atelectatic changes Electronically Signed   By: Inez Catalina M.D.   On: 10/27/2017 15:03   Xr Foot 2 Views Left  Result Date: 10/14/2017 PIP/DIP narrowing was noted. No erosive changes were noted. No intertarsal joint space narrowing was noted. Calcaneal spur was noted. Impression: These findings are consistent with osteoarthritis of the foot.  Xr Foot 2 Views Right  Result Date: 10/14/2017 PIP/DIP narrowing was noted. No erosive changes were noted. No intertarsal joint space narrowing was noted. Calcaneal spur was noted. Impression: These findings are consistent with osteoarthritis of the foot.  Xr Hand 2 View Left  Result Date: 10/14/2017 Severe PIP/DIP narrowing was noted without any erosive changes. No MCP narrowing or intercarpal or radiocarpal joint space narrowing was noted. Impression: These findings are consistent with osteoarthritis of the hand.  Xr Hand 2 View Right  Result Date: 10/14/2017 Severe PIP/DIP narrowing was noted without any erosive changes. No MCP narrowing or intercarpal or radiocarpal joint space narrowing was noted. Impression: These findings are consistent with osteoarthritis of the hand.  Xr Knee 3 View Right  Result Date: 10/14/2017 Moderate medial compartment narrowing intercondylar  osteophytes and lateral osteophytes was noted. No chondrocalcinosis was noted. Severe patellofemoral narrowing was noted. Impression: These findings are consistent with moderate osteoarthritis and severe chondromalacia patella.  Xr Shoulder Left  Result Date: 10/14/2017 No glenohumeral joint space narrowing was noted. No chondrocalcinosis was noted. No acromioclavicular joint space narrowing was noted. Impression: Unremarkable x-ray of the left shoulder.  Xr Shoulder Right  Result Date: 10/14/2017 No glenohumeral joint space narrowing was noted. No chondrocalcinosis was noted. No acromioclavicular joint space narrowing was noted. Impression: Unremarkable x-ray of the right shoulder.   Speciality Comments: No specialty comments available.    Procedures:  No procedures performed Allergies: Penicillins   Assessment / Plan:     Visit Diagnoses:  No diagnosis found.    Orders: No orders of the defined types were placed in this encounter.  No orders of the defined types were placed in this encounter.   Face-to-face time spent with patient was *** minutes. 50% of time was spent in counseling and coordination of care.  Follow-Up Instructions: No Follow-up on file.   Ofilia Neas, PA-C  Note - This record has been created using Dragon software.  Chart creation errors have been sought, but may not always  have been located. Such creation errors do not reflect on  the standard of medical care.

## 2017-11-09 ENCOUNTER — Ambulatory Visit
Admission: RE | Admit: 2017-11-09 | Discharge: 2017-11-09 | Disposition: A | Discharge status: Home / Self Care | Payer: BLUE CROSS/BLUE SHIELD | Source: Ambulatory Visit | Attending: Family Medicine | Admitting: Family Medicine

## 2017-11-09 DIAGNOSIS — Z1231 Encounter for screening mammogram for malignant neoplasm of breast: Secondary | ICD-10-CM

## 2017-11-16 ENCOUNTER — Ambulatory Visit: Payer: BLUE CROSS/BLUE SHIELD | Admitting: Rheumatology

## 2017-11-23 ENCOUNTER — Telehealth: Payer: Self-pay | Admitting: Orthopedic Surgery

## 2017-11-23 NOTE — Telephone Encounter (Signed)
Patient inquiring about appointment for right knee pain, which has been increasing; states Dr Corliss Skainseveshwar has done recent Xrays - noted, per her Epic chart.  Per current schedule, offered this Wed, 11/25/17, in cancellation slot, unable to come then, due to work.  Appointment pending.

## 2017-11-24 NOTE — Telephone Encounter (Signed)
Called patient, scheduled appointment in cancellation slot for tomorrow, 11/25/2017, aware.

## 2017-11-25 ENCOUNTER — Ambulatory Visit: Payer: BLUE CROSS/BLUE SHIELD | Admitting: Orthopedic Surgery

## 2017-11-25 VITALS — BP 131/86 | HR 79 | Ht 63.0 in | Wt 224.0 lb

## 2017-11-25 DIAGNOSIS — M1710 Unilateral primary osteoarthritis, unspecified knee: Secondary | ICD-10-CM

## 2017-11-25 DIAGNOSIS — Z96652 Presence of left artificial knee joint: Secondary | ICD-10-CM | POA: Diagnosis not present

## 2017-11-25 NOTE — Progress Notes (Signed)
Lat visit: Postop visit  Patient had a left total knee comes in for routine follow-up  Major complaint today is soreness in the left knee and swelling of the left leg which is intermittent  She had surgery on 01/29/2017  She is a good go back to work  She is a stable knee which is full extension flexion is 110  We refilled her pain medication with appropriate opioid precautions follow-up for one year x-ray in May  No diagnosis found. No outpatient medications have been marked as taking for the 11/25/17 encounter (Appointment) with Vickki Hearing, MD.    Progress Note   Patient ID: Junious Dresser, female   DOB: 1966-04-20, 52 y.o.   MRN: [id number]  Chief Complaint  Patient presents with  . Follow-up    Recheck on right knee    52 year old female with complaints of right knee pain.  She did have a left total knee did reasonably well  She is complaining of pain and swelling in the right knee now for several weeks she also had some radicular pain on the lateral side of the thigh and down into the shin which has subsequently resolved and localized to dull aching pain in the right knee.  It is primarily lateral knee pain associated with intermittent swelling and some difficulty when walking.  She did have several days when she was without her diclofenac and her symptoms seem to get worse now that she is back on the diclofenac her symptoms have gotten better     Review of Systems  Constitutional: Negative for chills, fever and weight loss.  Respiratory: Negative for shortness of breath.   Cardiovascular: Negative for chest pain.  Musculoskeletal: Positive for joint pain.  Neurological: Negative for tingling.   No outpatient medications have been marked as taking for the 11/25/17 encounter (Appointment) with Vickki Hearing, MD.    Past Medical History:  Diagnosis Date  . Arthritis   . Back pain 10/30/2015  . Bilateral shoulder pain 10/30/2015  . COPD (chronic obstructive  pulmonary disease) (HCC)   . Fecal occult blood test positive 10/30/2015  . Hemorrhoids 10/30/2015  . Hypertension   . Hypothyroidism   . Large breasts 10/30/2015   44 DD  . Postmenopausal 10/30/2015  . Sleep apnea    pt DX but did not want to use CPAP.  Marland Kitchen Thyroid disease      Allergies  Allergen Reactions  . Penicillins Other (See Comments)    Yeast infection Has patient had a PCN reaction causing immediate rash, facial/tongue/throat swelling, SOB or lightheadedness with hypotension: No Has patient had a PCN reaction causing severe rash involving mucus membranes or skin necrosis: No Has patient had a PCN reaction that required hospitalization No Has patient had a PCN reaction occurring within the last 10 years: No If all of the above answers are "NO", then may proceed with Cephalosporin use.     LMP 04/14/2011    Physical Exam  Constitutional: She is oriented to person, place, and time. She appears well-developed and well-nourished.  Neurological: She is alert and oriented to person, place, and time. Gait normal.  Psychiatric: She has a normal mood and affect. Judgment normal.  Vitals reviewed.   Ortho Exam  Left knee well-healed incision knee full extension flexion 110 degrees no instability normal strength good straight leg raise neurovascular exam intact  Right knee medial lateral scars from previous surgery lateral joint line tenderness medial joint line tenderness I cannot detect if she  has an effusion strength and stability are normal; her neuro vascular exam intact   Medical decision-making  Imaging: X-ray was done by the previous physician on the right knee on January 23 she has a moderate amount of osteoarthritis in that knee joint with a mild to moderate amount of varus  Encounter Diagnoses  Name Primary?  . History of left knee replacement Yes  . Arthritis of knee     Procedure note right knee injection verbal consent was obtained to inject right knee  joint  Timeout was completed to confirm the site of injection  The medications used were 40 mg of Depo-Medrol and 1% lidocaine 3 cc  Anesthesia was provided by ethyl chloride and the skin was prepped with alcohol.  After cleaning the skin with alcohol a 20-gauge needle was used to inject the right knee joint. There were no complications. A sterile bandage was applied.  1 month fu    Fuller CanadaStanley Azaylea Maves, MD 11/25/2017 3:57 PM

## 2017-11-25 NOTE — Patient Instructions (Addendum)
Continue diclofenac  Use tylenol for extra pain relief   Keep you weight under control    Osteoarthritis Osteoarthritis is a type of arthritis that affects tissue that covers the ends of bones in joints (cartilage). Cartilage acts as a cushion between the bones and helps them move smoothly. Osteoarthritis results when cartilage in the joints gets worn down. Osteoarthritis is sometimes called "wear and tear" arthritis. Osteoarthritis is the most common form of arthritis. It often occurs in older people. It is a condition that gets worse over time (a progressive condition). Joints that are most often affected by this condition are in:  Fingers.  Toes.  Hips.  Knees.  Spine, including neck and lower back.  What are the causes? This condition is caused by age-related wearing down of cartilage that covers the ends of bones. What increases the risk? The following factors may make you more likely to develop this condition:  Older age.  Being overweight or obese.  Overuse of joints, such as in athletes.  Past injury of a joint.  Past surgery on a joint.  Family history of osteoarthritis.  What are the signs or symptoms? The main symptoms of this condition are pain, swelling, and stiffness in the joint. The joint may lose its shape over time. Small pieces of bone or cartilage may break off and float inside of the joint, which may cause more pain and damage to the joint. Small deposits of bone (osteophytes) may grow on the edges of the joint. Other symptoms may include:  A grating or scraping feeling inside the joint when you move it.  Popping or creaking sounds when you move.  Symptoms may affect one or more joints. Osteoarthritis in a major joint, such as your knee or hip, can make it painful to walk or exercise. If you have osteoarthritis in your hands, you might not be able to grip items, twist your hand, or control small movements of your hands and fingers (fine motor  skills). How is this diagnosed? This condition may be diagnosed based on:  Your medical history.  A physical exam.  Your symptoms.  X-rays of the affected joint(s).  Blood tests to rule out other types of arthritis.  How is this treated? There is no cure for this condition, but treatment can help to control pain and improve joint function. Treatment plans may include:  A prescribed exercise program that allows for rest and joint relief. You may work with a physical therapist.  A weight control plan.  Pain relief techniques, such as: ? Applying heat and cold to the joint. ? Electric pulses delivered to nerve endings under the skin (transcutaneous electrical nerve stimulation, or TENS). ? Massage. ? Certain nutritional supplements.  NSAIDs or prescription medicines to help relieve pain.  Medicine to help relieve pain and inflammation (corticosteroids). This can be given by mouth (orally) or as an injection.  Assistive devices, such as a brace, wrap, splint, specialized glove, or cane.  Surgery, such as: ? An osteotomy. This is done to reposition the bones and relieve pain or to remove loose pieces of bone and cartilage. ? Joint replacement surgery. You may need this surgery if you have very bad (advanced) osteoarthritis.  Follow these instructions at home: Activity  Rest your affected joints as directed by your health care provider.  Do not drive or use heavy machinery while taking prescription pain medicine.  Exercise as directed. Your health care provider or physical therapist may recommend specific types of exercise, such  as: ? Strengthening exercises. These are done to strengthen the muscles that support joints that are affected by arthritis. They can be performed with weights or with exercise bands to add resistance. ? Aerobic activities. These are exercises, such as brisk walking or water aerobics, that get your heart pumping. ? Range-of-motion activities. These  keep your joints easy to move. ? Balance and agility exercises. Managing pain, stiffness, and swelling  If directed, apply heat to the affected area as often as told by your health care provider. Use the heat source that your health care provider recommends, such as a moist heat pack or a heating pad. ? If you have a removable assistive device, remove it as told by your health care provider. ? Place a towel between your skin and the heat source. If your health care provider tells you to keep the assistive device on while you apply heat, place a towel between the assistive device and the heat source. ? Leave the heat on for 20-30 minutes. ? Remove the heat if your skin turns bright red. This is especially important if you are unable to feel pain, heat, or cold. You may have a greater risk of getting burned.  If directed, put ice on the affected joint: ? If you have a removable assistive device, remove it as told by your health care provider. ? Put ice in a plastic bag. ? Place a towel between your skin and the bag. If your health care provider tells you to keep the assistive device on during icing, place a towel between the assistive device and the bag. ? Leave the ice on for 20 minutes, 2-3 times a day. General instructions  Take over-the-counter and prescription medicines only as told by your health care provider.  Maintain a healthy weight. Follow instructions from your health care provider for weight control. These may include dietary restrictions.  Do not use any products that contain nicotine or tobacco, such as cigarettes and e-cigarettes. These can delay bone healing. If you need help quitting, ask your health care provider.  Use assistive devices as directed by your health care provider.  Keep all follow-up visits as told by your health care provider. This is important. Where to find more information:  [employer]culoskeletal [employer] Skin [employer]:  www.niams.http://www.myers.net/nih.gov  [employer] Aging: https://walker.com/www.nia.nih.gov  American College of Rheumatology: www.rheumatology.org Contact a health care provider if:  Your skin turns red.  You develop a rash.  You have pain that gets worse.  You have a fever along with joint or muscle aches. Get help right away if:  You lose a lot of weight.  You suddenly lose your appetite.  You have night sweats. Summary  Osteoarthritis is a type of arthritis that affects tissue covering the ends of bones in joints (cartilage).  This condition is caused by age-related wearing down of cartilage that covers the ends of bones.  The main symptom of this condition is pain, swelling, and stiffness in the joint.  There is no cure for this condition, but treatment can help to control pain and improve joint function. This information is not intended to replace advice given to you by your health care provider. Make sure you discuss any questions you have with your health care provider. Document Released: 09/08/2005 Document Revised: 05/12/2016 Document Reviewed: 05/12/2016 Elsevier Interactive Patient Education  [employer]2018 Elsevier Inc.

## 2017-12-04 DIAGNOSIS — E039 Hypothyroidism, unspecified: Secondary | ICD-10-CM | POA: Insufficient documentation

## 2017-12-04 DIAGNOSIS — M19041 Primary osteoarthritis, right hand: Secondary | ICD-10-CM | POA: Insufficient documentation

## 2017-12-04 DIAGNOSIS — M19072 Primary osteoarthritis, left ankle and foot: Secondary | ICD-10-CM

## 2017-12-04 DIAGNOSIS — M19071 Primary osteoarthritis, right ankle and foot: Secondary | ICD-10-CM | POA: Insufficient documentation

## 2017-12-04 DIAGNOSIS — E785 Hyperlipidemia, unspecified: Secondary | ICD-10-CM | POA: Insufficient documentation

## 2017-12-04 DIAGNOSIS — M1711 Unilateral primary osteoarthritis, right knee: Secondary | ICD-10-CM | POA: Insufficient documentation

## 2017-12-04 DIAGNOSIS — Z8639 Personal history of other endocrine, nutritional and metabolic disease: Secondary | ICD-10-CM | POA: Insufficient documentation

## 2017-12-04 DIAGNOSIS — M19042 Primary osteoarthritis, left hand: Principal | ICD-10-CM

## 2017-12-04 NOTE — Progress Notes (Signed)
Office Visit Note  Patient: [name]             Date of Birth: [birth year]           MRN: [id number]             PCP: Sharilyn Sites, MD Referring: Sharilyn Sites, MD Visit Date: 12/10/2017 Occupation: '@GUAROCC' @    Subjective:  Pain and swelling in joints..   History of Present Illness: [name] is a 52 y.o. female G of polyarthralgia.  She states she continues to have pain and discomfort in her bilateral hands, bilateral knee joints and bilateral ankles.  She has noticed some swelling in her left knee and both ankles.  She continues to have some discomfort from plantar fasciitis.  Activities of Daily Living:  Patient reports morning stiffness for 30 minutes.   Patient Reports nocturnal pain.  Difficulty dressing/grooming: Denies Difficulty climbing stairs: Reports Difficulty getting out of chair: Denies Difficulty using hands for taps, buttons, cutlery, and/or writing: Reports   Review of Systems  Constitutional: Positive for fatigue. Negative for night sweats, weight gain and weight loss.  HENT: Positive for mouth dryness. Negative for mouth sores, trouble swallowing, trouble swallowing and nose dryness.   Eyes: Negative for pain, redness, visual disturbance and dryness.  Respiratory: Negative for cough, shortness of breath and difficulty breathing.        COPD   Cardiovascular: Negative for chest pain, palpitations, hypertension, irregular heartbeat and swelling in legs/feet.  Gastrointestinal: Negative for abdominal pain, blood in stool, constipation and diarrhea.  Endocrine: Negative for increased urination.  Genitourinary: Negative for pelvic pain and vaginal dryness.  Musculoskeletal: Positive for joint swelling and morning stiffness. Negative for arthralgias, joint pain, myalgias, muscle weakness, muscle tenderness and myalgias.  Skin: Negative for color change, rash, hair loss, skin tightness, ulcers and sensitivity to sunlight.  Allergic/Immunologic: Negative  for susceptible to infections.  Neurological: Negative for dizziness, headaches, memory loss, night sweats and weakness.  Hematological: Negative for bruising/bleeding tendency and swollen glands.  Psychiatric/Behavioral: Negative for depressed mood and sleep disturbance. The patient is not nervous/anxious.     PMFS History:  Patient Active Problem List   Diagnosis Date Noted  . Primary osteoarthritis of both hands 12/04/2017  . Primary osteoarthritis of right knee 12/04/2017  . Primary osteoarthritis of both feet 12/04/2017  . History of hypothyroidism 12/04/2017  . Dyslipidemia 12/04/2017  . Status post total left knee replacement 10/14/2017  . Special screening for malignant neoplasms, colon   . Hemorrhoids 10/30/2015  . Fecal occult blood test positive 10/30/2015  . Postmenopausal 10/30/2015  . Large breasts 10/30/2015  . Bilateral shoulder pain 10/30/2015  . Back pain 10/30/2015  . Medial meniscus tear   . Lateral meniscus tear, current   . Erosive lichen planus of vulva 08/28/2014  . 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 Right hip flexor tightness 12/06/2013  . Plantar fasciitis 12/06/2013    Past Medical History:  Diagnosis Date  . Arthritis   . Back pain 10/30/2015  . Bilateral shoulder pain 10/30/2015  . COPD (chronic obstructive pulmonary disease) ([place])   . Fecal occult blood test positive 10/30/2015  . Hemorrhoids 10/30/2015  . Hypertension   . Hypothyroidism   . Large breasts 10/30/2015   43 DD  . Postmenopausal 10/30/2015  . Sleep apnea    pt DX but did not want to use CPAP.  Marland Kitchen Thyroid disease     Family History  Problem Relation Age of Onset  .  Alzheimer's disease Mother   . Cancer Father        brain tumor  . Diabetes Brother   . Heart disease Brother   . Heart attack Brother   . Cancer Brother        melanoma  . Lupus Maternal Grandmother   . Heart disease Maternal Grandmother   . Heart disease Maternal Grandfather   . Hypertension Maternal Grandfather   .  Breast cancer Maternal Aunt        unsure of age   . Breast cancer Cousin    Past Surgical History:  Procedure Laterality Date  . COLONOSCOPY N/A 11/12/2015   Procedure: COLONOSCOPY;  Surgeon: Danie Binder, MD;  Location: [facility];  Service: Endoscopy;  Laterality: N/A;  8:30 AM  . JOINT REPLACEMENT    . KNEE ARTHROSCOPY WITH MEDIAL MENISECTOMY Left 05/04/2015   Procedure: KNEE ARTHROSCOPY WITH MEDIAL AND LATERAL MENISECTOMY;  Surgeon: Carole Civil, MD;  Location: [facility];  Service: Orthopedics;  Laterality: Left;  . NASAL SINUS SURGERY    . TOTAL KNEE ARTHROPLASTY Left 01/29/2017   Procedure: TOTAL KNEE ARTHROPLASTY;  Surgeon: Carole Civil, MD;  Location: [facility];  Service: Orthopedics;  Laterality: Left;   Social History   Social History Narrative  . Not on file     Objective: Vital Signs: BP 135/86 (BP Location: Left Arm, Patient Position: Sitting, Cuff Size: Large)   Pulse 91   Resp 18   Ht '5\' 3"'  (1.6 m)   Wt 228 lb (103.4 kg)   LMP 04/14/2011   BMI 40.39 kg/m    Physical Exam  Constitutional: She is oriented to person, place, and time. She appears well-developed and well-nourished.  HENT:  Head: Normocephalic and atraumatic.  Eyes: Conjunctivae and EOM are normal.  Neck: Normal range of motion.  Cardiovascular: Normal rate, regular rhythm, normal heart sounds and intact distal pulses.  Pulmonary/Chest: Effort normal and breath sounds normal.  Abdominal: Soft. Bowel sounds are normal.  Musculoskeletal: She exhibits edema.  Lymphadenopathy:    She has no cervical adenopathy.  Neurological: She is alert and oriented to person, place, and time.  Skin: Skin is warm and dry. Capillary refill takes less than 2 seconds.  Psychiatric: She has a normal mood and affect. Her behavior is normal.  Nursing note and vitals reviewed.    Musculoskeletal Exam: C-spine thoracic lumbar spine good range of motion.  Shoulder joints, elbow joints, wrist joints were good  range of motion.  No synovitis was noted over MCPs.  She does have thickening of PIP/DIP joints bilaterally.  She has limited extension of her right knee.  She has left total knee replacement with some warmth.  Ankle joints are in good range of motion.  CDAI Exam: No CDAI exam completed.    Investigation: No additional findings. CBC Latest Ref Rng & Units 10/27/2017 10/14/2017 01/31/2017  WBC 4.0 - 10.5 K/uL 12.6(H) 8.6 15.7(H)  Hemoglobin 12.0 - 15.0 g/dL 12.9 13.9 12.5  Hematocrit 36.0 - 46.0 % 39.8 41.8 38.1  Platelets 150 - 400 K/uL 196 222 187   CMP     Component Value Date/Time   NA 137 10/27/2017 1544   K 2.8 (L) 10/27/2017 1544   CL 96 (L) 10/27/2017 1544   CO2 26 10/27/2017 1544   GLUCOSE 152 (H) 10/27/2017 1544   BUN 18 10/27/2017 1544   CREATININE 0.83 10/27/2017 1544   CREATININE 0.68 10/14/2017 1012   CALCIUM 8.8 (L) 10/27/2017 1544  PROT 7.0 10/27/2017 1544   ALBUMIN 3.6 10/27/2017 1544   AST 37 10/27/2017 1544   ALT 30 10/27/2017 1544   ALKPHOS 93 10/27/2017 1544   BILITOT 0.4 10/27/2017 1544   GFRNONAA >60 10/27/2017 1544   GFRNONAA 101 10/14/2017 1012   GFRAA >60 10/27/2017 1544   GFRAA 117 10/14/2017 1012   October 14, 2017 uric acid 5.7, AST 13, ESR 14, HLA-B27 negative, RF 17 which is mildly elevated. Imaging: No results found.  Speciality Comments: No specialty comments available.    Procedures:  No procedures performed Allergies: Penicillins   Assessment / Plan:     Visit Diagnoses: Primary osteoarthritis of both hands - RF17, ESR 14.  Labs were discussed at length.  She does not have any synovitis on examination.  Low titer RF which is not significant.  I did discuss with her in case she develops increased pain and swelling that we can schedule ultrasound of her bilateral hands to evaluate this further.  She does have osteoarthritis in her hands which causes significant stiffness.  A list of natural anti-inflammatories was discussed.  And muscle  strengthening exercises and joint protection was discussed.  Primary osteoarthritis of right knee - Moderate osteoarthritis and severe chondromalacia patella.  She had recent cortisone injection with an adequate response.  We discussed the option of Visco supplement injections.  She is interested in applying for that.  Status post total left knee replacement - 2018 by Dr. Aline Brochure.  She still has ongoing discomfort in her left knee.  She has problems with mobility.  Primary osteoarthritis of both feet: Proper fitting shoes were discussed.  She does have pedal edema which contributes to some of the discomfort in her feet.  Plantar fasciitis - History of recurrent plantar fasciitis.  Leg cramps: I advised her to try magnesium malate to 50 mg at bedtime.  Class III severe obesity: Weight loss diet and exercise was discussed at length.  History of hypothyroidism  Dyslipidemia    Orders: No orders of the defined types were placed in this encounter.  No orders of the defined types were placed in this encounter.   Face-to-face time spent with patient was 30 minutes.  Greater than 50% of time was spent in counseling and coordination of care.  Follow-Up Instructions: Return in about 6 months (around 06/12/2018) for Osteoarthritis.   Bo Merino, MD  Note - This record has been created using Editor, commissioning.  Chart creation errors have been sought, but may not always  have been located. Such creation errors do not reflect on  the standard of medical care.

## 2017-12-10 ENCOUNTER — Telehealth: Payer: Self-pay | Admitting: *Deleted

## 2017-12-10 ENCOUNTER — Encounter: Payer: Self-pay | Admitting: Rheumatology

## 2017-12-10 ENCOUNTER — Ambulatory Visit: Payer: BLUE CROSS/BLUE SHIELD | Admitting: Rheumatology

## 2017-12-10 VITALS — BP 135/86 | HR 91 | Resp 18 | Ht 63.0 in | Wt 228.0 lb

## 2017-12-10 DIAGNOSIS — M19071 Primary osteoarthritis, right ankle and foot: Secondary | ICD-10-CM | POA: Diagnosis not present

## 2017-12-10 DIAGNOSIS — Z96652 Presence of left artificial knee joint: Secondary | ICD-10-CM | POA: Diagnosis not present

## 2017-12-10 DIAGNOSIS — M19072 Primary osteoarthritis, left ankle and foot: Secondary | ICD-10-CM

## 2017-12-10 DIAGNOSIS — M1711 Unilateral primary osteoarthritis, right knee: Secondary | ICD-10-CM

## 2017-12-10 DIAGNOSIS — Z8639 Personal history of other endocrine, nutritional and metabolic disease: Secondary | ICD-10-CM | POA: Diagnosis not present

## 2017-12-10 DIAGNOSIS — E785 Hyperlipidemia, unspecified: Secondary | ICD-10-CM

## 2017-12-10 DIAGNOSIS — M722 Plantar fascial fibromatosis: Secondary | ICD-10-CM | POA: Diagnosis not present

## 2017-12-10 DIAGNOSIS — M19041 Primary osteoarthritis, right hand: Secondary | ICD-10-CM

## 2017-12-10 DIAGNOSIS — Z6841 Body Mass Index (BMI) 40.0 and over, adult: Secondary | ICD-10-CM

## 2017-12-10 DIAGNOSIS — M19042 Primary osteoarthritis, left hand: Secondary | ICD-10-CM

## 2017-12-10 NOTE — Telephone Encounter (Signed)
Apply for Euflexxa- 

## 2017-12-10 NOTE — Patient Instructions (Signed)

## 2017-12-24 ENCOUNTER — Telehealth: Payer: Self-pay | Admitting: *Deleted

## 2017-12-24 NOTE — Telephone Encounter (Signed)
Pending benefits, right knee only

## 2017-12-24 NOTE — Telephone Encounter (Signed)
I called patient, patients insurance does not cover any viscosupplementation.

## 2018-01-18 DIAGNOSIS — H35372 Puckering of macula, left eye: Secondary | ICD-10-CM | POA: Diagnosis not present

## 2018-02-08 ENCOUNTER — Ambulatory Visit: Payer: Self-pay | Admitting: Orthopedic Surgery

## 2018-02-09 DIAGNOSIS — D23121 Other benign neoplasm of skin of left upper eyelid, including canthus: Secondary | ICD-10-CM | POA: Diagnosis not present

## 2018-02-10 ENCOUNTER — Ambulatory Visit (INDEPENDENT_AMBULATORY_CARE_PROVIDER_SITE_OTHER): Payer: BLUE CROSS/BLUE SHIELD

## 2018-02-10 ENCOUNTER — Encounter: Payer: Self-pay | Admitting: Orthopedic Surgery

## 2018-02-10 ENCOUNTER — Ambulatory Visit (INDEPENDENT_AMBULATORY_CARE_PROVIDER_SITE_OTHER): Payer: BLUE CROSS/BLUE SHIELD | Admitting: Orthopedic Surgery

## 2018-02-10 VITALS — BP 137/90 | HR 78 | Ht 63.0 in | Wt 224.0 lb

## 2018-02-10 DIAGNOSIS — Z96652 Presence of left artificial knee joint: Secondary | ICD-10-CM

## 2018-02-10 NOTE — Progress Notes (Signed)
ANNUAL FOLLOW UP FOR left TKA   Chief Complaint  Patient presents with  . Follow-up    Annual TKA Left Knee     HPI: The patient is here for the annual  follow-up x-ray for knee replacement. The patient is not complaining of pain weakness instability or stiffness in the repaired knee.   Review of Systems  Musculoskeletal: Positive for joint pain.       Mild right knee pain    Past Medical History:  Diagnosis Date  . Arthritis   . Back pain 10/30/2015  . Bilateral shoulder pain 10/30/2015  . COPD (chronic obstructive pulmonary disease) ([facility])   . Fecal occult blood test positive 10/30/2015  . Hemorrhoids 10/30/2015  . Hypertension   . Hypothyroidism   . Large breasts 10/30/2015   43 DD  . Postmenopausal 10/30/2015  . Sleep apnea    pt DX but did not want to use CPAP.  Marland Kitchen Thyroid disease      Examination of the left KNEE  BP 137/90   Pulse 78   Ht  (1.6 m)   Wt 224 lb (101.6 kg)   LMP 04/14/2011   BMI 39.68 kg/m   General the patient is normally groomed in no distress  Mood normal Affect pleasant   The patient is Awake and alert ; oriented normal   Inspection shows : incision healed nicely without erythema, no tenderness no swelling  Range of motion total range of motion is 115 degrees  Stability the knee is stable anterior to posterior as well as medial to lateral  Strength quadriceps strength is normal  Skin no erythema around the skin incision  Cardiovascular NO EDEMA   Neuro: normal sensation in the operative leg   Gait: normal expected gait without cane    Medical decision-making section  X-rays ordered with the following personal interpretation  Normal alignment without loosening   Diagnosis  Encounter Diagnosis  Name Primary?  . Status post total left knee replacement 01/29/17 Yes     Plan follow-up 1 year repeat x-rays

## 2018-02-24 ENCOUNTER — Ambulatory Visit: Payer: Self-pay | Admitting: Orthopedic Surgery

## 2018-03-02 ENCOUNTER — Telehealth (INDEPENDENT_AMBULATORY_CARE_PROVIDER_SITE_OTHER): Payer: Self-pay

## 2018-03-02 NOTE — Telephone Encounter (Signed)
-----   Message from Jourdin Gens J Beavers sent at 03/02/2018  3:28 PM EDT ----- Regarding: FW: VISCO RIGHT DR. Corliss SkainsEVESHWAR I think this was meant  For you instead of PO Admin. Thanks! ----- Message ----- From: Audrie Liaaudle, Sharon H, RT Sent: 02/23/2018  11:57 AM To: [facility] Admin Subject: VISCO RIGHT DR. Corliss SkainsEVESHWAR                      Can you please apply for visco for right knee only for Dr. Corliss Skainseveshwar? I cannot get anywhere with this one. Thank you.

## 2018-03-02 NOTE — Telephone Encounter (Signed)
Do you want to apply for Euflexxa?  I see that patient has [employer] and Nancee LiterSynvsicOne is the preferred injection for [employer].  What would you like for me to do?

## 2018-03-03 NOTE — Telephone Encounter (Signed)
Submitted online for Synvisc x 3 injections right knee.

## 2018-03-03 NOTE — Telephone Encounter (Signed)
Anything except the "one" visco injections. Thank you.

## 2018-03-04 NOTE — Telephone Encounter (Signed)
Patient has [employer], and they do not cover any hyaluronic injections at all.  I have not called patient to advise, I will defer to you guys to call her.  Thanks!

## 2018-03-04 NOTE — Telephone Encounter (Signed)
I called patient to advise no visco covered under insurance.

## 2018-06-03 NOTE — Progress Notes (Signed)
Office Visit Note  Patient: [name]             Date of Birth: 06/23/1966           MRN: [id number]             PCP: Assunta FoundGolding, John, MD Referring: Assunta FoundGolding, John, MD Visit Date: 06/15/2018 Occupation: @GUAROCC @  Subjective:  Right knee pain  History of Present Illness: [name] is a 52 y.o. female with history of osteoarthritis.  Patient reports that she has been experiencing increased fatigue lately.  She states she continues to have some discomfort in her right knee joint.  Left total knee replacement is doing well.  She has some stiffness and pain in her hands.  She has not had any recent plantar fasciitis flare.  She uses her boot off and on.  Last plantar fasciitis flare was about a month ago.  Patient reports last Friday she was lifting a resident of the floor and hurt her right shoulder.  She was evaluated at an urgent care and was given a prednisone taper.  She is currently on prednisone and baclofen.  Activities of Daily Living:  Patient reports morning stiffness for 2 minutes.   Patient Denies nocturnal pain.  Difficulty dressing/grooming: Denies Difficulty climbing stairs: Reports Difficulty getting out of chair: Denies Difficulty using hands for taps, buttons, cutlery, and/or writing: Denies  Review of Systems  Constitutional: Positive for fatigue. Negative for night sweats, weight gain and weight loss.  HENT: Negative for mouth sores, trouble swallowing, trouble swallowing, mouth dryness and nose dryness.   Eyes: Negative for pain, redness, visual disturbance and dryness.  Respiratory: Negative for cough, shortness of breath and difficulty breathing.   Cardiovascular: Positive for hypertension. Negative for chest pain, palpitations, irregular heartbeat and swelling in legs/feet.  Gastrointestinal: Negative for blood in stool, constipation and diarrhea.  Endocrine: Negative for increased urination.  Genitourinary: Negative for vaginal dryness.  Musculoskeletal:  Positive for arthralgias, joint pain and morning stiffness. Negative for joint swelling, myalgias, muscle weakness, muscle tenderness and myalgias.  Skin: Negative for color change, rash, hair loss, skin tightness, ulcers and sensitivity to sunlight.  Allergic/Immunologic: Negative for susceptible to infections.  Neurological: Negative for dizziness, memory loss, night sweats and weakness.  Hematological: Negative for swollen glands.  Psychiatric/Behavioral: Positive for depressed mood and sleep disturbance. The patient is nervous/anxious.     PMFS History:  Patient Active Problem List   Diagnosis Date Noted  . Primary osteoarthritis of both hands 12/04/2017  . Primary osteoarthritis of right knee 12/04/2017  . Primary osteoarthritis of both feet 12/04/2017  . History of hypothyroidism 12/04/2017  . Dyslipidemia 12/04/2017  . Status post total left knee replacement 01/29/17 10/14/2017  . Special screening for malignant neoplasms, colon   . Hemorrhoids 10/30/2015  . Fecal occult blood test positive 10/30/2015  . Postmenopausal 10/30/2015  . Large breasts 10/30/2015  . Bilateral shoulder pain 10/30/2015  . Back pain 10/30/2015  . Medial meniscus tear   . Lateral meniscus tear, current   . Erosive lichen planus of vulva 08/28/2014  . 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 Right hip flexor tightness 12/06/2013  . Plantar fasciitis 12/06/2013    Past Medical History:  Diagnosis Date  . Arthritis   . Back pain 10/30/2015  . Bilateral shoulder pain 10/30/2015  . COPD (chronic obstructive pulmonary disease) (HCC)   . Fecal occult blood test positive 10/30/2015  . Hemorrhoids 10/30/2015  . Hypertension   . Hypothyroidism   .  Large breasts 10/30/2015   44 DD  . Postmenopausal 10/30/2015  . Sleep apnea    pt DX but did not want to use CPAP.  Marland Kitchen Thyroid disease     Family History  Problem Relation Age of Onset  . Alzheimer's disease Mother   . Cancer Father        brain tumor  . Diabetes Brother     . Heart disease Brother   . Heart attack Brother   . Cancer Brother        melanoma  . Lupus Maternal Grandmother   . Heart disease Maternal Grandmother   . Heart disease Maternal Grandfather   . Hypertension Maternal Grandfather   . Breast cancer Maternal Aunt        unsure of age   . Breast cancer Cousin    Past Surgical History:  Procedure Laterality Date  . COLONOSCOPY N/A 11/12/2015   Procedure: COLONOSCOPY;  Surgeon: West Bali, MD;  Location: [facility];  Service: Endoscopy;  Laterality: N/A;  8:30 AM  . JOINT REPLACEMENT    . KNEE ARTHROSCOPY WITH MEDIAL MENISECTOMY Left 05/04/2015   Procedure: KNEE ARTHROSCOPY WITH MEDIAL AND LATERAL MENISECTOMY;  Surgeon: Vickki Hearing, MD;  Location: [facility];  Service: Orthopedics;  Laterality: Left;  . NASAL SINUS SURGERY    . TOTAL KNEE ARTHROPLASTY Left 01/29/2017   Procedure: TOTAL KNEE ARTHROPLASTY;  Surgeon: Vickki Hearing, MD;  Location: [facility];  Service: Orthopedics;  Laterality: Left;   Social History   Social History Narrative  . Not on file    Objective: Vital Signs: BP 129/80 (BP Location: Left Arm, Patient Position: Sitting, Cuff Size: Large)   Pulse 76   Resp 15   Ht 5\' 3"  (1.6 m)   Wt 222 lb (100.7 kg)   LMP 04/14/2011   BMI 39.33 kg/m    Physical Exam  Constitutional: She is oriented to person, place, and time. She appears well-developed and well-nourished.  HENT:  Head: Normocephalic and atraumatic.  Eyes: Conjunctivae and EOM are normal.  Neck: Normal range of motion.  Cardiovascular: Normal rate, regular rhythm, normal heart sounds and intact distal pulses.  Pulmonary/Chest: Effort normal and breath sounds normal.  Abdominal: Soft. Bowel sounds are normal.  Lymphadenopathy:    She has no cervical adenopathy.  Neurological: She is alert and oriented to person, place, and time.  Skin: Skin is warm and dry. Capillary refill takes less than 2 seconds.  Psychiatric: She has a normal mood and  affect. Her behavior is normal.  Nursing note and vitals reviewed.    Musculoskeletal Exam: C-spine thoracic lumbar spine good range of motion.  Shoulder joints elbow joints wrist joint MCPs were in good range of motion.  She has DIP and PIP thickening in her hands and feet consistent with osteoarthritis.  She has been having some discomfort range of motion of her right knee joint with crepitus.  Left knee joint is replaced which is doing well.  She had no active plantar fasciitis today.  CDAI Exam: CDAI Score: Not documented Patient Global Assessment: Not documented; Provider Global Assessment: Not documented Swollen: Not documented; Tender: Not documented Joint Exam   Not documented   There is currently no information documented on the homunculus. Go to the Rheumatology activity and complete the homunculus joint exam.  Investigation: No additional findings.  Imaging: No results found.  Recent Labs: Lab Results  Component Value Date   WBC 12.6 (H) 10/27/2017   HGB  12.9 10/27/2017   PLT 196 10/27/2017   NA 137 10/27/2017   K 2.8 (L) 10/27/2017   CL 96 (L) 10/27/2017   CO2 26 10/27/2017   GLUCOSE 152 (H) 10/27/2017   BUN 18 10/27/2017   CREATININE 0.83 10/27/2017   BILITOT 0.4 10/27/2017   ALKPHOS 93 10/27/2017   AST 37 10/27/2017   ALT 30 10/27/2017   PROT 7.0 10/27/2017   ALBUMIN 3.6 10/27/2017   CALCIUM 8.8 (L) 10/27/2017   GFRAA >60 10/27/2017    Speciality Comments: No specialty comments available.  Procedures:  No procedures performed Allergies: Penicillins   Assessment / Plan:     Visit Diagnoses: Other fatigue -patient has been experiencing increased fatigue.  I will obtain following labs today.  Plan: CBC with Differential/Platelet, COMPLETE METABOLIC PANEL WITH GFR, CK, TSH, VITAMIN D 25 Hydroxy (Vit-D Deficiency, Fractures), Sedimentation rate, Serum protein electrophoresis with reflex, Vitamin B12  Primary osteoarthritis of both hands-joint protection  muscle strengthening was discussed.  Primary osteoarthritis of right knee - Moderate osteoarthritis and severe chondromalacia patella.  She continues to have some discomfort in her right knee joint.  Muscle strengthening exercise were discussed and handout was given.  Weight loss was discussed.  Status post total left knee replacement - 2018 by Dr. Romeo Apple.  Doing well.  Primary osteoarthritis of both feet-she is currently not having much discomfort in her feet.  Plantar fasciitis-she had a flare about a month ago which responded to stretching exercises.  Dyslipidemia  History of hypothyroidism  Class 3 severe obesity due to excess calories without serious comorbidity with body mass index (BMI) of 40.0 to 44.9 in adult (HCC) -weight loss diet and exercise was discussed.  Orders: Orders Placed This Encounter  Procedures  . CBC with Differential/Platelet  . COMPLETE METABOLIC PANEL WITH GFR  . CK  . TSH  . VITAMIN D 25 Hydroxy (Vit-D Deficiency, Fractures)  . Sedimentation rate  . Serum protein electrophoresis with reflex  . Vitamin B12   No orders of the defined types were placed in this encounter.   Face-to-face time spent with patient was 30 minutes. Greater than 50% of time was spent in counseling and coordination of care.  Follow-Up Instructions: Return in about 6 months (around 12/14/2018) for Osteoarthritis.   Pollyann Savoy, MD  Note - This record has been created using Animal nutritionist.  Chart creation errors have been sought, but may not always  have been located. Such creation errors do not reflect on  the standard of medical care.

## 2018-06-15 ENCOUNTER — Encounter: Payer: Self-pay | Admitting: Rheumatology

## 2018-06-15 ENCOUNTER — Ambulatory Visit: Payer: BLUE CROSS/BLUE SHIELD | Admitting: Rheumatology

## 2018-06-15 VITALS — BP 129/80 | HR 76 | Resp 15 | Ht 63.0 in | Wt 222.0 lb

## 2018-06-15 DIAGNOSIS — Z8639 Personal history of other endocrine, nutritional and metabolic disease: Secondary | ICD-10-CM

## 2018-06-15 DIAGNOSIS — M19041 Primary osteoarthritis, right hand: Secondary | ICD-10-CM

## 2018-06-15 DIAGNOSIS — M1711 Unilateral primary osteoarthritis, right knee: Secondary | ICD-10-CM | POA: Diagnosis not present

## 2018-06-15 DIAGNOSIS — R5383 Other fatigue: Secondary | ICD-10-CM

## 2018-06-15 DIAGNOSIS — Z96652 Presence of left artificial knee joint: Secondary | ICD-10-CM | POA: Diagnosis not present

## 2018-06-15 DIAGNOSIS — Z6841 Body Mass Index (BMI) 40.0 and over, adult: Secondary | ICD-10-CM

## 2018-06-15 DIAGNOSIS — E785 Hyperlipidemia, unspecified: Secondary | ICD-10-CM

## 2018-06-15 DIAGNOSIS — M19042 Primary osteoarthritis, left hand: Secondary | ICD-10-CM

## 2018-06-15 DIAGNOSIS — M19071 Primary osteoarthritis, right ankle and foot: Secondary | ICD-10-CM

## 2018-06-15 DIAGNOSIS — M19072 Primary osteoarthritis, left ankle and foot: Secondary | ICD-10-CM

## 2018-06-15 DIAGNOSIS — M722 Plantar fascial fibromatosis: Secondary | ICD-10-CM

## 2018-06-16 NOTE — Progress Notes (Signed)
Elevated  WBC and Glucose  due to Pred taper. Rest of the labs are stable

## 2018-06-17 LAB — CBC WITH DIFFERENTIAL/PLATELET
BASOS ABS: 29 {cells}/uL (ref 0–200)
BASOS PCT: 0.2 %
EOS ABS: 0 {cells}/uL — AB (ref 15–500)
Eosinophils Relative: 0 %
HCT: 39.4 % (ref 35.0–45.0)
HEMOGLOBIN: 13.5 g/dL (ref 11.7–15.5)
Lymphs Abs: 1882 cells/uL (ref 850–3900)
MCH: 28.7 pg (ref 27.0–33.0)
MCHC: 34.3 g/dL (ref 32.0–36.0)
MCV: 83.8 fL (ref 80.0–100.0)
MONOS PCT: 6.5 %
MPV: 11.7 fL (ref 7.5–12.5)
NEUTROS ABS: 11834 {cells}/uL — AB (ref 1500–7800)
Neutrophils Relative %: 80.5 %
Platelets: 281 10*3/uL (ref 140–400)
RBC: 4.7 10*6/uL (ref 3.80–5.10)
RDW: 14 % (ref 11.0–15.0)
Total Lymphocyte: 12.8 %
WBC: 14.7 10*3/uL — ABNORMAL HIGH (ref 3.8–10.8)
WBCMIX: 956 {cells}/uL — AB (ref 200–950)

## 2018-06-17 LAB — TSH: TSH: 0.6 m[IU]/L

## 2018-06-17 LAB — COMPLETE METABOLIC PANEL WITH GFR
AG Ratio: 1.3 (calc) (ref 1.0–2.5)
ALBUMIN MSPROF: 4 g/dL (ref 3.6–5.1)
ALKALINE PHOSPHATASE (APISO): 95 U/L (ref 33–130)
ALT: 14 U/L (ref 6–29)
AST: 13 U/L (ref 10–35)
BUN: 19 mg/dL (ref 7–25)
CO2: 33 mmol/L — AB (ref 20–32)
CREATININE: 0.72 mg/dL (ref 0.50–1.05)
Calcium: 9.4 mg/dL (ref 8.6–10.4)
Chloride: 96 mmol/L — ABNORMAL LOW (ref 98–110)
GFR, Est African American: 111 mL/min/{1.73_m2} (ref >=60)
GFR, Est Non African American: 96 mL/min/{1.73_m2} (ref >=60)
GLUCOSE: 130 mg/dL — AB (ref 65–99)
Globulin: 3 g/dL (calc) (ref 1.9–3.7)
Potassium: 3.9 mmol/L (ref 3.5–5.3)
Sodium: 137 mmol/L (ref 135–146)
Total Bilirubin: 0.3 mg/dL (ref 0.2–1.2)
Total Protein: 7 g/dL (ref 6.1–8.1)

## 2018-06-17 LAB — VITAMIN D 25 HYDROXY (VIT D DEFICIENCY, FRACTURES): VIT D 25 HYDROXY: 32 ng/mL (ref 30–100)

## 2018-06-17 LAB — VITAMIN B12: VITAMIN B 12: 294 pg/mL (ref 200–1100)

## 2018-06-17 LAB — PROTEIN ELECTROPHORESIS, SERUM, WITH REFLEX
Albumin ELP: 4 g/dL (ref 3.8–4.8)
Alpha 1: 0.3 g/dL (ref 0.2–0.3)
Alpha 2: 0.8 g/dL (ref 0.5–0.9)
Beta 2: 0.4 g/dL (ref 0.2–0.5)
Beta Globulin: 0.4 g/dL (ref 0.4–0.6)
GAMMA GLOBULIN: 1.3 g/dL (ref 0.8–1.7)
Total Protein: 7.1 g/dL (ref 6.1–8.1)

## 2018-06-17 LAB — SEDIMENTATION RATE: SED RATE: 17 mm/h (ref 0–30)

## 2018-06-17 LAB — CK: Total CK: 40 U/L (ref 29–143)

## 2018-09-13 DIAGNOSIS — J449 Chronic obstructive pulmonary disease, unspecified: Secondary | ICD-10-CM | POA: Diagnosis not present

## 2018-09-13 DIAGNOSIS — Z0000 Encounter for general adult medical examination without abnormal findings: Secondary | ICD-10-CM | POA: Diagnosis not present

## 2018-09-13 DIAGNOSIS — Z1389 Encounter for screening for other disorder: Secondary | ICD-10-CM | POA: Diagnosis not present

## 2018-09-13 DIAGNOSIS — E063 Autoimmune thyroiditis: Secondary | ICD-10-CM | POA: Diagnosis not present

## 2018-09-13 DIAGNOSIS — Z6839 Body mass index (BMI) 39.0-39.9, adult: Secondary | ICD-10-CM | POA: Diagnosis not present

## 2018-10-12 HISTORY — PX: SHOULDER SURGERY: SHX246

## 2018-10-12 HISTORY — PX: SHOULDER ARTHROSCOPY: SHX128

## 2018-11-30 NOTE — Progress Notes (Deleted)
Office Visit Note  Patient: [name]             Date of Birth: 1966-07-28           MRN: [id number]             PCP: Assunta Found, MD Referring: Assunta Found, MD Visit Date: 12/14/2018 Occupation: @GUAROCC @  Subjective:  No chief complaint on file.   History of Present Illness: [name] is a 52 y.o. female ***   Activities of Daily Living:  Patient reports morning stiffness for *** {minute/hour:19697}.   Patient {ACTIONS;DENIES/REPORTS:21021675::"Denies"} nocturnal pain.  Difficulty dressing/grooming: {ACTIONS;DENIES/REPORTS:21021675::"Denies"} Difficulty climbing stairs: {ACTIONS;DENIES/REPORTS:21021675::"Denies"} Difficulty getting out of chair: {ACTIONS;DENIES/REPORTS:21021675::"Denies"} Difficulty using hands for taps, buttons, cutlery, and/or writing: {ACTIONS;DENIES/REPORTS:21021675::"Denies"}  No Rheumatology ROS completed.   PMFS History:  Patient Active Problem List   Diagnosis Date Noted  . Primary osteoarthritis of both hands 12/04/2017  . Primary osteoarthritis of right knee 12/04/2017  . Primary osteoarthritis of both feet 12/04/2017  . History of hypothyroidism 12/04/2017  . Dyslipidemia 12/04/2017  . Status post total left knee replacement 01/29/17 10/14/2017  . Special screening for malignant neoplasms, colon   . Hemorrhoids 10/30/2015  . Fecal occult blood test positive 10/30/2015  . Postmenopausal 10/30/2015  . Large breasts 10/30/2015  . Bilateral shoulder pain 10/30/2015  . Back pain 10/30/2015  . Medial meniscus tear   . Lateral meniscus tear, current   . Erosive lichen planus of vulva 08/28/2014  . 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 Right hip flexor tightness 12/06/2013  . Plantar fasciitis 12/06/2013    Past Medical History:  Diagnosis Date  . Arthritis   . Back pain 10/30/2015  . Bilateral shoulder pain 10/30/2015  . COPD (chronic obstructive pulmonary disease) (HCC)   . Fecal occult blood test positive 10/30/2015  . Hemorrhoids  10/30/2015  . Hypertension   . Hypothyroidism   . Large breasts 10/30/2015   44 DD  . Postmenopausal 10/30/2015  . Sleep apnea    pt DX but did not want to use CPAP.  Marland Kitchen Thyroid disease     Family History  Problem Relation Age of Onset  . Alzheimer's disease Mother   . Cancer Father        brain tumor  . Diabetes Brother   . Heart disease Brother   . Heart attack Brother   . Cancer Brother        melanoma  . Lupus Maternal Grandmother   . Heart disease Maternal Grandmother   . Heart disease Maternal Grandfather   . Hypertension Maternal Grandfather   . Breast cancer Maternal Aunt        unsure of age   . Breast cancer Cousin    Past Surgical History:  Procedure Laterality Date  . COLONOSCOPY N/A 11/12/2015   Procedure: COLONOSCOPY;  Surgeon: West Bali, MD;  Location: [facility];  Service: Endoscopy;  Laterality: N/A;  8:30 AM  . JOINT REPLACEMENT    . KNEE ARTHROSCOPY WITH MEDIAL MENISECTOMY Left 05/04/2015   Procedure: KNEE ARTHROSCOPY WITH MEDIAL AND LATERAL MENISECTOMY;  Surgeon: Vickki Hearing, MD;  Location: [facility];  Service: Orthopedics;  Laterality: Left;  . NASAL SINUS SURGERY    . TOTAL KNEE ARTHROPLASTY Left 01/29/2017   Procedure: TOTAL KNEE ARTHROPLASTY;  Surgeon: Vickki Hearing, MD;  Location: [facility];  Service: Orthopedics;  Laterality: Left;   Social History   Social History Narrative  . Not on file    There  is no immunization history on file for this patient.   Objective: Vital Signs: LMP 04/14/2011    Physical Exam   Musculoskeletal Exam: ***  CDAI Exam: CDAI Score: Not documented Patient Global Assessment: Not documented; Provider Global Assessment: Not documented Swollen: Not documented; Tender: Not documented Joint Exam   Not documented   There is currently no information documented on the homunculus. Go to the Rheumatology activity and complete the homunculus joint exam.  Investigation: No additional findings.  Imaging: No  results found.  Recent Labs: Lab Results  Component Value Date   WBC 14.7 (H) 06/15/2018   HGB 13.5 06/15/2018   PLT 281 06/15/2018   NA 137 06/15/2018   K 3.9 06/15/2018   CL 96 (L) 06/15/2018   CO2 33 (H) 06/15/2018   GLUCOSE 130 (H) 06/15/2018   BUN 19 06/15/2018   CREATININE 0.72 06/15/2018   BILITOT 0.3 06/15/2018   ALKPHOS 93 10/27/2017   AST 13 06/15/2018   ALT 14 06/15/2018   PROT 7.0 06/15/2018   PROT 7.1 06/15/2018   ALBUMIN 3.6 10/27/2017   CALCIUM 9.4 06/15/2018   GFRAA 111 06/15/2018    Speciality Comments: No specialty comments available.  Procedures:  No procedures performed Allergies: Penicillins   Assessment / Plan:     Visit Diagnoses: No diagnosis found.   Orders: No orders of the defined types were placed in this encounter.  No orders of the defined types were placed in this encounter.   Face-to-face time spent with patient was *** minutes. Greater than 50% of time was spent in counseling and coordination of care.  Follow-Up Instructions: No follow-ups on file.   Ellen Henri, CMA  Note - This record has been created using Animal nutritionist.  Chart creation errors have been sought, but may not always  have been located. Such creation errors do not reflect on  the standard of medical care.

## 2018-12-07 ENCOUNTER — Encounter: Payer: Self-pay | Admitting: Adult Health

## 2018-12-07 ENCOUNTER — Ambulatory Visit (INDEPENDENT_AMBULATORY_CARE_PROVIDER_SITE_OTHER): Payer: BLUE CROSS/BLUE SHIELD | Admitting: Adult Health

## 2018-12-07 ENCOUNTER — Other Ambulatory Visit (HOSPITAL_COMMUNITY)
Admission: RE | Admit: 2018-12-07 | Discharge: 2018-12-07 | Disposition: A | Discharge status: Home / Self Care | Payer: BLUE CROSS/BLUE SHIELD | Source: Ambulatory Visit | Attending: Adult Health | Admitting: Adult Health

## 2018-12-07 ENCOUNTER — Other Ambulatory Visit: Payer: Self-pay

## 2018-12-07 VITALS — BP 141/79 | HR 79 | Ht 63.0 in | Wt 230.6 lb

## 2018-12-07 DIAGNOSIS — Z1211 Encounter for screening for malignant neoplasm of colon: Secondary | ICD-10-CM | POA: Diagnosis not present

## 2018-12-07 DIAGNOSIS — Z01419 Encounter for gynecological examination (general) (routine) without abnormal findings: Secondary | ICD-10-CM

## 2018-12-07 DIAGNOSIS — Z1212 Encounter for screening for malignant neoplasm of rectum: Secondary | ICD-10-CM

## 2018-12-07 DIAGNOSIS — L439 Lichen planus, unspecified: Secondary | ICD-10-CM | POA: Insufficient documentation

## 2018-12-07 LAB — HEMOCCULT GUIAC POC 1CARD (OFFICE): FECAL OCCULT BLD: NEGATIVE

## 2018-12-07 MED ORDER — TRIAMCINOLONE ACETONIDE 0.1 % EX CREA: 1.0000 "application " | TOPICAL_CREAM | Freq: Two times a day (BID) | CUTANEOUS | 2 refills | Status: DC

## 2018-12-07 NOTE — Progress Notes (Signed)
Patient ID: [name], female   DOB: December 21, 1965, 53 y.o.   MRN: [id number] History of Present Illness: [name] is a 53 year old white female, single G0P0 in for a well woman gyn exam and pap.  PCP is Dr Phillips Odor.    Current Medications, Allergies, Past Medical History, Past Surgical History, Family History and Social History were reviewed in [employer] record.     Review of Systems: Patient denies any headaches, hearing loss, fatigue, blurred vision, shortness of breath, chest pain, abdominal pain, problems with bowel movements, urination, or intercourse. No joint pain or mood swings. Has pain right shoulder at times, sp rotator cuff repair in January 2020, doing PT. She has had left knee replaced and looking to having right knee replaced.  Has itching right labia.   Physical Exam:BP (!) 141/79 (BP Location: Left Arm, Patient Position: Sitting, Cuff Size: Normal)   Pulse 79   Ht 5\' 3"  (1.6 m)   Wt 230 lb 9.6 oz (104.6 kg)   LMP 04/14/2011   BMI 40.85 kg/m  General:  Well developed, well nourished, no acute distress Skin:  Warm and dry Neck:  Midline trachea, normal thyroid, good ROM, no lymphadenopathy Lungs; Clear to auscultation bilaterally Breast:  No dominant palpable mass, retraction, or nipple discharge Cardiovascular: Regular rate and rhythm Abdomen:  Soft, non tender, no hepatosplenomegaly Pelvic:  External genitalia is normal in appearance, recurrent lichen planus right inner labia,  had biopsy in 2015.  The vagina is pale with loss of moisture and rugae. Urethra has no lesions or masses. The cervix is smooth, pap with HPV performed.  Uterus is felt to be normal size, shape, and contour.  No adnexal masses or tenderness noted.Bladder is non tender, no masses felt. Rectal: Good sphincter tone, no polyps, + hemorrhoids felt.  Hemoccult negative. Extremities/musculoskeletal:  No swelling or varicosities noted, no clubbing or cyanosis Psych:  No mood changes,  alert and cooperative,seems happy Fall risk is low. PHQ 2 score 0. Examination chaperoned by Marchelle Folks Rash LPN.    Impression: 1. Encounter for gynecological examination with Papanicolaou smear of cervix   2. Screening for colorectal cancer   3. Lichen planus       Plan: Meds ordered this encounter  Medications  . triamcinolone cream (KENALOG) 0.1 %    Sig: Apply 1 application topically 2 (two) times daily.    Dispense:  30 g    Refill:  2    Order Specific Question:   Supervising Provider    Answer:   Lazaro Arms [[id number]]  Physical in 1 year Pap in 3 if normal Get mammogram when shoulder healed Colonoscopy per GI Labs with PCP.

## 2018-12-09 LAB — CYTOLOGY - PAP
ADEQUACY: ABSENT
DIAGNOSIS: NEGATIVE
HPV: NOT DETECTED

## 2018-12-14 ENCOUNTER — Ambulatory Visit: Payer: BLUE CROSS/BLUE SHIELD | Admitting: Rheumatology

## 2018-12-17 ENCOUNTER — Ambulatory Visit: Payer: BLUE CROSS/BLUE SHIELD | Admitting: Physician Assistant

## 2019-02-07 ENCOUNTER — Other Ambulatory Visit: Payer: Self-pay | Admitting: Physician Assistant

## 2019-02-07 DIAGNOSIS — Z1231 Encounter for screening mammogram for malignant neoplasm of breast: Secondary | ICD-10-CM

## 2019-02-16 ENCOUNTER — Ambulatory Visit: Payer: BLUE CROSS/BLUE SHIELD | Admitting: Orthopedic Surgery

## 2019-02-16 ENCOUNTER — Encounter: Payer: Self-pay | Admitting: Orthopedic Surgery

## 2019-02-16 ENCOUNTER — Ambulatory Visit (INDEPENDENT_AMBULATORY_CARE_PROVIDER_SITE_OTHER): Payer: BLUE CROSS/BLUE SHIELD

## 2019-02-16 ENCOUNTER — Other Ambulatory Visit: Payer: Self-pay

## 2019-02-16 VITALS — BP 124/80 | HR 75 | Temp 97.4°F | Ht 63.0 in | Wt 236.0 lb

## 2019-02-16 DIAGNOSIS — Z96652 Presence of left artificial knee joint: Secondary | ICD-10-CM | POA: Diagnosis not present

## 2019-02-16 NOTE — Progress Notes (Signed)
ANNUAL FOLLOW UP FOR TKA   Chief Complaint  Patient presents with  . Routine Post Op    left knee replacement 01/29/17      HPI: The patient is here for the annual  follow-up x-ray for knee replacement. The patient is not complaining of pain weakness instability or stiffness in the repaired knee.   Review of Systems  Musculoskeletal:       S/p rcr dr [address] Filter  Pain right knee        Examination of the left KNEE  BP 124/80   Pulse 75   Ht 5\' 3"  (1.6 m)   Wt 236 lb (107 kg)   LMP 04/14/2011   BMI 41.81 kg/m   General the patient is normally groomed in no distress  Inspection shows : incision healed nicely without erythema, no tenderness no swelling  Range of motion total range of motion is 120  Stability the knee is stable anterior to posterior as well as medial to lateral  Strength quadriceps strength is normal  Skin no erythema around the skin incision  Neuro: normal sensation in the operative leg   Gait: normal expected gait without cane    Medical decision-making section  X-rays ordered, internal imaging shows (see full dictated report) stable implant with no signs of loosening  Diagnosis  Encounter Diagnosis  Name Primary?  . Status post total left knee replacement 01/29/17 Yes     Plan follow-up 1 year repeat x-rays

## 2019-03-29 ENCOUNTER — Other Ambulatory Visit: Payer: Self-pay

## 2019-03-29 ENCOUNTER — Ambulatory Visit
Admission: RE | Admit: 2019-03-29 | Discharge: 2019-03-29 | Disposition: A | Discharge status: Home / Self Care | Payer: BC Managed Care – PPO | Source: Ambulatory Visit | Attending: Physician Assistant | Admitting: Physician Assistant

## 2019-03-29 DIAGNOSIS — Z1231 Encounter for screening mammogram for malignant neoplasm of breast: Secondary | ICD-10-CM

## 2019-09-02 DIAGNOSIS — J329 Chronic sinusitis, unspecified: Secondary | ICD-10-CM | POA: Diagnosis not present

## 2019-09-02 DIAGNOSIS — Z681 Body mass index (BMI) 19 or less, adult: Secondary | ICD-10-CM | POA: Diagnosis not present

## 2019-09-02 DIAGNOSIS — E669 Obesity, unspecified: Secondary | ICD-10-CM | POA: Diagnosis not present

## 2019-09-02 DIAGNOSIS — I10 Essential (primary) hypertension: Secondary | ICD-10-CM | POA: Diagnosis not present

## 2019-10-11 DIAGNOSIS — Z0000 Encounter for general adult medical examination without abnormal findings: Secondary | ICD-10-CM | POA: Diagnosis not present

## 2019-10-11 DIAGNOSIS — Z6839 Body mass index (BMI) 39.0-39.9, adult: Secondary | ICD-10-CM | POA: Diagnosis not present

## 2019-10-11 DIAGNOSIS — M542 Cervicalgia: Secondary | ICD-10-CM | POA: Diagnosis not present

## 2019-10-11 DIAGNOSIS — E063 Autoimmune thyroiditis: Secondary | ICD-10-CM | POA: Diagnosis not present

## 2019-10-11 DIAGNOSIS — Z1389 Encounter for screening for other disorder: Secondary | ICD-10-CM | POA: Diagnosis not present

## 2019-10-11 DIAGNOSIS — Z0001 Encounter for general adult medical examination with abnormal findings: Secondary | ICD-10-CM | POA: Diagnosis not present

## 2019-10-11 DIAGNOSIS — N62 Hypertrophy of breast: Secondary | ICD-10-CM | POA: Diagnosis not present

## 2019-10-11 DIAGNOSIS — R7309 Other abnormal glucose: Secondary | ICD-10-CM | POA: Diagnosis not present

## 2019-10-11 DIAGNOSIS — Z20828 Contact with and (suspected) exposure to other viral communicable diseases: Secondary | ICD-10-CM | POA: Diagnosis not present

## 2019-10-18 DIAGNOSIS — Z20828 Contact with and (suspected) exposure to other viral communicable diseases: Secondary | ICD-10-CM | POA: Diagnosis not present

## 2019-10-25 DIAGNOSIS — Z20828 Contact with and (suspected) exposure to other viral communicable diseases: Secondary | ICD-10-CM | POA: Diagnosis not present

## 2019-11-08 ENCOUNTER — Encounter: Payer: Self-pay | Admitting: Orthopaedic Surgery

## 2019-11-08 ENCOUNTER — Other Ambulatory Visit: Payer: Self-pay

## 2019-11-08 ENCOUNTER — Ambulatory Visit: Payer: BC Managed Care – PPO

## 2019-11-08 ENCOUNTER — Ambulatory Visit (INDEPENDENT_AMBULATORY_CARE_PROVIDER_SITE_OTHER): Payer: BC Managed Care – PPO | Admitting: Orthopaedic Surgery

## 2019-11-08 VITALS — BP 147/102 | HR 79 | Temp 97.2°F | Ht 63.0 in | Wt 227.1 lb

## 2019-11-08 DIAGNOSIS — Z96652 Presence of left artificial knee joint: Secondary | ICD-10-CM

## 2019-11-08 DIAGNOSIS — M25561 Pain in right knee: Secondary | ICD-10-CM | POA: Diagnosis not present

## 2019-11-08 DIAGNOSIS — G8929 Other chronic pain: Secondary | ICD-10-CM | POA: Diagnosis not present

## 2019-11-08 MED ORDER — HYDROCODONE-ACETAMINOPHEN 5-325 MG PO TABS: ORAL_TABLET | ORAL | 0 refills | Status: DC
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
## 2019-11-18 ENCOUNTER — Ambulatory Visit: Payer: BC Managed Care – PPO | Admitting: Orthopedic Surgery

## 2019-11-21 ENCOUNTER — Other Ambulatory Visit: Payer: Self-pay

## 2019-11-21 ENCOUNTER — Ambulatory Visit (INDEPENDENT_AMBULATORY_CARE_PROVIDER_SITE_OTHER): Payer: BC Managed Care – PPO | Admitting: Orthopedic Surgery

## 2019-11-21 VITALS — BP 121/75 | HR 77 | Temp 97.5°F | Ht 63.0 in | Wt 227.0 lb

## 2019-11-21 DIAGNOSIS — M25561 Pain in right knee: Secondary | ICD-10-CM | POA: Diagnosis not present

## 2019-11-21 DIAGNOSIS — M1710 Unilateral primary osteoarthritis, unspecified knee: Secondary | ICD-10-CM

## 2019-11-21 NOTE — Progress Notes (Signed)
Chief Complaint  Patient presents with  . Follow-up    Recheck on right knee.    54 year old female status post left total knee fell injured right knee x-rays were negative patient complains of mild pain in the popliteal fossa has underlying arthritis acute symptoms seem to have resolved  Examination shows that she has a stable knee has some fullness and pain in the popliteal fossa no tenderness anteriorly  Impression acute pain resolved chronic osteoarthritis   Encounter Diagnoses  Name Primary?  . Acute pain of right knee Yes  . Primary localized osteoarthritis of knee

## 2019-12-29 ENCOUNTER — Encounter: Payer: Self-pay | Admitting: Plastic Surgery

## 2019-12-29 ENCOUNTER — Other Ambulatory Visit: Payer: Self-pay

## 2019-12-29 ENCOUNTER — Ambulatory Visit: Payer: BC Managed Care – PPO | Admitting: Plastic Surgery

## 2019-12-29 VITALS — BP 142/82 | HR 76 | Temp 98.0°F | Ht 62.0 in | Wt 231.2 lb

## 2019-12-29 DIAGNOSIS — N62 Hypertrophy of breast: Secondary | ICD-10-CM | POA: Diagnosis not present

## 2019-12-29 NOTE — Progress Notes (Signed)
Referring Provider Sharilyn Sites, MD [address] [place],  [place] [address]   CC:  Chief Complaint  Patient presents with  . Advice Only    for (B) breast reduction      [name] is an 54 y.o. female.  HPI: Patient presents to discuss breast reduction.  She is currently a 44 triple D and wants to be around a B cup.  She has had years of back pain neck pain and shoulder grooving.  She also reports rashes beneath her breasts that were treated with nystatin creams with inconsistent results.  She is not a smoker and is nondiabetic.  She reports 1 previous breast needle biopsy that was benign.    Allergies  Allergen Reactions  . Penicillins Other (See Comments)    Yeast infection Has patient had a PCN reaction causing immediate rash, facial/tongue/throat swelling, SOB or lightheadedness with hypotension: No Has patient had a PCN reaction causing severe rash involving mucus membranes or skin necrosis: No Has patient had a PCN reaction that required hospitalization No Has patient had a PCN reaction occurring within the last 10 years: No If all of the above answers are "NO", then may proceed with Cephalosporin use.     Outpatient Encounter Medications as of 12/29/2019  Medication Sig  . Acetaminophen (TYLENOL PO) Take by mouth as needed.   . budesonide-formoterol (SYMBICORT) 80-4.5 MCG/ACT inhaler Inhale 2 puffs into the lungs 2 (two) times daily.  . Cholecalciferol (VITAMIN D PO) Take by mouth daily.  . clobetasol cream (TEMOVATE) 1.91 % Apply 1 application topically 2 (two) times daily as needed.   . diclofenac (VOLTAREN) 75 MG EC tablet Take 75 mg by mouth 2 (two) times daily.  . hydrochlorothiazide (HYDRODIURIL) 25 MG tablet Take 25 mg by mouth daily.  Marland Kitchen levothyroxine (SYNTHROID, LEVOTHROID) 25 MCG tablet Take 25 mcg by mouth daily before breakfast.  . MAGNESIUM PO Take by mouth daily.  . Omega-3 Fatty Acids (FISH OIL PO) Take by mouth daily.  . Potassium Bicarb  & Chloride (POT BICARB-POT CHLORIDE PO) Take 250 mg by mouth.  . potassium chloride SA (K-DUR,KLOR-CON) 20 MEQ tablet Take 2 tablets (40 mEq total) by mouth 3 (three) times daily.  . pravastatin (PRAVACHOL) 20 MG tablet Take 20 mg by mouth every evening.   . predniSONE (DELTASONE) 20 MG tablet Take 20 mg by mouth 3 (three) times daily.  Marland Kitchen triamcinolone cream (KENALOG) 0.1 % Apply 1 application topically 2 (two) times daily.  . TURMERIC PO Take 1 tablet by mouth daily.  Marland Kitchen HYDROcodone-acetaminophen (NORCO/VICODIN) 5-325 MG tablet One tablet every four hours for pain. (Patient not taking: Reported on 12/29/2019)   No facility-administered encounter medications on file as of 12/29/2019.     Past Medical History:  Diagnosis Date  . Arthritis   . Back pain 10/30/2015  . Bilateral shoulder pain 10/30/2015  . COPD (chronic obstructive pulmonary disease) ([place])   . Fecal occult blood test positive 10/30/2015  . Hemorrhoids 10/30/2015  . Hypertension   . Hypothyroidism   . Large breasts 10/30/2015   44 DD  . Postmenopausal 10/30/2015  . Sleep apnea    pt DX but did not want to use CPAP.  Marland Kitchen Thyroid disease     Past Surgical History:  Procedure Laterality Date  . COLONOSCOPY N/A 11/12/2015   Procedure: COLONOSCOPY;  Surgeon: Danie Binder, MD;  [facility];  Service: Endoscopy;  Laterality: N/A;  8:30 AM  . JOINT REPLACEMENT    .  KNEE ARTHROSCOPY WITH MEDIAL MENISECTOMY Left 05/04/2015   Procedure: KNEE ARTHROSCOPY WITH MEDIAL AND LATERAL MENISECTOMY;  Surgeon: Vickki Hearing, MD;  Location: [facility];  Service: Orthopedics;  Laterality: Left;  . NASAL SINUS SURGERY    . ROTATOR CUFF REPAIR    . SHOULDER ARTHROSCOPY Right 10/12/2018   right rotator cuff repair   . SHOULDER SURGERY Right 10/12/2018   rotator cuff repair   . TOTAL KNEE ARTHROPLASTY Left 01/29/2017   Procedure: TOTAL KNEE ARTHROPLASTY;  Surgeon: Vickki Hearing, MD;  Location: [facility];  Service: Orthopedics;  Laterality:  Left;    Family History  Problem Relation Age of Onset  . Alzheimer's disease Mother   . Cancer Father        brain tumor  . Diabetes Brother   . Heart disease Brother   . Heart attack Brother   . Cancer Brother        melanoma  . Lupus Maternal Grandmother   . Heart disease Maternal Grandmother   . Heart disease Maternal Grandfather   . Hypertension Maternal Grandfather   . Breast cancer Maternal Aunt        unsure of age   . Breast cancer Cousin     Social History   Social History Narrative  . Not on file     Review of Systems General: Denies fevers, chills, weight loss CV: Denies chest pain, shortness of breath, palpitations  Physical Exam Vitals with BMI 12/29/2019 11/21/2019 11/08/2019  Height 5\' 2"  5\' 3"  5\' 3"   Weight 231 lbs 3 oz 227 lbs 227 lbs 2 oz  BMI 42.28 40.22 40.24  Systolic 142 121  Diastolic 82 75 102  Pulse 76 77 79    General:  No acute distress,  Alert and oriented, Non-Toxic, Normal speech and affect Breast: She has grade 3 ptosis.  Sternal notch to nipple distance is 37 bilaterally.  Nipple to fold distance is 19 bilaterally.  I do not see any obvious scars or masses.  Assessment/Plan The patient has bilateral symptomatic macromastia.  She is a good candidate for a breast reduction.  The details of breast reduction surgery were discussed.  I explained the procedure in detail along the with the expected scars.  The risks were discussed in detail and include bleeding, infection, damage to surrounding structures, need for additional procedures, nipple loss, change in nipple sensation, persistent pain, contour irregularities and asymmetries.  I explained that breast feeding is often not possible after breast reduction surgery.  We discussed the expected postoperative course with an overall recovery period of about 1 month.  She demonstrated full understanding of all risks.  We discussed her personal risk factors that include her BMI.  I discussed both  pedicle and free nipple graft techniques with the patient and she is going to think about it.  We discussed the pros and cons of each.  I discussed that free nipple graft will allow me to make her smaller but we will leave the nipple with a different color and changed sensation.  The pedicle breast reduction would limit how small she could be to some degree.  I anticipate approximately 900 g of tissue removed from each side.   12/29/2019, 4:43 PM

## 2020-01-27 ENCOUNTER — Encounter: Payer: Self-pay | Admitting: Orthopedic Surgery

## 2020-02-14 DIAGNOSIS — R5383 Other fatigue: Secondary | ICD-10-CM | POA: Diagnosis not present

## 2020-02-14 DIAGNOSIS — E063 Autoimmune thyroiditis: Secondary | ICD-10-CM | POA: Diagnosis not present

## 2020-02-14 DIAGNOSIS — R7309 Other abnormal glucose: Secondary | ICD-10-CM | POA: Diagnosis not present

## 2020-02-14 DIAGNOSIS — G4730 Sleep apnea, unspecified: Secondary | ICD-10-CM | POA: Diagnosis not present

## 2020-02-14 DIAGNOSIS — Z6839 Body mass index (BMI) 39.0-39.9, adult: Secondary | ICD-10-CM | POA: Diagnosis not present

## 2020-02-15 ENCOUNTER — Ambulatory Visit: Payer: BLUE CROSS/BLUE SHIELD | Admitting: Orthopedic Surgery

## 2020-02-23 ENCOUNTER — Other Ambulatory Visit: Payer: Self-pay

## 2020-02-23 ENCOUNTER — Encounter: Payer: Self-pay | Admitting: Orthopedic Surgery

## 2020-02-23 ENCOUNTER — Ambulatory Visit: Payer: BC Managed Care – PPO | Admitting: Orthopedic Surgery

## 2020-02-23 ENCOUNTER — Ambulatory Visit: Payer: BC Managed Care – PPO

## 2020-02-23 VITALS — BP 140/101 | HR 82 | Ht 63.0 in | Wt 232.0 lb

## 2020-02-23 DIAGNOSIS — M1710 Unilateral primary osteoarthritis, unspecified knee: Secondary | ICD-10-CM

## 2020-02-23 DIAGNOSIS — Z96652 Presence of left artificial knee joint: Secondary | ICD-10-CM

## 2020-02-23 DIAGNOSIS — M1712 Unilateral primary osteoarthritis, left knee: Secondary | ICD-10-CM

## 2020-02-23 NOTE — Progress Notes (Signed)
Chief Complaint  Patient presents with  . Knee Pain    L/swollen and hurts/DOS  510/18   3 yrs postop [employer] fixed-bearing posterior stabilized total knee complains of swelling.  Swelling is over the bursa no swelling of the joint  She has excellent range of motion full extension 120 degrees of flexion stable in all planes  Her x-ray looks normal  Follow-up in a year

## 2020-04-02 ENCOUNTER — Encounter: Payer: BC Managed Care – PPO | Admitting: Surgical

## 2020-04-03 NOTE — H&P (View-Only) (Signed)
Patient ID: [name], female    DOB: 05-03-1966, 54 y.o.   MRN: [id number]  Chief Complaint  Patient presents with  . Pre-op Exam      ICD-10-CM   1. Macromastia  N62     History of Present Illness: [name] is a 54 y.o.  female  with a history of macromastia causing years of back pain, neck pain, shoulder grooving and rashes beneath her breast.  She presents for preoperative evaluation for upcoming procedure, bilateral breast reduction, scheduled for 04/17/2020 with Dr. Arita Miss  The patient has not had problems with anesthesia. No history of DVT/PE.  No family history of DVT/PE.  No family or personal history of bleeding or clotting disorders.  Patient is not currently taking any blood thinners.  No history of CVA/MI.   Summary of Previous Visit: Patient is currently a 67 triple D and would like to be around a B cup.  She is not a smoker or diabetic.  Had one previous breast biopsy that was benign.  Anticipate approximate 900 g of tissue removed from each side.  Patient's last mammogram 03/2019, negative for malignancy.   Job: CNA at [employer].   PMH Significant for: Arthritis, COPD, hypertension, sleep apnea. Patient does not use CPAP. She reports she is able to walk a few street blocks without SOB/CP. Past surgical history of knee arthroscopy, rotator cuff repair, shoulder arthroscopy, total knee arthroplasty (left)   Past Medical History: Allergies: Allergies  Allergen Reactions  . Penicillins Other (See Comments)    Yeast infection Has patient had a PCN reaction causing immediate rash, facial/tongue/throat swelling, SOB or lightheadedness with hypotension: No Has patient had a PCN reaction causing severe rash involving mucus membranes or skin necrosis: No Has patient had a PCN reaction that required hospitalization No Has patient had a PCN reaction occurring within the last 10 years: No If all of the above answers are "NO", then may proceed with  Cephalosporin use.     Current Medications:  Current Outpatient Medications:  .  Acetaminophen (TYLENOL PO), Take by mouth as needed. , Disp: , Rfl:  .  budesonide-formoterol (SYMBICORT) 80-4.5 MCG/ACT inhaler, Inhale 2 puffs into the lungs 2 (two) times daily., Disp: , Rfl:  .  buPROPion (WELLBUTRIN XL) 150 MG 24 hr tablet, Take 150 mg by mouth daily., Disp: , Rfl:  .  Cholecalciferol (VITAMIN D PO), Take by mouth daily., Disp: , Rfl:  .  clobetasol cream (TEMOVATE) 0.05 %, Apply 1 application topically 2 (two) times daily as needed. , Disp: , Rfl:  .  diclofenac (VOLTAREN) 75 MG EC tablet, Take 75 mg by mouth 2 (two) times daily., Disp: , Rfl:  .  levothyroxine (SYNTHROID, LEVOTHROID) 25 MCG tablet, Take 25 mcg by mouth daily before breakfast., Disp: , Rfl:  .  MAGNESIUM PO, Take by mouth daily., Disp: , Rfl:  .  Omega-3 Fatty Acids (FISH OIL PO), Take by mouth daily., Disp: , Rfl:  .  Potassium Bicarb & Chloride (POT BICARB-POT CHLORIDE PO), Take 250 mg by mouth., Disp: , Rfl:  .  potassium chloride SA (K-DUR,KLOR-CON) 20 MEQ tablet, Take 2 tablets (40 mEq total) by mouth 3 (three) times daily., Disp: 10 tablet, Rfl: 0 .  pravastatin (PRAVACHOL) 20 MG tablet, Take 20 mg by mouth every evening. , Disp: , Rfl:  .  predniSONE (DELTASONE) 20 MG tablet, Take 20 mg by mouth 3 (three) times daily., Disp: , Rfl: 0 .  TURMERIC PO, Take 1 tablet by mouth daily., Disp: , Rfl:  .  hydrochlorothiazide (HYDRODIURIL) 25 MG tablet, Take 25 mg by mouth daily. (Patient not taking: Reported on 04/04/2020), Disp: , Rfl:  .  HYDROcodone-acetaminophen (NORCO) 5-325 MG tablet, Take 1 tablet by mouth every 6 (six) hours as needed for up to 5 days for severe pain., Disp: 20 tablet, Rfl: 0 .  ondansetron (ZOFRAN) 4 MG tablet, Take 1 tablet (4 mg total) by mouth every 8 (eight) hours as needed for nausea or vomiting., Disp: 20 tablet, Rfl: 0 .  triamcinolone cream (KENALOG) 0.1 %, Apply 1 application topically 2  (two) times daily. (Patient not taking: Reported on 02/23/2020), Disp: 30 g, Rfl: 2  Past Medical Problems: Past Medical History:  Diagnosis Date  . Arthritis   . Back pain 10/30/2015  . Bilateral shoulder pain 10/30/2015  . COPD (chronic obstructive pulmonary disease) (HCC)   . Fecal occult blood test positive 10/30/2015  . Hemorrhoids 10/30/2015  . Hypertension   . Hypothyroidism   . Large breasts 10/30/2015   44 DD  . Postmenopausal 10/30/2015  . Sleep apnea    pt DX but did not want to use CPAP.  Marland Kitchen Thyroid disease     Past Surgical History: Past Surgical History:  Procedure Laterality Date  . COLONOSCOPY N/A 11/12/2015   Procedure: COLONOSCOPY;  Surgeon: West Bali, MD;  Location: [facility];  Service: Endoscopy;  Laterality: N/A;  8:30 AM  . JOINT REPLACEMENT    . KNEE ARTHROSCOPY WITH MEDIAL MENISECTOMY Left 05/04/2015   Procedure: KNEE ARTHROSCOPY WITH MEDIAL AND LATERAL MENISECTOMY;  Surgeon: Vickki Hearing, MD;  Location: [facility];  Service: Orthopedics;  Laterality: Left;  . NASAL SINUS SURGERY    . ROTATOR CUFF REPAIR    . SHOULDER ARTHROSCOPY Right 10/12/2018   right rotator cuff repair   . SHOULDER SURGERY Right 10/12/2018   rotator cuff repair   . TOTAL KNEE ARTHROPLASTY Left 01/29/2017   Procedure: TOTAL KNEE ARTHROPLASTY;  Surgeon: Vickki Hearing, MD;  Location: [facility];  Service: Orthopedics;  Laterality: Left;    Social History: Social History   Socioeconomic History  . Marital status: Single    Spouse name: Not on file  . Number of children: Not on file  . Years of education: Not on file  . Highest education level: Not on file  Occupational History  . Not on file  Tobacco Use  . Smoking status: Never Smoker  . Smokeless tobacco: Never Used  Vaping Use  . Vaping Use: Never used  Substance and Sexual Activity  . Alcohol use: No  . Drug use: No  . Sexual activity: Yes    Birth control/protection: Post-menopausal, None  Other Topics Concern  .  Not on file  Social History Narrative  . Not on file   Social Determinants of Health   Financial Resource Strain:   . Difficulty of Paying Living Expenses:   Food Insecurity:   . Worried About Programme researcher, broadcasting/film/video in the Last Year:   . Barista in the Last Year:   Transportation Needs:   . Freight forwarder (Medical):   Marland Kitchen Lack of Transportation (Non-Medical):   Physical Activity:   . Days of Exercise per Week:   . Minutes of Exercise per Session:   Stress:   . Feeling of Stress :   Social Connections:   . Frequency of Communication with Friends and Family:   .  Frequency of Social Gatherings with Friends and Family:   . Attends Religious Services:   . Active Member of Clubs or Organizations:   . Attends BankerClub or Organization Meetings:   Marland Kitchen. Marital Status:   Intimate Partner Violence:   . Fear of Current or Ex-Partner:   . Emotionally Abused:   Marland Kitchen. Physically Abused:   . Sexually Abused:     Family History: Family History  Problem Relation Age of Onset  . Alzheimer's disease Mother   . Cancer Father        brain tumor  . Diabetes Brother   . Heart disease Brother   . Heart attack Brother   . Cancer Brother        melanoma  . Lupus Maternal Grandmother   . Heart disease Maternal Grandmother   . Heart disease Maternal Grandfather   . Hypertension Maternal Grandfather   . Breast cancer Maternal Aunt        unsure of age   . Breast cancer Cousin     Review of Systems: Review of Systems  Constitutional: Negative.   Respiratory: Negative for cough.   Cardiovascular: Negative.   Gastrointestinal: Negative.   Genitourinary: Negative.   Musculoskeletal: Positive for joint pain.    Physical Exam: Vital Signs BP (!) 143/84 (BP Location: Left Arm, Patient Position: Sitting, Cuff Size: Large)   Pulse 71   Temp 97.9 F (36.6 C) (Oral)   Ht 5\' 3"  (1.6 m)   Wt 227 lb 6.4 oz (103.1 kg)   LMP 04/14/2011   SpO2 98%   BMI 40.28 kg/m  Physical  Exam Constitutional:      General: She is not in acute distress.    Appearance: Normal appearance. She is not ill-appearing.  HENT:     Head: Normocephalic and atraumatic.  Eyes:     Pupils: Pupils are equal, round Neck:     Musculoskeletal: Normal range of motion.  Cardiovascular:     Rate and Rhythm: Normal rate and regular rhythm.     Pulses: Normal pulses.     Heart sounds: Normal heart sounds. No murmur.  Pulmonary:     Effort: Pulmonary effort is normal. No respiratory distress.     Breath sounds: Normal breath sounds. No wheezing.  Musculoskeletal: Normal range of motion.  Skin:    General: Skin is warm and dry.     Findings: No erythema or rash.  Neurological:     General: No focal deficit present.     Mental Status: She is alert and oriented to person, place, and time. Mental status is at baseline.     Motor: No weakness.  Psychiatric:        Mood and Affect: Mood normal.        Behavior: Behavior normal.     Assessment/Plan: [name] is scheduled for bilateral breast reduction with Dr. Arita MissPace on 7.27.21.  Risks, benefits, and alternatives of procedure discussed, questions answered and consent obtained.    Had a thorough discussion with patient about the different techniques possible for bilateral breast reduction.  She had previously discussed with Dr. Arita MissPace amputation technique with skin grafted nipple versus pedicle technique.  Patient has decided that she would like to move forward with a pedicle technique.  We discussed the risk associated with pedicle technique and nipple necrosis due to the large breast size.  She is aware and in agreement with the plan.  She would like to be a C cup.  Smoking Status: non smoker;  Counseling Given? N/A Last Mammogram: Screening mammogram on 03/29/2019; Results: No mammographic evidence of malignancy.  Caprini Score: 6, high; Risk Factors include: age, BMI greater than 25, COPD, Varicose veins and length of planned surgery.  Recommendation for mechanical and pharmacological prophylaxis during surgery. Encourage early ambulation.   PCP surgical clearance form sent to Assunta Found, MD in [place].  Pictures obtained: 12/29/19  Post-op Rx sent to pharmacy: zofran, norco   Patient was provided with the breast reduction and General Surgical Risk consent document and Pain Medication Agreement prior to their appointment.  They had adequate time to read through the risk consent documents and Pain Medication Agreement. We also discussed them in person together during this preop appointment. All of their questions were answered to their satisfaction.  Recommended calling if they have any further questions.  Risk consent form and Pain Medication Agreement to be scanned into patient's chart.   Electronically signed by: Kermit Balo Lennard Capek, PA-C 04/04/2020 9:39 AM

## 2020-04-03 NOTE — Progress Notes (Signed)
Patient ID: [name], female    DOB: 05-03-1966, 54 y.o.   MRN: [id number]  Chief Complaint  Patient presents with  . Pre-op Exam      ICD-10-CM   1. Macromastia  N62     History of Present Illness: [name] is a 54 y.o.  female  with a history of macromastia causing years of back pain, neck pain, shoulder grooving and rashes beneath her breast.  She presents for preoperative evaluation for upcoming procedure, bilateral breast reduction, scheduled for 04/17/2020 with Dr. Arita Miss  The patient has not had problems with anesthesia. No history of DVT/PE.  No family history of DVT/PE.  No family or personal history of bleeding or clotting disorders.  Patient is not currently taking any blood thinners.  No history of CVA/MI.   Summary of Previous Visit: Patient is currently a 67 triple D and would like to be around a B cup.  She is not a smoker or diabetic.  Had one previous breast biopsy that was benign.  Anticipate approximate 900 g of tissue removed from each side.  Patient's last mammogram 03/2019, negative for malignancy.   Job: CNA at [employer].   PMH Significant for: Arthritis, COPD, hypertension, sleep apnea. Patient does not use CPAP. She reports she is able to walk a few street blocks without SOB/CP. Past surgical history of knee arthroscopy, rotator cuff repair, shoulder arthroscopy, total knee arthroplasty (left)   Past Medical History: Allergies: Allergies  Allergen Reactions  . Penicillins Other (See Comments)    Yeast infection Has patient had a PCN reaction causing immediate rash, facial/tongue/throat swelling, SOB or lightheadedness with hypotension: No Has patient had a PCN reaction causing severe rash involving mucus membranes or skin necrosis: No Has patient had a PCN reaction that required hospitalization No Has patient had a PCN reaction occurring within the last 10 years: No If all of the above answers are "NO", then may proceed with  Cephalosporin use.     Current Medications:  Current Outpatient Medications:  .  Acetaminophen (TYLENOL PO), Take by mouth as needed. , Disp: , Rfl:  .  budesonide-formoterol (SYMBICORT) 80-4.5 MCG/ACT inhaler, Inhale 2 puffs into the lungs 2 (two) times daily., Disp: , Rfl:  .  buPROPion (WELLBUTRIN XL) 150 MG 24 hr tablet, Take 150 mg by mouth daily., Disp: , Rfl:  .  Cholecalciferol (VITAMIN D PO), Take by mouth daily., Disp: , Rfl:  .  clobetasol cream (TEMOVATE) 0.05 %, Apply 1 application topically 2 (two) times daily as needed. , Disp: , Rfl:  .  diclofenac (VOLTAREN) 75 MG EC tablet, Take 75 mg by mouth 2 (two) times daily., Disp: , Rfl:  .  levothyroxine (SYNTHROID, LEVOTHROID) 25 MCG tablet, Take 25 mcg by mouth daily before breakfast., Disp: , Rfl:  .  MAGNESIUM PO, Take by mouth daily., Disp: , Rfl:  .  Omega-3 Fatty Acids (FISH OIL PO), Take by mouth daily., Disp: , Rfl:  .  Potassium Bicarb & Chloride (POT BICARB-POT CHLORIDE PO), Take 250 mg by mouth., Disp: , Rfl:  .  potassium chloride SA (K-DUR,KLOR-CON) 20 MEQ tablet, Take 2 tablets (40 mEq total) by mouth 3 (three) times daily., Disp: 10 tablet, Rfl: 0 .  pravastatin (PRAVACHOL) 20 MG tablet, Take 20 mg by mouth every evening. , Disp: , Rfl:  .  predniSONE (DELTASONE) 20 MG tablet, Take 20 mg by mouth 3 (three) times daily., Disp: , Rfl: 0 .  TURMERIC PO, Take 1 tablet by mouth daily., Disp: , Rfl:  .  hydrochlorothiazide (HYDRODIURIL) 25 MG tablet, Take 25 mg by mouth daily. (Patient not taking: Reported on 04/04/2020), Disp: , Rfl:  .  HYDROcodone-acetaminophen (NORCO) 5-325 MG tablet, Take 1 tablet by mouth every 6 (six) hours as needed for up to 5 days for severe pain., Disp: 20 tablet, Rfl: 0 .  ondansetron (ZOFRAN) 4 MG tablet, Take 1 tablet (4 mg total) by mouth every 8 (eight) hours as needed for nausea or vomiting., Disp: 20 tablet, Rfl: 0 .  triamcinolone cream (KENALOG) 0.1 %, Apply 1 application topically 2  (two) times daily. (Patient not taking: Reported on 02/23/2020), Disp: 30 g, Rfl: 2  Past Medical Problems: Past Medical History:  Diagnosis Date  . Arthritis   . Back pain 10/30/2015  . Bilateral shoulder pain 10/30/2015  . COPD (chronic obstructive pulmonary disease) (HCC)   . Fecal occult blood test positive 10/30/2015  . Hemorrhoids 10/30/2015  . Hypertension   . Hypothyroidism   . Large breasts 10/30/2015   44 DD  . Postmenopausal 10/30/2015  . Sleep apnea    pt DX but did not want to use CPAP.  Marland Kitchen Thyroid disease     Past Surgical History: Past Surgical History:  Procedure Laterality Date  . COLONOSCOPY N/A 11/12/2015   Procedure: COLONOSCOPY;  Surgeon: West Bali, MD;  Location: [facility];  Service: Endoscopy;  Laterality: N/A;  8:30 AM  . JOINT REPLACEMENT    . KNEE ARTHROSCOPY WITH MEDIAL MENISECTOMY Left 05/04/2015   Procedure: KNEE ARTHROSCOPY WITH MEDIAL AND LATERAL MENISECTOMY;  Surgeon: Vickki Hearing, MD;  Location: [facility];  Service: Orthopedics;  Laterality: Left;  . NASAL SINUS SURGERY    . ROTATOR CUFF REPAIR    . SHOULDER ARTHROSCOPY Right 10/12/2018   right rotator cuff repair   . SHOULDER SURGERY Right 10/12/2018   rotator cuff repair   . TOTAL KNEE ARTHROPLASTY Left 01/29/2017   Procedure: TOTAL KNEE ARTHROPLASTY;  Surgeon: Vickki Hearing, MD;  Location: [facility];  Service: Orthopedics;  Laterality: Left;    Social History: Social History   Socioeconomic History  . Marital status: Single    Spouse name: Not on file  . Number of children: Not on file  . Years of education: Not on file  . Highest education level: Not on file  Occupational History  . Not on file  Tobacco Use  . Smoking status: Never Smoker  . Smokeless tobacco: Never Used  Vaping Use  . Vaping Use: Never used  Substance and Sexual Activity  . Alcohol use: No  . Drug use: No  . Sexual activity: Yes    Birth control/protection: Post-menopausal, None  Other Topics Concern  .  Not on file  Social History Narrative  . Not on file   Social Determinants of Health   Financial Resource Strain:   . Difficulty of Paying Living Expenses:   Food Insecurity:   . Worried About Programme researcher, broadcasting/film/video in the Last Year:   . Barista in the Last Year:   Transportation Needs:   . Freight forwarder (Medical):   Marland Kitchen Lack of Transportation (Non-Medical):   Physical Activity:   . Days of Exercise per Week:   . Minutes of Exercise per Session:   Stress:   . Feeling of Stress :   Social Connections:   . Frequency of Communication with Friends and Family:   .  Frequency of Social Gatherings with Friends and Family:   . Attends Religious Services:   . Active Member of Clubs or Organizations:   . Attends Club or Organization Meetings:   . Marital Status:   Intimate Partner Violence:   . Fear of Current or Ex-Partner:   . Emotionally Abused:   . Physically Abused:   . Sexually Abused:     Family History: Family History  Problem Relation Age of Onset  . Alzheimer's disease Mother   . Cancer Father        brain tumor  . Diabetes Brother   . Heart disease Brother   . Heart attack Brother   . Cancer Brother        melanoma  . Lupus Maternal Grandmother   . Heart disease Maternal Grandmother   . Heart disease Maternal Grandfather   . Hypertension Maternal Grandfather   . Breast cancer Maternal Aunt        unsure of age   . Breast cancer Cousin     Review of Systems: Review of Systems  Constitutional: Negative.   Respiratory: Negative for cough.   Cardiovascular: Negative.   Gastrointestinal: Negative.   Genitourinary: Negative.   Musculoskeletal: Positive for joint pain.    Physical Exam: Vital Signs BP (!) 143/84 (BP Location: Left Arm, Patient Position: Sitting, Cuff Size: Large)   Pulse 71   Temp 97.9 F (36.6 C) (Oral)   Ht 5' 3" (1.6 m)   Wt 227 lb 6.4 oz (103.1 kg)   LMP 04/14/2011   SpO2 98%   BMI 40.28 kg/m  Physical  Exam Constitutional:      General: She is not in acute distress.    Appearance: Normal appearance. She is not ill-appearing.  HENT:     Head: Normocephalic and atraumatic.  Eyes:     Pupils: Pupils are equal, round Neck:     Musculoskeletal: Normal range of motion.  Cardiovascular:     Rate and Rhythm: Normal rate and regular rhythm.     Pulses: Normal pulses.     Heart sounds: Normal heart sounds. No murmur.  Pulmonary:     Effort: Pulmonary effort is normal. No respiratory distress.     Breath sounds: Normal breath sounds. No wheezing.  Musculoskeletal: Normal range of motion.  Skin:    General: Skin is warm and dry.     Findings: No erythema or rash.  Neurological:     General: No focal deficit present.     Mental Status: She is alert and oriented to person, place, and time. Mental status is at baseline.     Motor: No weakness.  Psychiatric:        Mood and Affect: Mood normal.        Behavior: Behavior normal.     Assessment/Plan: [name] is scheduled for bilateral breast reduction with Dr. Pace on 7.27.21.  Risks, benefits, and alternatives of procedure discussed, questions answered and consent obtained.    Had a thorough discussion with patient about the different techniques possible for bilateral breast reduction.  She had previously discussed with Dr. Pace amputation technique with skin grafted nipple versus pedicle technique.  Patient has decided that she would like to move forward with a pedicle technique.  We discussed the risk associated with pedicle technique and nipple necrosis due to the large breast size.  She is aware and in agreement with the plan.  She would like to be a C cup.  Smoking Status: non smoker;   Counseling Given? N/A Last Mammogram: Screening mammogram on 03/29/2019; Results: No mammographic evidence of malignancy.  Caprini Score: 6, high; Risk Factors include: age, BMI greater than 25, COPD, Varicose veins and length of planned surgery.  Recommendation for mechanical and pharmacological prophylaxis during surgery. Encourage early ambulation.   PCP surgical clearance form sent to Assunta Found, MD in [place].  Pictures obtained: 12/29/19  Post-op Rx sent to pharmacy: zofran, norco   Patient was provided with the breast reduction and General Surgical Risk consent document and Pain Medication Agreement prior to their appointment.  They had adequate time to read through the risk consent documents and Pain Medication Agreement. We also discussed them in person together during this preop appointment. All of their questions were answered to their satisfaction.  Recommended calling if they have any further questions.  Risk consent form and Pain Medication Agreement to be scanned into patient's chart.   Electronically signed by: Kermit Balo Gisell Buehrle, PA-C 04/04/2020 9:39 AM

## 2020-04-04 ENCOUNTER — Other Ambulatory Visit: Payer: Self-pay

## 2020-04-04 ENCOUNTER — Ambulatory Visit (INDEPENDENT_AMBULATORY_CARE_PROVIDER_SITE_OTHER): Payer: BC Managed Care – PPO | Admitting: Surgical

## 2020-04-04 ENCOUNTER — Encounter: Payer: Self-pay | Admitting: Surgical

## 2020-04-04 VITALS — BP 143/84 | HR 71 | Temp 97.9°F | Ht 63.0 in | Wt 227.4 lb

## 2020-04-04 DIAGNOSIS — N62 Hypertrophy of breast: Secondary | ICD-10-CM

## 2020-04-04 MED ORDER — ONDANSETRON HCL 4 MG PO TABS: 4.0000 mg | ORAL_TABLET | Freq: Three times a day (TID) | ORAL | 0 refills | Status: DC | PRN

## 2020-04-04 MED ORDER — HYDROCODONE-ACETAMINOPHEN 5-325 MG PO TABS: 1.0000 | ORAL_TABLET | Freq: Four times a day (QID) | ORAL | 0 refills | Status: AC | PRN

## 2020-04-05 ENCOUNTER — Telehealth: Payer: Self-pay

## 2020-04-05 NOTE — Telephone Encounter (Signed)
Sent information to [employer] in regards to Zofran 4mg . Waiting on prior authorization.

## 2020-04-09 ENCOUNTER — Other Ambulatory Visit: Payer: Self-pay

## 2020-04-09 ENCOUNTER — Encounter (HOSPITAL_BASED_OUTPATIENT_CLINIC_OR_DEPARTMENT_OTHER): Payer: Self-pay | Admitting: Plastic Surgery

## 2020-04-10 ENCOUNTER — Encounter: Payer: Self-pay | Admitting: Surgical

## 2020-04-10 NOTE — H&P (View-Only) (Signed)
Surgical Clearance has been received from Dr. Assunta Found for patient's upcoming surgery bilateral breast reduction with Dr. Arita Miss (Currently scheduled for 04/17/20) . Medications to hold prior to surgery NONE

## 2020-04-10 NOTE — Progress Notes (Signed)
Surgical Clearance has been received from Dr. John Golding for patient's upcoming surgery bilateral breast reduction with Dr. Pace (Currently scheduled for 04/17/20) . Medications to hold prior to surgery NONE   

## 2020-04-13 NOTE — Telephone Encounter (Signed)
Received email from [employer] Zofran was denied.

## 2020-04-14 ENCOUNTER — Other Ambulatory Visit (HOSPITAL_COMMUNITY)
Admission: RE | Admit: 2020-04-14 | Discharge: 2020-04-14 | Disposition: A | Discharge status: Home / Self Care | Payer: BC Managed Care – PPO | Source: Ambulatory Visit | Attending: Plastic Surgery | Admitting: Plastic Surgery

## 2020-04-14 DIAGNOSIS — Z20822 Contact with and (suspected) exposure to covid-19: Secondary | ICD-10-CM | POA: Insufficient documentation

## 2020-04-14 DIAGNOSIS — Z01812 Encounter for preprocedural laboratory examination: Secondary | ICD-10-CM | POA: Diagnosis not present

## 2020-04-14 LAB — SARS CORONAVIRUS 2 (TAT 6-24 HRS): SARS Coronavirus 2: NEGATIVE

## 2020-04-16 ENCOUNTER — Other Ambulatory Visit: Payer: Self-pay

## 2020-04-16 ENCOUNTER — Other Ambulatory Visit: Payer: Self-pay | Admitting: Surgical

## 2020-04-16 ENCOUNTER — Encounter (HOSPITAL_BASED_OUTPATIENT_CLINIC_OR_DEPARTMENT_OTHER)
Admission: RE | Admit: 2020-04-16 | Discharge: 2020-04-16 | Disposition: A | Discharge status: Home / Self Care | Payer: BC Managed Care – PPO | Source: Ambulatory Visit | Attending: Plastic Surgery | Admitting: Plastic Surgery

## 2020-04-16 DIAGNOSIS — Z7951 Long term (current) use of inhaled steroids: Secondary | ICD-10-CM | POA: Diagnosis not present

## 2020-04-16 DIAGNOSIS — Z79899 Other long term (current) drug therapy: Secondary | ICD-10-CM | POA: Diagnosis not present

## 2020-04-16 DIAGNOSIS — Z01812 Encounter for preprocedural laboratory examination: Secondary | ICD-10-CM | POA: Diagnosis not present

## 2020-04-16 DIAGNOSIS — Z79890 Hormone replacement therapy (postmenopausal): Secondary | ICD-10-CM | POA: Diagnosis not present

## 2020-04-16 DIAGNOSIS — N6031 Fibrosclerosis of right breast: Secondary | ICD-10-CM | POA: Diagnosis not present

## 2020-04-16 DIAGNOSIS — N6032 Fibrosclerosis of left breast: Secondary | ICD-10-CM | POA: Diagnosis not present

## 2020-04-16 DIAGNOSIS — Z808 Family history of malignant neoplasm of other organs or systems: Secondary | ICD-10-CM | POA: Diagnosis not present

## 2020-04-16 DIAGNOSIS — G4730 Sleep apnea, unspecified: Secondary | ICD-10-CM | POA: Diagnosis not present

## 2020-04-16 DIAGNOSIS — N62 Hypertrophy of breast: Secondary | ICD-10-CM | POA: Diagnosis not present

## 2020-04-16 DIAGNOSIS — Z880 Allergy status to penicillin: Secondary | ICD-10-CM | POA: Diagnosis not present

## 2020-04-16 DIAGNOSIS — Z8249 Family history of ischemic heart disease and other diseases of the circulatory system: Secondary | ICD-10-CM | POA: Diagnosis not present

## 2020-04-16 DIAGNOSIS — Z803 Family history of malignant neoplasm of breast: Secondary | ICD-10-CM | POA: Diagnosis not present

## 2020-04-16 DIAGNOSIS — Z96652 Presence of left artificial knee joint: Secondary | ICD-10-CM | POA: Diagnosis not present

## 2020-04-16 DIAGNOSIS — I10 Essential (primary) hypertension: Secondary | ICD-10-CM | POA: Diagnosis not present

## 2020-04-16 DIAGNOSIS — M1990 Unspecified osteoarthritis, unspecified site: Secondary | ICD-10-CM | POA: Diagnosis not present

## 2020-04-16 DIAGNOSIS — J449 Chronic obstructive pulmonary disease, unspecified: Secondary | ICD-10-CM | POA: Diagnosis not present

## 2020-04-16 DIAGNOSIS — E039 Hypothyroidism, unspecified: Secondary | ICD-10-CM | POA: Diagnosis not present

## 2020-04-16 LAB — BASIC METABOLIC PANEL
Anion gap: 10 (ref 5–15)
BUN: 10 mg/dL (ref 6–20)
CO2: 26 mmol/L (ref 22–32)
Calcium: 8.8 mg/dL — ABNORMAL LOW (ref 8.9–10.3)
Chloride: 102 mmol/L (ref 98–111)
Creatinine, Ser: 0.77 mg/dL (ref 0.44–1.00)
GFR calc Af Amer: >60 mL/min (ref >60)
GFR calc non Af Amer: >60 mL/min (ref >60)
Glucose, Bld: 159 mg/dL — ABNORMAL HIGH (ref 70–99)
Potassium: 3.6 mmol/L (ref 3.5–5.1)
Sodium: 138 mmol/L (ref 135–145)

## 2020-04-16 MED ORDER — PROMETHAZINE HCL 12.5 MG PO TABS
12.5000 mg | ORAL_TABLET | Freq: Four times a day (QID) | ORAL | 0 refills | Status: DC | PRN
Start: 2020-04-16 — End: 2020-06-27

## 2020-04-17 ENCOUNTER — Ambulatory Visit (HOSPITAL_BASED_OUTPATIENT_CLINIC_OR_DEPARTMENT_OTHER): Payer: BC Managed Care – PPO | Admitting: Anesthesiology

## 2020-04-17 ENCOUNTER — Ambulatory Visit (HOSPITAL_BASED_OUTPATIENT_CLINIC_OR_DEPARTMENT_OTHER)
Admission: RE | Admit: 2020-04-17 | Discharge: 2020-04-17 | Disposition: A | Discharge status: Home / Self Care | Payer: BC Managed Care – PPO | Attending: Plastic Surgery | Admitting: Plastic Surgery

## 2020-04-17 ENCOUNTER — Other Ambulatory Visit: Payer: Self-pay

## 2020-04-17 ENCOUNTER — Encounter (HOSPITAL_BASED_OUTPATIENT_CLINIC_OR_DEPARTMENT_OTHER): Payer: Self-pay | Admitting: Plastic Surgery

## 2020-04-17 ENCOUNTER — Encounter (HOSPITAL_BASED_OUTPATIENT_CLINIC_OR_DEPARTMENT_OTHER)
Admission: RE | Disposition: A | Discharge status: Home / Self Care | Payer: Self-pay | Source: Home / Self Care | Attending: Plastic Surgery

## 2020-04-17 DIAGNOSIS — Z8249 Family history of ischemic heart disease and other diseases of the circulatory system: Secondary | ICD-10-CM | POA: Insufficient documentation

## 2020-04-17 DIAGNOSIS — G4730 Sleep apnea, unspecified: Secondary | ICD-10-CM | POA: Insufficient documentation

## 2020-04-17 DIAGNOSIS — I10 Essential (primary) hypertension: Secondary | ICD-10-CM | POA: Insufficient documentation

## 2020-04-17 DIAGNOSIS — M1990 Unspecified osteoarthritis, unspecified site: Secondary | ICD-10-CM | POA: Diagnosis not present

## 2020-04-17 DIAGNOSIS — Z803 Family history of malignant neoplasm of breast: Secondary | ICD-10-CM | POA: Diagnosis not present

## 2020-04-17 DIAGNOSIS — N6031 Fibrosclerosis of right breast: Secondary | ICD-10-CM | POA: Insufficient documentation

## 2020-04-17 DIAGNOSIS — N6032 Fibrosclerosis of left breast: Secondary | ICD-10-CM | POA: Insufficient documentation

## 2020-04-17 DIAGNOSIS — Z79890 Hormone replacement therapy (postmenopausal): Secondary | ICD-10-CM | POA: Diagnosis not present

## 2020-04-17 DIAGNOSIS — N62 Hypertrophy of breast: Secondary | ICD-10-CM | POA: Diagnosis not present

## 2020-04-17 DIAGNOSIS — Z79899 Other long term (current) drug therapy: Secondary | ICD-10-CM | POA: Diagnosis not present

## 2020-04-17 DIAGNOSIS — E039 Hypothyroidism, unspecified: Secondary | ICD-10-CM | POA: Diagnosis not present

## 2020-04-17 DIAGNOSIS — Z96652 Presence of left artificial knee joint: Secondary | ICD-10-CM | POA: Insufficient documentation

## 2020-04-17 DIAGNOSIS — Z880 Allergy status to penicillin: Secondary | ICD-10-CM | POA: Insufficient documentation

## 2020-04-17 DIAGNOSIS — J449 Chronic obstructive pulmonary disease, unspecified: Secondary | ICD-10-CM | POA: Insufficient documentation

## 2020-04-17 DIAGNOSIS — Z808 Family history of malignant neoplasm of other organs or systems: Secondary | ICD-10-CM | POA: Insufficient documentation

## 2020-04-17 DIAGNOSIS — N6022 Fibroadenosis of left breast: Secondary | ICD-10-CM | POA: Diagnosis not present

## 2020-04-17 DIAGNOSIS — N6092 Unspecified benign mammary dysplasia of left breast: Secondary | ICD-10-CM | POA: Diagnosis not present

## 2020-04-17 DIAGNOSIS — N6011 Diffuse cystic mastopathy of right breast: Secondary | ICD-10-CM | POA: Diagnosis not present

## 2020-04-17 DIAGNOSIS — Z7951 Long term (current) use of inhaled steroids: Secondary | ICD-10-CM | POA: Diagnosis not present

## 2020-04-17 DIAGNOSIS — N6012 Diffuse cystic mastopathy of left breast: Secondary | ICD-10-CM | POA: Diagnosis not present

## 2020-04-17 HISTORY — PX: BREAST REDUCTION SURGERY: SHX8

## 2020-04-17 SURGERY — MAMMOPLASTY, REDUCTION
Anesthesia: General | Site: Breast | Laterality: Bilateral

## 2020-04-17 MED ORDER — OXYCODONE HCL 5 MG PO TABS
5.0000 mg | ORAL_TABLET | Freq: Once | ORAL | Status: AC | PRN
  Administered 2020-04-17: 5 mg via ORAL

## 2020-04-17 MED ORDER — SUCCINYLCHOLINE CHLORIDE 200 MG/10ML IV SOSY
PREFILLED_SYRINGE | INTRAVENOUS | Status: AC
  Filled 2020-04-17: qty 10

## 2020-04-17 MED ORDER — FENTANYL CITRATE (PF) 100 MCG/2ML IJ SOLN
INTRAMUSCULAR | Status: DC | PRN
  Administered 2020-04-17: 50 ug via INTRAVENOUS
  Administered 2020-04-17: 25 ug via INTRAVENOUS
  Administered 2020-04-17: 100 ug via INTRAVENOUS
  Administered 2020-04-17: 25 ug via INTRAVENOUS
  Administered 2020-04-17: 50 ug via INTRAVENOUS

## 2020-04-17 MED ORDER — FENTANYL CITRATE (PF) 100 MCG/2ML IJ SOLN
INTRAMUSCULAR | Status: AC
  Filled 2020-04-17: qty 2

## 2020-04-17 MED ORDER — LACTATED RINGERS IV SOLN: INTRAVENOUS | Status: DC

## 2020-04-17 MED ORDER — DEXAMETHASONE SODIUM PHOSPHATE 4 MG/ML IJ SOLN
INTRAMUSCULAR | Status: DC | PRN
  Administered 2020-04-17: 5 mg via INTRAVENOUS

## 2020-04-17 MED ORDER — ROCURONIUM BROMIDE 10 MG/ML (PF) SYRINGE
PREFILLED_SYRINGE | INTRAVENOUS | Status: AC
  Filled 2020-04-17: qty 10

## 2020-04-17 MED ORDER — ONDANSETRON HCL 4 MG/2ML IJ SOLN
INTRAMUSCULAR | Status: AC
  Filled 2020-04-17: qty 2

## 2020-04-17 MED ORDER — LACTATED RINGERS IV SOLN
INTRAVENOUS | Status: DC | PRN
  Administered 2020-04-17: 1500 mL

## 2020-04-17 MED ORDER — ROCURONIUM BROMIDE 100 MG/10ML IV SOLN
INTRAVENOUS | Status: DC | PRN
  Administered 2020-04-17: 70 mg via INTRAVENOUS

## 2020-04-17 MED ORDER — PHENYLEPHRINE 40 MCG/ML (10ML) SYRINGE FOR IV PUSH (FOR BLOOD PRESSURE SUPPORT)
PREFILLED_SYRINGE | INTRAVENOUS | Status: AC
  Filled 2020-04-17: qty 10

## 2020-04-17 MED ORDER — CLINDAMYCIN PHOSPHATE 900 MG/50ML IV SOLN
900.0000 mg | INTRAVENOUS | Status: AC
  Administered 2020-04-17: 900 mg via INTRAVENOUS

## 2020-04-17 MED ORDER — EPINEPHRINE PF 1 MG/ML IJ SOLN
INTRAMUSCULAR | Status: AC
  Filled 2020-04-17: qty 1

## 2020-04-17 MED ORDER — NEOMYCIN-POLYMYXIN-DEXAMETH 3.5-10000-0.1 OP OINT
TOPICAL_OINTMENT | OPHTHALMIC | Status: AC
  Filled 2020-04-17: qty 3.5

## 2020-04-17 MED ORDER — BACITRACIN-NEOMYCIN-POLYMYXIN OINTMENT TUBE
TOPICAL_OINTMENT | CUTANEOUS | Status: AC
  Filled 2020-04-17: qty 14.17

## 2020-04-17 MED ORDER — DIPHENHYDRAMINE HCL 50 MG/ML IJ SOLN
INTRAMUSCULAR | Status: DC | PRN
Start: 2020-04-17 — End: 2020-04-17
  Administered 2020-04-17: 6.25 mg via INTRAVENOUS

## 2020-04-17 MED ORDER — ONDANSETRON HCL 4 MG/2ML IJ SOLN
INTRAMUSCULAR | Status: DC | PRN
  Administered 2020-04-17: 4 mg via INTRAVENOUS

## 2020-04-17 MED ORDER — SUGAMMADEX SODIUM 500 MG/5ML IV SOLN
INTRAVENOUS | Status: DC | PRN
Start: 2020-04-17 — End: 2020-04-17
  Administered 2020-04-17: 300 mg via INTRAVENOUS

## 2020-04-17 MED ORDER — EPHEDRINE 5 MG/ML INJ
INTRAVENOUS | Status: AC
  Filled 2020-04-17: qty 10

## 2020-04-17 MED ORDER — FENTANYL CITRATE (PF) 100 MCG/2ML IJ SOLN: 25.0000 ug | INTRAMUSCULAR | Status: DC | PRN

## 2020-04-17 MED ORDER — DIPHENHYDRAMINE HCL 50 MG/ML IJ SOLN
INTRAMUSCULAR | Status: AC
  Filled 2020-04-17: qty 1

## 2020-04-17 MED ORDER — OXYCODONE HCL 5 MG PO TABS
ORAL_TABLET | ORAL | Status: AC
  Filled 2020-04-17: qty 1

## 2020-04-17 MED ORDER — LIDOCAINE 2% (20 MG/ML) 5 ML SYRINGE
INTRAMUSCULAR | Status: AC
  Filled 2020-04-17: qty 5

## 2020-04-17 MED ORDER — BUPIVACAINE HCL (PF) 0.25 % IJ SOLN
INTRAMUSCULAR | Status: AC
  Filled 2020-04-17: qty 30

## 2020-04-17 MED ORDER — BSS IO SOLN
INTRAOCULAR | Status: AC
  Filled 2020-04-17: qty 15

## 2020-04-17 MED ORDER — MIDAZOLAM HCL 5 MG/5ML IJ SOLN
INTRAMUSCULAR | Status: DC | PRN
  Administered 2020-04-17: 2 mg via INTRAVENOUS

## 2020-04-17 MED ORDER — OXYCODONE HCL 5 MG/5ML PO SOLN: 5.0000 mg | Freq: Once | ORAL | Status: AC | PRN

## 2020-04-17 MED ORDER — ACETAMINOPHEN 160 MG/5ML PO SOLN: 1000.0000 mg | Freq: Once | ORAL | Status: DC | PRN

## 2020-04-17 MED ORDER — CLINDAMYCIN PHOSPHATE 900 MG/50ML IV SOLN
INTRAVENOUS | Status: AC
  Filled 2020-04-17: qty 50

## 2020-04-17 MED ORDER — ACETAMINOPHEN 10 MG/ML IV SOLN: 1000.0000 mg | Freq: Once | INTRAVENOUS | Status: DC | PRN

## 2020-04-17 MED ORDER — SUGAMMADEX SODIUM 500 MG/5ML IV SOLN
INTRAVENOUS | Status: AC
  Filled 2020-04-17: qty 5

## 2020-04-17 MED ORDER — MIDAZOLAM HCL 2 MG/2ML IJ SOLN
INTRAMUSCULAR | Status: AC
  Filled 2020-04-17: qty 2

## 2020-04-17 MED ORDER — OXYMETAZOLINE HCL 0.05 % NA SOLN
NASAL | Status: AC
  Filled 2020-04-17: qty 30

## 2020-04-17 MED ORDER — PROPOFOL 10 MG/ML IV BOLUS
INTRAVENOUS | Status: DC | PRN
  Administered 2020-04-17: 150 mg via INTRAVENOUS

## 2020-04-17 MED ORDER — ACETAMINOPHEN 500 MG PO TABS: 1000.0000 mg | ORAL_TABLET | Freq: Once | ORAL | Status: DC | PRN

## 2020-04-17 MED ORDER — DEXAMETHASONE SODIUM PHOSPHATE 10 MG/ML IJ SOLN
INTRAMUSCULAR | Status: AC
  Filled 2020-04-17: qty 1

## 2020-04-17 MED ORDER — LIDOCAINE-EPINEPHRINE 1 %-1:100000 IJ SOLN
INTRAMUSCULAR | Status: AC
  Filled 2020-04-17: qty 1

## 2020-04-17 SURGICAL SUPPLY — 73 items
APL PRP STRL LF DISP 70% ISPRP (MISCELLANEOUS) ×2
APL SKNCLS STERI-STRIP NONHPOA (GAUZE/BANDAGES/DRESSINGS) ×2
BAG DECANTER FOR FLEXI CONT (MISCELLANEOUS) ×1 IMPLANT
BENZOIN TINCTURE PRP APPL 2/3 (GAUZE/BANDAGES/DRESSINGS) ×4 IMPLANT
BLADE SURG 10 STRL SS (BLADE) ×4 IMPLANT
BLADE SURG 15 STRL LF DISP TIS (BLADE) IMPLANT
BLADE SURG 15 STRL SS (BLADE)
BNDG ELASTIC 6X5.8 VLCR STR LF (GAUZE/BANDAGES/DRESSINGS) ×3 IMPLANT
BNDG GAUZE ELAST 4 BULKY (GAUZE/BANDAGES/DRESSINGS) ×2 IMPLANT
CANISTER SUCT 1200ML W/VALVE (MISCELLANEOUS) ×2 IMPLANT
CHLORAPREP W/TINT 26 (MISCELLANEOUS) ×4 IMPLANT
CLIP VESOCCLUDE MED 6/CT (CLIP) IMPLANT
CLSR STERI-STRIP ANTIMIC 1/2X4 (GAUZE/BANDAGES/DRESSINGS) ×1 IMPLANT
COVER BACK TABLE 60X90IN (DRAPES) ×2 IMPLANT
COVER MAYO STAND STRL (DRAPES) ×2 IMPLANT
COVER WAND RF STERILE (DRAPES) IMPLANT
DECANTER SPIKE VIAL GLASS SM (MISCELLANEOUS) IMPLANT
DRAIN CHANNEL 15F RND FF W/TCR (WOUND CARE) IMPLANT
DRAPE LAPAROSCOPIC ABDOMINAL (DRAPES) ×2 IMPLANT
DRAPE UTILITY XL STRL (DRAPES) ×2 IMPLANT
DRSG PAD ABDOMINAL 8X10 ST (GAUZE/BANDAGES/DRESSINGS) ×4 IMPLANT
ELECT REM PT RETURN 9FT ADLT (ELECTROSURGICAL) ×2
ELECTRODE REM PT RTRN 9FT ADLT (ELECTROSURGICAL) ×1 IMPLANT
EVACUATOR SILICONE 100CC (DRAIN) IMPLANT
GAUZE SPONGE 4X4 12PLY STRL (GAUZE/BANDAGES/DRESSINGS) ×4 IMPLANT
GAUZE XEROFORM 5X9 LF (GAUZE/BANDAGES/DRESSINGS) IMPLANT
GLOVE BIO SURGEON STRL SZ 6.5 (GLOVE) ×3 IMPLANT
GLOVE BIO SURGEON STRL SZ7.5 (GLOVE) IMPLANT
GLOVE BIOGEL M STRL SZ7.5 (GLOVE) ×2 IMPLANT
GLOVE BIOGEL PI IND STRL 8 (GLOVE) IMPLANT
GLOVE BIOGEL PI INDICATOR 8 (GLOVE)
GLOVE ECLIPSE 6.5 STRL STRAW (GLOVE) IMPLANT
GOWN STRL REUS W/ TWL LRG LVL3 (GOWN DISPOSABLE) ×2 IMPLANT
GOWN STRL REUS W/TWL LRG LVL3 (GOWN DISPOSABLE) ×10
MARKER SKIN DUAL TIP RULER LAB (MISCELLANEOUS) IMPLANT
NDL FILTER BLUNT 18X1 1/2 (NEEDLE) ×1 IMPLANT
NDL HYPO 25X1 1.5 SAFETY (NEEDLE) IMPLANT
NDL SAFETY ECLIPSE 18X1.5 (NEEDLE) ×1 IMPLANT
NDL SPNL 18GX3.5 QUINCKE PK (NEEDLE) ×1 IMPLANT
NEEDLE FILTER BLUNT 18X 1/2SAF (NEEDLE) ×1
NEEDLE FILTER BLUNT 18X1 1/2 (NEEDLE) ×1 IMPLANT
NEEDLE HYPO 18GX1.5 SHARP (NEEDLE) ×2
NEEDLE HYPO 25X1 1.5 SAFETY (NEEDLE) IMPLANT
NEEDLE SPNL 18GX3.5 QUINCKE PK (NEEDLE) ×2 IMPLANT
NS IRRIG 1000ML POUR BTL (IV SOLUTION) ×2 IMPLANT
PACK BASIN DAY SURGERY FS (CUSTOM PROCEDURE TRAY) ×2 IMPLANT
PENCIL SMOKE EVACUATOR (MISCELLANEOUS) ×2 IMPLANT
PIN SAFETY STERILE (MISCELLANEOUS) IMPLANT
SHEET MEDIUM DRAPE 40X70 STRL (DRAPES) IMPLANT
SLEEVE SCD COMPRESS KNEE MED (MISCELLANEOUS) ×2 IMPLANT
SPONGE LAP 18X18 RF (DISPOSABLE) ×6 IMPLANT
STAPLER INSORB 30 2030 C-SECTI (MISCELLANEOUS) ×3 IMPLANT
STAPLER VISISTAT 35W (STAPLE) ×2 IMPLANT
STRIP SUTURE WOUND CLOSURE 1/2 (MISCELLANEOUS) ×6 IMPLANT
SUT CHROMIC 4 0 PS 2 18 (SUTURE) IMPLANT
SUT ETHILON 2 0 FS 18 (SUTURE) IMPLANT
SUT ETHILON 3 0 PS 1 (SUTURE) IMPLANT
SUT MNCRL AB 4-0 PS2 18 (SUTURE) ×6 IMPLANT
SUT PDS 3-0 CT2 (SUTURE) ×6
SUT PDS II 3-0 CT2 27 ABS (SUTURE) ×2 IMPLANT
SUT VIC AB 3-0 PS1 18 (SUTURE)
SUT VIC AB 3-0 PS1 18XBRD (SUTURE) IMPLANT
SUT VLOC 90 P-14 23 (SUTURE) ×4 IMPLANT
SYR 50ML LL SCALE MARK (SYRINGE) ×3 IMPLANT
SYR BULB IRRIG 60ML STRL (SYRINGE) ×2 IMPLANT
SYR CONTROL 10ML LL (SYRINGE) IMPLANT
TAPE MEASURE VINYL STERILE (MISCELLANEOUS) IMPLANT
TOWEL GREEN STERILE FF (TOWEL DISPOSABLE) ×4 IMPLANT
TRAY FOLEY W/BAG SLVR 14FR LF (SET/KITS/TRAYS/PACK) IMPLANT
TUBE CONNECTING 20X1/4 (TUBING) ×2 IMPLANT
TUBING INFILTRATION IT-10001 (TUBING) ×2 IMPLANT
UNDERPAD 30X36 HEAVY ABSORB (UNDERPADS AND DIAPERS) ×4 IMPLANT
YANKAUER SUCT BULB TIP NO VENT (SUCTIONS) ×2 IMPLANT

## 2020-04-17 NOTE — Op Note (Signed)
Operative Note   DATE OF OPERATION: 04/17/2020  LOCATION:  [facility]ENTER   SURGICAL DEPARTMENT: Plastic Surgery  PREOPERATIVE DIAGNOSES: Bilateral symptomatic macromastia.  POSTOPERATIVE DIAGNOSES:  same  PROCEDURE: Bilateral breast reduction with superomedial pedicle.  SURGEON: Ancil Linsey, MD  ASSISTANT: Zadie Cleverly, PA The advanced practice practitioner (APP) assisted throughout the case.  The APP was essential in retraction and counter traction when needed to make the case progress smoothly.  This retraction and assistance made it possible to see the tissue plans for the procedure.  The assistance was needed for blood control, tissue re-approximation and assisted with closure of the incision site.  ANESTHESIA: General.  COMPLICATIONS: None.   INDICATIONS FOR PROCEDURE:  The patient, [name] is a 54 y.o. female born on 1966-02-10, is here for treatment of bilateral symptomatic macromastia. MRN: [id number]  CONSENT:  Informed consent was obtained directly from the patient. Risks, benefits and alternatives were fully discussed. Specific risks including but not limited to bleeding, infection, hematoma, seroma, scarring, pain, infection, contracture, asymmetry, wound healing problems, and need for further surgery were all discussed. The patient did have an ample opportunity to have questions answered to satisfaction.   DESCRIPTION OF PROCEDURE:  The patient was marked preoperatively for a Wise pattern skin excision.  The patient was taken to the operating room. SCDs were placed and antibiotics were given. General anesthesia was administered.The patient's operative site was prepped and draped in a sterile fashion. A time out was performed and all information was confirmed to be correct.  Right Breast: The breast was infiltrated with tumescent solution to help with hemostasis.  The nipple was marked with a cookie cutter.  A superomedial pedicle was drawn out with  the base of at least 8 cm in size.  A breast tourniquet was then applied and the pedicle was de-epithelialized.  Breast tourniquet was then let down and all incisions were made with a 10 blade.  The pedicle was then isolated down to the chest wall with cautery and the excision was performed removing tissue primarily inferiorly and laterally.  Hemostasis was obtained and the wound was stapled closed.  Left breast:  The breast was infiltrated with tumescent solution to help with hemostasis.  The nipple was marked with a cookie cutter.  A superomedial pedicle was drawn out with the base of at least 8 cm in size.  A breast tourniquet was then applied and the pedicle was de-epithelialized.  Breast tourniquet was then let down and all incisions were made with a 10 blade.  The pedicle was then isolated down to the chest wall with cautery and the excision was performed removing tissue primarily inferiorly and laterally.  Hemostasis was obtained and the wound was stapled closed.  Patient was then set up to check for size and symmetry.  Minor modifications were made.  This resulted in a total of 1227 g removed from the right side and 1373 g removed from the left side.  The inframammary incision was closed with a combination of buried in-sorb staples and a running 3-0 Quill suture.  The vertical and periareolar limbs were closed with interrupted buried 4-0 Monocryl and a running 4-0 Quill suture.  Steri-Strips were then applied along with a soft dressing and Ace wrap.  The patient tolerated the procedure well.  There were no complications. The patient was allowed to wake from anesthesia, extubated and taken to the recovery room in satisfactory condition.  I was present for the entire procedure.

## 2020-04-17 NOTE — Anesthesia Procedure Notes (Addendum)

## 2020-04-17 NOTE — Transfer of Care (Signed)
Immediate Anesthesia Transfer of Care Note  Patient: [name]  Procedure(s) Performed: MAMMARY REDUCTION  (BREAST) (Bilateral Breast)  Patient Location: PACU  Anesthesia Type:General  Level of Consciousness: sedated  Airway & Oxygen Therapy: Patient Spontanous Breathing and Patient connected to face mask oxygen  Post-op Assessment: Report given to RN and Post -op Vital signs reviewed and stable  Post vital signs: Reviewed and stable  Last Vitals:  Vitals Value Taken Time  BP    Temp    Pulse    Resp    SpO2      Last Pain:  Vitals:   04/17/20 0835  TempSrc: Oral  PainSc: 0-No pain         Complications: No complications documented.

## 2020-04-17 NOTE — Anesthesia Procedure Notes (Signed)
Performed by: Anica Alcaraz D, CRNA       

## 2020-04-17 NOTE — Brief Op Note (Signed)
04/17/2020  12:50 PM  PATIENT:  [name]  54 y.o. female  PRE-OPERATIVE DIAGNOSIS:  macromastia  POST-OPERATIVE DIAGNOSIS:  macromastia  PROCEDURE:  Procedure(s) with comments: MAMMARY REDUCTION  (BREAST) (Bilateral) - 2.5 hours, please  SURGEON:  Surgeon(s) and Role:    * Seher Schlagel, Wendy Poet, MD - Primary  PHYSICIAN ASSISTANT: Materials engineer, PA  ASSISTANTS: none   ANESTHESIA:   general  EBL:  40 mL   BLOOD ADMINISTERED:none  DRAINS: none   LOCAL MEDICATIONS USED:  MARCAINE     SPECIMEN:  Source of Specimen:  r and l breast tissue  DISPOSITION OF SPECIMEN:  PATHOLOGY  COUNTS:  YES  TOURNIQUET:  * No tourniquets in log *  DICTATION: .Dragon Dictation  PLAN OF CARE: Discharge to home after PACU  PATIENT DISPOSITION:  PACU - hemodynamically stable.   Delay start of Pharmacological VTE agent (>24hrs) due to surgical blood loss or risk of bleeding: not applicable

## 2020-04-17 NOTE — Discharge Instructions (Addendum)
Activity As tolerated: NO showers until 04/20/20 evening. Keep ACE wrap on breasts until then. After showering, put ACE wrap back on, this is important for compression. No driving while in pain, taking pain medication or if you are unable to safely react to traffic. No heavy activities Take Pain medication as needed for severe pain. Otherwise, for moderate pain you can use ibuprofen or tylenol PRN   Diet: Regular. Drink plenty of fluids and eat healthy, high protein, low carbs.  Wound Care: Keep dressing clean & dry. You may change bandages after showering if you continue to notice some drainage. You can reuse bandages if they are not dirty/soiled.  Special Instructions: Call Doctor if any unusual problems occur such as pain, excessive Bleeding, unrelieved Nausea/vomiting, Fever &/or chills  Follow-up appointment: Scheduled for next week.   OXYCODONE 5MG  GIVEN AT 2:13 PM     Post Anesthesia Home Care Instructions  Activity: Get plenty of rest for the remainder of the day. A responsible individual must stay with you for 24 hours following the procedure.  For the next 24 hours, DO NOT: -Drive a car - -Drink alcoholic beverages -Take any medication unless instructed by your physician -Make any legal decisions or sign important papers.  Meals: Start with liquid foods such as gelatin or soup. Progress to regular foods as tolerated. Avoid greasy, spicy, heavy foods. If nausea and/or vomiting occur, drink only clear liquids until the nausea and/or vomiting subsides. Call your physician if vomiting continues.  Special Instructions/Symptoms: Your throat may feel dry or sore from the anesthesia or the breathing tube placed in your throat during surgery. If this causes discomfort, gargle with warm salt water. The discomfort should disappear within 24 hours.  If you had a scopolamine patch placed behind your ear for the management of post- operative nausea and/or  vomiting:  1. The medication in the patch is effective for 72 hours, after which it should be removed.  Wrap patch in a tissue and discard in the trash. Wash hands thoroughly with soap and water. 2. You may remove the patch earlier than 72 hours if you experience unpleasant side effects which may include dry mouth, dizziness or visual disturbances. 3. Avoid touching the patch. Wash your hands with soap and water after contact with the patch.       Call your surgeon if you experience:   1.  Fever over 101.0. 2.  Inability to urinate. 3.  Nausea and/or vomiting. 4.  Extreme swelling or bruising at the surgical site. 5.  Continued bleeding from the incision. 6.  Increased pain, redness or drainage from the incision. 7.  Problems related to your pain medication. 8.  Any problems and/or concerns

## 2020-04-17 NOTE — Anesthesia Preprocedure Evaluation (Signed)
Anesthesia Evaluation  Patient identified by MRN, date of birth, ID band Patient awake    Reviewed: Allergy & Precautions, NPO status , Patient's Chart, lab work & pertinent test results  History of Anesthesia Complications Negative for: history of anesthetic complications  Airway Mallampati: III  TM Distance: >3 FB Neck ROM: Full    Dental  (+) Dental Advisory Given, Teeth Intact   Pulmonary neg shortness of breath, sleep apnea , COPD,  COPD inhaler, neg recent URI,    breath sounds clear to auscultation       Cardiovascular hypertension, Pt. on medications (-) angina(-) Past MI and (-) CHF (-) dysrhythmias  Rhythm:Regular     Neuro/Psych negative neurological ROS  negative psych ROS   GI/Hepatic negative GI ROS, Neg liver ROS,   Endo/Other  Hypothyroidism Morbid obesity  Renal/GU negative Renal ROS     Musculoskeletal  (+) Arthritis ,   Abdominal   Peds  Hematology negative hematology ROS (+)   Anesthesia Other Findings   Reproductive/Obstetrics                             Anesthesia Physical Anesthesia Plan  ASA: III  Anesthesia Plan: General   Post-op Pain Management:    Induction: Intravenous  PONV Risk Score and Plan: 3 and Ondansetron, Dexamethasone and Propofol infusion  Airway Management Planned: Oral ETT  Additional Equipment: None  Intra-op Plan:   Post-operative Plan: Extubation in OR  Informed Consent: I have reviewed the patients History and Physical, chart, labs and discussed the procedure including the risks, benefits and alternatives for the proposed anesthesia with the patient or authorized representative who has indicated his/her understanding and acceptance.     Dental advisory given  Plan Discussed with: CRNA and Surgeon  Anesthesia Plan Comments:         Anesthesia Quick Evaluation

## 2020-04-17 NOTE — Interval H&P Note (Signed)
History and Physical Interval Note:  04/17/2020 9:40 AM  [name]  has presented today for surgery, with the diagnosis of macromastia.  The various methods of treatment have been discussed with the patient and family. After consideration of risks, benefits and other options for treatment, the patient has consented to  Procedure(s) with comments: MAMMARY REDUCTION  (BREAST) (Bilateral) - 2.5 hours, please as a surgical intervention.  The patient's history has been reviewed, patient examined, no change in status, stable for surgery.  I have reviewed the patient's chart and labs.  Questions were answered to the patient's satisfaction.     Allena Napoleon

## 2020-04-18 ENCOUNTER — Encounter (HOSPITAL_BASED_OUTPATIENT_CLINIC_OR_DEPARTMENT_OTHER): Payer: Self-pay | Admitting: Plastic Surgery

## 2020-04-18 ENCOUNTER — Telehealth: Payer: Self-pay | Admitting: Plastic Surgery

## 2020-04-18 NOTE — Telephone Encounter (Signed)
Called patient to discuss.  Advised her that there is a small opening at the lateral aspect of her incisions that allow for drainage.  She reports that this is where it was draining.  She reports she is otherwise doing well, has no complaints.  Recommend calling with any further questions or concerns.

## 2020-04-18 NOTE — Telephone Encounter (Signed)
Patient [name] stating she has some drainage on the left side and she wants to see if this is normal. Please call patient to advise.

## 2020-04-19 NOTE — Anesthesia Postprocedure Evaluation (Signed)
Anesthesia Post Note  Patient: [name]  Procedure(s) Performed: MAMMARY REDUCTION  (BREAST) (Bilateral Breast)     Patient location during evaluation: PACU Anesthesia Type: General Level of consciousness: awake and alert Pain management: pain level controlled Vital Signs Assessment: post-procedure vital signs reviewed and stable Respiratory status: spontaneous breathing, nonlabored ventilation, respiratory function stable and patient connected to nasal cannula oxygen Cardiovascular status: blood pressure returned to baseline and stable Postop Assessment: no apparent nausea or vomiting Anesthetic complications: no   No complications documented.  Last Vitals:  Vitals:   04/17/20 1414 04/17/20 1423  BP:  (!) 127/95  Pulse: 68 71  Resp: 18 18  Temp:  36.9 C  SpO2: 98% 97%    Last Pain:  Vitals:   04/18/20 1006  TempSrc:   PainSc: 3                  Trayquan Kolakowski

## 2020-04-20 LAB — SURGICAL PATHOLOGY

## 2020-04-25 ENCOUNTER — Ambulatory Visit (INDEPENDENT_AMBULATORY_CARE_PROVIDER_SITE_OTHER): Payer: BC Managed Care – PPO | Admitting: Surgical

## 2020-04-25 ENCOUNTER — Encounter: Payer: BC Managed Care – PPO | Admitting: Plastic Surgery

## 2020-04-25 ENCOUNTER — Other Ambulatory Visit: Payer: Self-pay

## 2020-04-25 ENCOUNTER — Encounter: Payer: Self-pay | Admitting: Surgical

## 2020-04-25 VITALS — BP 120/81 | HR 63 | Temp 98.3°F

## 2020-04-25 DIAGNOSIS — N62 Hypertrophy of breast: Secondary | ICD-10-CM

## 2020-04-25 NOTE — Progress Notes (Signed)
Patient is a 54 year old female here for follow-up after bilateral breast reduction on 04/17/2020 with Dr. Arita Miss.  Patient reports that she has been doing well overall.  She reports that when she removed the Ace wrap previously she had some dizziness.  She has not had any fevers, chills, nausea, vomiting.  No dizziness or weakness today.  On exam bilateral breast incisions are intact, NAC are viable with good cap refill and color.  No erythema.  She does have some mild bruising noted, but this is beginning to yellow and is improved.  She has very slight tenderness to palpation.  Bilateral breasts are soft.  No fluid wave or drainage noted.  Recommend continuing to wear compression, either sports bra or Ace wrap.  Recommend allowing the Steri-Strips to fall off on their own. No sign of infection, seroma, hematoma. Follow-up scheduled for next week, call with any questions or concerns.

## 2020-04-30 NOTE — Interval H&P Note (Signed)
History and Physical Interval Note:  04/30/2020 8:44 AM  [name]  has presented today for surgery, with the diagnosis of macromastia.  The various methods of treatment have been discussed with the patient and family. After consideration of risks, benefits and other options for treatment, the patient has consented to  Procedure(s) with comments: MAMMARY REDUCTION  (BREAST) (Bilateral) - 2.5 hours, please as a surgical intervention.  The patient's history has been reviewed, patient examined, no change in status, stable for surgery.  I have reviewed the patient's chart and labs.  Questions were answered to the patient's satisfaction.     [name]

## 2020-05-03 ENCOUNTER — Telehealth: Payer: Self-pay | Admitting: Surgical

## 2020-05-03 NOTE — Telephone Encounter (Signed)
Called [employer] back to advise that per [facility], sx was medically necessary. Spoke with Chrissy who said she will forward information on.

## 2020-05-07 ENCOUNTER — Telehealth: Payer: Self-pay | Admitting: Plastic Surgery

## 2020-05-07 NOTE — Telephone Encounter (Signed)
Patient called to advise she still has steri strips on and they smell. She wants to know if it's normal for them to be on this long and if not, please call to advise how to remove them.

## 2020-05-07 NOTE — Telephone Encounter (Signed)
Returned patient's call. Patient indicated her strips are starting to peel off. I advised her that it was ok to gently remove only the ones that are loose. Patient understood and agreed. Reminder her of her appointment with [facility] 05/09/20 at 9:20am.

## 2020-05-09 ENCOUNTER — Encounter: Payer: Self-pay | Admitting: Surgical

## 2020-05-09 ENCOUNTER — Other Ambulatory Visit: Payer: Self-pay

## 2020-05-09 ENCOUNTER — Ambulatory Visit (INDEPENDENT_AMBULATORY_CARE_PROVIDER_SITE_OTHER): Payer: BC Managed Care – PPO | Admitting: Surgical

## 2020-05-09 VITALS — BP 142/77 | HR 75 | Temp 98.0°F

## 2020-05-09 DIAGNOSIS — N62 Hypertrophy of breast: Secondary | ICD-10-CM

## 2020-05-09 DIAGNOSIS — Z9889 Other specified postprocedural states: Secondary | ICD-10-CM

## 2020-05-09 NOTE — Progress Notes (Signed)
Patient is a 54 year old female here for follow-up after bilateral breast reduction on 04/17/2020 with Dr. Arita Miss.  Patient is overall doing well, she reports that the Steri-Strips have not come off yet.  She reports that she has noticed a little bit of a foul odor.  She would like to have the Steri-Strips removed today.  She is not having any fevers, chills, nausea, vomiting.  Chaperone present on exam On exam Steri-Strips are in place, no excessive drainage noted.  Steri-Strips removed and incisions are overall C/D/I.  She does have a small wound noted at the junction of the vertical limb and inframammary fold incision that is approximately 5 x 4 mm with scant amount of yellowish exudate noted within the wound bed.  There is no periwound erythema or cellulitic changes.  The area is nontender to palpation.  There is no foul odor noted.  All other incisions are C/D/I.  Bilateral NAC's are viable without any signs of compromise.  Bilateral breasts are soft without any positive fluid waves or indurated areas.  Recommend applying Vaseline daily to left inframammary fold wound, discussed with patient that this does not appear infected.  No sign of seroma or hematoma.  Continue to wear sports bra 24/7, avoid strenuous activity or lifting greater than 15 pounds at this time.  Call with questions or concerns, follow-up in approximately 2 weeks for reevaluation prior to returning to work.

## 2020-05-24 ENCOUNTER — Ambulatory Visit (INDEPENDENT_AMBULATORY_CARE_PROVIDER_SITE_OTHER): Payer: BC Managed Care – PPO | Admitting: Surgical

## 2020-05-24 ENCOUNTER — Other Ambulatory Visit: Payer: Self-pay

## 2020-05-24 ENCOUNTER — Encounter: Payer: Self-pay | Admitting: Surgical

## 2020-05-24 VITALS — BP 146/87 | HR 66 | Temp 97.7°F

## 2020-05-24 DIAGNOSIS — Z9889 Other specified postprocedural states: Secondary | ICD-10-CM

## 2020-05-24 DIAGNOSIS — N62 Hypertrophy of breast: Secondary | ICD-10-CM

## 2020-05-24 NOTE — Progress Notes (Signed)
Patient is a 54 year old female here for follow-up after bilateral breast reduction 04/17/2020 with Dr. Arita Miss.  She is overall doing well, she still has a small wound under the left breast inframammary fold and vertical limb junction.  She reports she has been applying Vaseline to this daily with 4 x 4 gauze.  She is ready to return to work next week.  She overall feels well, has noticed some itching along bilateral breast incisions.  Chaperone present on exam On exam bilateral NAC's are viable with good color, incisions are intact overall with a small wound noted at the junction of the vertical limb and inframammary fold of the left breast, this area is approximately 2 x 2 mm without any surrounding erythema.  No foul odor or purulent drainage.  No tenderness palpation. She has some firmness of the right breast around the NAC laterally, feels consistent with fat necrosis.  This is nontender to palpation.  She has a little bit of redness of the right breast but no cellulitic changes or induration/fluctuance.  No rashes noted.   Recommend continuing to apply Vaseline to left breast inframammary fold wound.  No sign of infection, seroma, hematoma. Recommend massaging right breast area of firmness to help break up any fat necrosis.  Call if redness worsens or she develops any infectious symptoms. Patient can return to work on 05/29/2020 with no restrictions.  Follow-up scheduled for 1 month for reevaluation, call with any questions or concerns prior to that visit.

## 2020-06-15 ENCOUNTER — Ambulatory Visit: Payer: BC Managed Care – PPO | Admitting: Surgical

## 2020-06-20 ENCOUNTER — Ambulatory Visit: Payer: BC Managed Care – PPO | Admitting: Surgical

## 2020-06-27 ENCOUNTER — Other Ambulatory Visit: Payer: Self-pay

## 2020-06-27 ENCOUNTER — Ambulatory Visit (INDEPENDENT_AMBULATORY_CARE_PROVIDER_SITE_OTHER): Payer: BC Managed Care – PPO | Admitting: Plastic Surgery

## 2020-06-27 ENCOUNTER — Encounter: Payer: Self-pay | Admitting: Plastic Surgery

## 2020-06-27 VITALS — BP 142/80 | HR 86 | Temp 97.9°F

## 2020-06-27 DIAGNOSIS — N62 Hypertrophy of breast: Secondary | ICD-10-CM

## 2020-06-27 NOTE — Progress Notes (Signed)
Patient is about 10 weeks postop from bilateral breast reduction.  She feels that overall she is doing well.  She still will intermittently get a little bit of drainage from both sides.  She does not have much in the way of pain or tenderness at this point.  She is overall happy with the results.  On exam she has a few spitting sutures and staples along the inframammary limbs that were all excised.  The rest of the incisions look to be healing fine.  I offered a another follow-up appointment in a month versus follow-up as needed and she is elected to follow-up as needed at this point.  She knows to call us with any questions or concerns.

## 2020-10-15 DIAGNOSIS — Z0000 Encounter for general adult medical examination without abnormal findings: Secondary | ICD-10-CM | POA: Diagnosis not present

## 2020-10-15 DIAGNOSIS — Z6837 Body mass index (BMI) 37.0-37.9, adult: Secondary | ICD-10-CM | POA: Diagnosis not present

## 2020-10-15 DIAGNOSIS — Z1331 Encounter for screening for depression: Secondary | ICD-10-CM | POA: Diagnosis not present

## 2020-11-05 DIAGNOSIS — Z1389 Encounter for screening for other disorder: Secondary | ICD-10-CM | POA: Diagnosis not present

## 2020-11-05 DIAGNOSIS — Z6837 Body mass index (BMI) 37.0-37.9, adult: Secondary | ICD-10-CM | POA: Diagnosis not present

## 2020-11-05 DIAGNOSIS — Z0000 Encounter for general adult medical examination without abnormal findings: Secondary | ICD-10-CM | POA: Diagnosis not present

## 2020-11-05 DIAGNOSIS — R7309 Other abnormal glucose: Secondary | ICD-10-CM | POA: Diagnosis not present

## 2020-11-08 DIAGNOSIS — Z0000 Encounter for general adult medical examination without abnormal findings: Secondary | ICD-10-CM | POA: Diagnosis not present

## 2020-11-13 ENCOUNTER — Other Ambulatory Visit: Payer: Self-pay | Admitting: Physician Assistant

## 2020-11-13 DIAGNOSIS — Z1231 Encounter for screening mammogram for malignant neoplasm of breast: Secondary | ICD-10-CM

## 2020-11-29 DIAGNOSIS — H35372 Puckering of macula, left eye: Secondary | ICD-10-CM | POA: Diagnosis not present

## 2021-01-03 ENCOUNTER — Ambulatory Visit: Payer: BC Managed Care – PPO

## 2021-01-21 DIAGNOSIS — Z6837 Body mass index (BMI) 37.0-37.9, adult: Secondary | ICD-10-CM | POA: Diagnosis not present

## 2021-01-21 DIAGNOSIS — K529 Noninfective gastroenteritis and colitis, unspecified: Secondary | ICD-10-CM | POA: Diagnosis not present

## 2021-01-21 DIAGNOSIS — F329 Major depressive disorder, single episode, unspecified: Secondary | ICD-10-CM | POA: Diagnosis not present

## 2021-02-21 ENCOUNTER — Other Ambulatory Visit: Payer: Self-pay

## 2021-02-21 ENCOUNTER — Ambulatory Visit: Payer: BC Managed Care – PPO | Admitting: Orthopedic Surgery

## 2021-02-21 ENCOUNTER — Ambulatory Visit: Payer: BC Managed Care – PPO

## 2021-02-21 ENCOUNTER — Encounter: Payer: Self-pay | Admitting: Orthopedic Surgery

## 2021-02-21 VITALS — BP 173/94 | HR 72 | Ht 60.0 in | Wt 223.1 lb

## 2021-02-21 DIAGNOSIS — G8929 Other chronic pain: Secondary | ICD-10-CM

## 2021-02-21 DIAGNOSIS — Z96652 Presence of left artificial knee joint: Secondary | ICD-10-CM

## 2021-02-21 DIAGNOSIS — M25561 Pain in right knee: Secondary | ICD-10-CM | POA: Diagnosis not present

## 2021-02-21 DIAGNOSIS — M1712 Unilateral primary osteoarthritis, left knee: Secondary | ICD-10-CM | POA: Diagnosis not present

## 2021-02-21 NOTE — Progress Notes (Signed)
Annual follow-up for left total knee  Status post left total knee fixed-bearing Sigma posterior stabilized total knee  SUBJECTIVE: Right knee pain, left knee swells occasionally  56 year old female had a left total knee doing well except for some occasional swelling  1 leg is definitely longer than the other because the right knee is in varus and the left knee is in corrected valgus for the replacement  Right knee skin looks normal it is tender to palpation on the medial joint line there is no effusion the range of motion is 120 degrees the stability tests were normal and there was no muscle atrophy  X-ray shows normal alignment with no loosening or polyethylene wear  Procedure note right knee injection Medications Depo-Medrol 40 mg and lidocaine 1% 2 cc Timeout Right knee 21-gauge needle anterolateral approach with knee in flexion Ethyl chloride and alcohol No complications  FU AS NEEDED

## 2021-02-21 NOTE — Patient Instructions (Signed)
Return as needed

## 2021-03-11 ENCOUNTER — Ambulatory Visit
Admission: RE | Admit: 2021-03-11 | Discharge: 2021-03-11 | Disposition: A | Discharge status: Home / Self Care | Payer: BC Managed Care – PPO | Source: Ambulatory Visit | Attending: Physician Assistant | Admitting: Physician Assistant

## 2021-03-11 ENCOUNTER — Other Ambulatory Visit: Payer: Self-pay

## 2021-03-11 ENCOUNTER — Other Ambulatory Visit: Payer: Self-pay | Admitting: Physician Assistant

## 2021-03-11 DIAGNOSIS — N63 Unspecified lump in unspecified breast: Secondary | ICD-10-CM

## 2021-03-11 DIAGNOSIS — Z1231 Encounter for screening mammogram for malignant neoplasm of breast: Secondary | ICD-10-CM

## 2021-03-21 ENCOUNTER — Ambulatory Visit
Admission: RE | Admit: 2021-03-21 | Discharge: 2021-03-21 | Disposition: A | Discharge status: Home / Self Care | Payer: BC Managed Care – PPO | Source: Ambulatory Visit | Attending: Physician Assistant | Admitting: Physician Assistant

## 2021-03-21 ENCOUNTER — Other Ambulatory Visit: Payer: Self-pay

## 2021-03-21 DIAGNOSIS — R922 Inconclusive mammogram: Secondary | ICD-10-CM | POA: Diagnosis not present

## 2021-03-21 DIAGNOSIS — N63 Unspecified lump in unspecified breast: Secondary | ICD-10-CM

## 2021-08-29 ENCOUNTER — Encounter: Payer: Self-pay | Admitting: Orthopedic Surgery

## 2021-08-29 ENCOUNTER — Ambulatory Visit (INDEPENDENT_AMBULATORY_CARE_PROVIDER_SITE_OTHER): Payer: 59 | Admitting: Orthopedic Surgery

## 2021-08-29 ENCOUNTER — Other Ambulatory Visit: Payer: Self-pay

## 2021-08-29 DIAGNOSIS — M25561 Pain in right knee: Secondary | ICD-10-CM

## 2021-08-29 DIAGNOSIS — G8929 Other chronic pain: Secondary | ICD-10-CM | POA: Diagnosis not present

## 2021-08-29 NOTE — Progress Notes (Signed)
Patient requested joint injection  Chief Complaint  Patient presents with   Injections    Right knee     Right knee injectedProcedure note  Injection  Verbal consent was obtained to inject the right knee   Timeout procedure was completed to confirm injection site  Diagnosis right knee pain chronic osteoarthritis  Medications used Depo-Medrol 40 mg Lidocaine 1% plain 3 cc  Anesthesia was provided by ethyl chloride spray  Prep was performed with alcohol  Technique of injection the knee was placed in 90 degrees of flexion injection was given through the lateral compartment  No complications were noted

## 2021-09-05 ENCOUNTER — Ambulatory Visit (INDEPENDENT_AMBULATORY_CARE_PROVIDER_SITE_OTHER): Payer: 59

## 2021-09-05 ENCOUNTER — Encounter: Payer: Self-pay | Admitting: Emergency Medicine

## 2021-09-05 ENCOUNTER — Ambulatory Visit
Admission: EM | Admit: 2021-09-05 | Discharge: 2021-09-05 | Disposition: A | Discharge status: Home / Self Care | Payer: 59 | Attending: Urgent Care | Admitting: Urgent Care

## 2021-09-05 ENCOUNTER — Other Ambulatory Visit: Payer: Self-pay

## 2021-09-05 DIAGNOSIS — S93601A Unspecified sprain of right foot, initial encounter: Secondary | ICD-10-CM | POA: Diagnosis not present

## 2021-09-05 DIAGNOSIS — M25471 Effusion, right ankle: Secondary | ICD-10-CM | POA: Diagnosis not present

## 2021-09-05 DIAGNOSIS — M79671 Pain in right foot: Secondary | ICD-10-CM

## 2021-09-05 DIAGNOSIS — M25571 Pain in right ankle and joints of right foot: Secondary | ICD-10-CM

## 2021-09-05 NOTE — ED Triage Notes (Signed)
Twisted right foot yesterday.  Today was going up steps and felt a pop with immediate pain in right foot.

## 2021-09-05 NOTE — ED Provider Notes (Signed)
[facility]   MRN: [id number] DOB: June 15, 1966  Subjective:   [name] is a 55 y.o. female presenting for 1 day history of persistent and worsening right foot pain, difficulty bearing weight.  Patient twisted her foot by stepping on a rock accidentally as she was going up her steps to the home.  She had injured it a week before but had not been as bad as today.  Patient does have a history of arthritis of both feet.  Takes diclofenac regularly for her aches and pains.  No current facility-administered medications for this encounter.  Current Outpatient Medications:    Acetaminophen (TYLENOL PO), Take by mouth as needed. , Disp: , Rfl:    budesonide-formoterol (SYMBICORT) 80-4.5 MCG/ACT inhaler, Inhale 2 puffs into the lungs 2 (two) times daily., Disp: , Rfl:    buPROPion (WELLBUTRIN XL) 150 MG 24 hr tablet, Take 150 mg by mouth daily., Disp: , Rfl:    Cholecalciferol (VITAMIN D PO), Take by mouth daily., Disp: , Rfl:    diclofenac (VOLTAREN) 75 MG EC tablet, Take 75 mg by mouth 2 (two) times daily., Disp: , Rfl:    escitalopram (LEXAPRO) 20 MG tablet, Take 25 mg by mouth daily., Disp: , Rfl:    hydrochlorothiazide (HYDRODIURIL) 25 MG tablet, Take 25 mg by mouth daily. , Disp: , Rfl:    levothyroxine (SYNTHROID, LEVOTHROID) 25 MCG tablet, Take 25 mcg by mouth daily before breakfast., Disp: , Rfl:    MAGNESIUM PO, Take by mouth daily., Disp: , Rfl:    Omega-3 Fatty Acids (FISH OIL PO), Take by mouth daily., Disp: , Rfl:    Allergies  Allergen Reactions   Penicillins Other (See Comments)    Yeast infection Has patient had a PCN reaction causing immediate rash, facial/tongue/throat swelling, SOB or lightheadedness with hypotension: No Has patient had a PCN reaction causing severe rash involving mucus membranes or skin necrosis: No Has patient had a PCN reaction that required hospitalization No Has patient had a PCN reaction occurring within the last 10 years:  No If all of the above answers are "NO", then may proceed with Cephalosporin use.     Past Medical History:  Diagnosis Date   Arthritis    Back pain 10/30/2015   Bilateral shoulder pain 10/30/2015   COPD (chronic obstructive pulmonary disease) (HCC)    Fecal occult blood test positive 10/30/2015   Hemorrhoids 10/30/2015   Hypertension    Hypothyroidism    Large breasts 10/30/2015   44 DD   Postmenopausal 10/30/2015   Sleep apnea    pt DX but did not want to use CPAP.   Thyroid disease      Past Surgical History:  Procedure Laterality Date   BREAST REDUCTION SURGERY Bilateral 04/17/2020   Procedure: MAMMARY REDUCTION  (BREAST);  Surgeon: Allena Napoleon, MD;  Location: [facility];  Service: Plastics;  Laterality: Bilateral;  2.5 hours, please   COLONOSCOPY N/A 11/12/2015   Procedure: COLONOSCOPY;  Surgeon: West Bali, MD;  Location: [facility];  Service: Endoscopy;  Laterality: N/A;  8:30 AM   JOINT REPLACEMENT     KNEE ARTHROSCOPY WITH MEDIAL MENISECTOMY Left 05/04/2015   Procedure: KNEE ARTHROSCOPY WITH MEDIAL AND LATERAL MENISECTOMY;  Surgeon: Vickki Hearing, MD;  Location: [facility];  Service: Orthopedics;  Laterality: Left;   NASAL SINUS SURGERY     ROTATOR CUFF REPAIR     SHOULDER ARTHROSCOPY Right 10/12/2018   right rotator cuff repair  SHOULDER SURGERY Right 10/12/2018   rotator cuff repair    TOTAL KNEE ARTHROPLASTY Left 01/29/2017   Procedure: TOTAL KNEE ARTHROPLASTY;  Surgeon: Vickki Hearing, MD;  Location: [facility];  Service: Orthopedics;  Laterality: Left;    Family History  Problem Relation Age of Onset   Alzheimer's disease Mother    Cancer Father        brain tumor   Diabetes Brother    Heart disease Brother    Heart attack Brother    Cancer Brother        melanoma   Lupus Maternal Grandmother    Heart disease Maternal Grandmother    Heart disease Maternal Grandfather    Hypertension Maternal Grandfather    Breast cancer Maternal Aunt         unsure of age    Breast cancer Cousin     Social History   Tobacco Use   Smoking status: Never   Smokeless tobacco: Never  Vaping Use   Vaping Use: Never used  Substance Use Topics   Alcohol use: Yes    Comment: rare   Drug use: No    ROS   Objective:   Vitals: BP (!) 171/96 (BP Location: Right Arm)    Pulse (!) 104    Temp 98 F (36.7 C) (Oral)    Resp 18    LMP 04/14/2011    SpO2 97%   Physical Exam Constitutional:      General: She is not in acute distress.    Appearance: Normal appearance. She is well-developed. She is not ill-appearing.  HENT:     Head: Normocephalic and atraumatic.     Nose: Nose normal.     Mouth/Throat:     Mouth: Mucous membranes are moist.     Pharynx: Oropharynx is clear.  Eyes:     General: No scleral icterus.    Extraocular Movements: Extraocular movements intact.     Pupils: Pupils are equal, round, and reactive to light.  Cardiovascular:     Rate and Rhythm: Normal rate.  Pulmonary:     Effort: Pulmonary effort is normal.  Musculoskeletal:     Right ankle: Swelling present. No deformity, ecchymosis or lacerations. Tenderness present over the ATF ligament, AITF ligament and base of 5th metatarsal. No lateral malleolus, medial malleolus, CF ligament, posterior TF ligament or proximal fibula tenderness. Decreased range of motion.     Right foot: Decreased range of motion. Normal capillary refill. Swelling, tenderness and bony tenderness present. No deformity, laceration or crepitus.       Feet:  Skin:    General: Skin is warm and dry.  Neurological:     General: No focal deficit present.     Mental Status: She is alert and oriented to person, place, and time.  Psychiatric:        Mood and Affect: Mood normal.        Behavior: Behavior normal.     DG Foot Complete Right  Result Date: 09/05/2021 CLINICAL DATA:  Twisting injury yesterday, pain EXAM: RIGHT FOOT COMPLETE - 3+ VIEW COMPARISON:  10/14/2017 FINDINGS: Frontal,  oblique, and lateral views of the right foot are obtained. No acute displaced fractures. Hammertoe deformities are noted of the second through fourth digits. Mild osteoarthritis most pronounced at the tarsometatarsal joints and first metatarsophalangeal joint. There is moderate soft tissue swelling of the forefoot. Prominent inferior calcaneal spur. IMPRESSION: 1. Soft tissue swelling of the forefoot. 2. No acute fracture. 3. Osteoarthritis. Electronically Signed  By: Sharlet Salina M.D.   On: 09/05/2021 18:46    Right ankle/foot wrapped using 4" Ace wrap in figure-8 method.   Assessment and Plan :   PDMP not reviewed this encounter.  1. Foot sprain, right, initial encounter   2. Right foot pain   3. Pain and swelling of ankle, right    Will manage for foot sprain with rice method, diclofenac. Counseled patient on potential for adverse effects with medications prescribed/recommended today, ER and return-to-clinic precautions discussed, patient verbalized understanding.   Wallis Bamberg, PA-C 09/05/21 [id number]

## 2021-10-28 ENCOUNTER — Other Ambulatory Visit: Payer: Self-pay

## 2021-10-28 ENCOUNTER — Ambulatory Visit (HOSPITAL_COMMUNITY)
Admission: RE | Admit: 2021-10-28 | Discharge: 2021-10-28 | Disposition: A | Discharge status: Home / Self Care | Payer: Self-pay | Source: Ambulatory Visit | Attending: Physician Assistant | Admitting: Physician Assistant

## 2021-10-28 ENCOUNTER — Other Ambulatory Visit (HOSPITAL_COMMUNITY): Payer: Self-pay | Admitting: Physician Assistant

## 2021-10-28 DIAGNOSIS — M25562 Pain in left knee: Secondary | ICD-10-CM

## 2022-02-17 ENCOUNTER — Ambulatory Visit
Admission: EM | Admit: 2022-02-17 | Discharge: 2022-02-17 | Disposition: A | Discharge status: Home / Self Care | Payer: 59 | Attending: Nurse Practitioner | Admitting: Nurse Practitioner

## 2022-02-17 ENCOUNTER — Ambulatory Visit: Payer: Self-pay

## 2022-02-17 ENCOUNTER — Ambulatory Visit (INDEPENDENT_AMBULATORY_CARE_PROVIDER_SITE_OTHER): Payer: 59

## 2022-02-17 DIAGNOSIS — L03032 Cellulitis of left toe: Secondary | ICD-10-CM

## 2022-02-17 DIAGNOSIS — M79675 Pain in left toe(s): Secondary | ICD-10-CM | POA: Diagnosis not present

## 2022-02-17 DIAGNOSIS — M79672 Pain in left foot: Secondary | ICD-10-CM

## 2022-02-17 MED ORDER — CEPHALEXIN 500 MG PO CAPS: 500.0000 mg | ORAL_CAPSULE | Freq: Four times a day (QID) | ORAL | 0 refills | Status: AC

## 2022-02-17 NOTE — ED Triage Notes (Signed)
Pt presents with left toe injury from last Friday, then toe blistered

## 2022-02-17 NOTE — ED Provider Notes (Signed)
[facility]    CSN: [id number] Arrival date & time: 02/17/22  1420      History   Chief Complaint Chief Complaint  Patient presents with   Toe Injury    HPI [name] is a 56 y.o. female.   Patient presents with left great and second toe pain.  Reports she stubbed her great toe last week and shortly after, developed a blister on the second toe.  She reports she popped the blister about 5 days ago at home a new blister has developed.  She reports the left second toe is red and swollen.  She denies fevers, nausea/vomiting, numbness or tingling in her toes, redness streaking up her legs, lower extremity edema.  She denies any drainage from the blister.  She reports it is painful.   Past Medical History:  Diagnosis Date   Arthritis    Back pain 10/30/2015   Bilateral shoulder pain 10/30/2015   COPD (chronic obstructive pulmonary disease) (HCC)    Fecal occult blood test positive 10/30/2015   Hemorrhoids 10/30/2015   Hypertension    Hypothyroidism    Large breasts 10/30/2015   44 DD   Postmenopausal 10/30/2015   Sleep apnea    pt DX but did not want to use CPAP.   Thyroid disease     Patient Active Problem List   Diagnosis Date Noted   Screening for colorectal cancer 12/07/2018   Encounter for gynecological examination with Papanicolaou smear of cervix 0000000   Lichen planus 0000000   Primary osteoarthritis of both hands 12/04/2017   Primary osteoarthritis of right knee 12/04/2017   Primary osteoarthritis of both feet 12/04/2017   History of hypothyroidism 12/04/2017   Dyslipidemia 12/04/2017   Status post total left knee replacement 01/29/17 10/14/2017   Special screening for malignant neoplasms, colon    Hemorrhoids 10/30/2015   Fecal occult blood test positive 10/30/2015   Postmenopausal 10/30/2015   Large breasts 10/30/2015   Bilateral shoulder pain 10/30/2015   Back pain 10/30/2015   Medial meniscus tear    Lateral meniscus tear, current     Erosive lichen planus of vulva 08/28/2014   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   Right hip flexor tightness 12/06/2013   Plantar fasciitis 12/06/2013    Past Surgical History:  Procedure Laterality Date   BREAST REDUCTION SURGERY Bilateral 04/17/2020   Procedure: MAMMARY REDUCTION  (BREAST);  Surgeon: Cindra Presume, MD;  Location: [facility];  Service: Plastics;  Laterality: Bilateral;  2.5 hours, please   COLONOSCOPY N/A 11/12/2015   Procedure: COLONOSCOPY;  Surgeon: Danie Binder, MD;  Location: [facility];  Service: Endoscopy;  Laterality: N/A;  8:30 AM   JOINT REPLACEMENT     KNEE ARTHROSCOPY WITH MEDIAL MENISECTOMY Left 05/04/2015   Procedure: KNEE ARTHROSCOPY WITH MEDIAL AND LATERAL MENISECTOMY;  Surgeon: Carole Civil, MD;  Location: [facility];  Service: Orthopedics;  Laterality: Left;   NASAL SINUS SURGERY     ROTATOR CUFF REPAIR     SHOULDER ARTHROSCOPY Right 10/12/2018   right rotator cuff repair    SHOULDER SURGERY Right 10/12/2018   rotator cuff repair    TOTAL KNEE ARTHROPLASTY Left 01/29/2017   Procedure: TOTAL KNEE ARTHROPLASTY;  Surgeon: Carole Civil, MD;  Location: [facility];  Service: Orthopedics;  Laterality: Left;    OB History     Gravida  0   Para  0   Term  0   Preterm  0   AB  0   Living  0      SAB  0   IAB  0   Ectopic  0   Multiple  0   Live Births               Home Medications    Prior to Admission medications   Medication Sig Start Date End Date Taking? Authorizing Provider  cephALEXin (KEFLEX) 500 MG capsule Take 1 capsule (500 mg total) by mouth 4 (four) times daily for 5 days. 02/17/22 02/22/22 Yes Noemi Chapel A, NP  Acetaminophen (TYLENOL PO) Take by mouth as needed.     [provider]  budesonide-formoterol (SYMBICORT) 80-4.5 MCG/ACT inhaler Inhale 2 puffs into the lungs 2 (two) times daily.    [provider]  buPROPion (WELLBUTRIN XL) 150 MG 24 hr tablet Take 150 mg by mouth  daily. 03/12/20   [provider]  Cholecalciferol (VITAMIN D PO) Take by mouth daily.    [provider]  diclofenac (VOLTAREN) 75 MG EC tablet Take 75 mg by mouth 2 (two) times daily.    [provider]  escitalopram (LEXAPRO) 20 MG tablet Take 25 mg by mouth daily.    [provider]  hydrochlorothiazide (HYDRODIURIL) 25 MG tablet Take 25 mg by mouth daily.     [provider]  levothyroxine (SYNTHROID, LEVOTHROID) 25 MCG tablet Take 25 mcg by mouth daily before breakfast.    [provider]  MAGNESIUM PO Take by mouth daily.    [provider]  Omega-3 Fatty Acids (FISH OIL PO) Take by mouth daily.    [provider]    Family History Family History  Problem Relation Age of Onset   Alzheimer's disease Mother    Cancer Father        brain tumor   Diabetes Brother    Heart disease Brother    Heart attack Brother    Cancer Brother        melanoma   Lupus Maternal Grandmother    Heart disease Maternal Grandmother    Heart disease Maternal Grandfather    Hypertension Maternal Grandfather    Breast cancer Maternal Aunt        unsure of age    Breast cancer Cousin     Social History Social History   Tobacco Use   Smoking status: Never   Smokeless tobacco: Never  Vaping Use   Vaping Use: Never used  Substance Use Topics   Alcohol use: Yes    Comment: rare   Drug use: No     Allergies   Penicillins   Review of Systems Review of Systems Per HPI  Physical Exam Triage Vital Signs ED Triage Vitals [02/17/22 1533]  Enc Vitals Group     BP (!) 159/92     Pulse Rate 76     Resp 18     Temp (!) 97.5 F (36.4 C)     Temp src      SpO2 96 %     Weight      Height      Head Circumference      Peak Flow      Pain Score 5     Pain Loc      Pain Edu?      Excl. in [place]?    No data found.  Updated Vital Signs BP (!) 159/92   Pulse 76   Temp (!) 97.5 F (36.4 C)   Resp 18  LMP 04/14/2011    SpO2 96%   Visual Acuity Right Eye Distance:   Left Eye Distance:   Bilateral Distance:    Right Eye Near:   Left Eye Near:    Bilateral Near:     Physical Exam Vitals and nursing note reviewed.  Constitutional:      General: She is not in acute distress.    Appearance: Normal appearance. She is not toxic-appearing.  Pulmonary:     Effort: Pulmonary effort is normal. No respiratory distress.  Musculoskeletal:     Right foot: Normal.     Left foot: Normal capillary refill. Swelling, tenderness and bony tenderness present. Normal pulse.     Comments: Left index toe redness, tenderness to palpation.  No warmth or oozing.  No obvious deformity.    Skin:    General: Skin is warm and dry.     Capillary Refill: Capillary refill takes less than 2 seconds.     Coloration: Skin is not jaundiced or pale.     Findings: No erythema.  Neurological:     Mental Status: She is alert and oriented to person, place, and time.  Psychiatric:        Behavior: Behavior is cooperative.     UC Treatments / Results  Labs (all labs ordered are listed, but only abnormal results are displayed) Labs Reviewed - No data to display  EKG   Radiology DG Foot Complete Left  Result Date: 02/17/2022 CLINICAL DATA:  Great toe and second toe pain.  Blister EXAM: LEFT FOOT - COMPLETE 3+ VIEW COMPARISON:  10/14/2017 FINDINGS: There is no evidence of fracture or dislocation. No erosion or periosteal elevation. Minimal degenerative changes of the foot. Bidirectional calcaneal enthesophytes. Mild generalized soft tissue prominence. No soft tissue gas. IMPRESSION: 1. No acute osseous abnormality of the left foot. 2. Mild generalized soft tissue prominence. No soft tissue gas. Electronically Signed   By: Davina Poke D.O.   On: 02/17/2022 16:47    Procedures Procedures (including critical care time)  Medications Ordered in UC Medications - No data to display  Initial Impression / Assessment and Plan / UC  Course  I have reviewed the triage vital signs and the nursing notes.  Pertinent labs & imaging results that were available during my care of the patient were reviewed by me and considered in my medical decision making (see chart for details).   Left foot x-ray today does not show any acute bony abnormality.  Suspect she has developed cellulitis secondary to blister rupture.  Discussed wound care.  We will treat for cellulitis of left toe with Keflex 4 times daily for 5 days.  Encouraged her not to pop blisters in the future.  Seek care if symptoms persist or worsen despite treatment. Final Clinical Impressions(s) / UC Diagnoses   Final diagnoses:  Pain of toe of left foot  Cellulitis of left toe   Discharge Instructions   None    ED Prescriptions     Medication Sig Dispense Auth. Provider   cephALEXin (KEFLEX) 500 MG capsule Take 1 capsule (500 mg total) by mouth 4 (four) times daily for 5 days. 20 capsule Eulogio Bear, NP      PDMP not reviewed this encounter.   Eulogio Bear, NP 02/17/22 1700

## 2022-03-11 ENCOUNTER — Ambulatory Visit
Admission: EM | Admit: 2022-03-11 | Discharge: 2022-03-11 | Disposition: A | Discharge status: Home / Self Care | Payer: 59

## 2022-03-11 DIAGNOSIS — H66003 Acute suppurative otitis media without spontaneous rupture of ear drum, bilateral: Secondary | ICD-10-CM | POA: Diagnosis not present

## 2022-03-11 DIAGNOSIS — J441 Chronic obstructive pulmonary disease with (acute) exacerbation: Secondary | ICD-10-CM | POA: Diagnosis not present

## 2022-03-11 MED ORDER — CEFUROXIME AXETIL 500 MG PO TABS: 500.0000 mg | ORAL_TABLET | Freq: Two times a day (BID) | ORAL | 0 refills | Status: AC

## 2022-03-11 MED ORDER — PREDNISONE 20 MG PO TABS: 40.0000 mg | ORAL_TABLET | Freq: Every day | ORAL | 0 refills | Status: AC

## 2022-03-11 MED ORDER — DEXAMETHASONE SODIUM PHOSPHATE 10 MG/ML IJ SOLN
10.0000 mg | Freq: Once | INTRAMUSCULAR | Status: AC
  Administered 2022-03-11: 10 mg via INTRAMUSCULAR

## 2022-03-11 NOTE — ED Triage Notes (Signed)
Pt reports congestion, cough, shortness of breath x 5 days. States symptoms are worse at night. Nebulizer and Symbicort inhaler gives some relief.

## 2022-03-11 NOTE — ED Provider Notes (Signed)
[facility]    CSN: [id number] Arrival date & time: 03/11/22  1105      History   Chief Complaint Chief Complaint  Patient presents with   Shortness of Breath   Cough   Nasal Congestion    HPI [name] is a 56 y.o. female.   Patient presents today with a few days of cough, congestion, ear pain, shortness of breath, wheezing.  She denies fevers, body aches, chills.  She is not having any chest pain or chest tightness, although it is little bit painful when she coughs.  Denies any drainage from her ears, abdominal pain, nausea/vomiting, diarrhea, new rash on her skin, and fatigue.  She has taken over-the-counter Alka-Seltzer for her symptoms and has been using her rescue inhaler "too much".  Medical history significant for COPD, hypothyroidism, hypertension.  She reports compliance with her medications.    Past Medical History:  Diagnosis Date   Arthritis    Back pain 10/30/2015   Bilateral shoulder pain 10/30/2015   COPD (chronic obstructive pulmonary disease) (HCC)    Fecal occult blood test positive 10/30/2015   Hemorrhoids 10/30/2015   Hypertension    Hypothyroidism    Large breasts 10/30/2015   44 DD   Postmenopausal 10/30/2015   Sleep apnea    pt DX but did not want to use CPAP.   Thyroid disease     Patient Active Problem List   Diagnosis Date Noted   Screening for colorectal cancer 12/07/2018   Encounter for gynecological examination with Papanicolaou smear of cervix 12/07/2018   Lichen planus 12/07/2018   Primary osteoarthritis of both hands 12/04/2017   Primary osteoarthritis of right knee 12/04/2017   Primary osteoarthritis of both feet 12/04/2017   History of hypothyroidism 12/04/2017   Dyslipidemia 12/04/2017   Status post total left knee replacement 01/29/17 10/14/2017   Special screening for malignant neoplasms, colon    Hemorrhoids 10/30/2015   Fecal occult blood test positive 10/30/2015   Postmenopausal 10/30/2015   Large breasts  10/30/2015   Bilateral shoulder pain 10/30/2015   Back pain 10/30/2015   Medial meniscus tear    Lateral meniscus tear, current    Erosive lichen planus of vulva 08/28/2014   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   Right hip flexor tightness 12/06/2013   Plantar fasciitis 12/06/2013    Past Surgical History:  Procedure Laterality Date   BREAST REDUCTION SURGERY Bilateral 04/17/2020   Procedure: MAMMARY REDUCTION  (BREAST);  Surgeon: Allena Napoleon, MD;  Location: [facility];  Service: Plastics;  Laterality: Bilateral;  2.5 hours, please   COLONOSCOPY N/A 11/12/2015   Procedure: COLONOSCOPY;  Surgeon: West Bali, MD;  Location: [facility];  Service: Endoscopy;  Laterality: N/A;  8:30 AM   JOINT REPLACEMENT     KNEE ARTHROSCOPY WITH MEDIAL MENISECTOMY Left 05/04/2015   Procedure: KNEE ARTHROSCOPY WITH MEDIAL AND LATERAL MENISECTOMY;  Surgeon: Vickki Hearing, MD;  Location: [facility];  Service: Orthopedics;  Laterality: Left;   NASAL SINUS SURGERY     ROTATOR CUFF REPAIR     SHOULDER ARTHROSCOPY Right 10/12/2018   right rotator cuff repair    SHOULDER SURGERY Right 10/12/2018   rotator cuff repair    TOTAL KNEE ARTHROPLASTY Left 01/29/2017   Procedure: TOTAL KNEE ARTHROPLASTY;  Surgeon: Vickki Hearing, MD;  Location: [facility];  Service: Orthopedics;  Laterality: Left;    OB History     Gravida  0   Para  0  Term  0   Preterm  0   AB  0   Living  0      SAB  0   IAB  0   Ectopic  0   Multiple  0   Live Births               Home Medications    Prior to Admission medications   Medication Sig Start Date End Date Taking? Authorizing Provider  albuterol (ACCUNEB) 0.63 MG/3ML nebulizer solution Take 1 ampule by nebulization every 6 (six) hours as needed for wheezing.   Yes [provider]  cefUROXime (CEFTIN) 500 MG tablet Take 1 tablet (500 mg total) by mouth 2 (two) times daily with a meal for 7 days. 03/11/22 03/18/22 Yes Valentino Nose, NP  predniSONE (DELTASONE) 20 MG tablet Take 2 tablets (40 mg total) by mouth daily with breakfast for 5 days. 03/11/22 03/16/22 Yes Valentino Nose, NP  Acetaminophen (TYLENOL PO) Take by mouth as needed.     [provider]  budesonide-formoterol (SYMBICORT) 80-4.5 MCG/ACT inhaler Inhale 2 puffs into the lungs 2 (two) times daily.    [provider]  buPROPion (WELLBUTRIN XL) 150 MG 24 hr tablet Take 150 mg by mouth daily. 03/12/20   [provider]  Cholecalciferol (VITAMIN D PO) Take by mouth daily.    [provider]  diclofenac (VOLTAREN) 75 MG EC tablet Take 75 mg by mouth 2 (two) times daily.    [provider]  escitalopram (LEXAPRO) 20 MG tablet Take 25 mg by mouth daily.    [provider]  hydrochlorothiazide (HYDRODIURIL) 25 MG tablet Take 25 mg by mouth daily.     [provider]  levothyroxine (SYNTHROID, LEVOTHROID) 25 MCG tablet Take 25 mcg by mouth daily before breakfast.    [provider]  MAGNESIUM PO Take by mouth daily.    [provider]  Omega-3 Fatty Acids (FISH OIL PO) Take by mouth daily.    [provider]    Family History Family History  Problem Relation Age of Onset   Alzheimer's disease Mother    Cancer Father        brain tumor   Diabetes Brother    Heart disease Brother    Heart attack Brother    Cancer Brother        melanoma   Lupus Maternal Grandmother    Heart disease Maternal Grandmother    Heart disease Maternal Grandfather    Hypertension Maternal Grandfather    Breast cancer Maternal Aunt        unsure of age    Breast cancer Cousin     Social History Social History   Tobacco Use   Smoking status: Never   Smokeless tobacco: Never  Vaping Use   Vaping Use: Never used  Substance Use Topics   Alcohol use: Yes    Comment: rare   Drug use: No     Allergies   Penicillins   Review of Systems Review of Systems Per  HPI  Physical Exam Triage Vital Signs ED Triage Vitals  Enc Vitals Group     BP 03/11/22 1113 (!) 156/91     Pulse Rate 03/11/22 1113 87     Resp 03/11/22 1113 20     Temp 03/11/22 1113 98.1 F (36.7 C)     Temp Source 03/11/22 1113 Oral     SpO2 03/11/22 1113 96 %     Weight --  Height --      Head Circumference --      Peak Flow --      Pain Score 03/11/22 1115 0     Pain Loc --      Pain Edu? --      Excl. in GC? --    No data found.  Updated Vital Signs BP (!) 156/91 (BP Location: Right Arm)   Pulse 87   Temp 98.1 F (36.7 C) (Oral)   Resp 20   LMP 04/14/2011   SpO2 96%   Visual Acuity Right Eye Distance:   Left Eye Distance:   Bilateral Distance:    Right Eye Near:   Left Eye Near:    Bilateral Near:     Physical Exam Vitals and nursing note reviewed.  Constitutional:      General: She is not in acute distress.    Appearance: Normal appearance. She is not ill-appearing or toxic-appearing.  HENT:     Head: Normocephalic and atraumatic.     Right Ear: Ear canal and external ear normal. Tenderness present. A middle ear effusion is present. Tympanic membrane is injected, erythematous and bulging. Tympanic membrane is not perforated.     Left Ear: Ear canal and external ear normal. Tenderness present. A middle ear effusion is present. Tympanic membrane is injected, erythematous and bulging. Tympanic membrane is not perforated.     Nose: Congestion and rhinorrhea present.     Mouth/Throat:     Mouth: Mucous membranes are moist.     Pharynx: Oropharynx is clear. Posterior oropharyngeal erythema present. No oropharyngeal exudate.  Eyes:     General: No scleral icterus.    Extraocular Movements: Extraocular movements intact.  Cardiovascular:     Rate and Rhythm: Normal rate and regular rhythm.  Pulmonary:     Effort: Pulmonary effort is normal. No respiratory distress.     Breath sounds: Examination of the right-upper field reveals wheezing. Examination  of the left-upper field reveals wheezing. Examination of the right-middle field reveals wheezing. Examination of the left-middle field reveals wheezing. Examination of the right-lower field reveals wheezing. Examination of the left-lower field reveals wheezing. Wheezing present. No rhonchi or rales.  Abdominal:     General: Abdomen is flat. Bowel sounds are normal. There is no distension.     Palpations: Abdomen is soft.  Musculoskeletal:     Cervical back: Normal range of motion and neck supple.  Lymphadenopathy:     Cervical: No cervical adenopathy.  Skin:    General: Skin is warm and dry.     Coloration: Skin is not jaundiced or pale.     Findings: No erythema or rash.  Neurological:     Mental Status: She is alert and oriented to person, place, and time.     Motor: No weakness.  Psychiatric:        Mood and Affect: Mood normal.        Behavior: Behavior normal.      UC Treatments / Results  Labs (all labs ordered are listed, but only abnormal results are displayed) Labs Reviewed - No data to display  EKG   Radiology No results found.  Procedures Procedures (including critical care time)  Medications Ordered in UC Medications  dexamethasone (DECADRON) injection 10 mg (10 mg Intramuscular Given 03/11/22 1141)    Initial Impression / Assessment and Plan / UC Course  I have reviewed the triage vital signs and the nursing notes.  Pertinent labs & imaging results that were  available during my care of the patient were reviewed by me and considered in my medical decision making (see chart for details).    Patient is a well-appearing, very pleasant 56 year old female with a COPD exacerbation and otitis media of both ears.  Vital signs are reassuring today.  Given significant wheezing despite albuterol use at home, we will treat with Decadron 10 mg IM today.  Start prednisone 40 mg for 5 days tomorrow.  Start Ceftin twice daily for 7 days for the ear infection and to treat the  COPD exacerbation.  If symptoms worsen, go to emergency room.  Final Clinical Impressions(s) / UC Diagnoses   Final diagnoses:  Non-recurrent acute suppurative otitis media of both ears without spontaneous rupture of tympanic membranes  COPD exacerbation (HCC)     Discharge Instructions      - We have given you a shot of decadron today to help with the inflammation in your lungs - Please take the Ceftin twice daily for 7 days for the ear infection and to treat the COPD exacerbation - Start prednisone 40 mg daily for 5 days tomorrow - Continue your inhaler regimen and nebulizer treatment every 4-6 hours as needed for wheezing or shortness of breath   ED Prescriptions     Medication Sig Dispense Auth. Provider   cefUROXime (CEFTIN) 500 MG tablet Take 1 tablet (500 mg total) by mouth 2 (two) times daily with a meal for 7 days. 14 tablet Cathlean Marseilles A, NP   predniSONE (DELTASONE) 20 MG tablet Take 2 tablets (40 mg total) by mouth daily with breakfast for 5 days. 10 tablet Valentino Nose, NP      PDMP not reviewed this encounter.   Valentino Nose, NP 03/11/22 1253

## 2022-03-11 NOTE — Discharge Instructions (Signed)
-   We have given you a shot of decadron today to help with the inflammation in your lungs - Please take the Ceftin twice daily for 7 days for the ear infection and to treat the COPD exacerbation - Start prednisone 40 mg daily for 5 days tomorrow - Continue your inhaler regimen and nebulizer treatment every 4-6 hours as needed for wheezing or shortness of breath

## 2022-03-31 ENCOUNTER — Other Ambulatory Visit (HOSPITAL_COMMUNITY): Payer: Self-pay | Admitting: Family Medicine

## 2022-03-31 DIAGNOSIS — Z1231 Encounter for screening mammogram for malignant neoplasm of breast: Secondary | ICD-10-CM

## 2022-04-28 ENCOUNTER — Ambulatory Visit (HOSPITAL_COMMUNITY)
Admission: RE | Admit: 2022-04-28 | Discharge: 2022-04-28 | Disposition: A | Discharge status: Home / Self Care | Payer: 59 | Source: Ambulatory Visit | Attending: Family Medicine | Admitting: Family Medicine

## 2022-04-28 DIAGNOSIS — Z1231 Encounter for screening mammogram for malignant neoplasm of breast: Secondary | ICD-10-CM | POA: Diagnosis present

## 2022-05-13 ENCOUNTER — Emergency Department (HOSPITAL_COMMUNITY)
Admission: EM | Admit: 2022-05-13 | Discharge: 2022-05-13 | Disposition: A | Discharge status: Home / Self Care | Payer: 59 | Attending: Emergency Medicine | Admitting: Emergency Medicine

## 2022-05-13 ENCOUNTER — Encounter (HOSPITAL_COMMUNITY): Payer: Self-pay

## 2022-05-13 ENCOUNTER — Emergency Department (HOSPITAL_COMMUNITY): Payer: 59

## 2022-05-13 ENCOUNTER — Other Ambulatory Visit: Payer: Self-pay

## 2022-05-13 DIAGNOSIS — R1031 Right lower quadrant pain: Secondary | ICD-10-CM | POA: Insufficient documentation

## 2022-05-13 DIAGNOSIS — D72829 Elevated white blood cell count, unspecified: Secondary | ICD-10-CM | POA: Diagnosis not present

## 2022-05-13 DIAGNOSIS — J449 Chronic obstructive pulmonary disease, unspecified: Secondary | ICD-10-CM | POA: Diagnosis not present

## 2022-05-13 DIAGNOSIS — E039 Hypothyroidism, unspecified: Secondary | ICD-10-CM | POA: Insufficient documentation

## 2022-05-13 DIAGNOSIS — R109 Unspecified abdominal pain: Secondary | ICD-10-CM

## 2022-05-13 DIAGNOSIS — I10 Essential (primary) hypertension: Secondary | ICD-10-CM | POA: Insufficient documentation

## 2022-05-13 LAB — COMPREHENSIVE METABOLIC PANEL
ALT: 23 U/L (ref 0–44)
AST: 25 U/L (ref 15–41)
Albumin: 3.3 g/dL — ABNORMAL LOW (ref 3.5–5.0)
Alkaline Phosphatase: 83 U/L (ref 38–126)
Anion gap: 6 (ref 5–15)
BUN: 23 mg/dL — ABNORMAL HIGH (ref 6–20)
CO2: 29 mmol/L (ref 22–32)
Calcium: 8.1 mg/dL — ABNORMAL LOW (ref 8.9–10.3)
Chloride: 104 mmol/L (ref 98–111)
Creatinine, Ser: 0.82 mg/dL (ref 0.44–1.00)
GFR, Estimated: >60 mL/min (ref >60)
Glucose, Bld: 123 mg/dL — ABNORMAL HIGH (ref 70–99)
Potassium: 3.7 mmol/L (ref 3.5–5.1)
Sodium: 139 mmol/L (ref 135–145)
Total Bilirubin: 0.8 mg/dL (ref 0.3–1.2)
Total Protein: 6.4 g/dL — ABNORMAL LOW (ref 6.5–8.1)

## 2022-05-13 LAB — CBC WITH DIFFERENTIAL/PLATELET
Abs Immature Granulocytes: 0.16 10*3/uL — ABNORMAL HIGH (ref 0.00–0.07)
Basophils Absolute: 0.1 10*3/uL (ref 0.0–0.1)
Basophils Relative: 1 %
Eosinophils Absolute: 0 10*3/uL (ref 0.0–0.5)
Eosinophils Relative: 0 %
HCT: 37.2 % (ref 36.0–46.0)
Hemoglobin: 12.5 g/dL (ref 12.0–15.0)
Immature Granulocytes: 1 %
Lymphocytes Relative: 24 %
Lymphs Abs: 2.8 10*3/uL (ref 0.7–4.0)
MCH: 29.2 pg (ref 26.0–34.0)
MCHC: 33.6 g/dL (ref 30.0–36.0)
MCV: 86.9 fL (ref 80.0–100.0)
Monocytes Absolute: 1.2 10*3/uL — ABNORMAL HIGH (ref 0.1–1.0)
Monocytes Relative: 10 %
Neutro Abs: 7.5 10*3/uL (ref 1.7–7.7)
Neutrophils Relative %: 64 %
Platelets: 201 10*3/uL (ref 150–400)
RBC: 4.28 MIL/uL (ref 3.87–5.11)
RDW: 14.5 % (ref 11.5–15.5)
WBC: 11.8 10*3/uL — ABNORMAL HIGH (ref 4.0–10.5)
nRBC: 0 % (ref 0.0–0.2)

## 2022-05-13 LAB — URINALYSIS, ROUTINE W REFLEX MICROSCOPIC
Bacteria, UA: NONE SEEN
Bilirubin Urine: NEGATIVE
Glucose, UA: NEGATIVE mg/dL
Hgb urine dipstick: NEGATIVE
Ketones, ur: 5 mg/dL — AB
Leukocytes,Ua: NEGATIVE
Nitrite: NEGATIVE
Protein, ur: 30 mg/dL — AB
Specific Gravity, Urine: 1.031 — ABNORMAL HIGH (ref 1.005–1.030)
pH: 6 (ref 5.0–8.0)

## 2022-05-13 LAB — LIPASE, BLOOD: Lipase: 28 U/L (ref 11–51)

## 2022-05-13 MED ORDER — DICYCLOMINE HCL 20 MG PO TABS: 20.0000 mg | ORAL_TABLET | Freq: Three times a day (TID) | ORAL | 0 refills | Status: DC | PRN

## 2022-05-13 MED ORDER — ONDANSETRON 4 MG PO TBDP: 4.0000 mg | ORAL_TABLET | Freq: Three times a day (TID) | ORAL | 0 refills | Status: DC | PRN

## 2022-05-13 MED ORDER — MORPHINE SULFATE (PF) 4 MG/ML IV SOLN
4.0000 mg | Freq: Once | INTRAVENOUS | Status: AC
  Administered 2022-05-13: 4 mg via INTRAVENOUS
  Filled 2022-05-13: qty 1

## 2022-05-13 MED ORDER — ONDANSETRON HCL 4 MG/2ML IJ SOLN
4.0000 mg | Freq: Once | INTRAMUSCULAR | Status: AC
  Administered 2022-05-13: 4 mg via INTRAVENOUS
  Filled 2022-05-13: qty 2

## 2022-05-13 MED ORDER — SENNOSIDES-DOCUSATE SODIUM 8.6-50 MG PO TABS: 1.0000 | ORAL_TABLET | Freq: Every evening | ORAL | 0 refills | Status: DC | PRN

## 2022-05-13 MED ORDER — OXYCODONE-ACETAMINOPHEN 5-325 MG PO TABS: 1.0000 | ORAL_TABLET | Freq: Four times a day (QID) | ORAL | 0 refills | Status: DC | PRN

## 2022-05-13 MED ORDER — IOHEXOL 300 MG/ML  SOLN
100.0000 mL | Freq: Once | INTRAMUSCULAR | Status: AC | PRN
  Administered 2022-05-13: 100 mL via INTRAVENOUS

## 2022-05-13 MED ORDER — SODIUM CHLORIDE 0.9 % IV BOLUS
500.0000 mL | Freq: Once | INTRAVENOUS | Status: AC
Start: 2022-05-13 — End: 2022-05-13
  Administered 2022-05-13: 500 mL via INTRAVENOUS

## 2022-05-13 NOTE — ED Triage Notes (Signed)
Pt c/o right flank pain that has gotten progressively worse over last week.

## 2022-05-13 NOTE — Discharge Instructions (Signed)

## 2022-05-13 NOTE — ED Provider Notes (Signed)
Emergency Department Provider Note   I have reviewed the triage vital signs and the nursing notes.   HISTORY  Chief Complaint Flank Pain   HPI Sunday Klos is a 56 y.o. female with past history reviewed below including COPD presents to the emergency department with right sided abdominal pain with associated nausea.  Symptoms have been worsening over the past 2 to 3 days.  She was seen by her PCP who suspected MSK etiology for symptoms and she was discharged home with steroid and muscle relaxer which she has been taking without relief.  She has not noticed a particular association with food.  No fevers or chills.  No diarrhea.  No discomfort into the chest or shortness of breath.  No similar pain to this in the past.  No prior abdominal surgery history.   Past Medical History:  Diagnosis Date   Arthritis    Back pain 10/30/2015   Bilateral shoulder pain 10/30/2015   COPD (chronic obstructive pulmonary disease) (HCC)    Fecal occult blood test positive 10/30/2015   Hemorrhoids 10/30/2015   Hypertension    Hypothyroidism    Large breasts 10/30/2015   44 DD   Postmenopausal 10/30/2015   Sleep apnea    pt DX but did not want to use CPAP.   Thyroid disease     Review of Systems  Constitutional: No fever/chills Eyes: No visual changes. ENT: No sore throat. Cardiovascular: Denies chest pain. Respiratory: Denies shortness of breath. Gastrointestinal: Positive abdominal pain. Positive nausea, no vomiting.  No diarrhea.  No constipation. Genitourinary: Negative for dysuria. Musculoskeletal: Negative for back pain. Skin: Negative for rash. Neurological: Negative for headaches, focal weakness or numbness.   ____________________________________________   PHYSICAL EXAM:  VITAL SIGNS: ED Triage Vitals  Enc Vitals Group     BP 05/13/22 0300 139/83     Pulse Rate 05/13/22 0300 82     Resp 05/13/22 0300 18     Temp 05/13/22 0300 97.8 F (36.6 C)     Temp Source 05/13/22 0300  Oral     SpO2 05/13/22 0300 99 %     Weight 05/13/22 0259 235 lb (106.6 kg)     Height 05/13/22 0259 5\' 3"  (1.6 m)   Constitutional: Alert and oriented. Laying very still and appearing uncomfortable at times.  Eyes: Conjunctivae are normal.  Head: Atraumatic. Nose: No congestion/rhinnorhea. Mouth/Throat: Mucous membranes are moist.   Neck: No stridor.  Cardiovascular: Normal rate, regular rhythm. Good peripheral circulation. Grossly normal heart sounds.   Respiratory: Normal respiratory effort.  No retractions. Lungs CTAB. Gastrointestinal: Soft with focal right sided abdominal tenderness slightly worse in the RUQ. No distention.  Musculoskeletal: No lower extremity tenderness nor edema. No gross deformities of extremities. Neurologic:  Normal speech and language. No gross focal neurologic deficits are appreciated.  Skin:  Skin is warm, dry and intact. No rash noted.  ____________________________________________   LABS (all labs ordered are listed, but only abnormal results are displayed)  Labs Reviewed  URINALYSIS, ROUTINE W REFLEX MICROSCOPIC - Abnormal; Notable for the following components:      Result Value   Color, Urine AMBER (*)    APPearance CLOUDY (*)    Specific Gravity, Urine 1.031 (*)    Ketones, ur 5 (*)    Protein, ur 30 (*)    All other components within normal limits  COMPREHENSIVE METABOLIC PANEL - Abnormal; Notable for the following components:   Glucose, Bld 123 (*)    BUN 23 (*)  Calcium 8.1 (*)    Total Protein 6.4 (*)    Albumin 3.3 (*)    All other components within normal limits  CBC WITH DIFFERENTIAL/PLATELET - Abnormal; Notable for the following components:   WBC 11.8 (*)    Monocytes Absolute 1.2 (*)    Abs Immature Granulocytes 0.16 (*)    All other components within normal limits  LIPASE, BLOOD  POC URINE PREG, ED   ____________________________________________  RADIOLOGY  CT ABDOMEN PELVIS W CONTRAST  Result Date: 05/13/2022 CLINICAL  DATA:  56 year old female with history of right lower quadrant abdominal pain progressively worsening over the past week. EXAM: CT ABDOMEN AND PELVIS WITH CONTRAST TECHNIQUE: Multidetector CT imaging of the abdomen and pelvis was performed using the standard protocol following bolus administration of intravenous contrast. RADIATION DOSE REDUCTION: This exam was performed according to the departmental dose-optimization program which includes automated exposure control, adjustment of the mA and/or kV according to patient size and/or use of iterative reconstruction technique. CONTRAST:  OMNIPAQUE IOHEXOL 300 MG/ML  SOLN COMPARISON:  CT of the abdomen and pelvis 06/11/2012. FINDINGS: Lower chest: Unremarkable. Hepatobiliary: No suspicious cystic or solid hepatic lesions are noted. No intra or extrahepatic biliary ductal dilatation. Gallbladder is normal in appearance. Pancreas: No pancreatic mass. No pancreatic ductal dilatation. No pancreatic or peripancreatic fluid collections or inflammatory changes. Spleen: Unremarkable. Adrenals/Urinary Tract: 4 mm nonobstructive calculus in the lower pole collecting system of the right kidney. Bilateral kidneys and adrenal glands are otherwise normal in appearance. No hydroureteronephrosis. Urinary bladder is nearly completely decompressed, but otherwise unremarkable in appearance. Stomach/Bowel: The appearance of the stomach is normal. There is no pathologic dilatation of small bowel or colon. Normal appendix. Vascular/Lymphatic: Atherosclerosis in the abdominal and pelvic vasculature, without evidence of aneurysm or dissection. No lymphadenopathy noted in the abdomen or pelvis. Reproductive: Uterus and ovaries are unremarkable in appearance. Other: No significant volume of ascites.  No pneumoperitoneum. Musculoskeletal: There are no aggressive appearing lytic or blastic lesions noted in the visualized portions of the skeleton. IMPRESSION: 1. No acute findings are noted in  the abdomen or pelvis to account for the patient's symptoms. 2. There is, however, a 4 mm nonobstructive calculus in the lower pole collecting system of the right kidney. No ureteral stones or findings of urinary tract obstruction are noted at this time. 3. Aortic atherosclerosis. Electronically Signed   By: Trudie Reed M.D.   On: 05/13/2022 05:32    ____________________________________________   PROCEDURES  Procedure(s) performed:   Procedures  None  ____________________________________________   INITIAL IMPRESSION / ASSESSMENT AND PLAN / ED COURSE  Pertinent labs & imaging results that were available during my care of the patient were reviewed by me and considered in my medical decision making (see chart for details).   This patient is Presenting for Evaluation of abdominal pain, which does require a range of treatment options, and is a complaint that involves a high risk of morbidity and mortality.  The Differential Diagnoses includes but is not exclusive to acute cholecystitis, intrathoracic causes for epigastric abdominal pain, gastritis, duodenitis, pancreatitis, small bowel or large bowel obstruction, abdominal aortic aneurysm, hernia, gastritis, etc.   Critical Interventions-    Medications  sodium chloride 0.9 % bolus 500 mL (0 mLs Intravenous Stopped 05/13/22 0455)  morphine (PF) 4 MG/ML injection 4 mg (4 mg Intravenous Given 05/13/22 0350)  ondansetron (ZOFRAN) injection 4 mg (4 mg Intravenous Given 05/13/22 0350)  iohexol (OMNIPAQUE) 300 MG/ML solution 100 mL (100 mLs Intravenous  Contrast Given 05/13/22 0514)    Reassessment after intervention: Symptoms improved.    I did obtain Additional Historical Information from partner at bedside.  I decided to review pertinent External Data, and in summary no prior abdominal surgery history on file.    Clinical Laboratory Tests Ordered, included no evidence of urinary tract infection.  Mild leukocytosis to 11.8 without  anemia.  No acute kidney injury.  LFTs and bilirubin are normal along with normal lipase.  Radiologic Tests Ordered, included CT abdomen/pelvis. I independently interpreted the images and agree with radiology interpretation.   Cardiac Monitor Tracing which shows NSR.   Social Determinants of Health Risk patient is a non-smoker.   Medical Decision Making: Summary:  Patient presents to the emergency department for evaluation of abdominal pain worse in the right abdomen.  Focal tenderness on exam as above.  Plan for CT imaging along with screening blood work and reassess after pain management.  Reevaluation with update and discussion with patient.  CT imaging reassuring.  There is a kidney stone without evidence of obstruction.  Doubt this is the etiology of pain.  Plan for pain management at home with close PCP follow-up.  Discussed tricked ED return precautions.  Patient is up and ambulatory here and has a ride home.   Disposition: discharge  ____________________________________________  FINAL CLINICAL IMPRESSION(S) / ED DIAGNOSES  Final diagnoses:  Right flank pain     NEW OUTPATIENT MEDICATIONS STARTED DURING THIS VISIT:  Discharge Medication List as of 05/13/2022  6:05 AM     START taking these medications   Details  dicyclomine (BENTYL) 20 MG tablet Take 1 tablet (20 mg total) by mouth 3 (three) times daily as needed for spasms (abdominal cramping)., Starting Tue 05/13/2022, Normal    ondansetron (ZOFRAN-ODT) 4 MG disintegrating tablet Take 1 tablet (4 mg total) by mouth every 8 (eight) hours as needed for nausea or vomiting., Starting Tue 05/13/2022, Normal    oxyCODONE-acetaminophen (PERCOCET/ROXICET) 5-325 MG tablet Take 1 tablet by mouth every 6 (six) hours as needed for severe pain., Starting Tue 05/13/2022, Normal    senna-docusate (SENOKOT-S) 8.6-50 MG tablet Take 1 tablet by mouth at bedtime as needed for mild constipation or moderate constipation., Starting Tue  05/13/2022, Normal        Note:  This document was prepared using Dragon voice recognition software and may include unintentional dictation errors.  Alona Bene, MD, [facility] Emergency Medicine    Kyrielle Urbanski, Arlyss Repress, MD 05/13/22 [phone]

## 2022-06-05 ENCOUNTER — Other Ambulatory Visit: Payer: Self-pay

## 2022-06-05 ENCOUNTER — Emergency Department (HOSPITAL_COMMUNITY)
Admission: EM | Admit: 2022-06-05 | Discharge: 2022-06-05 | Discharge status: Against Medical Advice | Payer: 59 | Attending: Emergency Medicine | Admitting: Emergency Medicine

## 2022-06-05 ENCOUNTER — Encounter (HOSPITAL_COMMUNITY): Payer: Self-pay | Admitting: *Deleted

## 2022-06-05 DIAGNOSIS — R109 Unspecified abdominal pain: Secondary | ICD-10-CM | POA: Diagnosis not present

## 2022-06-05 DIAGNOSIS — R1011 Right upper quadrant pain: Secondary | ICD-10-CM | POA: Insufficient documentation

## 2022-06-05 DIAGNOSIS — Z5321 Procedure and treatment not carried out due to patient leaving prior to being seen by health care provider: Secondary | ICD-10-CM | POA: Insufficient documentation

## 2022-06-05 NOTE — ED Triage Notes (Signed)
Pt c/o RUQ abdominal pain over the last few days. Denies fever, n/v/d.

## 2022-07-10 ENCOUNTER — Encounter: Payer: Self-pay | Admitting: Orthopedic Surgery

## 2022-07-10 ENCOUNTER — Ambulatory Visit: Payer: 59 | Admitting: Orthopedic Surgery

## 2022-07-10 VITALS — BP 118/73 | HR 77 | Ht 62.0 in | Wt 234.0 lb

## 2022-07-10 DIAGNOSIS — M25561 Pain in right knee: Secondary | ICD-10-CM | POA: Diagnosis not present

## 2022-07-10 DIAGNOSIS — G8929 Other chronic pain: Secondary | ICD-10-CM

## 2022-07-10 DIAGNOSIS — M545 Low back pain, unspecified: Secondary | ICD-10-CM

## 2022-07-10 MED ORDER — METHYLPREDNISOLONE ACETATE 40 MG/ML IJ SUSP
40.0000 mg | Freq: Once | INTRAMUSCULAR | Status: AC
  Administered 2022-07-10: 40 mg via INTRA_ARTICULAR

## 2022-07-10 NOTE — Progress Notes (Signed)
Established patient  Chief Complaint  Patient presents with   Joint Swelling    Swelling in legs, L> R    She complains of right hip pain radiating to her right knee she also has chronic osteoarthritis of the right knee with medial joint pain, worse over the last month.  She says she cannot walk for long periods of time or the hip leg and knee pain will increase  Exam of the right knee she has excellent range of motion still 0 to 120 degrees she has some mild crepitance tenderness medial joint line lateral joint line and patellofemoral regions have mild tenderness  She has tenderness in the lower back  I cannot reproduce the pain she is having with maneuvering the knee  Recommend injection right knee  Recommend physical therapy lumbar spine  Return in 6 weeks if no improvement MRI spine  Encounter Diagnoses  Name Primary?   Chronic pain of right knee Yes   Lumbar pain    Procedure note right knee injection   verbal consent was obtained to inject right knee joint  Timeout was completed to confirm the site of injection  The medications used were depomedrol 40 mg and 1% lidocaine 3 cc Anesthesia was provided by ethyl chloride and the skin was prepped with alcohol.  After cleaning the skin with alcohol a 20-gauge needle was used to inject the right knee joint. There were no complications. A sterile bandage was applied.

## 2022-07-10 NOTE — Patient Instructions (Signed)

## 2022-08-11 NOTE — Therapy (Unsigned)
OUTPATIENT PHYSICAL THERAPY THORACOLUMBAR EVALUATION   Patient Name: [name] MRN: [id number] DOB:10-27-1965, 56 y.o., female Today's Date: 08/12/2022  END OF SESSION:  PT End of Session - 08/12/22 1655     Visit Number 1    Number of Visits 12    Date for PT Re-Evaluation 09/23/22    Authorization Type [employer]    Progress Note Due on Visit 10    PT Start Time 1518    PT Stop Time 1600    PT Time Calculation (min) 42 min    Activity Tolerance Patient tolerated treatment well    Behavior During Therapy [facility] for tasks assessed/performed             Past Medical History:  Diagnosis Date   Arthritis    Back pain 10/30/2015   Bilateral shoulder pain 10/30/2015   COPD (chronic obstructive pulmonary disease) (HCC)    Fecal occult blood test positive 10/30/2015   Hemorrhoids 10/30/2015   Hypertension    Hypothyroidism    Large breasts 10/30/2015   44 DD   Postmenopausal 10/30/2015   Sleep apnea    pt DX but did not want to use CPAP.   Thyroid disease    Past Surgical History:  Procedure Laterality Date   BREAST REDUCTION SURGERY Bilateral 04/17/2020   Procedure: MAMMARY REDUCTION  (BREAST);  Surgeon: Allena Napoleon, MD;  Location: [facility];  Service: Plastics;  Laterality: Bilateral;  2.5 hours, please   COLONOSCOPY N/A 11/12/2015   Procedure: COLONOSCOPY;  Surgeon: West Bali, MD;  [facility];  Service: Endoscopy;  Laterality: N/A;  8:30 AM   JOINT REPLACEMENT     KNEE ARTHROSCOPY WITH MEDIAL MENISECTOMY Left 05/04/2015   Procedure: KNEE ARTHROSCOPY WITH MEDIAL AND LATERAL MENISECTOMY;  Surgeon: Vickki Hearing, MD;  Location: [facility];  Service: Orthopedics;  Laterality: Left;   NASAL SINUS SURGERY     ROTATOR CUFF REPAIR     SHOULDER ARTHROSCOPY Right 10/12/2018   right rotator cuff repair    SHOULDER SURGERY Right 10/12/2018   rotator cuff repair    TOTAL KNEE ARTHROPLASTY Left 01/29/2017   Procedure: TOTAL KNEE ARTHROPLASTY;   Surgeon: Vickki Hearing, MD;  Location: [facility];  Service: Orthopedics;  Laterality: Left;   Patient Active Problem List   Diagnosis Date Noted   Screening for colorectal cancer 12/07/2018   Encounter for gynecological examination with Papanicolaou smear of cervix 12/07/2018   Lichen planus 12/07/2018   Primary osteoarthritis of both hands 12/04/2017   Primary osteoarthritis of right knee 12/04/2017   Primary osteoarthritis of both feet 12/04/2017   History of hypothyroidism 12/04/2017   Dyslipidemia 12/04/2017   Status post total left knee replacement 01/29/17 10/14/2017   Special screening for malignant neoplasms, colon    Hemorrhoids 10/30/2015   Fecal occult blood test positive 10/30/2015   Postmenopausal 10/30/2015   Large breasts 10/30/2015   Bilateral shoulder pain 10/30/2015   Back pain 10/30/2015   Medial meniscus tear    Lateral meniscus tear, current    Erosive lichen planus of vulva 08/28/2014   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   Right hip flexor tightness 12/06/2013   Plantar fasciitis 12/06/2013    PCP: Assunta Found   REFERRING PROVIDER: [name]   REFERRING DIAG: M54.50 (ICD-10-CM) - Lumbar pain  Rationale for Evaluation and Treatment: Rehabilitation  Rationale for Evaluation and Treatment: Rehabilitation  THERAPY DIAG:  Lumbar radiculopathy Muscle weakness   ONSET DATE: chronic  SUBJECTIVE:                                                                                                                                                                                           SUBJECTIVE STATEMENT: Pt states that she has been having low back pain for years.  If she walks for a considerable distance, (greater than 10 minutes) she will have pain that radiates to her knee level.  If she rests the pain will go away.  Stand:  for 30 minutes; walking 10 minutes, sitting 30 minutes   PERTINENT HISTORY:  OA, Rt knee menisectomy; Lt TKR  PAIN:  Are you  having pain? Yes: Pain location: 5/10;  worst: 8/10; best:  0  Pain description: achy Aggravating factors: walking  Relieving factors: rest   PRECAUTIONS: None  WEIGHT BEARING RESTRICTIONS: No  FALLS:  Has patient fallen in last 6 months? No  LIVING ENVIRONMENT: Lives with: lives with their family Lives in: House/apartment Stairs: Yes: External: 5 steps; on right going up pt has to go up one step at a time  Has following equipment at home: None  OCCUPATION: [employer].   PLOF: Independent with basic ADLs  PATIENT GOALS: less pain   NEXT MD VISIT: unknown  OBJECTIVE:   DIAGNOSTIC FINDINGS:  None  PATIENT SURVEYS:  FOTO 57  COGNITION: Overall cognitive status: Within functional limits for tasks assessed     SENSATION: Not tested  POSTURE: No Significant postural limitations   LUMBAR ROM:   AROM eval  Flexion Fingers to  floor reps slightly worse    Extension 25 reps  slightly worse  Right lateral flexion   Left lateral flexion   Right rotation   Left rotation    (Blank rows = not tested)   LOWER EXTREMITY MMT:    MMT Right eval Left eval  Hip flexion 5 4  Hip extension 3 3-  Hip abduction 3+ 3  Hip adduction    Hip internal rotation    Hip external rotation    Knee flexion 4 4  Knee extension 5 5  Ankle dorsiflexion 5 5  Ankle plantarflexion    Ankle inversion    Ankle eversion     (Blank rows = not tested)   FUNCTIONAL TESTS:  30 seconds chair stand test:  8 in 30 seconds ; 10 is poor for pt age  50 minute walk test: 69' with significant increased pain.  Single leg stance: RT:  5"  , LT:   8"   TODAY'S TREATMENT:  DATE: Evaluation and HEP  Abdominal set x 10 Bridge x 10  Knee to chest x 2   PATIENT EDUCATION:  Education details: HEP the importance of posture and good body mechanics.  Person educated:  Patient Education method: Explanation Education comprehension: verbalized understanding  HOME EXERCISE PROGRAM: Access Code: [email or web address] URL: [email or web address] Date: 08/12/2022 Prepared by: Virgina Organ  Exercises - Supine Transversus Abdominis Bracing - Hands on Stomach  - 3 x daily - 7 x weekly - 1 sets - 10 reps - 5" hold - Supine Single Knee to Chest Stretch  - 2 x daily - 7 x weekly - 1 sets - 3 reps - 30" hold - Beginner Bridge  - 2 x daily - 7 x weekly - 1 sets - 10 reps - 5" hold  ASSESSMENT:  CLINICAL IMPRESSION: Patient is a 56 y.o. Female who was seen today for physical therapy evaluation and treatment for low back pain. Evaluation demonstrates decreased ROM, decreased strength, decreased balance, decreased activity tolerance and increased pain.  Ms. Sprowl will benefit from skilled PT to address these issues and maximize her functional ability.   OBJECTIVE IMPAIRMENTS: decreased activity tolerance, decreased balance, decreased ROM, decreased strength, impaired flexibility, improper body mechanics, postural dysfunction, obesity, and pain.   ACTIVITY LIMITATIONS: carrying, lifting, bending, standing, squatting, and locomotion level  PARTICIPATION LIMITATIONS: cleaning, laundry, shopping, and community activity  PERSONAL FACTORS: Fitness, Time since onset of injury/illness/exacerbation, and 1-2 comorbidities: OA, Lt TKR, RT menisectomy are also affecting patient's functional outcome.   REHAB POTENTIAL: Good  CLINICAL DECISION MAKING: Stable/uncomplicated  EVALUATION COMPLEXITY: Low   GOALS: Goals reviewed with patient? No  SHORT TERM GOALS: Target date 09/02/22:   Pt to be I in HEP in order to decrease pain to no greater than a 5/10 Baseline: Goal status: INITIAL   2.  Pt core and LE strength to be increased 1/2 grade to be able to come sit to stand from a low lying couch without difficulty Baseline:  Goal status: INITIAL  3.  Pt to  demonstrate good body mechanics for bed mobility and lifting.  Baseline:  Goal status: INITIAL    LONG TERM GOALS: Target date: 09/23/2022  Pt to be I in advanced HEP to decrease pain to no greater than a 3/10 to allow increased functional activity Baseline:  Goal status: INITIAL  2.  PT strength to be increased one grade in core and LE to allow pt to walk for 30 minutes without increased pain.  Baseline:  Goal status: INITIAL  3.  PT to no longer have radicular sx to demonstrate decreased nerve irritation.  Baseline:  Goal status: INITIAL    PLAN:  PT FREQUENCY: 2x/week  PT DURATION: 6 weeks  PLANNED INTERVENTIONS: Therapeutic exercises, Therapeutic activity, Neuromuscular re-education, Balance training, Patient/Family education, Self Care, and Manual therapy.  PLAN FOR NEXT SESSION: Continue progressing lumbar stabilization.  Virgina Organ, PT CLT [phone]

## 2022-08-12 ENCOUNTER — Ambulatory Visit (HOSPITAL_COMMUNITY): Payer: 59 | Attending: Orthopedic Surgery | Admitting: Physical Therapy

## 2022-08-12 DIAGNOSIS — M5459 Other low back pain: Secondary | ICD-10-CM | POA: Insufficient documentation

## 2022-08-12 DIAGNOSIS — M545 Low back pain, unspecified: Secondary | ICD-10-CM | POA: Insufficient documentation

## 2022-08-12 DIAGNOSIS — M6281 Muscle weakness (generalized): Secondary | ICD-10-CM | POA: Diagnosis not present

## 2022-08-21 ENCOUNTER — Ambulatory Visit: Payer: 59 | Admitting: Orthopedic Surgery

## 2022-08-27 ENCOUNTER — Ambulatory Visit (HOSPITAL_COMMUNITY): Payer: 59 | Attending: Orthopedic Surgery | Admitting: Physical Therapy

## 2022-08-27 DIAGNOSIS — M6281 Muscle weakness (generalized): Secondary | ICD-10-CM | POA: Insufficient documentation

## 2022-08-27 DIAGNOSIS — M5459 Other low back pain: Secondary | ICD-10-CM | POA: Insufficient documentation

## 2022-08-27 NOTE — Therapy (Signed)
OUTPATIENT PHYSICAL THERAPY TREATEMENT Patient Name: Connie SimonJoan Elizabeth Andrews MRN: [id number] DOB:08/31/1966, 56 y.o., female Today's Date: 08/27/2022  END OF SESSION:  PT End of Session - 08/27/22 1521     Visit Number 2    Number of Visits 12    Date for PT Re-Evaluation 09/23/22    Authorization Type [employer]    Progress Note Due on Visit 10    PT Start Time 1518    PT Stop Time 1558    PT Time Calculation (min) 40 min    Activity Tolerance Patient tolerated treatment well    Behavior [facility] for tasks assessed/performed             Past Medical History:  Diagnosis Date   Arthritis    Back pain 10/30/2015   Bilateral shoulder pain 10/30/2015   COPD (chronic obstructive pulmonary disease) (HCC)    Fecal occult blood test positive 10/30/2015   Hemorrhoids 10/30/2015   Hypertension    Hypothyroidism    Large breasts 10/30/2015   44 DD   Postmenopausal 10/30/2015   Sleep apnea    pt DX but did not want to use CPAP.   Thyroid disease    Past Surgical History:  Procedure Laterality Date   BREAST REDUCTION SURGERY Bilateral 04/17/2020   Procedure: MAMMARY REDUCTION  (BREAST);  Surgeon: Allena NapoleonPace, Collier S, MD;  Location: [facility];  Service: Plastics;  Laterality: Bilateral;  2.5 hours, please   COLONOSCOPY N/A 11/12/2015   Procedure: COLONOSCOPY;  Surgeon: West BaliSandi L Fields, MD;  [facility];  Service: Endoscopy;  Laterality: N/A;  8:30 AM   JOINT REPLACEMENT     KNEE ARTHROSCOPY WITH MEDIAL MENISECTOMY Left 05/04/2015   Procedure: KNEE ARTHROSCOPY WITH MEDIAL AND LATERAL MENISECTOMY;  Surgeon: Vickki HearingStanley E Harrison, MD;  Location: [facility];  Service: Orthopedics;  Laterality: Left;   NASAL SINUS SURGERY     ROTATOR CUFF REPAIR     SHOULDER ARTHROSCOPY Right 10/12/2018   right rotator cuff repair    SHOULDER SURGERY Right 10/12/2018   rotator cuff repair    TOTAL KNEE ARTHROPLASTY Left 01/29/2017   Procedure: TOTAL KNEE ARTHROPLASTY;  Surgeon: Vickki HearingHarrison,  Stanley E, MD;  Location: [facility];  Service: Orthopedics;  Laterality: Left;   Patient Active Problem List   Diagnosis Date Noted   Screening for colorectal cancer 12/07/2018   Encounter for gynecological examination with Papanicolaou smear of cervix 12/07/2018   Lichen planus 12/07/2018   Primary osteoarthritis of both hands 12/04/2017   Primary osteoarthritis of right knee 12/04/2017   Primary osteoarthritis of both feet 12/04/2017   History of hypothyroidism 12/04/2017   Dyslipidemia 12/04/2017   Status post total left knee replacement 01/29/17 10/14/2017   Special screening for malignant neoplasms, colon    Hemorrhoids 10/30/2015   Fecal occult blood test positive 10/30/2015   Postmenopausal 10/30/2015   Large breasts 10/30/2015   Bilateral shoulder pain 10/30/2015   Back pain 10/30/2015   Medial meniscus tear    Lateral meniscus tear, current    Erosive lichen planus of vulva 08/28/2014   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   Right hip flexor tightness 12/06/2013   Plantar fasciitis 12/06/2013    PCP: Assunta FoundJohn Golding   REFERRING PROVIDER: Fuller CanadaStanley Harrison   REFERRING DIAG: M54.50 (ICD-10-CM) - Lumbar pain  Rationale for Evaluation and Treatment: Rehabilitation  Rationale for Evaluation and Treatment: Rehabilitation  THERAPY DIAG:  Lumbar radiculopathy Muscle weakness   ONSET DATE: chronic   SUBJECTIVE:  SUBJECTIVE STATEMENT: Pt states that she only has a little pain in the morning and increases as the day goes on.  Currently 7/10.  States she stands and sits at work.   Evaluation:  PT states she's had back pain for years.  If she walks for a considerable distance, (greater than 10 minutes) she will have pain that radiates to her knee level.  If she rests the pain will go away.  Stand:  for 30  minutes; walking 10 minutes, sitting 30 minutes   PERTINENT HISTORY:  OA, Rt knee menisectomy; Lt TKR   PAIN:  Are you having pain? Yes: Pain location: 5/10;  worst: 8/10; best:  0  Pain description: achy Aggravating factors: walking  Relieving factors: rest   PRECAUTIONS: None  WEIGHT BEARING RESTRICTIONS: No  FALLS:  Has patient fallen in last 6 months? No  LIVING ENVIRONMENT: Lives with: lives with their family Lives in: House/apartment Stairs: Yes: External: 5 steps; on right going up pt has to go up one step at a time  Has following equipment at home: None  OCCUPATION: [employer].   PLOF: Independent with basic ADLs  PATIENT GOALS: less pain   NEXT MD VISIT: unknown  OBJECTIVE:   DIAGNOSTIC FINDINGS:  None  PATIENT SURVEYS:  FOTO 57  COGNITION: Overall cognitive status: Within functional limits for tasks assessed     SENSATION: Not tested  POSTURE: No Significant postural limitations   LUMBAR ROM:   AROM eval  Flexion Fingers to  floor reps slightly worse    Extension 25 reps  slightly worse  Right lateral flexion   Left lateral flexion   Right rotation   Left rotation    (Blank rows = not tested)   LOWER EXTREMITY MMT:    MMT Right eval Left eval  Hip flexion 5 4  Hip extension 3 3-  Hip abduction 3+ 3  Hip adduction    Hip internal rotation    Hip external rotation    Knee flexion 4 4  Knee extension 5 5  Ankle dorsiflexion 5 5  Ankle plantarflexion    Ankle inversion    Ankle eversion     (Blank rows = not tested)   FUNCTIONAL TESTS:  30 seconds chair stand test:  8 in 30 seconds ; 10 is poor for pt age  63 minute walk test: 39' with significant increased pain.  Single leg stance: RT:  5"  , LT:   8"   TODAY'S TREATMENT:                                                                                                                              DATE:  08/27/22 Supine:  Abdominal set 10X  Bridge 10X  KTC  5X10"  SLR 10X each with opp knee bent  Marching with core stab 10X each alternating Sidelying:  hip abduction 10X each   Evaluation and HEP  Abdominal set x 10  Bridge x 10  Knee to chest x 2   PATIENT EDUCATION:  Education details: HEP the importance of posture and good body mechanics.  Person educated: Patient Education method: Explanation Education comprehension: verbalized understanding  HOME EXERCISE PROGRAM: Access Code: [email or web address] URL: [email or web address] Date: 08/27/2022 Prepared by: Emeline Gins Exercises - - Supine March with Posterior Pelvic Tilt  - 2 x daily - 7 x weekly - 1 sets - 10 reps - Active Straight Leg Raise with Quad Set  - 2 x daily - 7 x weekly - 1 sets - 10 reps - Sidelying Hip Abduction  - 2 x daily - 7 x weekly - 1 sets - 10 reps   Access Code: [email or web address] URL: [email or web address] Date: 08/12/2022 Prepared by: Virgina Organ Exercises - Supine Transversus Abdominis Bracing - Hands on Stomach  - 3 x daily - 7 x weekly - 1 sets - 10 reps - 5" hold - Supine Single Knee to Chest Stretch  - 2 x daily - 7 x weekly - 1 sets - 3 reps - 30" hold - Beginner Bridge  - 2 x daily - 7 x weekly - 1 sets - 10 reps - 5" hold  ASSESSMENT:  CLINICAL IMPRESSION: Reviewed goals, HEP and POC moving forward.  Pt able to recall and complete HEP given at evaluation correctly with minimal cues.  Progressed with hip and LE strengthening in addition to core stabilization.  Updated HEP to include exercises added this session . Pt able to complete these without complaints of pain or issues.  Pt left without written instructions so will need these at next visit.  Pt will benefit from skilled PT to address deficits and maximize her functional ability.   OBJECTIVE IMPAIRMENTS: decreased activity tolerance, decreased balance, decreased ROM, decreased strength, impaired flexibility, improper body mechanics, postural dysfunction, obesity, and pain.    ACTIVITY LIMITATIONS: carrying, lifting, bending, standing, squatting, and locomotion level  PARTICIPATION LIMITATIONS: cleaning, laundry, shopping, and community activity  PERSONAL FACTORS: Fitness, Time since onset of injury/illness/exacerbation, and 1-2 comorbidities: OA, Lt TKR, RT menisectomy are also affecting patient's functional outcome.   REHAB POTENTIAL: Good  CLINICAL DECISION MAKING: Stable/uncomplicated  EVALUATION COMPLEXITY: Low   GOALS: Goals reviewed with patient? No  SHORT TERM GOALS: Target date 09/02/22:   Pt to be I in HEP in order to decrease pain to no greater than a 5/10 Baseline: Goal status: INITIAL   2.  Pt core and LE strength to be increased 1/2 grade to be able to come sit to stand from a low lying couch without difficulty Baseline:  Goal status: INITIAL  3.  Pt to demonstrate good body mechanics for bed mobility and lifting.  Baseline:  Goal status: INITIAL    LONG TERM GOALS: Target date: 09/23/2022  Pt to be I in advanced HEP to decrease pain to no greater than a 3/10 to allow increased functional activity Baseline:  Goal status: INITIAL  2.  PT strength to be increased one grade in core and LE to allow pt to walk for 30 minutes without increased pain.  Baseline:  Goal status: INITIAL  3.  PT to no longer have radicular sx to demonstrate decreased nerve irritation.  Baseline:  Goal status: INITIAL    PLAN:  PT FREQUENCY: 2x/week  PT DURATION: 6 weeks  PLANNED INTERVENTIONS: Therapeutic exercises, Therapeutic activity, Neuromuscular re-education, Balance training, Patient/Family education, Self Care, and Manual therapy.  PLAN FOR NEXT SESSION: Continue progressing lumbar stabilization.  Give printout forgotten at last  visit.  Add LE hamstring and piriformis stretches, standing hip strengthening as well.   Lurena Nida, PTA/CLT [facility] Ph: [phone]  Lurena Nida,  PTA 08/27/2022, 3:26 PM

## 2022-08-28 ENCOUNTER — Ambulatory Visit: Payer: 59 | Admitting: Orthopedic Surgery

## 2022-08-28 VITALS — BP 112/62 | HR 75

## 2022-08-28 DIAGNOSIS — M48061 Spinal stenosis, lumbar region without neurogenic claudication: Secondary | ICD-10-CM | POA: Diagnosis not present

## 2022-08-28 NOTE — Progress Notes (Signed)
Chief Complaint  Patient presents with   Follow-up    Recheck on back    Encounter Diagnosis  Name Primary?   Spinal stenosis of lumbar region without neurogenic claudication Yes   [name] is following up  56 year old female with symptoms of spinal stenosis with back pain which increases and advances to the left and right leg pain when walking relieved by sitting  The patient has had 2 out of 12 physical therapy visits  Currently takes Percocet diclofenac 75 Flexeril 10  She says she can tolerate the physical therapy so far she realizes she is very weak  She definitely has spinal stenosis but will continue physical therapy and come back and see me to see if we need to do an MRI

## 2022-09-02 ENCOUNTER — Ambulatory Visit (HOSPITAL_COMMUNITY): Payer: 59 | Admitting: Physical Therapy

## 2022-09-02 DIAGNOSIS — M6281 Muscle weakness (generalized): Secondary | ICD-10-CM

## 2022-09-02 DIAGNOSIS — M5459 Other low back pain: Secondary | ICD-10-CM

## 2022-09-02 NOTE — Therapy (Signed)
OUTPATIENT PHYSICAL THERAPY TREATMENT   Patient Name: [name] MRN: [id number] DOB:1965-11-10, 56 y.o., female Today's Date: 09/02/2022  END OF SESSION:  PT End of Session - 09/02/22 1438     Visit Number 3    Number of Visits 12    Date for PT Re-Evaluation 09/23/22    Authorization Type [employer]    Progress Note Due on Visit 10    PT Start Time 1436    PT Stop Time 1517    PT Time Calculation (min) 41 min    Activity Tolerance Patient tolerated treatment well    Behavior During Therapy [facility] for tasks assessed/performed             Past Medical History:  Diagnosis Date   Arthritis    Back pain 10/30/2015   Bilateral shoulder pain 10/30/2015   COPD (chronic obstructive pulmonary disease) (HCC)    Fecal occult blood test positive 10/30/2015   Hemorrhoids 10/30/2015   Hypertension    Hypothyroidism    Large breasts 10/30/2015   44 DD   Postmenopausal 10/30/2015   Sleep apnea    pt DX but did not want to use CPAP.   Thyroid disease    Past Surgical History:  Procedure Laterality Date   BREAST REDUCTION SURGERY Bilateral 04/17/2020   Procedure: MAMMARY REDUCTION  (BREAST);  Surgeon: Allena Napoleon, MD;  Location: [facility];  Service: Plastics;  Laterality: Bilateral;  2.5 hours, please   COLONOSCOPY N/A 11/12/2015   Procedure: COLONOSCOPY;  Surgeon: West Bali, MD;  [facility];  Service: Endoscopy;  Laterality: N/A;  8:30 AM   JOINT REPLACEMENT     KNEE ARTHROSCOPY WITH MEDIAL MENISECTOMY Left 05/04/2015   Procedure: KNEE ARTHROSCOPY WITH MEDIAL AND LATERAL MENISECTOMY;  Surgeon: Vickki Hearing, MD;  Location: [facility];  Service: Orthopedics;  Laterality: Left;   NASAL SINUS SURGERY     ROTATOR CUFF REPAIR     SHOULDER ARTHROSCOPY Right 10/12/2018   right rotator cuff repair    SHOULDER SURGERY Right 10/12/2018   rotator cuff repair    TOTAL KNEE ARTHROPLASTY Left 01/29/2017   Procedure: TOTAL KNEE ARTHROPLASTY;  Surgeon:  Vickki Hearing, MD;  Location: [facility];  Service: Orthopedics;  Laterality: Left;   Patient Active Problem List   Diagnosis Date Noted   Screening for colorectal cancer 12/07/2018   Encounter for gynecological examination with Papanicolaou smear of cervix 12/07/2018   Lichen planus 12/07/2018   Primary osteoarthritis of both hands 12/04/2017   Primary osteoarthritis of right knee 12/04/2017   Primary osteoarthritis of both feet 12/04/2017   History of hypothyroidism 12/04/2017   Dyslipidemia 12/04/2017   Status post total left knee replacement 01/29/17 10/14/2017   Special screening for malignant neoplasms, colon    Hemorrhoids 10/30/2015   Fecal occult blood test positive 10/30/2015   Postmenopausal 10/30/2015   Large breasts 10/30/2015   Bilateral shoulder pain 10/30/2015   Back pain 10/30/2015   Medial meniscus tear    Lateral meniscus tear, current    Erosive lichen planus of vulva 08/28/2014   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   Right hip flexor tightness 12/06/2013   Plantar fasciitis 12/06/2013    PCP: Assunta Found   REFERRING PROVIDER: [name]   REFERRING DIAG: M54.50 (ICD-10-CM) - Lumbar pain  Rationale for Evaluation and Treatment: Rehabilitation  Rationale for Evaluation and Treatment: Rehabilitation  THERAPY DIAG:  Lumbar radiculopathy Muscle weakness   ONSET DATE: chronic  SUBJECTIVE:                                                                                                                                                                                           SUBJECTIVE STATEMENT: Pt states she's about the same as last visit at 7/10.  States she stands and sits at work.   Evaluation:  PT states she's had back pain for years.  If she walks for a considerable distance, (greater than 10 minutes) she will have pain that radiates to her knee level.  If she rests the pain will go away.  Stand:  for 30 minutes; walking 10 minutes, sitting 30  minutes   PERTINENT HISTORY:  OA, Rt knee menisectomy; Lt TKR   PAIN:  Are you having pain? Yes: Pain location: 5/10;  worst: 8/10; best:  0  Pain description: achy Aggravating factors: walking  Relieving factors: rest   PRECAUTIONS: None  WEIGHT BEARING RESTRICTIONS: No  FALLS:  Has patient fallen in last 6 months? No  LIVING ENVIRONMENT: Lives with: lives with their family Lives in: House/apartment Stairs: Yes: External: 5 steps; on right going up pt has to go up one step at a time  Has following equipment at home: None  OCCUPATION: [employer].   PLOF: Independent with basic ADLs  PATIENT GOALS: less pain   NEXT MD VISIT: unknown  OBJECTIVE:   DIAGNOSTIC FINDINGS:  None  PATIENT SURVEYS:  FOTO 57  COGNITION: Overall cognitive status: Within functional limits for tasks assessed     SENSATION: Not tested  POSTURE: No Significant postural limitations   LUMBAR ROM:   AROM eval  Flexion Fingers to  floor reps slightly worse    Extension 25 reps  slightly worse  Right lateral flexion   Left lateral flexion   Right rotation   Left rotation    (Blank rows = not tested)   LOWER EXTREMITY MMT:    MMT Right eval Left eval  Hip flexion 5 4  Hip extension 3 3-  Hip abduction 3+ 3  Hip adduction    Hip internal rotation    Hip external rotation    Knee flexion 4 4  Knee extension 5 5  Ankle dorsiflexion 5 5  Ankle plantarflexion    Ankle inversion    Ankle eversion     (Blank rows = not tested)   FUNCTIONAL TESTS:  30 seconds chair stand test:  8 in 30 seconds ; 10 is poor for pt age  77 minute walk test: 62' with significant increased pain.  Single leg stance: RT:  5"  , LT:   8"  TODAY'S TREATMENT:                                                                                                                              DATE:  09/02/22 Sit to stand no UE 10X Supine: abdominal set 10X5"  Bridge 10X5"  KTC 5X10 with  towel  Hamstring stretch with towel 2X30" each  LTR 10X  SLR 10X with opp knee bent  Marching with core stab 10X each Sidelying hip abduction 10X each  08/27/22 Supine:  Abdominal set 10X  Bridge 10X  KTC 5X10"  SLR 10X each with opp knee bent  Marching with core stab 10X each alternating Sidelying:  hip abduction 10X each   Evaluation and HEP  Abdominal set x 10 Bridge x 10  Knee to chest x 2   PATIENT EDUCATION:  Education details: HEP the importance of posture and good body mechanics.  Person educated: Patient Education method: Explanation Education comprehension: verbalized understanding  HOME EXERCISE PROGRAM: Access Code: [email or web address] URL: [email or web address] Date: 08/27/2022 Prepared by: Roseanne Reno Exercises - - Supine March with Posterior Pelvic Tilt  - 2 x daily - 7 x weekly - 1 sets - 10 reps - Active Straight Leg Raise with Quad Set  - 2 x daily - 7 x weekly - 1 sets - 10 reps - Sidelying Hip Abduction  - 2 x daily - 7 x weekly - 1 sets - 10 reps   Access Code: [email or web address] URL: [email or web address] Date: 08/12/2022 Prepared by: Rayetta Humphrey Exercises - Supine Transversus Abdominis Bracing - Hands on Stomach  - 3 x daily - 7 x weekly - 1 sets - 10 reps - 5" hold - Supine Single Knee to Chest Stretch  - 2 x daily - 7 x weekly - 1 sets - 3 reps - 30" hold - Beginner Bridge  - 2 x daily - 7 x weekly - 1 sets - 10 reps - 5" hold  ASSESSMENT:  CLINICAL IMPRESSION: Continued with hip and LE strengthening in addition to core stabilization.  Noted weakness with SLR with minimal rise, especially on Rt.  Added sit to stands today.  Pt able to complete these without complaints of pain or issues.  Pt will continue to benefit from skilled PT to address deficits and maximize her functional ability.   OBJECTIVE IMPAIRMENTS: decreased activity tolerance, decreased balance, decreased ROM, decreased strength, impaired flexibility, improper body  mechanics, postural dysfunction, obesity, and pain.   ACTIVITY LIMITATIONS: carrying, lifting, bending, standing, squatting, and locomotion level  PARTICIPATION LIMITATIONS: cleaning, laundry, shopping, and community activity  PERSONAL FACTORS: Fitness, Time since onset of injury/illness/exacerbation, and 1-2 comorbidities: OA, Lt TKR, RT menisectomy are also affecting patient's functional outcome.   REHAB POTENTIAL: Good  CLINICAL DECISION MAKING: Stable/uncomplicated  EVALUATION COMPLEXITY: Low   GOALS: Goals reviewed with patient? No  SHORT TERM GOALS: Target date 09/02/22:   Pt to be I in HEP in order to decrease pain to  no greater than a 5/10 Baseline: Goal status: INITIAL   2.  Pt core and LE strength to be increased 1/2 grade to be able to come sit to stand from a low lying couch without difficulty Baseline:  Goal status: INITIAL  3.  Pt to demonstrate good body mechanics for bed mobility and lifting.  Baseline:  Goal status: INITIAL    LONG TERM GOALS: Target date: 09/23/2022  Pt to be I in advanced HEP to decrease pain to no greater than a 3/10 to allow increased functional activity Baseline:  Goal status: INITIAL  2.  PT strength to be increased one grade in core and LE to allow pt to walk for 30 minutes without increased pain.  Baseline:  Goal status: INITIAL  3.  PT to no longer have radicular sx to demonstrate decreased nerve irritation.  Baseline:  Goal status: INITIAL    PLAN:  PT FREQUENCY: 2x/week  PT DURATION: 6 weeks  PLANNED INTERVENTIONS: Therapeutic exercises, Therapeutic activity, Neuromuscular re-education, Balance training, Patient/Family education, Self Care, and Manual therapy.  PLAN FOR NEXT SESSION: Continue progressing lumbar stabilization.  Add piriformis stretches and standing hip strengthening as well.   Lurena Nida, PTA/CLT [facility] Ph: [phone]  Lurena Nida,  PTA 09/02/2022, 4:15 PM

## 2022-09-03 ENCOUNTER — Ambulatory Visit (HOSPITAL_COMMUNITY): Payer: 59 | Admitting: Physical Therapy

## 2022-09-03 ENCOUNTER — Encounter (HOSPITAL_COMMUNITY): Payer: Self-pay | Admitting: Physical Therapy

## 2022-09-03 DIAGNOSIS — M6281 Muscle weakness (generalized): Secondary | ICD-10-CM | POA: Diagnosis not present

## 2022-09-03 DIAGNOSIS — M5459 Other low back pain: Secondary | ICD-10-CM

## 2022-09-03 NOTE — Therapy (Signed)
OUTPATIENT PHYSICAL THERAPY TREATMENT   Patient Name: [name] MRN: [id number] DOB:08/15/1966, 56 y.o., female Today's Date: 09/03/2022  END OF SESSION:  PT End of Session - 09/03/22 1430     Visit Number 4    Number of Visits 12    Date for PT Re-Evaluation 09/23/22    Authorization Type [employer]    Progress Note Due on Visit 10    PT Start Time 1430    PT Stop Time 1508    PT Time Calculation (min) 38 min    Activity Tolerance Patient tolerated treatment well    Behavior [facility] for tasks assessed/performed             Past Medical History:  Diagnosis Date   Arthritis    Back pain 10/30/2015   Bilateral shoulder pain 10/30/2015   COPD (chronic obstructive pulmonary disease) (HCC)    Fecal occult blood test positive 10/30/2015   Hemorrhoids 10/30/2015   Hypertension    Hypothyroidism    Large breasts 10/30/2015   44 DD   Postmenopausal 10/30/2015   Sleep apnea    pt DX but did not want to use CPAP.   Thyroid disease    Past Surgical History:  Procedure Laterality Date   BREAST REDUCTION SURGERY Bilateral 04/17/2020   Procedure: MAMMARY REDUCTION  (BREAST);  Surgeon: Allena NapoleonPace, Collier S, MD;  Location: [facility];  Service: Plastics;  Laterality: Bilateral;  2.5 hours, please   COLONOSCOPY N/A 11/12/2015   Procedure: COLONOSCOPY;  Surgeon: West BaliSandi L Fields, MD;  [facility];  Service: Endoscopy;  Laterality: N/A;  8:30 AM   JOINT REPLACEMENT     KNEE ARTHROSCOPY WITH MEDIAL MENISECTOMY Left 05/04/2015   Procedure: KNEE ARTHROSCOPY WITH MEDIAL AND LATERAL MENISECTOMY;  Surgeon: Vickki HearingStanley E Harrison, MD;  Location: [facility];  Service: Orthopedics;  Laterality: Left;   NASAL SINUS SURGERY     ROTATOR CUFF REPAIR     SHOULDER ARTHROSCOPY Right 10/12/2018   right rotator cuff repair    SHOULDER SURGERY Right 10/12/2018   rotator cuff repair    TOTAL KNEE ARTHROPLASTY Left 01/29/2017   Procedure: TOTAL KNEE ARTHROPLASTY;  Surgeon:  Vickki HearingHarrison, Stanley E, MD;  Location: [facility];  Service: Orthopedics;  Laterality: Left;   Patient Active Problem List   Diagnosis Date Noted   Screening for colorectal cancer 12/07/2018   Encounter for gynecological examination with Papanicolaou smear of cervix 12/07/2018   Lichen planus 12/07/2018   Primary osteoarthritis of both hands 12/04/2017   Primary osteoarthritis of right knee 12/04/2017   Primary osteoarthritis of both feet 12/04/2017   History of hypothyroidism 12/04/2017   Dyslipidemia 12/04/2017   Status post total left knee replacement 01/29/17 10/14/2017   Special screening for malignant neoplasms, colon    Hemorrhoids 10/30/2015   Fecal occult blood test positive 10/30/2015   Postmenopausal 10/30/2015   Large breasts 10/30/2015   Bilateral shoulder pain 10/30/2015   Back pain 10/30/2015   Medial meniscus tear    Lateral meniscus tear, current    Erosive lichen planus of vulva 08/28/2014   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   Right hip flexor tightness 12/06/2013   Plantar fasciitis 12/06/2013    PCP: Assunta FoundJohn Golding   REFERRING PROVIDER: Fuller CanadaStanley Harrison   REFERRING DIAG: M54.50 (ICD-10-CM) - Lumbar pain  Rationale for Evaluation and Treatment: Rehabilitation  Rationale for Evaluation and Treatment: Rehabilitation  THERAPY DIAG:  Lumbar radiculopathy Muscle weakness   ONSET DATE: chronic  SUBJECTIVE:                                                                                                                                                                                           SUBJECTIVE STATEMENT: Patient states her back is feeling better today than yesterday, hurting when walking in. Hasn't noticed a whole lot of change so far.   Evaluation:  PT states she's had back pain for years.  If she walks for a considerable distance, (greater than 10 minutes) she will have pain that radiates to her knee level.  If she rests the pain will go away.  Stand:  for 30  minutes; walking 10 minutes, sitting 30 minutes   PERTINENT HISTORY:  OA, Rt knee menisectomy; Lt TKR   PAIN:  Are you having pain? Yes: Pain location: 4/10;  worst: 8/10; best:  0  Pain description: achy Aggravating factors: walking  Relieving factors: rest   PRECAUTIONS: None  WEIGHT BEARING RESTRICTIONS: No  FALLS:  Has patient fallen in last 6 months? No  LIVING ENVIRONMENT: Lives with: lives with their family Lives in: House/apartment Stairs: Yes: External: 5 steps; on right going up pt has to go up one step at a time  Has following equipment at home: None  OCCUPATION: [employer].   PLOF: Independent with basic ADLs  PATIENT GOALS: less pain   NEXT MD VISIT: unknown  OBJECTIVE:   DIAGNOSTIC FINDINGS:  None  PATIENT SURVEYS:  FOTO 57  COGNITION: Overall cognitive status: Within functional limits for tasks assessed     SENSATION: Not tested  POSTURE: No Significant postural limitations   LUMBAR ROM:   AROM eval  Flexion Fingers to  floor reps slightly worse    Extension 25 reps  slightly worse  Right lateral flexion   Left lateral flexion   Right rotation   Left rotation    (Blank rows = not tested)   LOWER EXTREMITY MMT:    MMT Right eval Left eval  Hip flexion 5 4  Hip extension 3 3-  Hip abduction 3+ 3  Hip adduction    Hip internal rotation    Hip external rotation    Knee flexion 4 4  Knee extension 5 5  Ankle dorsiflexion 5 5  Ankle plantarflexion    Ankle inversion    Ankle eversion     (Blank rows = not tested)   FUNCTIONAL TESTS:  30 seconds chair stand test:  8 in 30 seconds ; 10 is poor for pt age  58 minute walk test: 72' with significant increased pain.  Single leg stance: RT:  5"  ,  LT:   8"   TODAY'S TREATMENT:                                                                                                                              DATE:  09/03/22 LTR 10 x 5 second holds DKTC with heels on green ball  10 x 5 second holds Bridge 1 x 10  Supine piriformis stretch 3 x 20 second holds SLR with ab set 1 x 10 bilateral  Seated trunk flexion stretch with green ball 10 x 5 second holds Standing trunk flexion stretch 5 x 5 second  09/02/22 Sit to stand no UE 10X Supine: abdominal set 10X5"  Bridge 10X5"  KTC 5X10 with towel  Hamstring stretch with towel 2X30" each  LTR 10X  SLR 10X with opp knee bent  Marching with core stab 10X each Sidelying hip abduction 10X each  08/27/22 Supine:  Abdominal set 10X  Bridge 10X  KTC 5X10"  SLR 10X each with opp knee bent  Marching with core stab 10X each alternating Sidelying:  hip abduction 10X each   Evaluation and HEP  Abdominal set x 10 Bridge x 10  Knee to chest x 2   PATIENT EDUCATION:  Education details: HEP the importance of posture and good body mechanics.  Person educated: Patient Education method: Explanation Education comprehension: verbalized understanding  HOME EXERCISE PROGRAM: Access Code: [email or web address] URL: [email or web address] 09/03/22 - Standing Hip Abduction with Counter Support  - 2 x daily - 7 x weekly - 2-3 sets - 10 reps - Standing Lumbar Spine Flexion Stretch Counter  - 2-3 x daily - 7 x weekly - 10 reps - 5 second hold  Date: 08/27/2022 Prepared by: Emeline Gins Exercises - - Supine March with Posterior Pelvic Tilt  - 2 x daily - 7 x weekly - 1 sets - 10 reps - Active Straight Leg Raise with Quad Set  - 2 x daily - 7 x weekly - 1 sets - 10 reps - Sidelying Hip Abduction  - 2 x daily - 7 x weekly - 1 sets - 10 reps   Access Code: [email or web address] URL: [email or web address] Date: 08/12/2022 Prepared by: Virgina Organ Exercises - Supine Transversus Abdominis Bracing - Hands on Stomach  - 3 x daily - 7 x weekly - 1 sets - 10 reps - 5" hold - Supine Single Knee to Chest Stretch  - 2 x daily - 7 x weekly - 1 sets - 3 reps - 30" hold - Beginner Bridge  - 2 x daily - 7 x weekly - 1 sets - 10  reps - 5" hold  ASSESSMENT:  CLINICAL IMPRESSION: Continued with core strengthening and mobility exercises which are tolerated well. Began trunk flexion stretches, which felt good while completing, to see if decrease in symptoms with walking. Patient will continue to benefit from physical therapy in order to improve function and reduce impairment.   OBJECTIVE IMPAIRMENTS: decreased activity tolerance, decreased balance,  decreased ROM, decreased strength, impaired flexibility, improper body mechanics, postural dysfunction, obesity, and pain.   ACTIVITY LIMITATIONS: carrying, lifting, bending, standing, squatting, and locomotion level  PARTICIPATION LIMITATIONS: cleaning, laundry, shopping, and community activity  PERSONAL FACTORS: Fitness, Time since onset of injury/illness/exacerbation, and 1-2 comorbidities: OA, Lt TKR, RT menisectomy are also affecting patient's functional outcome.   REHAB POTENTIAL: Good  CLINICAL DECISION MAKING: Stable/uncomplicated  EVALUATION COMPLEXITY: Low   GOALS: Goals reviewed with patient? No  SHORT TERM GOALS: Target date 09/02/22:   Pt to be I in HEP in order to decrease pain to no greater than a 5/10 Baseline: Goal status: INITIAL   2.  Pt core and LE strength to be increased 1/2 grade to be able to come sit to stand from a low lying couch without difficulty Baseline:  Goal status: INITIAL  3.  Pt to demonstrate good body mechanics for bed mobility and lifting.  Baseline:  Goal status: INITIAL    LONG TERM GOALS: Target date: 09/23/2022  Pt to be I in advanced HEP to decrease pain to no greater than a 3/10 to allow increased functional activity Baseline:  Goal status: INITIAL  2.  PT strength to be increased one grade in core and LE to allow pt to walk for 30 minutes without increased pain.  Baseline:  Goal status: INITIAL  3.  PT to no longer have radicular sx to demonstrate decreased nerve irritation.  Baseline:  Goal status:  INITIAL    PLAN:  PT FREQUENCY: 2x/week  PT DURATION: 6 weeks  PLANNED INTERVENTIONS: Therapeutic exercises, Therapeutic activity, Neuromuscular re-education, Balance training, Patient/Family education, Self Care, and Manual therapy.  PLAN FOR NEXT SESSION: Continue progressing lumbar stabilization.      Vianne Bulls Scharlene Catalina, PT 09/03/2022, 2:30 PM

## 2022-09-04 ENCOUNTER — Encounter (HOSPITAL_COMMUNITY): Payer: 59

## 2022-09-04 ENCOUNTER — Telehealth (HOSPITAL_COMMUNITY): Payer: Self-pay

## 2022-09-04 NOTE — Telephone Encounter (Signed)
No show, called and spoke to pt who stated she was not aware of apt today. Reminded next apt date and time also discussed will be at old outpatient dept on [address], verbalized understanding   Becky Sax, LPTA/CLT; [place] [place] [phone]

## 2022-09-09 ENCOUNTER — Encounter (HOSPITAL_COMMUNITY): Payer: Self-pay

## 2022-09-09 ENCOUNTER — Ambulatory Visit (HOSPITAL_COMMUNITY): Payer: 59

## 2022-09-09 DIAGNOSIS — M6281 Muscle weakness (generalized): Secondary | ICD-10-CM

## 2022-09-09 DIAGNOSIS — M5459 Other low back pain: Secondary | ICD-10-CM

## 2022-09-09 NOTE — Therapy (Signed)
OUTPATIENT PHYSICAL THERAPY TREATMENT   Patient Name: [name] MRN: [id number] DOB:[birth year], 56 y.o., female Today's Date: 09/09/2022  END OF SESSION:  PT End of Session - 09/09/22 1556     Visit Number 5    Number of Visits 12    Date for PT Re-Evaluation 09/23/22    Authorization Type [employer]    Progress Note Due on Visit 10    PT Start Time 1525   pt arrived in hospital, delayed start   PT Stop Time 1559    PT Time Calculation (min) 34 min    Activity Tolerance Patient tolerated treatment well    Behavior During Therapy [facility] for tasks assessed/performed              Past Medical History:  Diagnosis Date   Arthritis    Back pain 10/30/2015   Bilateral shoulder pain 10/30/2015   COPD (chronic obstructive pulmonary disease) (HCC)    Fecal occult blood test positive 10/30/2015   Hemorrhoids 10/30/2015   Hypertension    Hypothyroidism    Large breasts 10/30/2015   44 DD   Postmenopausal 10/30/2015   Sleep apnea    pt DX but did not want to use CPAP.   Thyroid disease    Past Surgical History:  Procedure Laterality Date   BREAST REDUCTION SURGERY Bilateral 04/17/2020   Procedure: MAMMARY REDUCTION  (BREAST);  Surgeon: Allena Napoleon, MD;  Location: [facility];  Service: Plastics;  Laterality: Bilateral;  2.5 hours, please   COLONOSCOPY N/A 11/12/2015   Procedure: COLONOSCOPY;  Surgeon: West Bali, MD;  [facility];  Service: Endoscopy;  Laterality: N/A;  8:30 AM   JOINT REPLACEMENT     KNEE ARTHROSCOPY WITH MEDIAL MENISECTOMY Left 05/04/2015   Procedure: KNEE ARTHROSCOPY WITH MEDIAL AND LATERAL MENISECTOMY;  Surgeon: Vickki Hearing, MD;  Location: [facility];  Service: Orthopedics;  Laterality: Left;   NASAL SINUS SURGERY     ROTATOR CUFF REPAIR     SHOULDER ARTHROSCOPY Right 10/12/2018   right rotator cuff repair    SHOULDER SURGERY Right 10/12/2018   rotator cuff repair    TOTAL KNEE ARTHROPLASTY Left 01/29/2017   Procedure:  TOTAL KNEE ARTHROPLASTY;  Surgeon: Vickki Hearing, MD;  Location: [facility];  Service: Orthopedics;  Laterality: Left;   Patient Active Problem List   Diagnosis Date Noted   Screening for colorectal cancer 12/07/2018   Encounter for gynecological examination with Papanicolaou smear of cervix 12/07/2018   Lichen planus 12/07/2018   Primary osteoarthritis of both hands 12/04/2017   Primary osteoarthritis of right knee 12/04/2017   Primary osteoarthritis of both feet 12/04/2017   History of hypothyroidism 12/04/2017   Dyslipidemia 12/04/2017   Status post total left knee replacement 01/29/17 10/14/2017   Special screening for malignant neoplasms, colon    Hemorrhoids 10/30/2015   Fecal occult blood test positive 10/30/2015   Postmenopausal 10/30/2015   Large breasts 10/30/2015   Bilateral shoulder pain 10/30/2015   Back pain 10/30/2015   Medial meniscus tear    Lateral meniscus tear, current    Erosive lichen planus of vulva 08/28/2014   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   Right hip flexor tightness 12/06/2013   Plantar fasciitis 12/06/2013    PCP: Assunta Found   REFERRING PROVIDER: [name]   REFERRING DIAG: M54.50 (ICD-10-CM) - Lumbar pain  Rationale for Evaluation and Treatment: Rehabilitation  Rationale for Evaluation and Treatment: Rehabilitation  THERAPY DIAG:  Lumbar radiculopathy Muscle  weakness   ONSET DATE: chronic   SUBJECTIVE:                                                                                                                                                                                           SUBJECTIVE STATEMENT: Pt stated increased LBP today, increased pain with walking.  Reports increased stiffness due to colder weather.    Evaluation:  PT states she's had back pain for years.  If she walks for a considerable distance, (greater than 10 minutes) she will have pain that radiates to her knee level.  If she rests the pain will go away.   Stand:  for 30 minutes; walking 10 minutes, sitting 30 minutes   PERTINENT HISTORY:  OA, Rt knee menisectomy; Lt TKR   PAIN:  Are you having pain? Yes: Pain location: 7/10;  worst: 8/10; best:  0  Pain description: achy Aggravating factors: walking  Relieving factors: rest   PRECAUTIONS: None  WEIGHT BEARING RESTRICTIONS: No  FALLS:  Has patient fallen in last 6 months? No  LIVING ENVIRONMENT: Lives with: lives with their family Lives in: House/apartment Stairs: Yes: External: 5 steps; on right going up pt has to go up one step at a time  Has following equipment at home: None  OCCUPATION: [employer] store.   PLOF: Independent with basic ADLs  PATIENT GOALS: less pain   NEXT MD VISIT: unknown  OBJECTIVE:   DIAGNOSTIC FINDINGS:  None  PATIENT SURVEYS:  FOTO 57  COGNITION: Overall cognitive status: Within functional limits for tasks assessed     SENSATION: Not tested  POSTURE: No Significant postural limitations   LUMBAR ROM:   AROM eval  Flexion Fingers to  floor reps slightly worse    Extension 25 reps  slightly worse  Right lateral flexion   Left lateral flexion   Right rotation   Left rotation    (Blank rows = not tested)   LOWER EXTREMITY MMT:    MMT Right eval Left eval  Hip flexion 5 4  Hip extension 3 3-  Hip abduction 3+ 3  Hip adduction    Hip internal rotation    Hip external rotation    Knee flexion 4 4  Knee extension 5 5  Ankle dorsiflexion 5 5  Ankle plantarflexion    Ankle inversion    Ankle eversion     (Blank rows = not tested)   FUNCTIONAL TESTS:  30 seconds chair stand test:  8 in 30 seconds ; 10 is poor for pt age  66 minute walk test: 39353' with significant increased pain.  Single leg stance: RT:  5"  , LT:   8"   TODAY'S TREATMENT:                                                                                                                              DATE:  09/09/22: Reviewed log rolling mechanics LTR  with feet on green ball 5x 10" SKTC to opposite side 2x 30" towel assistance  Seated posture education  Wback 10x 5" Seated trunk flexion stretch with green ball 3 directions 3 reps x 10" each 3D hip excursion (weight shifting, rotation, STS and extension  09/03/22 LTR 10 x 5 second holds DKTC with heels on green ball 10 x 5 second holds Bridge 1 x 10  Supine piriformis stretch 3 x 20 second holds SLR with ab set 1 x 10 bilateral  Seated trunk flexion stretch with green ball 10 x 5 second holds Standing trunk flexion stretch 5 x 5 second  09/02/22 Sit to stand no UE 10X Supine: abdominal set 10X5"  Bridge 10X5"  KTC 5X10 with towel  Hamstring stretch with towel 2X30" each  LTR 10X  SLR 10X with opp knee bent  Marching with core stab 10X each Sidelying hip abduction 10X each  08/27/22 Supine:  Abdominal set 10X  Bridge 10X  KTC 5X10"  SLR 10X each with opp knee bent  Marching with core stab 10X each alternating Sidelying:  hip abduction 10X each   Evaluation and HEP  Abdominal set x 10 Bridge x 10  Knee to chest x 2   PATIENT EDUCATION:  Education details: HEP the importance of posture and good body mechanics.  Person educated: Patient Education method: Explanation Education comprehension: verbalized understanding  HOME EXERCISE PROGRAM: Access Code: [email or web address] URL: [email or web address] 09/03/22 - Standing Hip Abduction with Counter Support  - 2 x daily - 7 x weekly - 2-3 sets - 10 reps - Standing Lumbar Spine Flexion Stretch Counter  - 2-3 x daily - 7 x weekly - 10 reps - 5 second hold  Date: 08/27/2022 Prepared by: Emeline Gins Exercises - - Supine March with Posterior Pelvic Tilt  - 2 x daily - 7 x weekly - 1 sets - 10 reps - Active Straight Leg Raise with Quad Set  - 2 x daily - 7 x weekly - 1 sets - 10 reps - Sidelying Hip Abduction  - 2 x daily - 7 x weekly - 1 sets - 10 reps   Access Code: [email or web address] URL:  [email or web address] Date: 08/12/2022 Prepared by: Virgina Organ Exercises - Supine Transversus Abdominis Bracing - Hands on Stomach  - 3 x daily - 7 x weekly - 1 sets - 10 reps - 5" hold - Supine Single Knee to Chest Stretch  - 2 x daily - 7 x weekly - 1 sets - 3 reps - 30" hold - Beginner Bridge  - 2 x daily - 7 x weekly - 1 sets - 10 reps - 5" hold  ASSESSMENT:  CLINICAL IMPRESSION: Began session with lumbar mobility to address c/o stiffness and proximal strengthening.  Reviewed body mechanics, able to demonstrate good mechanics with log rolling getting into bed, required cueing for getting out.  Presents with forward head and rolled shoulders, educated benefits with improved posture and added postural strengthening exercises to POC.  Progressed to standing 3D hip excursion for gluteal strengthening and lumbar mobility.  EOS pt reports pain reduced to 3/10 (was 7/10 initially).   OBJECTIVE IMPAIRMENTS: decreased activity tolerance, decreased balance, decreased ROM, decreased strength, impaired flexibility, improper body mechanics, postural dysfunction, obesity, and pain.   ACTIVITY LIMITATIONS: carrying, lifting, bending, standing, squatting, and locomotion level  PARTICIPATION LIMITATIONS: cleaning, laundry, shopping, and community activity  PERSONAL FACTORS: Fitness, Time since onset of injury/illness/exacerbation, and 1-2 comorbidities: OA, Lt TKR, RT menisectomy are also affecting patient's functional outcome.   REHAB POTENTIAL: Good  CLINICAL DECISION MAKING: Stable/uncomplicated  EVALUATION COMPLEXITY: Low   GOALS: Goals reviewed with patient? No  SHORT TERM GOALS: Target date 09/02/22:   Pt to be I in HEP in order to decrease pain to no greater than a 5/10 Baseline: Goal status: IN PROGRESS    2.  Pt core and LE strength to be increased 1/2 grade to be able to come sit to stand from a low lying couch without difficulty Baseline:  Goal status: IN  PROGRESS   3.  Pt to demonstrate good body mechanics for bed mobility and lifting.  Baseline: 09/09/22:  Able to demonstrated good log rolling mechanics.   Goal status: IN PROGRESS     LONG TERM GOALS: Target date: 09/23/2022  Pt to be I in advanced HEP to decrease pain to no greater than a 3/10 to allow increased functional activity Baseline:  Goal status: IN PROGRESS   2.  PT strength to be increased one grade in core and LE to allow pt to walk for 30 minutes without increased pain.  Baseline:  Goal status: IN PROGRESS   3.  PT to no longer have radicular sx to demonstrate decreased nerve irritation.  Baseline:  Goal status: IN PROGRESS     PLAN:  PT FREQUENCY: 2x/week  PT DURATION: 6 weeks  PLANNED INTERVENTIONS: Therapeutic exercises, Therapeutic activity, Neuromuscular re-education, Balance training, Patient/Family education, Self Care, and Manual therapy.  PLAN FOR NEXT SESSION: Continue progressing lumbar stabilization.   Add postural strengthening exercises.   Becky Sax, LPTA/CLT; CBIS [phone]  Juel Burrow, PTA 09/09/2022, 4:51 PM

## 2022-09-11 ENCOUNTER — Ambulatory Visit (HOSPITAL_COMMUNITY): Payer: 59

## 2022-09-11 ENCOUNTER — Encounter (HOSPITAL_COMMUNITY): Payer: Self-pay

## 2022-09-11 DIAGNOSIS — M5459 Other low back pain: Secondary | ICD-10-CM | POA: Diagnosis not present

## 2022-09-11 DIAGNOSIS — M6281 Muscle weakness (generalized): Secondary | ICD-10-CM | POA: Diagnosis not present

## 2022-09-11 NOTE — Therapy (Addendum)
OUTPATIENT PHYSICAL THERAPY TREATMENT   Patient Name: [name] MRN: [id number] DOB:10/30/1965, 56 y.o., female Today's Date: 09/11/2022  END OF SESSION:   09/11/22 1522  PT Visits / Re-Eval  Visit Number 6  Number of Visits 12  Date for PT Re-Evaluation 09/23/22  Authorization  Authorization Type [employer]  Progress Note Due on Visit 10  PT Time Calculation  PT Start Time 1517  PT Stop Time 1600  PT Time Calculation (min) 43 min  PT - End of Session  Activity Tolerance Patient tolerated treatment well  Behavior During Therapy [facility] for tasks assessed/performed      Past Medical History:  Diagnosis Date   Arthritis    Back pain 10/30/2015   Bilateral shoulder pain 10/30/2015   COPD (chronic obstructive pulmonary disease) (HCC)    Fecal occult blood test positive 10/30/2015   Hemorrhoids 10/30/2015   Hypertension    Hypothyroidism    Large breasts 10/30/2015   44 DD   Postmenopausal 10/30/2015   Sleep apnea    pt DX but did not want to use CPAP.   Thyroid disease    Past Surgical History:  Procedure Laterality Date   BREAST REDUCTION SURGERY Bilateral 04/17/2020   Procedure: MAMMARY REDUCTION  (BREAST);  Surgeon: Allena Napoleon, MD;  Location: [facility];  Service: Plastics;  Laterality: Bilateral;  2.5 hours, please   COLONOSCOPY N/A 11/12/2015   Procedure: COLONOSCOPY;  Surgeon: West Bali, MD;  [facility];  Service: Endoscopy;  Laterality: N/A;  8:30 AM   JOINT REPLACEMENT     KNEE ARTHROSCOPY WITH MEDIAL MENISECTOMY Left 05/04/2015   Procedure: KNEE ARTHROSCOPY WITH MEDIAL AND LATERAL MENISECTOMY;  Surgeon: Vickki Hearing, MD;  Location: [facility];  Service: Orthopedics;  Laterality: Left;   NASAL SINUS SURGERY     ROTATOR CUFF REPAIR     SHOULDER ARTHROSCOPY Right 10/12/2018   right rotator cuff repair    SHOULDER SURGERY Right 10/12/2018   rotator cuff repair    TOTAL KNEE ARTHROPLASTY Left 01/29/2017   Procedure: TOTAL KNEE  ARTHROPLASTY;  Surgeon: Vickki Hearing, MD;  Location: [facility];  Service: Orthopedics;  Laterality: Left;   Patient Active Problem List   Diagnosis Date Noted   Screening for colorectal cancer 12/07/2018   Encounter for gynecological examination with Papanicolaou smear of cervix 12/07/2018   Lichen planus 12/07/2018   Primary osteoarthritis of both hands 12/04/2017   Primary osteoarthritis of right knee 12/04/2017   Primary osteoarthritis of both feet 12/04/2017   History of hypothyroidism 12/04/2017   Dyslipidemia 12/04/2017   Status post total left knee replacement 01/29/17 10/14/2017   Special screening for malignant neoplasms, colon    Hemorrhoids 10/30/2015   Fecal occult blood test positive 10/30/2015   Postmenopausal 10/30/2015   Large breasts 10/30/2015   Bilateral shoulder pain 10/30/2015   Back pain 10/30/2015   Medial meniscus tear    Lateral meniscus tear, current    Erosive lichen planus of vulva 08/28/2014   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  Reports decreased stiffness compared to last session.  Stated she has had busy day, works at [employer]6am.  Reports cramping in LE last night.    Evaluation:  PT states she's had back pain for years.  If she walks for a considerable distance, (greater than 10 minutes) she will have pain that  radiates to her knee level.  If she rests the pain will go away.  Stand:  for 30 minutes; walking 10 minutes, sitting 30 minutes   PERTINENT HISTORY:  OA, Rt knee menisectomy; Lt TKR   PAIN:  Are you having pain? Yes: 4/10;  worst: 8/10; best:  0  Pain location: LBP Pain description: achy Aggravating factors: walking  Relieving factors: rest   PRECAUTIONS: None  WEIGHT BEARING RESTRICTIONS: No  FALLS:  Has patient fallen in last 6 months? No  LIVING ENVIRONMENT: Lives with: lives with their family Lives in: House/apartment Stairs: Yes: External: 5 steps; on right going up pt has to go up one step at a time  Has following equipment at home: None  OCCUPATION: [employer] store.   PLOF: Independent with basic ADLs  PATIENT GOALS: less pain   NEXT MD VISIT: unknown, following discharge from PT  OBJECTIVE:   DIAGNOSTIC FINDINGS:  None  PATIENT SURVEYS:  FOTO 57  COGNITION: Overall cognitive status: Within functional limits for tasks assessed     SENSATION: Not tested  POSTURE: No Significant postural limitations   LUMBAR ROM:   AROM eval  Flexion Fingers to  floor reps slightly worse    Extension 25 reps  slightly worse  Right lateral flexion   Left lateral flexion   Right rotation   Left rotation    (Blank rows = not tested)   LOWER EXTREMITY MMT:    MMT Right eval Left eval  Hip flexion 5 4  Hip extension 3 3-  Hip abduction 3+ 3  Hip adduction    Hip internal rotation    Hip external rotation    Knee flexion 4 4  Knee extension 5 5  Ankle dorsiflexion 5 5  Ankle plantarflexion    Ankle inversion    Ankle eversion     (Blank rows = not tested)   FUNCTIONAL TESTS:  30 seconds chair stand test:  8 in 30 seconds ; 10 is poor for pt age  38 minute walk test: 12353' with significant increased pain.  Single leg stance: RT:  5"  , LT:   8"   TODAY'S TREATMENT:                                                                                                                               DATE:  09/11/22: RTB: shoulder extension 10x   Row 10x 3D hip excursion (weight shifting, rotation, STS and extension) 10 x each Standing marching 5x each with increased LBP so ended early  Sidelying clam  with GTB 10x 5"  Supine: bridge 10x 5" Seated trunk flexion stretch with green ball 3 directions 3 reps x 10" each Seated anterior/posterior pelvic rotation Supine: pubic clearing: bridge with alternating adduction ball squeeze and abduction into belt 2 set    09/09/22: Reviewed log rolling mechanics LTR with feet on green ball 5x 10" SKTC to opposite side 2x 30" towel assistance  Seated posture education  Wback 10x 5" Seated trunk flexion stretch with green ball 3 directions 3 reps x 10" each 3D hip excursion (weight shifting, rotation, STS and extension  09/03/22 LTR 10 x 5 second holds DKTC with heels on green ball 10 x 5 second holds Bridge 1 x 10  Supine piriformis stretch 3 x 20 second holds SLR with ab set 1 x 10 bilateral  Seated trunk flexion stretch with green ball 10 x 5 second holds Standing trunk flexion stretch 5 x 5 second  09/02/22 Sit to stand no UE 10X Supine: abdominal set 10X5"  Bridge 10X5"  KTC 5X10 with towel  Hamstring stretch with towel 2X30" each  LTR 10X  SLR 10X with opp knee bent  Marching with core stab 10X each Sidelying hip abduction 10X each  08/27/22 Supine:  Abdominal set 10X  Bridge 10X  KTC 5X10"  SLR 10X each with opp knee bent  Marching with core stab 10X each alternating Sidelying:  hip abduction 10X each   Evaluation and HEP  Abdominal set x 10 Bridge x 10  Knee to chest x 2   PATIENT EDUCATION:  Education details: HEP the importance of posture and good body mechanics.  Person educated: Patient Education method: Explanation Education comprehension: verbalized understanding  HOME EXERCISE PROGRAM: Access Code: [email or web address] URL:  [email or web address] 09/03/22 - Standing Hip Abduction with Counter Support  - 2 x daily - 7 x weekly - 2-3 sets - 10 reps - Standing Lumbar Spine Flexion Stretch Counter  - 2-3 x daily - 7 x weekly - 10 reps - 5 second hold  Date: 08/27/2022 Prepared by: Emeline Gins Exercises - - Supine March with Posterior Pelvic Tilt  - 2 x daily - 7 x weekly - 1 sets - 10 reps - Active Straight Leg Raise with Quad Set  - 2 x daily - 7 x weekly - 1 sets - 10 reps - Sidelying Hip Abduction  - 2 x daily - 7 x weekly - 1 sets - 10 reps   Access Code: [email or web address] URL: [email or web address] Date: 08/12/2022 Prepared by: Virgina Organ Exercises - Supine Transversus Abdominis Bracing - Hands on Stomach  - 3 x daily - 7 x weekly - 1 sets - 10 reps - 5" hold - Supine Single Knee to Chest Stretch  - 2 x daily - 7 x weekly - 1 sets - 3 reps - 30" hold - Beginner Bridge  - 2 x daily - 7 x weekly - 1 sets - 10 reps - 5" hold  ASSESSMENT:  CLINICAL IMPRESSION: Added postural strengthening exercises with theraband this session.  Attempted marching for core stability in standing position, reports of increased pain so resumed mat activities for core stability and proximal strengthening that was tolerated well.  Pt aggravated with continued pain daily, emotional this session.  Noted increased tenderness with palpation over Rt PSIS, added pelvic stability exercises.  Pain scale stayed same through session.  OBJECTIVE IMPAIRMENTS: decreased activity tolerance, decreased balance, decreased ROM, decreased strength, impaired flexibility, improper body mechanics, postural dysfunction, obesity, and pain.   ACTIVITY LIMITATIONS:  carrying, lifting, bending, standing, squatting, and locomotion level  PARTICIPATION LIMITATIONS: cleaning, laundry, shopping, and community activity  PERSONAL FACTORS: Fitness, Time since onset of injury/illness/exacerbation, and 1-2 comorbidities: OA, Lt TKR, RT  menisectomy are also affecting patient's functional outcome.   REHAB POTENTIAL: Good  CLINICAL DECISION MAKING: Stable/uncomplicated  EVALUATION COMPLEXITY: Low   GOALS: Goals reviewed with patient? No  SHORT TERM GOALS: Target date 09/02/22:   Pt to be I in HEP in order to decrease pain to no greater than a 5/10 Baseline: Goal status: IN PROGRESS    2.  Pt core and LE strength to be increased 1/2 grade to be able to come sit to stand from a low lying couch without difficulty Baseline:  Goal status: IN PROGRESS   3.  Pt to demonstrate good body mechanics for bed mobility and lifting.  Baseline: 09/09/22:  Able to demonstrated good log rolling mechanics.   Goal status: IN PROGRESS     LONG TERM GOALS: Target date: 09/23/2022  Pt to be I in advanced HEP to decrease pain to no greater than a 3/10 to allow increased functional activity Baseline:  Goal status: IN PROGRESS   2.  PT strength to be increased one grade in core and LE to allow pt to walk for 30 minutes without increased pain.  Baseline:  Goal status: IN PROGRESS   3.  PT to no longer have radicular sx to demonstrate decreased nerve irritation.  Baseline:  Goal status: IN PROGRESS     PLAN:  PT FREQUENCY: 2x/week  PT DURATION: 6 weeks  PLANNED INTERVENTIONS: Therapeutic exercises, Therapeutic activity, Neuromuscular re-education, Balance training, Patient/Family education, Self Care, and Manual therapy.  PLAN FOR NEXT SESSION: Continue progressing lumbar stabilization.      Becky Sax, LPTA/CLT; CBIS [phone]  Juel Burrow, PTA 09/11/2022, 3:18 PM

## 2022-09-17 ENCOUNTER — Ambulatory Visit (HOSPITAL_COMMUNITY): Payer: 59 | Admitting: Physical Therapy

## 2022-09-17 DIAGNOSIS — M6281 Muscle weakness (generalized): Secondary | ICD-10-CM

## 2022-09-17 DIAGNOSIS — M5459 Other low back pain: Secondary | ICD-10-CM | POA: Diagnosis not present

## 2022-09-17 NOTE — Therapy (Signed)
OUTPATIENT PHYSICAL THERAPY TREATMENT   Patient Name: [name] MRN: [id number] DOB:09/26/1965, 56 y.o., female Today's Date: 09/11/2022  END OF SESSION:   PT End of Session - 09/17/22 1600    Visit Number 7    Number of Visits 12    Date for PT Re-Evaluation 09/23/22    Authorization Type [employer]    Progress Note Due on Visit 10    PT Start Time 1520    PT Stop Time 1600    PT Time Calculation (min) 40 min    Activity Tolerance Patient tolerated treatment well    Behavior During Therapy [facility] for tasks assessed/performed              Past Medical History:  Diagnosis Date   Arthritis    Back pain 10/30/2015   Bilateral shoulder pain 10/30/2015   COPD (chronic obstructive pulmonary disease) (HCC)    Fecal occult blood test positive 10/30/2015   Hemorrhoids 10/30/2015   Hypertension    Hypothyroidism    Large breasts 10/30/2015   44 DD   Postmenopausal 10/30/2015   Sleep apnea    pt DX but did not want to use CPAP.   Thyroid disease    Past Surgical History:  Procedure Laterality Date   BREAST REDUCTION SURGERY Bilateral 04/17/2020   Procedure: MAMMARY REDUCTION  (BREAST);  Surgeon: Allena Napoleon, MD;  Location: [facility];  Service: Plastics;  Laterality: Bilateral;  2.5 hours, please   COLONOSCOPY N/A 11/12/2015   Procedure: COLONOSCOPY;  Surgeon: West Bali, MD;  [facility];  Service: Endoscopy;  Laterality: N/A;  8:30 AM   JOINT REPLACEMENT     KNEE ARTHROSCOPY WITH MEDIAL MENISECTOMY Left 05/04/2015   Procedure: KNEE ARTHROSCOPY WITH MEDIAL AND LATERAL MENISECTOMY;  Surgeon: Vickki Hearing, MD;  Location: [facility];  Service: Orthopedics;  Laterality: Left;   NASAL SINUS SURGERY     ROTATOR CUFF REPAIR     SHOULDER ARTHROSCOPY Right 10/12/2018   right rotator cuff repair    SHOULDER SURGERY Right 10/12/2018   rotator cuff repair    TOTAL KNEE ARTHROPLASTY Left 01/29/2017   Procedure: TOTAL KNEE ARTHROPLASTY;  Surgeon:  Vickki Hearing, MD;  Location: [facility];  Service: Orthopedics;  Laterality: Left;   Patient Active Problem List   Diagnosis Date Noted   Screening for colorectal cancer 12/07/2018   Encounter for gynecological examination with Papanicolaou smear of cervix 12/07/2018   Lichen planus 12/07/2018   Primary osteoarthritis of both hands 12/04/2017   Primary osteoarthritis of right knee 12/04/2017   Primary osteoarthritis of both feet 12/04/2017   History of hypothyroidism 12/04/2017   Dyslipidemia 12/04/2017   Status post total left knee replacement 01/29/17 10/14/2017   Special screening for malignant neoplasms, colon    Hemorrhoids 10/30/2015   Fecal occult blood test positive 10/30/2015   Postmenopausal 10/30/2015   Large breasts 10/30/2015   Bilateral shoulder pain 10/30/2015   Back pain 10/30/2015   Medial meniscus tear    Lateral meniscus tear, current    Erosive lichen planus of vulva 08/28/2014   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   Right hip flexor tightness 12/06/2013   Plantar fasciitis 12/06/2013    PCP: Assunta Found   REFERRING PROVIDER: [name]   REFERRING DIAG: M54.50 (ICD-10-CM) - Lumbar pain  Rationale for Evaluation and Treatment: Rehabilitation  Rationale for Evaluation and Treatment: Rehabilitation  THERAPY DIAG:  Lumbar radiculopathy Muscle weakness   ONSET DATE: chronic  SUBJECTIVE:                                                                                                                                                                                           SUBJECTIVE STATEMENT: Pt states that she hurts mainly on the RT side of her back PERTINENT HISTORY:  OA, Rt knee menisectomy; Lt TKR   PAIN:  Are you having pain? Yes: 4/10;  worst: 8/10; best:  0  Pain location: LBP Pain description: achy Aggravating factors: walking  Relieving factors: rest   PRECAUTIONS: None  WEIGHT BEARING RESTRICTIONS: No  FALLS:  Has patient fallen  in last 6 months? No  LIVING ENVIRONMENT: Lives with: lives with their family Lives in: House/apartment Stairs: Yes: External: 5 steps; on right going up pt has to go up one step at a time  Has following equipment at home: None  OCCUPATION: [employer].   PLOF: Independent with basic ADLs  PATIENT GOALS: less pain   NEXT MD VISIT: unknown, following discharge from PT  OBJECTIVE:   DIAGNOSTIC FINDINGS:  None  PATIENT SURVEYS:  FOTO 57  COGNITION: Overall cognitive status: Within functional limits for tasks assessed     SENSATION: Not tested  POSTURE: No Significant postural limitations   LUMBAR ROM:   AROM eval  Flexion Fingers to  floor reps slightly worse    Extension 25 reps  slightly worse  Right lateral flexion   Left lateral flexion   Right rotation   Left rotation    (Blank rows = not tested)   LOWER EXTREMITY MMT:    MMT Right eval Left eval  Hip flexion 5 4  Hip extension 3 3-  Hip abduction 3+ 3  Hip adduction    Hip internal rotation    Hip external rotation    Knee flexion 4 4  Knee extension 5 5  Ankle dorsiflexion 5 5  Ankle plantarflexion    Ankle inversion    Ankle eversion     (Blank rows = not tested)   FUNCTIONAL TESTS:  30 seconds chair stand test:  8 in 30 seconds ; 10 is poor for pt age  10 minute walk test: 31353' with significant increased pain.  Single leg stance: RT:  5"  , LT:   8"   TODAY'S TREATMENT:  DATE:  09/17/22 Standing: Chilton Si Thera-band Scapular retraction x15 Rows x15 Shoulder extension x 15 Anti-rotation x 15 B  PNF 2 B x 10  Standing with B UE flexion keeping core strong x10 Wall arch x 10 Against wall opposite arm/leg raise attempted, unable therefore just marched x 10 Functional squat x 10 Sit to stand x 10  3 D hip excursion x 3 Side step x 2 RT Supine: Knee to  chest 30" x 3 B   Hamstring stretch x 3 LTR x 10 Bridge x 15  09/11/22: RTB: shoulder extension 10x   Row 10x 3D hip excursion (weight shifting, rotation, STS and extension) 10 x each Standing marching 5x each with increased LBP so ended early  Sidelying clam with GTB 10x 5"  Supine: bridge 10x 5" Seated trunk flexion stretch with green ball 3 directions 3 reps x 10" each Seated anterior/posterior pelvic rotation Supine: pubic clearing: bridge with alternating adduction ball squeeze and abduction into belt 2 set    09/09/22: Reviewed log rolling mechanics LTR with feet on green ball 5x 10" SKTC to opposite side 2x 30" towel assistance  Seated posture education  Wback 10x 5" Seated trunk flexion stretch with green ball 3 directions 3 reps x 10" each 3D hip excursion (weight shifting, rotation, STS and extension  09/03/22 LTR 10 x 5 second holds DKTC with heels on green ball 10 x 5 second holds Bridge 1 x 10  Supine piriformis stretch 3 x 20 second holds SLR with ab set 1 x 10 bilateral  Seated trunk flexion stretch with green ball 10 x 5 second holds Standing trunk flexion stretch 5 x 5 second    Evaluation and HEP  Abdominal set x 10 Bridge x 10  Knee to chest x 2   PATIENT EDUCATION:  Education details: HEP the importance of posture and good body mechanics.  Person educated: Patient Education method: Explanation Education comprehension: verbalized understanding  HOME EXERCISE PROGRAM: Access Code: [email or web address] URL: [email or web address] 09/03/22 - Standing Hip Abduction with Counter Support  - 2 x daily - 7 x weekly - 2-3 sets - 10 reps - Standing Lumbar Spine Flexion Stretch Counter  - 2-3 x daily - 7 x weekly - 10 reps - 5 second hold  Date: 08/27/2022 Prepared by: Emeline Gins Exercises - - Supine March with Posterior Pelvic Tilt  - 2 x daily - 7 x weekly - 1 sets - 10 reps - Active Straight Leg Raise with Quad Set  - 2 x daily - 7 x weekly -  1 sets - 10 reps - Sidelying Hip Abduction  - 2 x daily - 7 x weekly - 1 sets - 10 reps   Access Code: [email or web address] URL: [email or web address] Date: 08/12/2022 Prepared by: Virgina Organ Exercises - Supine Transversus Abdominis Bracing - Hands on Stomach  - 3 x daily - 7 x weekly - 1 sets - 10 reps - 5" hold - Supine Single Knee to Chest Stretch  - 2 x daily - 7 x weekly - 1 sets - 3 reps - 30" hold - Beginner Bridge  - 2 x daily - 7 x weekly - 1 sets - 10 reps - 5" hold  ASSESSMENT:  CLINICAL IMPRESSION: Continued to add to postural strengthening exercises with theraband this session.   Focused on standing core strengthening activities this session.  OBJECTIVE IMPAIRMENTS: decreased activity tolerance, decreased balance, decreased ROM, decreased strength, impaired flexibility, improper body mechanics, postural dysfunction, obesity, and pain.   ACTIVITY  LIMITATIONS: carrying, lifting, bending, standing, squatting, and locomotion level  PARTICIPATION LIMITATIONS: cleaning, laundry, shopping, and community activity  PERSONAL FACTORS: Fitness, Time since onset of injury/illness/exacerbation, and 1-2 comorbidities: OA, Lt TKR, RT menisectomy are also affecting patient's functional outcome.   REHAB POTENTIAL: Good  CLINICAL DECISION MAKING: Stable/uncomplicated  EVALUATION COMPLEXITY: Low   GOALS: Goals reviewed with patient? No  SHORT TERM GOALS: Target date 09/02/22:   Pt to be I in HEP in order to decrease pain to no greater than a 5/10 Baseline: Goal status: IN PROGRESS    2.  Pt core and LE strength to be increased 1/2 grade to be able to come sit to stand from a low lying couch without difficulty Baseline:  Goal status: IN PROGRESS   3.  Pt to demonstrate good body mechanics for bed mobility and lifting.  Baseline: 09/09/22:  Able to demonstrated good log rolling mechanics.   Goal status: IN PROGRESS     LONG TERM GOALS: Target date: 09/23/2022  Pt  to be I in advanced HEP to decrease pain to no greater than a 3/10 to allow increased functional activity Baseline:  Goal status: IN PROGRESS   2.  PT strength to be increased one grade in core and LE to allow pt to walk for 30 minutes without increased pain.  Baseline:  Goal status: IN PROGRESS   3.  PT to no longer have radicular sx to demonstrate decreased nerve irritation.  Baseline:  Goal status: IN PROGRESS     PLAN:  PT FREQUENCY: 2x/week  PT DURATION: 6 weeks  PLANNED INTERVENTIONS: Therapeutic exercises, Therapeutic activity, Neuromuscular re-education, Balance training, Patient/Family education, Self Care, and Manual therapy.  PLAN FOR NEXT SESSION: Continue progressing lumbar stabilization.     Donnamae Jude PT/CLT [phone]

## 2022-09-19 ENCOUNTER — Encounter (HOSPITAL_COMMUNITY): Payer: 59 | Admitting: Physical Therapy

## 2022-09-23 ENCOUNTER — Ambulatory Visit (HOSPITAL_COMMUNITY): Payer: Medicaid Other | Attending: Orthopedic Surgery | Admitting: Physical Therapy

## 2022-09-23 DIAGNOSIS — M6281 Muscle weakness (generalized): Secondary | ICD-10-CM | POA: Diagnosis not present

## 2022-09-23 DIAGNOSIS — M5459 Other low back pain: Secondary | ICD-10-CM | POA: Insufficient documentation

## 2022-09-23 NOTE — Therapy (Signed)
OUTPATIENT PHYSICAL THERAPY TREATMENT Progress Note Reporting Period 08/12/22 to 09/23/22  See note below for Objective Data and Assessment of Progress/Goals.      Patient Name: [name] MRN: [id number] DOB:1965/12/15, 57 y.o., female Today's Date: 09/23/2022  END OF SESSION:  PT End of Session - 09/23/22 1513     Visit Number 8    Number of Visits 12    Date for PT Re-Evaluation 09/23/22    Authorization Type [employer]    Progress Note Due on Visit 10    PT Start Time 1515    PT Stop Time 1600    PT Time Calculation (min) 45 min    Activity Tolerance Patient tolerated treatment well    Behavior During Therapy [facility] for tasks assessed/performed                Past Medical History:  Diagnosis Date   Arthritis    Back pain 10/30/2015   Bilateral shoulder pain 10/30/2015   COPD (chronic obstructive pulmonary disease) ([place])    Fecal occult blood test positive 10/30/2015   Hemorrhoids 10/30/2015   Hypertension    Hypothyroidism    Large breasts 10/30/2015   44 DD   Postmenopausal 10/30/2015   Sleep apnea    pt DX but did not want to use CPAP.   Thyroid disease    Past Surgical History:  Procedure Laterality Date   BREAST REDUCTION SURGERY Bilateral 04/17/2020   Procedure: MAMMARY REDUCTION  (BREAST);  Surgeon: Cindra Presume, MD;  Location: [facility];  Service: Plastics;  Laterality: Bilateral;  2.5 hours, please   COLONOSCOPY N/A 11/12/2015   Procedure: COLONOSCOPY;  Surgeon: Danie Binder, MD;  [facility];  Service: Endoscopy;  Laterality: N/A;  8:30 AM   JOINT REPLACEMENT     KNEE ARTHROSCOPY WITH MEDIAL MENISECTOMY Left 05/04/2015   Procedure: KNEE ARTHROSCOPY WITH MEDIAL AND LATERAL MENISECTOMY;  Surgeon: Carole Civil, MD;  Location: [facility];  Service: Orthopedics;  Laterality: Left;   NASAL SINUS SURGERY     ROTATOR CUFF REPAIR     SHOULDER ARTHROSCOPY Right 10/12/2018   right rotator cuff repair    SHOULDER SURGERY Right  10/12/2018   rotator cuff repair    TOTAL KNEE ARTHROPLASTY Left 01/29/2017   Procedure: TOTAL KNEE ARTHROPLASTY;  Surgeon: Carole Civil, MD;  Location: [facility];  Service: Orthopedics;  Laterality: Left;   Patient Active Problem List   Diagnosis Date Noted   Screening for colorectal cancer 12/07/2018   Encounter for gynecological examination with Papanicolaou smear of cervix 28/31/5176   Lichen planus 16/03/3709   Primary osteoarthritis of both hands 12/04/2017   Primary osteoarthritis of right knee 12/04/2017   Primary osteoarthritis of both feet 12/04/2017   History of hypothyroidism 12/04/2017   Dyslipidemia 12/04/2017   Status post total left knee replacement 01/29/17 10/14/2017   Special screening for malignant neoplasms, colon    Hemorrhoids 10/30/2015   Fecal occult blood test positive 10/30/2015   Postmenopausal 10/30/2015   Large breasts 10/30/2015   Bilateral shoulder pain 10/30/2015   Back pain 10/30/2015   Medial meniscus tear    Lateral meniscus tear, current    Erosive lichen planus of vulva 08/28/2014   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   Right hip flexor tightness 12/06/2013   Plantar fasciitis 12/06/2013    PCP: Sharilyn Sites   REFERRING PROVIDER: Arther Abbott   REFERRING DIAG: M54.50 (ICD-10-CM) - Lumbar pain  Rationale for Evaluation and  Treatment: Rehabilitation  Rationale for Evaluation and Treatment: Rehabilitation  THERAPY DIAG:  Lumbar radiculopathy Muscle weakness   ONSET DATE: chronic   SUBJECTIVE:                                                                                                                                                                                           SUBJECTIVE STATEMENT: Pt states that she hurts mainly on the RT side of her back  PERTINENT HISTORY:  OA, Rt knee menisectomy; Lt TKR   PAIN:  Are you having pain? Yes: 4/10;  worst: 8/10; best:  0  Pain location: LBP Pain description: achy Aggravating  factors: walking  Relieving factors: rest   PRECAUTIONS: None  WEIGHT BEARING RESTRICTIONS: No  FALLS:  Has patient fallen in last 6 months? No  LIVING ENVIRONMENT: Lives with: lives with their family Lives in: House/apartment Stairs: Yes: External: 5 steps; on right going up pt has to go up one step at a time  Has following equipment at home: None  OCCUPATION: [employer].   PLOF: Independent with basic ADLs  PATIENT GOALS: less pain   NEXT MD VISIT: unknown, following discharge from PT  OBJECTIVE:   DIAGNOSTIC FINDINGS:  None  PATIENT SURVEYS:  FOTO   09/23/22:56% functional status (evaluation 57%)  COGNITION: Overall cognitive status: Within functional limits for tasks assessed     SENSATION: Not tested  POSTURE: No Significant postural limitations   LUMBAR ROM:   AROM eval 09/23/22  Flexion Fingers to  floor reps slightly worse   same  Extension 25 reps  slightly worse same  Right lateral flexion    Left lateral flexion    Right rotation    Left rotation     (Blank rows = not tested)   LOWER EXTREMITY MMT:    MMT Right eval Left eval Right 09/23/22 Left 09/23/22  Hip flexion _0 Hip extension 3 3- 3+ 3+  Hip abduction 3+ _1 Hip adduction      Hip internal rotation      Hip external rotation      Knee flexion _2 Knee extension _3 Ankle dorsiflexion _4 Ankle plantarflexion      Ankle inversion      Ankle eversion       (Blank rows = not tested)   FUNCTIONAL TESTS:  09/23/22 30 seconds chair stand test:  standard height no UE 13  (evaluation: 8 in 30 seconds ; 10 is poor for pt age)  2 minute walk test: 59' no  AD with pain last 50 feet (at evaluation 353' with significant increased pain)  Single leg stance:  Rt:12"  Lt: 16"  (at evaluation: RT: 5" , LT: 8")  Evaluation:  30 seconds chair stand test:  8 in 30 seconds ; 10 is poor for pt age  51 minute walk test: 66' with significant increased pain.  Single  leg stance: RT:  5"  , LT:   8"   TODAY'S TREATMENT:                                                                                                                              DATE:   09/23/22 FUNCTIONAL TESTS:  30 seconds chair stand test:  standard height no UE 13  (evaluation: 8 in 30 seconds ; 10 is poor for pt age)  2 minute walk test: 59' no AD with pain last 50 feet (at evaluation 85' with significant increased pain)  Single leg stance:  Rt:12"  Lt: 16"  (at evaluation: RT: 5" , LT: 8") Goal review MMT (see above) ROM  09/17/22 Standing: Green Thera-band Scapular retraction x15 Rows x15 Shoulder extension x 15 Anti-rotation x 15 B  PNF 2 B x 10  Standing with B UE flexion keeping core strong x10 Wall arch x 10 Against wall opposite arm/leg raise attempted, unable therefore just marched x 10 Functional squat x 10 Sit to stand x 10  3 D hip excursion x 3 Side step x 2 RT Supine: Knee to chest 30" x 3 B   Hamstring stretch x 3 LTR x 10 Bridge x 15   09/11/22: RTB: shoulder extension 10x   Row 10x 3D hip excursion (weight shifting, rotation, STS and extension) 10 x each Standing marching 5x each with increased LBP so ended early  Sidelying clam with GTB 10x 5"  Supine: bridge 10x 5" Seated trunk flexion stretch with green ball 3 directions 3 reps x 10" each Seated anterior/posterior pelvic rotation Supine: pubic clearing: bridge with alternating adduction ball squeeze and abduction into belt 2 set    09/09/22: Reviewed log rolling mechanics LTR with feet on green ball 5x 10" SKTC to opposite side 2x 30" towel assistance  Seated posture education  Wback 10x 5" Seated trunk flexion stretch with green ball 3 directions 3 reps x 10" each 3D hip excursion (weight shifting, rotation, STS and extension  09/03/22 LTR 10 x 5 second holds DKTC with heels on green ball 10 x 5 second holds Bridge 1 x 10  Supine piriformis stretch 3 x 20 second holds SLR with  ab set 1 x 10 bilateral  Seated trunk flexion stretch with green ball 10 x 5 second holds Standing trunk flexion stretch 5 x 5 second    Evaluation and HEP  Abdominal set x 10 Bridge x 10  Knee to chest x 2   PATIENT EDUCATION:  Education details: HEP the importance of posture and good body mechanics.  Person educated: Patient Education method: Explanation Education comprehension: verbalized understanding  HOME EXERCISE PROGRAM: Access Code: [email or web address] URL: [email or web address] 09/03/22 - Standing Hip Abduction with Counter Support  - 2 x daily - 7 x weekly - 2-3 sets - 10 reps - Standing Lumbar Spine Flexion Stretch Counter  - 2-3 x daily - 7 x weekly - 10 reps - 5 second hold  Date: 08/27/2022 Prepared by: Roseanne Reno Exercises - - Supine March with Posterior Pelvic Tilt  - 2 x daily - 7 x weekly - 1 sets - 10 reps - Active Straight Leg Raise with Quad Set  - 2 x daily - 7 x weekly - 1 sets - 10 reps - Sidelying Hip Abduction  - 2 x daily - 7 x weekly - 1 sets - 10 reps   Access Code: [email or web address] URL: [email or web address] Date: 08/12/2022 Prepared by: Rayetta Humphrey Exercises - Supine Transversus Abdominis Bracing - Hands on Stomach  - 3 x daily - 7 x weekly - 1 sets - 10 reps - 5" hold - Supine Single Knee to Chest Stretch  - 2 x daily - 7 x weekly - 1 sets - 3 reps - 30" hold - Beginner Bridge  - 2 x daily - 7 x weekly - 1 sets - 10 reps - 5" hold  ASSESSMENT:  CLINICAL IMPRESSION: Reassessment completed this session with overall progress made in strength, function and pain.  Pt has met all STG's but unable to meet LTG's as she has continued pain and dysfunction caused by combination of Rt knee and LBP.  Pt would like to be discharged and return to MD at this point to discuss options going forward.  Pt is independent with advanced HEP and has no further questions or concerns at this point.    OBJECTIVE IMPAIRMENTS: decreased activity  tolerance, decreased balance, decreased ROM, decreased strength, impaired flexibility, improper body mechanics, postural dysfunction, obesity, and pain.   ACTIVITY LIMITATIONS: carrying, lifting, bending, standing, squatting, and locomotion level  PARTICIPATION LIMITATIONS: cleaning, laundry, shopping, and community activity  PERSONAL FACTORS: Fitness, Time since onset of injury/illness/exacerbation, and 1-2 comorbidities: OA, Lt TKR, RT menisectomy are also affecting patient's functional outcome.   REHAB POTENTIAL: Good  CLINICAL DECISION MAKING: Stable/uncomplicated  EVALUATION COMPLEXITY: Low   GOALS: Goals reviewed with patient? No  SHORT TERM GOALS: Target date 09/02/22:   Pt to be I in HEP in order to decrease pain to no greater than a 5/10 Baseline: Goal status: MET  2.  Pt core and LE strength to be increased 1/2 grade to be able to come sit to stand from a low lying couch without difficulty Baseline:  Goal status: MET  3.  Pt to demonstrate good body mechanics for bed mobility and lifting.  Baseline: 09/09/22:  Able to demonstrated good log rolling mechanics.   Goal status: MET       LONG TERM GOALS: Target date: 09/23/2022  Pt to be I in advanced HEP to decrease pain to no greater than a 3/10 to allow increased functional activity Baseline:  Goal status: PARTLY MET (independent in advance HEP, pain less than 3/10 60% of time)  2.  PT strength to be increased one grade in core and LE to allow pt to walk for 30 minutes without increased pain.  Baseline:  Goal status: PARTLY MET for all mm groups except glutes, unable to walk greater than 30 minutes due to knee   3.  PT to no longer have radicular sx to  demonstrate decreased nerve irritation.  Baseline:  Goal status: NOT MET    PLAN:  PT FREQUENCY: 2x/week  PT DURATION: 6 weeks  PLANNED INTERVENTIONS: Therapeutic exercises, Therapeutic activity, Neuromuscular re-education, Balance training, Patient/Family  education, Self Care, and Manual therapy.  PLAN FOR NEXT SESSION: Discharge.    Teena Irani, PTA/CLT [place] Ph: [phone]   Teena Irani, PTA 09/23/2022, 3:55 PM

## 2022-09-25 ENCOUNTER — Encounter (HOSPITAL_COMMUNITY): Payer: 59 | Admitting: Physical Therapy

## 2022-09-28 ENCOUNTER — Ambulatory Visit (INDEPENDENT_AMBULATORY_CARE_PROVIDER_SITE_OTHER): Payer: Medicaid Other

## 2022-09-28 ENCOUNTER — Encounter: Payer: Self-pay | Admitting: Emergency Medicine

## 2022-09-28 ENCOUNTER — Ambulatory Visit
Admission: EM | Admit: 2022-09-28 | Discharge: 2022-09-28 | Disposition: A | Discharge status: Home / Self Care | Payer: Medicaid Other | Attending: Nurse Practitioner | Admitting: Nurse Practitioner

## 2022-09-28 DIAGNOSIS — Z8709 Personal history of other diseases of the respiratory system: Secondary | ICD-10-CM | POA: Diagnosis not present

## 2022-09-28 DIAGNOSIS — R059 Cough, unspecified: Secondary | ICD-10-CM | POA: Diagnosis not present

## 2022-09-28 DIAGNOSIS — J189 Pneumonia, unspecified organism: Secondary | ICD-10-CM | POA: Diagnosis not present

## 2022-09-28 MED ORDER — AMOXICILLIN-POT CLAVULANATE 875-125 MG PO TABS: 1.0000 | ORAL_TABLET | Freq: Two times a day (BID) | ORAL | 0 refills | Status: DC

## 2022-09-28 MED ORDER — METHYLPREDNISOLONE SODIUM SUCC 125 MG IJ SOLR
125.0000 mg | Freq: Once | INTRAMUSCULAR | Status: AC
  Administered 2022-09-28: 125 mg via INTRAMUSCULAR

## 2022-09-28 MED ORDER — PREDNISONE 20 MG PO TABS: 40.0000 mg | ORAL_TABLET | Freq: Every day | ORAL | 0 refills | Status: AC

## 2022-09-28 MED ORDER — AZITHROMYCIN 250 MG PO TABS: 250.0000 mg | ORAL_TABLET | Freq: Every day | ORAL | 0 refills | Status: DC

## 2022-09-28 MED ORDER — FLUCONAZOLE 150 MG PO TABS: 150.0000 mg | ORAL_TABLET | Freq: Once | ORAL | 0 refills | Status: AC

## 2022-09-28 MED ORDER — PROMETHAZINE-DM 6.25-15 MG/5ML PO SYRP: 5.0000 mL | ORAL_SOLUTION | Freq: Four times a day (QID) | ORAL | 0 refills | Status: DC | PRN

## 2022-09-28 MED ORDER — ALBUTEROL SULFATE HFA 108 (90 BASE) MCG/ACT IN AERS: 2.0000 | INHALATION_SPRAY | Freq: Four times a day (QID) | RESPIRATORY_TRACT | 0 refills | Status: DC | PRN

## 2022-09-28 MED ORDER — ALBUTEROL SULFATE (2.5 MG/3ML) 0.083% IN NEBU
2.5000 mg | INHALATION_SOLUTION | Freq: Once | RESPIRATORY_TRACT | Status: AC
  Administered 2022-09-28: 2.5 mg via RESPIRATORY_TRACT

## 2022-09-28 NOTE — Discharge Instructions (Signed)
The chest x-ray shows that you have a right middle lobe pneumonia. Take medication as prescribed. Increase fluids and allow for plenty of rest. May take over-the-counter Tylenol as needed for pain, fever, or general discomfort. Recommend using a humidifier in your bedroom at night and sleeping elevated on pillows while cough symptoms persist. Go to the emergency department immediately if you experience worsening shortness of breath, difficulty breathing, cannot speak in a complete sentence, or have other concerns. Please follow-up with your primary care physician within the next 3 to 4 weeks to have a repeat chest x-ray performed to ensure resolution of the pneumonia. Follow-up as needed.

## 2022-09-28 NOTE — ED Triage Notes (Signed)
Productive Cough, SOB x 2 weeks.  States symptoms have become worse over the past few days.  C/o left ear pain.

## 2022-09-28 NOTE — ED Provider Notes (Signed)
[facility]    CSN: [id number] Arrival date & time: 09/28/22  0240      History   Chief Complaint No chief complaint on file.   HPI [name] is a 57 y.o. female.   The history is provided by the patient.   She presents for complaints of cough, shortness of breath, wheezing, left ear pain, and scratchy throat.  Patient states cough has been persistent over the last 2 weeks, which has progressively worsened.  She states over the last few days, she has developed the additional symptoms.  Patient denies fever, but states that she did wake up in a cold sweat a few nights ago.  She states that she has not had any headache, abdominal pain, nausea, vomiting, or diarrhea.  Patient reports a history of COPD.  She reports that she takes Symbicort for her symptoms.  She does not have a rescue inhaler per her report.  She also does not have a pulmonologist as he retired several years ago.  States COPD is currently being managed by her primary care physician.  Past Medical History:  Diagnosis Date   Arthritis    Back pain 10/30/2015   Bilateral shoulder pain 10/30/2015   COPD (chronic obstructive pulmonary disease) (HCC)    Fecal occult blood test positive 10/30/2015   Hemorrhoids 10/30/2015   Hypertension    Hypothyroidism    Large breasts 10/30/2015   43 DD   Postmenopausal 10/30/2015   Sleep apnea    pt DX but did not want to use CPAP.   Thyroid disease     Patient Active Problem List   Diagnosis Date Noted   Screening for colorectal cancer 12/07/2018   Encounter for gynecological examination with Papanicolaou smear of cervix 97/35/3299   Lichen planus 24/26/8341   Primary osteoarthritis of both hands 12/04/2017   Primary osteoarthritis of right knee 12/04/2017   Primary osteoarthritis of both feet 12/04/2017   History of hypothyroidism 12/04/2017   Dyslipidemia 12/04/2017   Status post total left knee replacement 01/29/17 10/14/2017   Special screening for malignant  neoplasms, colon    Hemorrhoids 10/30/2015   Fecal occult blood test positive 10/30/2015   Postmenopausal 10/30/2015   Large breasts 10/30/2015   Bilateral shoulder pain 10/30/2015   Back pain 10/30/2015   Medial meniscus tear    Lateral meniscus tear, current    Erosive lichen planus of vulva 08/28/2014   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   Right hip flexor tightness 12/06/2013   Plantar fasciitis 12/06/2013    Past Surgical History:  Procedure Laterality Date   BREAST REDUCTION SURGERY Bilateral 04/17/2020   Procedure: MAMMARY REDUCTION  (BREAST);  Surgeon: Cindra Presume, MD;  Location: [facility];  Service: Plastics;  Laterality: Bilateral;  2.5 hours, please   COLONOSCOPY N/A 11/12/2015   Procedure: COLONOSCOPY;  Surgeon: Danie Binder, MD;  Location: [facility];  Service: Endoscopy;  Laterality: N/A;  8:30 AM   JOINT REPLACEMENT     KNEE ARTHROSCOPY WITH MEDIAL MENISECTOMY Left 05/04/2015   Procedure: KNEE ARTHROSCOPY WITH MEDIAL AND LATERAL MENISECTOMY;  Surgeon: Carole Civil, MD;  Location: [facility];  Service: Orthopedics;  Laterality: Left;   NASAL SINUS SURGERY     ROTATOR CUFF REPAIR     SHOULDER ARTHROSCOPY Right 10/12/2018   right rotator cuff repair    SHOULDER SURGERY Right 10/12/2018   rotator cuff repair    TOTAL KNEE ARTHROPLASTY Left 01/29/2017   Procedure: TOTAL KNEE ARTHROPLASTY;  Surgeon: Vickki Hearing, MD;  Location: [facility];  Service: Orthopedics;  Laterality: Left;    OB History     Gravida  0   Para  0   Term  0   Preterm  0   AB  0   Living  0      SAB  0   IAB  0   Ectopic  0   Multiple  0   Live Births               Home Medications    Prior to Admission medications   Medication Sig Start Date End Date Taking? Authorizing Provider  albuterol (VENTOLIN HFA) 108 (90 Base) MCG/ACT inhaler Inhale 2 puffs into the lungs every 6 (six) hours as needed for wheezing or shortness of breath. 09/28/22  Yes  Jadakiss Barish-Warren, Sadie Haber, NP  amoxicillin-clavulanate (AUGMENTIN) 875-125 MG tablet Take 1 tablet by mouth every 12 (twelve) hours. 09/28/22  Yes Joelie Schou-Warren, Sadie Haber, NP  azithromycin (ZITHROMAX) 250 MG tablet Take 1 tablet (250 mg total) by mouth daily. Take first 2 tablets together, then 1 every day until finished. 09/28/22  Yes Eydan Chianese-Warren, Sadie Haber, NP  fluconazole (DIFLUCAN) 150 MG tablet Take 1 tablet (150 mg total) by mouth once for 1 dose. Take 1 tablet by mouth at the first signs of a yeast infection.  May repeat with the second tablet in 72 hours if symptoms do not improve. 09/28/22 09/28/22 Yes Chanelle Hodsdon-Warren, Sadie Haber, NP  predniSONE (DELTASONE) 20 MG tablet Take 2 tablets (40 mg total) by mouth daily with breakfast for 5 days. 09/28/22 10/03/22 Yes Virginia Francisco-Warren, Sadie Haber, NP  promethazine-dextromethorphan (PROMETHAZINE-DM) 6.25-15 MG/5ML syrup Take 5 mLs by mouth 4 (four) times daily as needed for cough. 09/28/22  Yes Ayleen Mckinstry-Warren, Sadie Haber, NP  Acetaminophen (TYLENOL PO) Take by mouth as needed.     [provider]  budesonide-formoterol (SYMBICORT) 80-4.5 MCG/ACT inhaler Inhale 2 puffs into the lungs 2 (two) times daily.    [provider]  buPROPion (WELLBUTRIN XL) 150 MG 24 hr tablet Take 150 mg by mouth daily. 03/12/20   [provider]  Cholecalciferol (VITAMIN D PO) Take by mouth daily.    [provider]  cyclobenzaprine (FLEXERIL) 10 MG tablet Take 10 mg by mouth 3 (three) times daily as needed for muscle spasms.    [provider]  diclofenac (VOLTAREN) 75 MG EC tablet Take 75 mg by mouth 2 (two) times daily.    [provider]  dicyclomine (BENTYL) 20 MG tablet Take 1 tablet (20 mg total) by mouth 3 (three) times daily as needed for spasms (abdominal cramping). 05/13/22   Long, Arlyss Repress, MD  escitalopram (LEXAPRO) 20 MG tablet Take 25 mg by mouth daily.    [provider]  hydrochlorothiazide (HYDRODIURIL) 25 MG tablet  Take 25 mg by mouth daily.     [provider]  levothyroxine (SYNTHROID, LEVOTHROID) 25 MCG tablet Take 25 mcg by mouth daily before breakfast.    [provider]  MAGNESIUM PO Take by mouth daily.    [provider]  Omega-3 Fatty Acids (FISH OIL PO) Take by mouth daily.    [provider]  ondansetron (ZOFRAN-ODT) 4 MG disintegrating tablet Take 1 tablet (4 mg total) by mouth every 8 (eight) hours as needed for nausea or vomiting. 05/13/22   Long, Arlyss Repress, MD  oxyCODONE-acetaminophen (PERCOCET/ROXICET) 5-325 MG tablet Take 1 tablet by mouth every 6 (six) hours as needed  for severe pain. 05/13/22   Long, Arlyss Repress, MD  senna-docusate (SENOKOT-S) 8.6-50 MG tablet Take 1 tablet by mouth at bedtime as needed for mild constipation or moderate constipation. 05/13/22   Long, Arlyss Repress, MD    Family History Family History  Problem Relation Age of Onset   Alzheimer's disease Mother    Cancer Father        brain tumor   Diabetes Brother    Heart disease Brother    Heart attack Brother    Cancer Brother        melanoma   Lupus Maternal Grandmother    Heart disease Maternal Grandmother    Heart disease Maternal Grandfather    Hypertension Maternal Grandfather    Breast cancer Maternal Aunt        unsure of age    Breast cancer Cousin     Social History Social History   Tobacco Use   Smoking status: Never   Smokeless tobacco: Never  Vaping Use   Vaping Use: Never used  Substance Use Topics   Alcohol use: Yes    Comment: rare   Drug use: No     Allergies   Penicillins   Review of Systems Review of Systems Per HPI  Physical Exam Triage Vital Signs ED Triage Vitals [09/28/22 0812]  Enc Vitals Group     BP 137/84     Pulse Rate 78     Resp 18     Temp 97.8 F (36.6 C)     Temp Source Oral     SpO2 98 %     Weight      Height      Head Circumference      Peak Flow      Pain Score 6     Pain Loc      Pain Edu?      Excl. in GC?     No data found.  Updated Vital Signs BP 137/84 (BP Location: Right Arm)   Pulse 84   Temp 97.8 F (36.6 C) (Oral)   Resp 18   LMP 04/14/2011   SpO2 99%   Visual Acuity Right Eye Distance:   Left Eye Distance:   Bilateral Distance:    Right Eye Near:   Left Eye Near:    Bilateral Near:     Physical Exam Vitals and nursing note reviewed.  Constitutional:      General: She is not in acute distress.    Appearance: Normal appearance.  HENT:     Head: Normocephalic.     Right Ear: Tympanic membrane, ear canal and external ear normal.     Left Ear: Ear canal and external ear normal.     Nose: Congestion present. No rhinorrhea.     Mouth/Throat:     Mouth: Mucous membranes are moist.     Pharynx: Posterior oropharyngeal erythema present.  Eyes:     Extraocular Movements: Extraocular movements intact.     Conjunctiva/sclera: Conjunctivae normal.     Pupils: Pupils are equal, round, and reactive to light.  Cardiovascular:     Rate and Rhythm: Normal rate and regular rhythm.     Pulses: Normal pulses.     Heart sounds: Normal heart sounds.  Pulmonary:     Effort: Pulmonary effort is normal.     Breath sounds: Examination of the right-upper field reveals wheezing. Examination of the left-upper field reveals wheezing. Examination of the right-middle field reveals rales. Examination of the right-lower field  reveals wheezing. Examination of the left-lower field reveals wheezing. Decreased breath sounds, wheezing and rales present.     Comments: Expiratory wheezing noted in the posterior lung fields. Abdominal:     General: Bowel sounds are normal.     Palpations: Abdomen is soft.     Tenderness: There is no abdominal tenderness.  Musculoskeletal:     Cervical back: Normal range of motion.  Lymphadenopathy:     Cervical: No cervical adenopathy.  Skin:    General: Skin is warm and dry.  Neurological:     General: No focal deficit present.     Mental Status: She is alert and  oriented to person, place, and time.  Psychiatric:        Mood and Affect: Mood normal.        Behavior: Behavior normal.      UC Treatments / Results  Labs (all labs ordered are listed, but only abnormal results are displayed) Labs Reviewed - No data to display  EKG   Radiology DG Chest 2 View  Result Date: 09/28/2022 CLINICAL DATA:  Cough. EXAM: CHEST - 2 VIEW COMPARISON:  October 27, 2017 FINDINGS: A right middle lobe infiltrate is identified. The heart, hila, mediastinum, lungs, and pleura are otherwise normal. No pneumothorax. IMPRESSION: A new right middle lobe infiltrate is worrisome for pneumonia given history. Recommend short-term follow-up imaging to ensure resolution. Electronically Signed   By: Gerome Sam III M.D.   On: 09/28/2022 09:00    Procedures Procedures (including critical care time)  Medications Ordered in UC Medications  albuterol (PROVENTIL) (2.5 MG/3ML) 0.083% nebulizer solution 2.5 mg (2.5 mg Nebulization Given 09/28/22 0849)  methylPREDNISolone sodium succinate (SOLU-MEDROL) 125 mg/2 mL injection 125 mg (125 mg Intramuscular Given 09/28/22 0849)    Initial Impression / Assessment and Plan / UC Course  I have reviewed the triage vital signs and the nursing notes.  Pertinent labs & imaging results that were available during my care of the patient were reviewed by me and considered in my medical decision making (see chart for details).  The patient is normotensive, is in no acute distress, and speaking in complete sentences.  Chest x-ray shows a right middle lobe pneumonia.  Will start patient on Augmentin 875/125 and azithromycin 250 mg for infection.  For her shortness of breath, and albuterol inhaler was prescribed along with prednisone 40 mg.  Patient was also prescribed fluconazole 150 mg for possible yeast infection as result of the antibiotic.  With the cough, Promethazine DM was prescribed.  Supportive care recommendations were provided to the  patient along with strict ER precautions.  Patient was advised to follow-up with her PCP within the next 3 to 4 weeks for repeat chest x-ray to ensure resolution of the pneumonia.  Patient verbalizes understanding.  All questions were answered.  Patient is stable for discharge.   Final Clinical Impressions(s) / UC Diagnoses   Final diagnoses:  Pneumonia of right middle lobe due to infectious organism  History of COPD     Discharge Instructions      The chest x-ray shows that you have a right middle lobe pneumonia. Take medication as prescribed. Increase fluids and allow for plenty of rest. May take over-the-counter Tylenol as needed for pain, fever, or general discomfort. Recommend using a humidifier in your bedroom at night and sleeping elevated on pillows while cough symptoms persist. Go to the emergency department immediately if you experience worsening shortness of breath, difficulty breathing, cannot speak in a complete  sentence, or have other concerns. Please follow-up with your primary care physician within the next 3 to 4 weeks to have a repeat chest x-ray performed to ensure resolution of the pneumonia. Follow-up as needed.     ED Prescriptions     Medication Sig Dispense Auth. Provider   amoxicillin-clavulanate (AUGMENTIN) 875-125 MG tablet Take 1 tablet by mouth every 12 (twelve) hours. 14 tablet Burech Mcfarland-Warren, Sadie Haber, NP   azithromycin (ZITHROMAX) 250 MG tablet Take 1 tablet (250 mg total) by mouth daily. Take first 2 tablets together, then 1 every day until finished. 6 tablet Omaria Plunk-Warren, Sadie Haber, NP   predniSONE (DELTASONE) 20 MG tablet Take 2 tablets (40 mg total) by mouth daily with breakfast for 5 days. 10 tablet Maille Halliwell-Warren, Sadie Haber, NP   albuterol (VENTOLIN HFA) 108 (90 Base) MCG/ACT inhaler Inhale 2 puffs into the lungs every 6 (six) hours as needed for wheezing or shortness of breath. 8 g Lenin Kuhnle-Warren, Sadie Haber, NP   fluconazole (DIFLUCAN) 150 MG  tablet Take 1 tablet (150 mg total) by mouth once for 1 dose. Take 1 tablet by mouth at the first signs of a yeast infection.  May repeat with the second tablet in 72 hours if symptoms do not improve. 2 tablet Abia Monaco-Warren, Sadie Haber, NP   promethazine-dextromethorphan (PROMETHAZINE-DM) 6.25-15 MG/5ML syrup Take 5 mLs by mouth 4 (four) times daily as needed for cough. 118 mL Lacie Landry-Warren, Sadie Haber, NP      PDMP not reviewed this encounter.   Abran Cantor, NP 09/28/22 [phone]

## 2022-10-03 DIAGNOSIS — E6609 Other obesity due to excess calories: Secondary | ICD-10-CM | POA: Diagnosis not present

## 2022-10-03 DIAGNOSIS — I10 Essential (primary) hypertension: Secondary | ICD-10-CM | POA: Diagnosis not present

## 2022-10-03 DIAGNOSIS — Z6839 Body mass index (BMI) 39.0-39.9, adult: Secondary | ICD-10-CM | POA: Diagnosis not present

## 2022-10-03 DIAGNOSIS — J189 Pneumonia, unspecified organism: Secondary | ICD-10-CM | POA: Diagnosis not present

## 2022-10-03 DIAGNOSIS — J449 Chronic obstructive pulmonary disease, unspecified: Secondary | ICD-10-CM | POA: Diagnosis not present

## 2022-10-20 ENCOUNTER — Encounter: Payer: Self-pay | Admitting: Orthopedic Surgery

## 2022-10-20 ENCOUNTER — Ambulatory Visit: Payer: Medicaid Other | Admitting: Orthopedic Surgery

## 2022-10-20 DIAGNOSIS — M48061 Spinal stenosis, lumbar region without neurogenic claudication: Secondary | ICD-10-CM | POA: Diagnosis not present

## 2022-10-20 DIAGNOSIS — M545 Low back pain, unspecified: Secondary | ICD-10-CM

## 2022-10-20 NOTE — Progress Notes (Unsigned)
Chief Complaint  Patient presents with   Follow-up    Recheck on back pain    Follow-up after PT  Patient with spinal stenosis without neurogenic claudication had physical therapy and did not get a significant amount of improvement   back pain which increases and advances to the left and right leg pain when walking relieved by sit   Physical Exam Vitals and nursing note reviewed.  Constitutional:      Appearance: Normal appearance.  HENT:     Head: Normocephalic and atraumatic.  Eyes:     General: No scleral icterus.       Right eye: No discharge.        Left eye: No discharge.     Extraocular Movements: Extraocular movements intact.     Conjunctiva/sclera: Conjunctivae normal.     Pupils: Pupils are equal, round, and reactive to light.  Cardiovascular:     Rate and Rhythm: Normal rate.     Pulses: Normal pulses.  Musculoskeletal:     Lumbar back: Spasms, tenderness and bony tenderness present. No swelling, deformity or lacerations. Decreased range of motion. Negative right straight leg raise test and negative left straight leg raise test.  Skin:    General: Skin is warm and dry.     Capillary Refill: Capillary refill takes less than 2 seconds.  Neurological:     General: No focal deficit present.     Mental Status: She is alert and oriented to person, place, and time.  Psychiatric:        Mood and Affect: Mood normal.        Behavior: Behavior normal.        Thought Content: Thought content normal.        Judgment: Judgment normal.     Recommend that we proceed with MRI lumbar spine to evaluate the neural elements spinal cord and prepare for epidural injections versus need for neurosurgical referral

## 2022-11-04 ENCOUNTER — Telehealth: Payer: Self-pay

## 2022-11-04 NOTE — Telephone Encounter (Signed)
Mychart msg sent. AS, CMA

## 2022-11-11 DIAGNOSIS — M79672 Pain in left foot: Secondary | ICD-10-CM | POA: Diagnosis not present

## 2022-11-11 DIAGNOSIS — M722 Plantar fascial fibromatosis: Secondary | ICD-10-CM | POA: Diagnosis not present

## 2022-11-11 DIAGNOSIS — M79671 Pain in right foot: Secondary | ICD-10-CM | POA: Diagnosis not present

## 2022-11-12 ENCOUNTER — Ambulatory Visit (HOSPITAL_COMMUNITY)
Admission: RE | Admit: 2022-11-12 | Discharge: 2022-11-12 | Disposition: A | Discharge status: Home / Self Care | Payer: Medicaid Other | Source: Ambulatory Visit | Attending: Orthopedic Surgery | Admitting: Orthopedic Surgery

## 2022-11-12 DIAGNOSIS — M5126 Other intervertebral disc displacement, lumbar region: Secondary | ICD-10-CM | POA: Diagnosis not present

## 2022-11-12 DIAGNOSIS — M48061 Spinal stenosis, lumbar region without neurogenic claudication: Secondary | ICD-10-CM | POA: Diagnosis not present

## 2022-11-12 DIAGNOSIS — M4316 Spondylolisthesis, lumbar region: Secondary | ICD-10-CM | POA: Diagnosis not present

## 2022-11-20 ENCOUNTER — Ambulatory Visit: Payer: Medicaid Other | Admitting: Orthopedic Surgery

## 2022-11-20 ENCOUNTER — Ambulatory Visit (INDEPENDENT_AMBULATORY_CARE_PROVIDER_SITE_OTHER): Payer: Medicaid Other

## 2022-11-20 ENCOUNTER — Encounter: Payer: Self-pay | Admitting: Radiology

## 2022-11-20 VITALS — Ht 62.0 in | Wt 226.0 lb

## 2022-11-20 DIAGNOSIS — M48061 Spinal stenosis, lumbar region without neurogenic claudication: Secondary | ICD-10-CM | POA: Diagnosis not present

## 2022-11-20 DIAGNOSIS — M1712 Unilateral primary osteoarthritis, left knee: Secondary | ICD-10-CM

## 2022-11-20 DIAGNOSIS — Z96652 Presence of left artificial knee joint: Secondary | ICD-10-CM

## 2022-11-20 DIAGNOSIS — Z6839 Body mass index (BMI) 39.0-39.9, adult: Secondary | ICD-10-CM | POA: Diagnosis not present

## 2022-11-20 DIAGNOSIS — M1711 Unilateral primary osteoarthritis, right knee: Secondary | ICD-10-CM | POA: Diagnosis not present

## 2022-11-20 DIAGNOSIS — G8929 Other chronic pain: Secondary | ICD-10-CM

## 2022-11-20 DIAGNOSIS — M222X2 Patellofemoral disorders, left knee: Secondary | ICD-10-CM | POA: Diagnosis not present

## 2022-11-20 DIAGNOSIS — N3941 Urge incontinence: Secondary | ICD-10-CM | POA: Diagnosis not present

## 2022-11-20 DIAGNOSIS — I10 Essential (primary) hypertension: Secondary | ICD-10-CM | POA: Diagnosis not present

## 2022-11-20 DIAGNOSIS — E063 Autoimmune thyroiditis: Secondary | ICD-10-CM | POA: Diagnosis not present

## 2022-11-20 DIAGNOSIS — M47816 Spondylosis without myelopathy or radiculopathy, lumbar region: Secondary | ICD-10-CM

## 2022-11-20 DIAGNOSIS — F329 Major depressive disorder, single episode, unspecified: Secondary | ICD-10-CM | POA: Diagnosis not present

## 2022-11-20 DIAGNOSIS — J449 Chronic obstructive pulmonary disease, unspecified: Secondary | ICD-10-CM | POA: Diagnosis not present

## 2022-11-20 MED ORDER — METHYLPREDNISOLONE ACETATE 40 MG/ML IJ SUSP
40.0000 mg | Freq: Once | INTRAMUSCULAR | Status: AC
  Administered 2022-11-20: 40 mg via INTRA_ARTICULAR

## 2022-11-20 NOTE — Progress Notes (Signed)
Chief Complaint  Patient presents with   Back Pain   Results    Review MRI      Tarmara is still complaining of primarily right sided lower back pain with radiation to the right knee associated with walking.  She reports that she uses a grocery cart  We treated her nonoperatively and she has physical therapy, the back pain and radicular pain are still prominent  She also complains over the left knee which was replaced in 2018      The MRI basically shows facet arthritis.  The facet arthritis is at multiple levels there is some foraminal narrowing at L3-4 on the left side but she has no left sided symptoms  Anterolisthesis L4-5,  L5-S1 but no stenosis  It seems that the symptoms are more consistent with the facet arthritis without any symptoms on the left side   The left total knee was done in 2018, last x-ray was 2023 x-ray will be repeated to evaluate the "knot"  Also evaluation right knee x-ray has not been done in 3 to 4 years    Physical Exam Vitals and nursing note reviewed.  Constitutional:      Appearance: Normal appearance.  HENT:     Head: Normocephalic and atraumatic.  Eyes:     General: No scleral icterus.       Right eye: No discharge.        Left eye: No discharge.     Extraocular Movements: Extraocular movements intact.     Conjunctiva/sclera: Conjunctivae normal.     Pupils: Pupils are equal, round, and reactive to light.  Cardiovascular:     Rate and Rhythm: Normal rate.     Pulses: Normal pulses.  Skin:    General: Skin is warm and dry.     Capillary Refill: Capillary refill takes less than 2 seconds.  Neurological:     General: No focal deficit present.     Mental Status: She is alert and oriented to person, place, and time.  Psychiatric:        Mood and Affect: Mood normal.        Behavior: Behavior normal.        Thought Content: Thought content normal.        Judgment: Judgment normal.    Left knee  No effusion Lipoma anterolateral knee  just below the joint line Arc of motion 120  X-ray negative for fracture dislocation or loosening  Right knee Crepitance Periarticular tenderness Arc of motion 115 degrees Instability none detected  X-ray subluxation peripheral osteophytes joint space narrowing grade 3 disease  Injection  Procedure note right knee injection   verbal consent was obtained to inject right knee joint  Timeout was completed to confirm the site of injection  The medications used were depomedrol 40 mg and 1% lidocaine 3 cc Anesthesia was provided by ethyl chloride and the skin was prepped with alcohol.  After cleaning the skin with alcohol a 20-gauge needle was used to inject the right knee joint. There were no complications. A sterile bandage was applied.  Recommend 10 pounds of weight loss in preparation for knee replacement Take diclofenac as soon as medically feasible   Spine  Recommend referral for further treatment   MRI REPORT REVIEW Disc desiccation and up to mild disc space narrowing throughout the lumbar spine.   T12-L1: Only imaged sagittally and negative.   L1-2: Mild disc bulging and mild facet hypertrophy without stenosis.   L2-3: Disc bulging and mild-to-moderate  facet hypertrophy without stenosis.   L3-4: Disc bulging, endplate spurring, and mild-to-moderate facet hypertrophy result in mild left neural foraminal stenosis without spinal stenosis.   L4-5: Anterolisthesis with bulging uncovered disc and moderate to severe facet hypertrophy without stenosis.   L5-S1: Anterolisthesis with disc uncovering and severe facet hypertrophy without stenosis.   IMPRESSION: 1. Mild diffuse disc degeneration and advanced lower lumbar facet arthrosis without spinal stenosis. 2. Mild left neural foraminal stenosis at L3-4.     Electronically Signed   By: Logan Bores M.D.   On: 11/15/2022 11:15

## 2022-11-20 NOTE — Patient Instructions (Signed)
Address: [address], [place], [place] [address] Phone: [phone] Dr Izora Ribas is the name of the Neurosurgeon with [facility] health, and his office will call you with appointment

## 2022-11-20 NOTE — Addendum Note (Signed)
Addended byCandice Camp on: 11/20/2022 05:08 PM   Modules accepted: Orders

## 2022-12-04 NOTE — Progress Notes (Signed)
Referring Physician:  Carole Civil, MD [address] [place],  [place] [facility] [address]  Primary Physician:  Sharilyn Sites, MD  History of Present Illness: 12/08/2022 [name] has a history of hypothyroidism, dyslipidemia,   She has been seeing Dr. Aline Brochure for right sided LBP into right leg. She needs a right TKA. She's had left TKA done in 2018.   She has 9 month history of constant right sided LBP with right leg pain lateral to her knee. No left leg pain. Pain is worse with walking. After about 5 minutes she needs to stop. Some relief with grocery cart and with sitting. No numbness, tingling. She notes weakness in right leg, no giving way.   She has relief with voltaren.   No improvement with PT.   Bowel/Bladder Dysfunction: none  Conservative measures:  Physical therapy: initial eval 08/12/22 and did 7 visits. Was discharged on 10/01/22 ([facility]).  Multimodal medical therapy including regular antiinflammatories:  Tylenol, voltaren Injections: No epidural steroid injections  Past Surgery: No spinal surgery.   Attison Giron has no symptoms of cervical myelopathy.  The symptoms are causing a significant impact on the patient's life.   Review of Systems:  A 10 point review of systems is negative, except for the pertinent positives and negatives detailed in the HPI.  Past Medical History: Past Medical History:  Diagnosis Date   Arthritis    Back pain 10/30/2015   Bilateral shoulder pain 10/30/2015   COPD (chronic obstructive pulmonary disease) (HCC)    Fecal occult blood test positive 10/30/2015   Hemorrhoids 10/30/2015   Hypertension    Hypothyroidism    Large breasts 10/30/2015   43 DD   Postmenopausal 10/30/2015   Sleep apnea    pt DX but did not want to use CPAP.   Thyroid disease     Past Surgical History: Past Surgical History:  Procedure Laterality Date   BREAST REDUCTION SURGERY Bilateral 04/17/2020   Procedure: MAMMARY REDUCTION  (BREAST);  Surgeon:  Cindra Presume, MD;  Location: [facility];  Service: Plastics;  Laterality: Bilateral;  2.5 hours, please   COLONOSCOPY N/A 11/12/2015   Procedure: COLONOSCOPY;  Surgeon: Danie Binder, MD;  Location: [facility];  Service: Endoscopy;  Laterality: N/A;  8:30 AM   JOINT REPLACEMENT     KNEE ARTHROSCOPY WITH MEDIAL MENISECTOMY Left 05/04/2015   Procedure: KNEE ARTHROSCOPY WITH MEDIAL AND LATERAL MENISECTOMY;  Surgeon: Carole Civil, MD;  Location: [facility];  Service: Orthopedics;  Laterality: Left;   NASAL SINUS SURGERY     ROTATOR CUFF REPAIR     SHOULDER ARTHROSCOPY Right 10/12/2018   right rotator cuff repair    SHOULDER SURGERY Right 10/12/2018   rotator cuff repair    TOTAL KNEE ARTHROPLASTY Left 01/29/2017   Procedure: TOTAL KNEE ARTHROPLASTY;  Surgeon: Carole Civil, MD;  Location: [facility];  Service: Orthopedics;  Laterality: Left;    Allergies: Allergies as of 12/08/2022 - Review Complete 12/08/2022  Allergen Reaction Noted   Penicillins Other (See Comments) 06/11/2012    Medications: Outpatient Encounter Medications as of 12/08/2022  Medication Sig   Acetaminophen (TYLENOL PO) Take by mouth as needed.    albuterol (VENTOLIN HFA) 108 (90 Base) MCG/ACT inhaler Inhale 2 puffs into the lungs every 6 (six) hours as needed for wheezing or shortness of breath.   budesonide-formoterol (SYMBICORT) 80-4.5 MCG/ACT inhaler Inhale 2 puffs into the lungs 2 (two) times daily.   buPROPion (WELLBUTRIN XL)  150 MG 24 hr tablet Take 150 mg by mouth daily.   diclofenac (VOLTAREN) 75 MG EC tablet Take 75 mg by mouth 2 (two) times daily.   escitalopram (LEXAPRO) 20 MG tablet Take 25 mg by mouth daily.   hydrochlorothiazide (HYDRODIURIL) 25 MG tablet Take 25 mg by mouth daily.    levothyroxine (SYNTHROID, LEVOTHROID) 25 MCG tablet Take 25 mcg by mouth daily before breakfast.   MAGNESIUM PO Take by mouth daily.   Omega-3 Fatty Acids (FISH OIL PO) Take by mouth daily.    [DISCONTINUED] Cholecalciferol (VITAMIN D PO) Take by mouth daily.   [DISCONTINUED] cyclobenzaprine (FLEXERIL) 10 MG tablet Take 10 mg by mouth 3 (three) times daily as needed for muscle spasms.   [DISCONTINUED] dicyclomine (BENTYL) 20 MG tablet Take 1 tablet (20 mg total) by mouth 3 (three) times daily as needed for spasms (abdominal cramping).   [DISCONTINUED] ondansetron (ZOFRAN-ODT) 4 MG disintegrating tablet Take 1 tablet (4 mg total) by mouth every 8 (eight) hours as needed for nausea or vomiting.   [DISCONTINUED] oxyCODONE-acetaminophen (PERCOCET/ROXICET) 5-325 MG tablet Take 1 tablet by mouth every 6 (six) hours as needed for severe pain.   [DISCONTINUED] promethazine-dextromethorphan (PROMETHAZINE-DM) 6.25-15 MG/5ML syrup Take 5 mLs by mouth 4 (four) times daily as needed for cough. (Patient not taking: Reported on 10/20/2022)   [DISCONTINUED] senna-docusate (SENOKOT-S) 8.6-50 MG tablet Take 1 tablet by mouth at bedtime as needed for mild constipation or moderate constipation.   No facility-administered encounter medications on file as of 12/08/2022.    Social History: Social History   Tobacco Use   Smoking status: Never   Smokeless tobacco: Never  Vaping Use   Vaping Use: Never used  Substance Use Topics   Alcohol use: Yes    Comment: rare   Drug use: No    Family Medical History: Family History  Problem Relation Age of Onset   Alzheimer's disease Mother    Cancer Father        brain tumor   Diabetes Brother    Heart disease Brother    Heart attack Brother    Cancer Brother        melanoma   Lupus Maternal Grandmother    Heart disease Maternal Grandmother    Heart disease Maternal Grandfather    Hypertension Maternal Grandfather    Breast cancer Maternal Aunt        unsure of age    Breast cancer Cousin     Physical Examination: Vitals:   12/08/22 1418  BP: 117/72  Pulse: 72  SpO2: 98%    General: Patient is well developed, well nourished, calm, collected,  and in no apparent distress. Attention to examination is appropriate.  Respiratory: Patient is breathing without any difficulty.   NEUROLOGICAL:     Awake, alert, oriented to person, place, and time.  Speech is clear and fluent. Fund of knowledge is appropriate.   Cranial Nerves: Pupils equal round and reactive to light.  Facial tone is symmetric.    Reasonable ROM of lumbar spine pain with pain on extension Mild diffuse posterior lumbar tenderness.   No abnormal lesions on exposed skin.   Strength: Side Biceps Triceps Deltoid Interossei Grip Wrist Ext. Wrist Flex.  R 5 5 5 5 5 5 5   L 5 5 5 5 5 5 5    Side Iliopsoas Quads Hamstring PF DF EHL  R 5 5 5 5 5 5   L 5 5 5 5 5 5    Reflexes are 2+  and symmetric at the biceps, triceps, brachioradialis, patella and achilles.   Hoffman's is absent.  Clonus is not present.   Bilateral upper and lower extremity sensation is intact to light touch.     Gait is normal.    Medical Decision Making  Imaging: Lumbar MRI dated 11/12/22:  FINDINGS: Segmentation:  Standard.   Alignment: Facet mediated anterolisthesis of L4 on L5 and L5 on S1 measuring 2 and 3 mm, respectively.   Vertebrae: No fracture or suspicious marrow lesion. Mild right facet edema at L4-5.   Conus medullaris and cauda equina: Conus extends to the T12 level. Conus and cauda equina appear normal.   Paraspinal and other soft tissues: Unremarkable.   Disc levels:   Disc desiccation and up to mild disc space narrowing throughout the lumbar spine.   T12-L1: Only imaged sagittally and negative.   L1-2: Mild disc bulging and mild facet hypertrophy without stenosis.   L2-3: Disc bulging and mild-to-moderate facet hypertrophy without stenosis.   L3-4: Disc bulging, endplate spurring, and mild-to-moderate facet hypertrophy result in mild left neural foraminal stenosis without spinal stenosis.   L4-5: Anterolisthesis with bulging uncovered disc and moderate to severe  facet hypertrophy without stenosis.   L5-S1: Anterolisthesis with disc uncovering and severe facet hypertrophy without stenosis.   IMPRESSION: 1. Mild diffuse disc degeneration and advanced lower lumbar facet arthrosis without spinal stenosis. 2. Mild left neural foraminal stenosis at L3-4.     Electronically Signed   By: Logan Bores M.D.   On: 11/15/2022 11:15  I have personally reviewed the images and agree with the above interpretation.  Assessment and Plan: [name] is a pleasant 57 y.o. female has 9 month history of constant right sided LBP with right leg pain lateral to her knee. No left leg pain. Pain is worse with walking.   She has known slip at L4-L5 and L5-S1 with multilevel lumbar spondylosis. No significant central stenosis. Mild left foraminal stenosis L3-L4.   LBP likely multifactorial and from lumbar spondylosis/facet hypertrophy. Not sure of cause of right leg pain.   Treatment options discussed with patient and following plan made:   - No previous improvement with PT.  - Xrays ordered of lumbar spine to further evaluate spondylolisthesis L4-L5 and L5-S1.  - Referral to pain management ([facility]) to consider lumbar injections (?facet injections).  - She is not interested in any spinal surgery at this time.  - Will message her with xray results and decide on further follow up at that time.  - If no improvement in right leg pain with injections, may consider EMG.   I spent a total of 30 minutes in face-to-face and non-face-to-face activities related to this patient's care today including review of outside records, review of imaging, review of symptoms, physical exam, discussion of differential diagnosis, discussion of treatment options, and documentation.   Thank you for involving me in the care of this patient.   Geronimo Boot PA-C Dept. of Neurosurgery

## 2022-12-08 ENCOUNTER — Ambulatory Visit
Admission: RE | Admit: 2022-12-08 | Discharge: 2022-12-08 | Disposition: A | Discharge status: Home / Self Care | Payer: Medicaid Other | Source: Ambulatory Visit | Attending: Orthopedic Surgery | Admitting: Orthopedic Surgery

## 2022-12-08 ENCOUNTER — Encounter: Payer: Self-pay | Admitting: Orthopedic Surgery

## 2022-12-08 ENCOUNTER — Ambulatory Visit: Payer: Medicaid Other | Admitting: Orthopedic Surgery

## 2022-12-08 ENCOUNTER — Ambulatory Visit
Admission: RE | Admit: 2022-12-08 | Discharge: 2022-12-08 | Disposition: A | Discharge status: Home / Self Care | Payer: Medicaid Other | Attending: Orthopedic Surgery | Admitting: Orthopedic Surgery

## 2022-12-08 VITALS — BP 117/72 | HR 72 | Ht 62.5 in | Wt 226.0 lb

## 2022-12-08 DIAGNOSIS — M5416 Radiculopathy, lumbar region: Secondary | ICD-10-CM | POA: Insufficient documentation

## 2022-12-08 DIAGNOSIS — M4316 Spondylolisthesis, lumbar region: Secondary | ICD-10-CM

## 2022-12-08 DIAGNOSIS — M47816 Spondylosis without myelopathy or radiculopathy, lumbar region: Secondary | ICD-10-CM

## 2022-12-08 DIAGNOSIS — M545 Low back pain, unspecified: Secondary | ICD-10-CM | POA: Diagnosis not present

## 2022-12-08 NOTE — Patient Instructions (Signed)
It was so nice to see you today. Thank you so much for coming in.    You have some wear and tear (arthritis) in your back and this is likely causing your pain.   I ordered xrays of your lumbar spine. You can get these at [facility] (building with the white pillars) off of [address]. The address is [address] [place], [place] [address]. You do not need any appointment. I will message you with results.    I want you to see pain management here in [place] (Dr. Holley Raring) to discuss possible injections. They should call you to schedule an appointment or you can call them at [phone].   Please do not hesitate to call if you have any questions or concerns. You can also message me in [place].   Geronimo Boot PA-C [phone]

## 2022-12-30 DIAGNOSIS — M79672 Pain in left foot: Secondary | ICD-10-CM | POA: Diagnosis not present

## 2022-12-30 DIAGNOSIS — M722 Plantar fascial fibromatosis: Secondary | ICD-10-CM | POA: Diagnosis not present

## 2023-01-29 ENCOUNTER — Telehealth: Payer: Medicaid Other | Admitting: Orthopedic Surgery

## 2023-02-03 ENCOUNTER — Encounter: Payer: Self-pay | Admitting: Student in an Organized Health Care Education/Training Program

## 2023-02-03 ENCOUNTER — Ambulatory Visit
Payer: Medicaid Other | Attending: Student in an Organized Health Care Education/Training Program | Admitting: Student in an Organized Health Care Education/Training Program

## 2023-02-03 VITALS — BP 108/74 | HR 79 | Temp 97.4°F | Resp 16 | Ht 62.0 in | Wt 225.0 lb

## 2023-02-03 DIAGNOSIS — G894 Chronic pain syndrome: Secondary | ICD-10-CM | POA: Insufficient documentation

## 2023-02-03 DIAGNOSIS — M25562 Pain in left knee: Secondary | ICD-10-CM

## 2023-02-03 DIAGNOSIS — Z96652 Presence of left artificial knee joint: Secondary | ICD-10-CM | POA: Diagnosis not present

## 2023-02-03 DIAGNOSIS — M47816 Spondylosis without myelopathy or radiculopathy, lumbar region: Secondary | ICD-10-CM

## 2023-02-03 DIAGNOSIS — G8929 Other chronic pain: Secondary | ICD-10-CM | POA: Diagnosis not present

## 2023-02-03 DIAGNOSIS — M1711 Unilateral primary osteoarthritis, right knee: Secondary | ICD-10-CM | POA: Diagnosis not present

## 2023-02-03 DIAGNOSIS — M25561 Pain in right knee: Secondary | ICD-10-CM

## 2023-02-03 MED ORDER — DICLOFENAC SODIUM 75 MG PO TBEC: 75.0000 mg | DELAYED_RELEASE_TABLET | Freq: Two times a day (BID) | ORAL | 2 refills | Status: AC

## 2023-02-03 NOTE — Progress Notes (Signed)
Safety precautions to be maintained throughout the outpatient stay will include: orient to surroundings, keep bed in low position, maintain call bell within reach at all times, provide assistance with transfer out of bed and ambulation.  

## 2023-02-03 NOTE — Progress Notes (Signed)
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 12/07/2018   Encounter for gynecological examination with Papanicolaou smear of cervix 12/07/2018   Lichen planus 12/07/2018   Lumbar spondylosis 02/03/2023   Chronic pain of right knee 02/03/2023   Chronic knee pain after total replacement of left knee joint 02/03/2023   Chronic pain syndrome 02/03/2023   Resolved Ambulatory Problems    Diagnosis Date Noted   Primary osteoarthritis of left knee    Past Medical History:  Diagnosis Date   Arthritis    COPD (chronic obstructive pulmonary disease) (HCC)    Hypertension    Hypothyroidism    Sleep apnea    Thyroid disease    Constitutional Exam  General appearance: Well  nourished, well developed, and well hydrated. In no apparent acute distress Vitals:   02/03/23 1403  BP: 108/74  Pulse: 79  Resp: 16  Temp: (!) 97.4 F (36.3 C)  SpO2: 100%  Weight: 225 lb (102.1 kg)  Height: 5\' 2"  (1.575 m)   BMI Assessment: Estimated body mass index is 41.15 kg/m as calculated from the following:   Height as of this encounter: 5\' 2"  (1.575 m).   Weight as of this encounter: 225 lb (102.1 kg).  BMI interpretation table: BMI level Category Range association with higher incidence of chronic pain  <18 kg/m2 Underweight   18.5-24.9 kg/m2 Ideal body weight   25-29.9 kg/m2 Overweight Increased incidence by 20%  30-34.9 kg/m2 Obese (Class I) Increased incidence by 68%  35-39.9 kg/m2 Severe obesity (Class II) Increased incidence by 136%  >40 kg/m2 Extreme obesity (Class III) Increased incidence by 254%   Patient's current BMI Ideal Body weight  Body mass index is 41.15 kg/m. Ideal body weight: 50.1 kg (110 lb 7.2 oz) Adjusted ideal body weight: 70.9 kg (156  lb 4.3 oz)   BMI Readings from Last 4 Encounters:  02/03/23 41.15 kg/m  12/08/22 40.68 kg/m  11/20/22 41.34 kg/m  07/10/22 42.80 kg/m   Wt Readings from Last 4 Encounters:  02/03/23 225 lb (102.1 kg)  12/08/22 226 lb (102.5 kg)  11/20/22 226 lb (102.5 kg)  07/10/22 234 lb (106.1 kg)    Psych/Mental status: Alert, oriented x 3 (person, place, & time)       Eyes: PERLA Respiratory: No evidence of acute respiratory distress  Lumbar Spine Area Exam  Skin & Axial Inspection: No masses, redness, or swelling Alignment: Symmetrical Functional ROM: Pain restricted ROM affecting both sides Stability: No instability detected Muscle Tone/Strength: Functionally intact. No obvious neuro-muscular anomalies detected. Sensory (Neurological): Musculoskeletal pain pattern, facet mediated Palpation: Complains of area being tender to palpation       Provocative Tests: Hyperextension/rotation test: (+) bilaterally  for facet joint pain. Lumbar quadrant test (Kemp's test): (+) bilaterally for facet joint pain.  Gait & Posture Assessment  Ambulation: Unassisted Gait: Relatively normal for age and body habitus Posture: WNL  Lower Extremity Exam    Side: Right lower extremity  Side: Left lower extremity  Stability: No instability observed          Stability: No instability observed          Skin & Extremity Inspection: Skin color, temperature, and hair growth are WNL. No peripheral edema or cyanosis. No masses, redness, swelling, asymmetry, or associated skin lesions. No contractures.  Skin & Extremity Inspection: Evidence of prior arthroplastic surgery  Functional ROM: Pain restricted ROM                  Functional ROM: Mechanically restricted ROM                  Muscle Tone/Strength: Functionally intact. No obvious neuro-muscular anomalies detected.  Muscle Tone/Strength: Functionally intact. No obvious neuro-muscular anomalies detected.  Sensory (Neurological): Unimpaired        Sensory (Neurological): Unimpaired        DTR: Patellar: deferred today Achilles: deferred today Plantar: deferred today  DTR: Patellar: deferred today Achilles: deferred today Plantar: deferred today  Palpation: No palpable anomalies  Palpation: No palpable anomalies    Assessment  Primary Diagnosis & Pertinent Problem List: The primary encounter diagnosis was Lumbar facet arthropathy. Diagnoses of Lumbar spondylosis, Chronic knee pain after total replacement of left knee joint, Chronic pain of right knee, Primary osteoarthritis of right knee, and Chronic pain syndrome were also pertinent to this visit.  Visit Diagnosis (New problems to examiner): 1. Lumbar facet arthropathy   2. Lumbar spondylosis   3. Chronic knee pain after total replacement of left knee joint   4. Chronic pain of right knee   5. Primary osteoarthritis of right knee   6. Chronic pain syndrome    Plan of Care (Initial workup plan)  Jalaiah Dolence has a history of greater than 3 months of moderate to severe pain which is resulted in functional impairment.  The patient has tried various conservative therapeutic options such as NSAIDs, Tylenol, muscle relaxants, physical therapy which was inadequately effective.  Patient's pain is predominantly axial with physical exam and Lumbar MRI findings suggestive of facet arthropathy. Lumbar facet medial branch nerve blocks were discussed with the patient.  Risks and benefits were reviewed.  Patient would like to proceed with bilateral L3, L4, L5 medial branch nerve block.  We also discussed bilateral genicular nerve block and possible  RFA for her persistent right knee pain related to right knee osteoarthritis and left knee pain status post left knee replacement.  Continue with lumbar stretching exercises that she has learned with physical therapy  Refill of Voltaren as below.   Procedure Orders         LUMBAR FACET(MEDIAL BRANCH NERVE BLOCK) MBNB     Pharmacotherapy (current): Medications ordered:  Meds ordered this encounter  Medications   diclofenac (VOLTAREN) 75 MG EC tablet    Sig: Take 1 tablet (75 mg total) by mouth 2 (two) times daily.    Dispense:  60 tablet    Refill:  2   Medications administered during this visit: Hortensia Sanmiguel. Beed had no medications administered during this visit.   Provider-requested follow-up: Return in about 22 days (around 02/25/2023) for B/L L3, 4, 5 MBNB #1, in clinic IV Versed.  Future Appointments  Date Time Provider Department Center  02/27/2023  8:00 AM Drake Leach, PA-C [facility] None    Duration of encounter: .  Total time on encounter, as per AMA guidelines included both the face-to-face and non-face-to-face time personally spent by the physician and/or other qualified health care professional(s) on the day of the encounter (includes time in activities that require the physician or other qualified health care professional and does  not include time in activities normally performed by clinical staff). Physician's time may include the following activities when performed: Preparing to see the patient (e.g., pre-charting review of records, searching for previously ordered imaging, lab work, and nerve conduction tests) Review of prior analgesic pharmacotherapies. Reviewing PMP Interpreting ordered tests (e.g., lab work, imaging, nerve conduction tests) Performing post-procedure evaluations, including interpretation of diagnostic procedures Obtaining and/or reviewing separately obtained history Performing a medically appropriate examination and/or evaluation Counseling and educating the patient/family/caregiver Ordering medications, tests, or procedures Referring and communicating with other health care professionals (when not separately reported) Documenting clinical information in the electronic or other health record Independently interpreting results (not separately reported) and communicating results to the patient/ family/caregiver Care coordination (not separately reported)  Note by: Edward Jolly, MD (TTS technology used. I apologize for any typographical errors that were not detected and corrected.) Date: 02/03/2023; Time: 3:42 PM

## 2023-02-03 NOTE — Patient Instructions (Signed)
______________________________________________________________________  Preparing for your procedure  Appointments: If you think you may not be able to keep your appointment, call 24-48 hours in advance to cancel. We need time to make it available to others.  During your procedure appointment there will be: No Prescription Refills. No disability issues to discussed. No medication changes or discussions.  Instructions: Food intake: Avoid eating anything solid for at least 8 hours prior to your procedure. Clear liquid intake: You may take clear liquids such as water up to 2 hours prior to your procedure. (No carbonated drinks. No soda.) Transportation: Unless otherwise stated by your physician, bring a driver. Morning Medicines: Except for blood thinners, take all of your other morning medications with a sip of water. Make sure to take your heart and blood pressure medicines. If your blood pressure's lower number is above 100, the case will be rescheduled. Blood thinners: Make sure to stop your blood thinners as instructed.  If you take a blood thinner, but were not instructed to stop it, call our office [phone] and ask to talk to a nurse. Not stopping a blood thinner prior to certain procedures could lead to serious complications. Diabetics on insulin: Notify the staff so that you can be scheduled 1st case in the morning. If your diabetes requires high dose insulin, take only  of your normal insulin dose the morning of the procedure and notify the staff that you have done so. Preventing infections: Shower with an antibacterial soap the morning of your procedure.  Build-up your immune system: Take 1000 mg of Vitamin C with every meal (3 times a day) the day prior to your procedure. Antibiotics: Inform the nursing staff if you are taking any antibiotics or if you have any conditions that may require antibiotics prior to procedures. (Example: recent joint implants)   Pregnancy: If you are  pregnant make sure to notify the nursing staff. Not doing so may result in injury to the fetus, including death.  Sickness: If you have a cold, fever, or any active infections, call and cancel or reschedule your procedure. Receiving steroids while having an infection may result in complications. Arrival: You must be in the facility at least 30 minutes prior to your scheduled procedure. Tardiness: Your scheduled time is also the cutoff time. If you do not arrive at least 15 minutes prior to your procedure, you will be rescheduled.  Children: Do not bring any children with you. Make arrangements to keep them home. Dress appropriately: There is always a possibility that your clothing may get soiled. Avoid long dresses. Valuables: Do not bring any jewelry or valuables.  Reasons to call and reschedule or cancel your procedure: (Following these recommendations will minimize the risk of a serious complication.) Surgeries: Avoid having procedures within 2 weeks of any surgery. (Avoid for 2 weeks before or after any surgery). Flu Shots: Avoid having procedures within 2 weeks of a flu shots or . (Avoid for 2 weeks before or after immunizations). Barium: Avoid having a procedure within 7-10 days after having had a radiological study involving the use of radiological contrast. (Myelograms, Barium swallow or enema study). Heart attacks: Avoid any elective procedures or surgeries for the initial 6 months after a "Myocardial Infarction" (Heart Attack). Blood thinners: It is imperative that you stop these medications before procedures. Let us know if you if you take any blood thinner.  Infection: Avoid procedures during or within two weeks of an infection (including chest colds or gastrointestinal problems). Symptoms associated with infections   include: Localized redness, fever, chills, night sweats or profuse sweating, burning sensation when voiding, cough, congestion, stuffiness, runny nose, sore throat, diarrhea,  nausea, vomiting, cold or Flu symptoms, recent or current infections. It is specially important if the infection is over the area that we intend to treat. Heart and lung problems: Symptoms that may suggest an active cardiopulmonary problem include: cough, chest pain, breathing difficulties or shortness of breath, dizziness, ankle swelling, uncontrolled high or unusually low blood pressure, and/or palpitations. If you are experiencing any of these symptoms, cancel your procedure and contact your primary care physician for an evaluation.  Remember:  Regular Business hours are:  Monday to Thursday 8:00 AM to 4:00 PM  Provider's Schedule: Francisco Naveira, MD:  Procedure days: Tuesday and Thursday 7:30 AM to 4:00 PM  Bilal Lateef, MD:  Procedure days: Monday and Wednesday 7:30 AM to 4:00 PM  ______________________________________________________________________  Facet Joint Block The facet joints connect the bones of the spine (vertebrae). They let you bend, twist, and make other movements with your spine. They also keep you from bending too far, twisting too far, and making other extreme movements. A facet joint block is a procedure where a numbing medicine (local anesthesia) is injected into a facet joint. Many times, a medicine for inflammation (steroid) is also injected. A facet joint block may be done: To diagnose neck or back pain. If the pain gets better after a facet joint block, the pain is likely coming from the facet joint. If the pain does not get better, the pain is likely not coming from the facet joint. To treat neck or back pain caused by an inflamed facet joint. To help you with physical therapy or other rehab (rehabilitation) exercises. Tell a health care provider about: Any allergies you have. All medicines you are taking, including vitamins, herbs, eye drops, creams, and over-the-counter medicines. Any problems you or family members have had with anesthesia. Any bleeding  problems you have. Any surgeries you have had. Any medical conditions you have or have had. Whether you are pregnant or may be pregnant. What are the risks? Your health care provider will talk with you about risks. These may include: Infection. Allergic reactions to medicines or dyes. Bleeding. Injury to a nerve near where the needle was put in (injection site). Pain at the injection site. Short-term weakness or numbness in areas near the nerves at the injection site. What happens before the procedure? When to stop eating and drinking Follow instructions from your health care provider about what you may eat and drink. Medicines Ask your health care provider about: Changing or stopping your regular medicines. These include any diabetes medicines or blood thinners you take. Taking medicines such as aspirin and ibuprofen. These medicines can thin your blood. Do not take these medicines unless your health care provider tells you to. Taking over-the-counter medicines, vitamins, herbs, and supplements. General instructions If you will be going home right after the procedure, plan to have a responsible adult: Take you home from the hospital or clinic. You will not be allowed to drive. Care for you for the time you are told. Ask your health care provider: How your injection site will be marked. What steps will be taken to help prevent infection. These may include washing skin with a soap that kills germs. What happens during the procedure?  An IV will be inserted into one of your veins. You will lie on your stomach on an X-ray table. You may be asked to lie   in a different position if you will be getting an injection in your neck. Your injection site will be cleaned with a soap that kills germs and then covered with a germ-free (sterile) drape. A local anesthesia will be put in at the injection site. A type of X-ray machine (fluoroscopy) or CT scan will be used to help find your facet  joint. A contrast dye may also be injected into your joint to help show if the needle is at the joint. When your provider knows the needle is at your joint, they will inject anesthesia and anti-inflammatory medicine as needed. The needle will be removed. Pressure will be applied to keep your injection site from bleeding. A bandage (dressing) will be placed over each injection site. The procedure may vary among health care providers and hospitals. What happens after the procedure? Your blood pressure, heart rate, breathing rate, and blood oxygen level will be monitored until you leave the hospital or clinic. This information is not intended to replace advice given to you by your health care provider. Make sure you discuss any questions you have with your health care provider. Document Revised: 03/21/2022 Document Reviewed: 03/21/2022 [employer] Patient Education  2023 [employer]  

## 2023-02-25 ENCOUNTER — Ambulatory Visit
Payer: Medicaid Other | Attending: Student in an Organized Health Care Education/Training Program | Admitting: Student in an Organized Health Care Education/Training Program

## 2023-02-25 ENCOUNTER — Encounter: Payer: Self-pay | Admitting: Student in an Organized Health Care Education/Training Program

## 2023-02-25 ENCOUNTER — Ambulatory Visit
Admission: RE | Admit: 2023-02-25 | Discharge: 2023-02-25 | Disposition: A | Discharge status: Home / Self Care | Payer: Medicaid Other | Source: Ambulatory Visit | Attending: Student in an Organized Health Care Education/Training Program | Admitting: Student in an Organized Health Care Education/Training Program

## 2023-02-25 DIAGNOSIS — M47816 Spondylosis without myelopathy or radiculopathy, lumbar region: Secondary | ICD-10-CM

## 2023-02-25 DIAGNOSIS — G894 Chronic pain syndrome: Secondary | ICD-10-CM | POA: Diagnosis not present

## 2023-02-25 MED ORDER — LIDOCAINE HCL 2 % IJ SOLN
INTRAMUSCULAR | Status: AC
  Filled 2023-02-25: qty 20

## 2023-02-25 MED ORDER — DEXAMETHASONE SODIUM PHOSPHATE 10 MG/ML IJ SOLN
INTRAMUSCULAR | Status: AC
  Filled 2023-02-25: qty 2

## 2023-02-25 MED ORDER — DEXAMETHASONE SODIUM PHOSPHATE 10 MG/ML IJ SOLN
INTRAMUSCULAR | Status: AC
  Filled 2023-02-25: qty 1

## 2023-02-25 MED ORDER — LIDOCAINE HCL 2 % IJ SOLN
20.0000 mL | Freq: Once | INTRAMUSCULAR | Status: AC
  Administered 2023-02-25: 400 mg

## 2023-02-25 MED ORDER — DEXAMETHASONE SODIUM PHOSPHATE 10 MG/ML IJ SOLN
10.0000 mg | Freq: Once | INTRAMUSCULAR | Status: AC
  Administered 2023-02-25: 20 mg

## 2023-02-25 MED ORDER — IOHEXOL 180 MG/ML  SOLN: 10.0000 mL | Freq: Once | INTRAMUSCULAR | Status: DC

## 2023-02-25 MED ORDER — MIDAZOLAM HCL 2 MG/2ML IJ SOLN
INTRAMUSCULAR | Status: AC
  Filled 2023-02-25: qty 2

## 2023-02-25 MED ORDER — ROPIVACAINE HCL 2 MG/ML IJ SOLN
INTRAMUSCULAR | Status: AC
  Filled 2023-02-25: qty 20

## 2023-02-25 MED ORDER — SODIUM CHLORIDE 0.9% FLUSH: 2.0000 mL | Freq: Once | INTRAVENOUS | Status: DC

## 2023-02-25 MED ORDER — MIDAZOLAM HCL 2 MG/2ML IJ SOLN
0.5000 mg | Freq: Once | INTRAMUSCULAR | Status: AC
  Administered 2023-02-25: 2 mg via INTRAVENOUS

## 2023-02-25 MED ORDER — LACTATED RINGERS IV SOLN: Freq: Once | INTRAVENOUS | Status: AC

## 2023-02-25 MED ORDER — ROPIVACAINE HCL 2 MG/ML IJ SOLN
2.0000 mL | Freq: Once | INTRAMUSCULAR | Status: AC
  Administered 2023-02-25: 2 mL via EPIDURAL

## 2023-02-25 NOTE — Progress Notes (Signed)
Safety precautions to be maintained throughout the outpatient stay will include: orient to surroundings, keep bed in low position, maintain call bell within reach at all times, provide assistance with transfer out of bed and ambulation.  

## 2023-02-25 NOTE — Patient Instructions (Addendum)

## 2023-02-25 NOTE — Progress Notes (Signed)
PROVIDER NOTE: Interpretation of information contained herein should be left to medically-trained personnel. Specific patient instructions are provided elsewhere under "Patient Instructions" section of medical record. This document was created in part using STT-dictation technology, any transcriptional errors that may result from this process are unintentional.  Patient: [name] Type: Established DOB: Feb 14, 1966 MRN: [id number] PCP: Assunta Found, MD  Service: Procedure DOS: 02/25/2023 Setting: Ambulatory Location: Ambulatory outpatient facility Delivery: Face-to-face Provider: Edward Jolly, MD Specialty: Interventional Pain Management Specialty designation: 09 Location: Outpatient facility Ref. Prov.: Edward Jolly, MD       Interventional Therapy   Procedure: Lumbar Facet, Medial Branch Block(s) #1  Laterality: Bilateral  Level: L3, L4, and L5 Medial Branch Level(s). Injecting these levels blocks the L3-4 and L4-5 lumbar facet joints. Imaging: Fluoroscopic guidance  Anesthesia: Local anesthesia (1-2% Lidocaine) Anxiolysis: IV Versed         DOS: 02/25/2023 Performed by: Edward Jolly, MD  Primary Purpose: Diagnostic/Therapeutic Indications: Low back pain severe enough to impact quality of life or function. 1. Lumbar facet arthropathy   2. Lumbar spondylosis   3. Chronic pain syndrome    NAS-11 Pain score:   Pre-procedure: 6 /10   Post-procedure: 5 /10     Position / Prep / Materials:  Position: Prone  Prep solution: DuraPrep (Iodine Povacrylex [0.7% available iodine] and Isopropyl Alcohol, 74% w/w) Area Prepped: Posterolateral Lumbosacral Spine (Wide prep: From the lower border of the scapula down to the end of the tailbone and from flank to flank.)  Materials:  Tray: Block Needle(s):  Type: Spinal  Gauge (G): 22  Length: 5-in Qty: 2      Pre-op H&P Assessment:  [name] is a 57 y.o. (year old), female patient, seen today for interventional treatment. She   has a past surgical history that includes Nasal sinus surgery; Knee arthroscopy with medial menisectomy (Left, 05/04/2015); Colonoscopy (N/A, 11/12/2015); Total knee arthroplasty (Left, 01/29/2017); Joint replacement; Rotator cuff repair; Shoulder surgery (Right, 10/12/2018); Shoulder arthroscopy (Right, 10/12/2018); and Breast reduction surgery (Bilateral, 04/17/2020). [name] has a current medication list which includes the following prescription(s): acetaminophen, albuterol, budesonide-formoterol, bupropion, diclofenac, escitalopram, hydrochlorothiazide, levothyroxine, magnesium, and omega-3 fatty acids, and the following Facility-Administered Medications: iohexol, lactated ringers, and sodium chloride flush. Her primarily concern today is the Back Pain  Initial Vital Signs:  Pulse/HCG Rate: 71ECG Heart Rate: 76 Temp:   Resp: 18 BP: 127/67 SpO2: 100 %  BMI: Estimated body mass index is 39.15 kg/m as calculated from the following:   Height as of this encounter: 5\' 3"  (1.6 m).   Weight as of this encounter: 221 lb (100.2 kg).  Risk Assessment: Allergies: Reviewed. She is allergic to penicillins.  Allergy Precautions: None required Coagulopathies: Reviewed. None identified.  Blood-thinner therapy: None at this time Active Infection(s): Reviewed. None identified. [name] is afebrile  Site Confirmation: [name] was asked to confirm the procedure and laterality before marking the site Procedure checklist: Completed Consent: Before the procedure and under the influence of no sedative(s), amnesic(s), or anxiolytics, the patient was informed of the treatment options, risks and possible complications. To fulfill our ethical and legal obligations, as recommended by the American Medical Association's Code of Ethics, I have informed the patient of my clinical impression; the nature and purpose of the treatment or procedure; the risks, benefits, and possible complications of the intervention; the  alternatives, including doing nothing; the risk(s) and benefit(s) of the alternative treatment(s) or procedure(s); and the risk(s) and benefit(s) of doing nothing.  The patient was provided information about the general risks and possible complications associated with the procedure. These may include, but are not limited to: failure to achieve desired goals, infection, bleeding, organ or nerve damage, allergic reactions, paralysis, and death. In addition, the patient was informed of those risks and complications associated to Spine-related procedures, such as failure to decrease pain; infection (i.e.: Meningitis, epidural or intraspinal abscess); bleeding (i.e.: epidural hematoma, subarachnoid hemorrhage, or any other type of intraspinal or peri-dural bleeding); organ or nerve damage (i.e.: Any type of peripheral nerve, nerve root, or spinal cord injury) with subsequent damage to sensory, motor, and/or autonomic systems, resulting in permanent pain, numbness, and/or weakness of one or several areas of the body; allergic reactions; (i.e.: anaphylactic reaction); and/or death. Furthermore, the patient was informed of those risks and complications associated with the medications. These include, but are not limited to: allergic reactions (i.e.: anaphylactic or anaphylactoid reaction(s)); adrenal axis suppression; blood sugar elevation that in diabetics may result in ketoacidosis or comma; water retention that in patients with history of congestive heart failure may result in shortness of breath, pulmonary edema, and decompensation with resultant heart failure; weight gain; swelling or edema; medication-induced neural toxicity; particulate matter embolism and blood vessel occlusion with resultant organ, and/or nervous system infarction; and/or aseptic necrosis of one or more joints. Finally, the patient was informed that Medicine is not an exact science; therefore, there is also the possibility of unforeseen or  unpredictable risks and/or possible complications that may result in a catastrophic outcome. The patient indicated having understood very clearly. We have given the patient no guarantees and we have made no promises. Enough time was given to the patient to ask questions, all of which were answered to the patient's satisfaction. Ms. Schild has indicated that she wanted to continue with the procedure. Attestation: I, the ordering provider, attest that I have discussed with the patient the benefits, risks, side-effects, alternatives, likelihood of achieving goals, and potential problems during recovery for the procedure that I have provided informed consent. Date  Time: 02/25/2023  8:23 AM   Pre-Procedure Preparation:  Monitoring: As per clinic protocol. Respiration, ETCO2, SpO2, BP, heart rate and rhythm monitor placed and checked for adequate function Safety Precautions: Patient was assessed for positional comfort and pressure points before starting the procedure. Time-out: I initiated and conducted the "Time-out" before starting the procedure, as per protocol. The patient was asked to participate by confirming the accuracy of the "Time Out" information. Verification of the correct person, site, and procedure were performed and confirmed by me, the nursing staff, and the patient. "Time-out" conducted as per Joint Commission's Universal Protocol (UP.01.01.01). Time: 0903 Start Time: 0903 hrs.  Description of Procedure:          Laterality: (see above) Targeted Levels: (see above)  Safety Precautions: Aspiration looking for blood return was conducted prior to all injections. At no point did we inject any substances, as a needle was being advanced. Before injecting, the patient was told to immediately notify me if she was experiencing any new onset of "ringing in the ears, or metallic taste in the mouth". No attempts were made at seeking any paresthesias. Safe injection practices and needle disposal  techniques used. Medications properly checked for expiration dates. SDV (single dose vial) medications used. After the completion of the procedure, all disposable equipment used was discarded in the proper designated medical waste containers. Local Anesthesia: Protocol guidelines were followed. The patient was positioned over the fluoroscopy table. The area  was prepped in the usual manner. The time-out was completed. The target area was identified using fluoroscopy. A 12-in long, straight, sterile hemostat was used with fluoroscopic guidance to locate the targets for each level blocked. Once located, the skin was marked with an approved surgical skin marker. Once all sites were marked, the skin (epidermis, dermis, and hypodermis), as well as deeper tissues (fat, connective tissue and muscle) were infiltrated with a small amount of a short-acting local anesthetic, loaded on a 10cc syringe with a 25G, 1.5-in  Needle. An appropriate amount of time was allowed for local anesthetics to take effect before proceeding to the next step. Local Anesthetic: Lidocaine 2.0% The unused portion of the local anesthetic was discarded in the proper designated containers. Technical description of process:  L3 Medial Branch Nerve Block (MBB): The target area for the L3 medial branch is at the junction of the postero-lateral aspect of the superior articular process and the superior, posterior, and medial edge of the transverse process of L4. Under fluoroscopic guidance, a Quincke needle was inserted until contact was made with os over the superior postero-lateral aspect of the pedicular shadow (target area). After negative aspiration for blood, 2 mL of the nerve block solution was injected without difficulty or complication. The needle was removed intact. L4 Medial Branch Nerve Block (MBB): The target area for the L4 medial branch is at the junction of the postero-lateral aspect of the superior articular process and the superior,  posterior, and medial edge of the transverse process of L5. Under fluoroscopic guidance, a Quincke needle was inserted until contact was made with os over the superior postero-lateral aspect of the pedicular shadow (target area). After negative aspiration for blood, 2mL of the nerve block solution was injected without difficulty or complication. The needle was removed intact. L5 Medial Branch Nerve Block (MBB): The target area for the L5 medial branch is at the junction of the postero-lateral aspect of the superior articular process and the superior, posterior, and medial edge of the sacral ala. Under fluoroscopic guidance, a Quincke needle was inserted until contact was made with os over the superior postero-lateral aspect of the pedicular shadow (target area). After negative aspiration for blood, 2mL of the nerve block solution was injected without difficulty or complication. The needle was removed intact.   Once the entire procedure was completed, the treated area was cleaned, making sure to leave some of the prepping solution back to take advantage of its long term bactericidal properties.         Illustration of the posterior view of the lumbar spine and the posterior neural structures. Laminae of L2 through S1 are labeled. DPRL5, dorsal primary ramus of L5; DPRS1, dorsal primary ramus of S1; DPR3, dorsal primary ramus of L3; FJ, facet (zygapophyseal) joint L3-L4; I, inferior articular process of L4; LB1, lateral branch of dorsal primary ramus of L1; IAB, inferior articular branches from L3 medial branch (supplies L4-L5 facet joint); IBP, intermediate branch plexus; MB3, medial branch of dorsal primary ramus of L3; NR3, third lumbar nerve root; S, superior articular process of L5; SAB, superior articular branches from L4 (supplies L4-5 facet joint also); TP3, transverse process of L3.   Facet Joint Innervation (* possible contribution)  L1-2 T12, L1 (L2*)  Medial Branch  L2-3 L1, L2 (L3*)          "          "  L3-4 L2, L3 (L4*)         "          "  L4-5 L3, L4 (L5*)         "          "  L5-S1 L4, L5, S1          "          "    Vitals:   02/25/23 0900 02/25/23 0905 02/25/23 0910 02/25/23 0917  BP: 122/88 122/82 123/81 100/77  Pulse:      Resp: 18 17 15 15   SpO2: 98% 100% 97% 100%  Weight:      Height:         End Time: 0910 hrs.  Imaging Guidance (Spinal):          Type of Imaging Technique: Fluoroscopy Guidance (Spinal) Indication(s): Assistance in needle guidance and placement for procedures requiring needle placement in or near specific anatomical locations not easily accessible without such assistance. Exposure Time: Please see nurses notes. Contrast: None used. Fluoroscopic Guidance: I was personally present during the use of fluoroscopy. "Tunnel Vision Technique" used to obtain the best possible view of the target area. Parallax error corrected before commencing the procedure. "Direction-depth-direction" technique used to introduce the needle under continuous pulsed fluoroscopy. Once target was reached, antero-posterior, oblique, and lateral fluoroscopic projection used confirm needle placement in all planes. Images permanently stored in EMR. Interpretation: No contrast injected. I personally interpreted the imaging intraoperatively. Adequate needle placement confirmed in multiple planes. Permanent images saved into the patient's record.  Post-operative Assessment:  Post-procedure Vital Signs:  Pulse/HCG Rate: 7163 Temp:   Resp: 15 BP: 100/77 SpO2: 100 %  EBL: None  Complications: No immediate post-treatment complications observed by team, or reported by patient.  Note: The patient tolerated the entire procedure well. A repeat set of vitals were taken after the procedure and the patient was kept under observation following institutional policy, for this type of procedure. Post-procedural neurological assessment was performed, showing return to baseline, prior to  discharge. The patient was provided with post-procedure discharge instructions, including a section on how to identify potential problems. Should any problems arise concerning this procedure, the patient was given instructions to immediately contact us, at any time, without hesitation. In any case, we plan to contact the patient by telephone for a follow-up status report regarding this interventional procedure.  Comments:  No additional relevant information.  Plan of Care (POC)  Orders:  Orders Placed This Encounter  Procedures   DG PAIN CLIN[facility] C-ARM 1-60 MIN NO REPORT    Intraoperative interpretation by procedural physician at [facility].    Standing Status:   Standing    Number of Occurrences:   1    Order Specific Question:   Reason for exam:    Answer:   Assistance in needle guidance and placement for procedures requiring needle placement in or near specific anatomical locations not easily accessible without such assistance.     Medications ordered for procedure: Meds ordered this encounter  Medications   iohexol (OMNIPAQUE) 180 MG/ML injection 10 mL    Must be Myelogram-compatible. If not available, you may substitute with a water-soluble, non-ionic, hypoallergenic, myelogram-compatible radiological contrast medium.   lidocaine (XYLOCAINE) 2 % (with pres) injection 400 mg   lactated ringers infusion   midazolam (VERSED) injection 0.5-2 mg    Make sure Flumazenil is available in the pyxis when using this medication. If oversedation occurs, administer 0.2 mg IV over 15 sec. If after 45 sec no response, administer 0.2 mg again over 1 min; may repeat at 1 min  intervals; not to exceed 4 doses (1 mg)   sodium chloride flush (NS) 0.9 % injection 2 mL   ropivacaine (PF) 2 mg/mL (0.2%) (NAROPIN) injection 2 mL   dexamethasone (DECADRON) injection 10 mg   Medications administered: We administered lidocaine, lactated ringers, midazolam, ropivacaine (PF) 2 mg/mL (0.2%), and  dexamethasone.  See the medical record for exact dosing, route, and time of administration.  Follow-up plan:   Return in about 4 weeks (around 03/25/2023) for Post Procedure Evaluation, in person.       B/L L3,4,5 MBNB #1 02/25/23    Recent Visits Date Type Provider Dept  02/03/23 Office Visit Edward Jolly, MD [facility]  Showing recent visits within past 90 days and meeting all other requirements Today's Visits Date Type Provider Dept  02/25/23 Procedure visit Edward Jolly, MD [facility]  Showing today's visits and meeting all other requirements Future Appointments Date Type Provider Dept  03/25/23 Appointment Edward Jolly, MD [facility]  Showing future appointments within next 90 days and meeting all other requirements  Disposition: Discharge home  Discharge (Date  Time): 02/25/2023; 0925 hrs.   Primary Care Physician: Assunta Found, MD Location: [facility] Note by: Edward Jolly, MD (TTS technology used. I apologize for any typographical errors that were not detected and corrected.) Date: 02/25/2023; Time: 9:27 AM  Disclaimer:  Medicine is not an Visual merchandiser. The only guarantee in medicine is that nothing is guaranteed. It is important to note that the decision to proceed with this intervention was based on the information collected from the patient. The Data and conclusions were drawn from the patient's questionnaire, the interview, and the physical examination. Because the information was provided in large part by the patient, it cannot be guaranteed that it has not been purposely or unconsciously manipulated. Every effort has been made to obtain as much relevant data as possible for this evaluation. It is important to note that the conclusions that lead to this procedure are derived in large part from the available data. Always take into account that the treatment will also be dependent on availability of resources and  existing treatment guidelines, considered by other Pain Management Practitioners as being common knowledge and practice, at the time of the intervention. For Medico-Legal purposes, it is also important to point out that variation in procedural techniques and pharmacological choices are the acceptable norm. The indications, contraindications, technique, and results of the above procedure should only be interpreted and judged by a Board-Certified Interventional Pain Specialist with extensive familiarity and expertise in the same exact procedure and technique.

## 2023-02-26 ENCOUNTER — Telehealth: Payer: Self-pay | Admitting: *Deleted

## 2023-02-26 NOTE — Telephone Encounter (Signed)
Attempted to call patient for post procedure follow-up. Message left. 

## 2023-02-26 NOTE — Progress Notes (Signed)
   Telephone Visit- Progress Note: Referring Physician:  No referring provider defined for this encounter.  Primary Physician:  Assunta Found, MD  This visit was performed via telephone.  Patient location: home Provider location: office  I spent a total of 10 minutes non-face-to-face activities for this visit on the date of this encounter including review of current clinical condition and response to treatment.    Patient has given verbal consent to this telephone visits and we reviewed the limitations of a telephone visit. Patient wishes to proceed.    Chief Complaint:  recheck after injection  History of Present Illness: [name] is a 57 y.o. female has a history of  hypothyroidism, dyslipidemia.    She has been seeing Dr. Romeo Apple for right sided LBP into right leg. She needs a right TKA. She's had left TKA done in 2018.   Last seen by me on 12/08/22 for back and right leg pain. She has known slip at L4-L5 and L5-S1 with multilevel lumbar spondylosis. No significant central stenosis. Mild left foraminal stenosis L3-L4.   She was sent to pain management and saw Dr. Cherylann Ratel. She had Bilateral MBB L3, L4, L5 on 02/25/23.   She has not seen much relief with above MBB yet. She continues with intermittent right sided back pain. She had some left sided back pain yesterday as well. Right leg pain lateral to her knee has improved. Pain ranges from a 3 to an 8 on a scale of 1-10. She continues on voltaren. Needs a refill- pharmacy is working on this as her insurance will not approve it. She usually pays out of pocket.    No improvement with previous PT.    Conservative measures:  Physical therapy: initial eval 08/12/22 and did 7 visits. Was discharged on 10/01/22 ([facility]).  Multimodal medical therapy including regular antiinflammatories:  Tylenol, voltaren Injections:  Bilateral MBB L3, L4, L5 on 02/25/23 with Dr. Cherylann Ratel  Past Surgery: No spinal surgery.   Exam: No exam done as  this was a telephone encounter.     Imaging: none   Assessment and Plan: [name] is a pleasant 57 y.o. female has not seen much relief with above MBB yet. She continues with intermittent right sided back pain. She had some left sided back pain yesterday as well. Right leg pain lateral to her knee has improved.   She has known slip at L4-L5 and L5-S1 with multilevel lumbar spondylosis. No significant central stenosis. Mild left foraminal stenosis L3-L4.    LBP likely multifactorial and from lumbar spondylosis/facet hypertrophy. Not sure of cause of right leg pain.    Treatment options discussed with patient and following plan made:    - Hopefully she will see improvement with MBB over next 7-10 days.  - Continue on voltaren from other providers.  - Follow up with Dr. Cherylann Ratel as scheduled.  - She will f/u prn with me. She would like to avoid any surgery if possible.   Drake Leach PA-C Neurosurgery

## 2023-02-27 ENCOUNTER — Encounter: Payer: Self-pay | Admitting: Orthopedic Surgery

## 2023-02-27 ENCOUNTER — Ambulatory Visit (INDEPENDENT_AMBULATORY_CARE_PROVIDER_SITE_OTHER): Payer: Medicaid Other | Admitting: Orthopedic Surgery

## 2023-02-27 DIAGNOSIS — M5416 Radiculopathy, lumbar region: Secondary | ICD-10-CM

## 2023-02-27 DIAGNOSIS — M4316 Spondylolisthesis, lumbar region: Secondary | ICD-10-CM

## 2023-02-27 DIAGNOSIS — M4726 Other spondylosis with radiculopathy, lumbar region: Secondary | ICD-10-CM | POA: Diagnosis not present

## 2023-02-27 DIAGNOSIS — M47816 Spondylosis without myelopathy or radiculopathy, lumbar region: Secondary | ICD-10-CM

## 2023-03-01 ENCOUNTER — Ambulatory Visit
Admission: EM | Admit: 2023-03-01 | Discharge: 2023-03-01 | Discharge status: Against Medical Advice | Payer: Medicaid Other

## 2023-03-25 ENCOUNTER — Ambulatory Visit: Payer: Medicaid Other | Admitting: Student in an Organized Health Care Education/Training Program

## 2023-04-23 ENCOUNTER — Ambulatory Visit
Payer: 59 | Attending: Student in an Organized Health Care Education/Training Program | Admitting: Student in an Organized Health Care Education/Training Program

## 2023-04-23 ENCOUNTER — Encounter: Payer: Self-pay | Admitting: Student in an Organized Health Care Education/Training Program

## 2023-04-23 VITALS — BP 115/61 | HR 80 | Temp 97.5°F | Resp 16 | Ht 63.0 in | Wt 219.0 lb

## 2023-04-23 DIAGNOSIS — G894 Chronic pain syndrome: Secondary | ICD-10-CM | POA: Diagnosis not present

## 2023-04-23 DIAGNOSIS — M47816 Spondylosis without myelopathy or radiculopathy, lumbar region: Secondary | ICD-10-CM | POA: Diagnosis not present

## 2023-04-23 NOTE — Progress Notes (Signed)
Safety precautions to be maintained throughout the outpatient stay will include: orient to surroundings, keep bed in low position, maintain call bell within reach at all times, provide assistance with transfer out of bed and ambulation.  

## 2023-04-23 NOTE — Progress Notes (Signed)
PROVIDER NOTE: Information contained herein reflects review and annotations entered in association with encounter. Interpretation of such information and data should be left to medically-trained personnel. Information provided to patient can be located elsewhere in the medical record under "Patient Instructions". Document created using STT-dictation technology, any transcriptional errors that may result from process are unintentional.    Patient: [name]  Service Category: E/M  Provider: Edward Jolly, MD  DOB: 03/21/1966  DOS: 04/23/2023  Referring Provider: Assunta Found, MD  MRN: [id number]  Specialty: Interventional Pain Management  PCP: Assunta Found, MD  Type: Established Patient  Setting: Ambulatory outpatient    Location: Office  Delivery: Face-to-face     HPI  Ms. [name], a 57 y.o. year old female, is here today because of her Lumbar facet arthropathy [M47.816]. Ms. [name] primary complain today is Back Pain (lower)  Pertinent problems: Ms. [name] does not have any pertinent problems on file. Pain Assessment: Severity of Chronic pain is reported as a 5 /10. Location: Back Lower, Right, Left/right hip down to above the knee. Onset: More than a month ago. Quality: Aching, Tightness, Restless (Pressure). Timing: Constant. Modifying factor(s): rest, procedure. Vitals:  height is 5\' 3"  (1.6 m) and weight is 219 lb (99.3 kg). Her temperature is 97.5 F (36.4 C) (abnormal). Her blood pressure is 115/61 and her pulse is 80. Her respiration is 16 and oxygen saturation is 99%.  BMI: Estimated body mass index is 38.79 kg/m as calculated from the following:   Height as of this encounter: 5\' 3"  (1.6 m).   Weight as of this encounter: 219 lb (99.3 kg). Last encounter: 02/03/2023. Last procedure: 02/25/2023.  Reason for encounter:    Post-procedure evaluation   Procedure: Lumbar Facet, Medial Branch Block(s) #1  Laterality: Bilateral  Level: L3, L4, and L5 Medial Branch  Level(s). Injecting these levels blocks the L3-4 and L4-5 lumbar facet joints. Imaging: Fluoroscopic guidance  Anesthesia: Local anesthesia (1-2% Lidocaine) Anxiolysis: IV Versed         DOS: 02/25/2023 Performed by: Edward Jolly, MD  Primary Purpose: Diagnostic/Therapeutic Indications: Low back pain severe enough to impact quality of life or function. 1. Lumbar facet arthropathy   2. Lumbar spondylosis   3. Chronic pain syndrome    NAS-11 Pain score:   Pre-procedure: 6 /10   Post-procedure: 5 /10      Effectiveness:  Initial hour after procedure: 100 %  Subsequent 4-6 hours post-procedure: 100 %  Analgesia past initial 6 hours: 75% for first 2 weeks then50 % (2 weeks)  Ongoing improvement:  Analgesic: Back to baseline Function: Back to baseline ROM: Back to baseline    ROS  Constitutional: Denies any fever or chills Gastrointestinal: No reported hemesis, hematochezia, vomiting, or acute GI distress Musculoskeletal:  +LBP Neurological: No reported episodes of acute onset apraxia, aphasia, dysarthria, agnosia, amnesia, paralysis, loss of coordination, or loss of consciousness  Medication Review  Acetaminophen, Magnesium, Omega-3 Fatty Acids, albuterol, buPROPion, budesonide-formoterol, diclofenac, escitalopram, hydrochlorothiazide, and levothyroxine  History Review  Allergy: Ms. [name] is allergic to penicillins. Drug: Ms. [name]  reports no history of drug use. Alcohol:  reports current alcohol use. Tobacco:  reports that she has never smoked. She has never used smokeless tobacco. Social: Ms. [name]  reports that she has never smoked. She has never used smokeless tobacco. She reports current alcohol use. She reports that she does not use drugs. Medical:  has a past medical history of Arthritis, Back pain (10/30/2015), Bilateral shoulder pain (10/30/2015),  COPD (chronic obstructive pulmonary disease) (HCC), Fecal occult blood test positive (10/30/2015), Hemorrhoids (10/30/2015),  Hypertension, Hypothyroidism, Large breasts (10/30/2015), Postmenopausal (10/30/2015), Sleep apnea, and Thyroid disease. Surgical: Ms. [name]  has a past surgical history that includes Nasal sinus surgery; Knee arthroscopy with medial menisectomy (Left, 05/04/2015); Colonoscopy (N/A, 11/12/2015); Total knee arthroplasty (Left, 01/29/2017); Joint replacement; Rotator cuff repair; Shoulder surgery (Right, 10/12/2018); Shoulder arthroscopy (Right, 10/12/2018); and Breast reduction surgery (Bilateral, 04/17/2020). Family: family history includes Alzheimer's disease in her mother; Breast cancer in her cousin and maternal aunt; Cancer in her brother and father; Diabetes in her brother; Heart attack in her brother; Heart disease in her brother, maternal grandfather, and maternal grandmother; Hypertension in her maternal grandfather; Lupus in her maternal grandmother.  Laboratory Chemistry Profile   Renal Lab Results  Component Value Date   BUN 23 (H) 05/13/2022   CREATININE 0.82 05/13/2022   BCR NOT APPLICABLE 06/15/2018   GFRAA >60 04/16/2020   GFRNONAA >60 05/13/2022    Hepatic Lab Results  Component Value Date   AST 25 05/13/2022   ALT 23 05/13/2022   ALBUMIN 3.3 (L) 05/13/2022   ALKPHOS 83 05/13/2022   AMYLASE 32 06/06/2008   LIPASE 28 05/13/2022    Electrolytes Lab Results  Component Value Date   NA 139 05/13/2022   K 3.7 05/13/2022   CL 104 05/13/2022   CALCIUM 8.1 (L) 05/13/2022    Bone Lab Results  Component Value Date   VD25OH 32 06/15/2018    Inflammation (CRP: Acute Phase) (ESR: Chronic Phase) Lab Results  Component Value Date   ESRSEDRATE 17 06/15/2018         Note: Above Lab results reviewed.  MR Lumbar Spine Wo Contrast   Narrative CLINICAL DATA:  Lumbar spinal stenosis. Low back pain radiating to the right hip and knee.   EXAM: MRI LUMBAR SPINE WITHOUT CONTRAST   TECHNIQUE: Multiplanar, multisequence MR imaging of the lumbar spine was performed. No intravenous  contrast was administered.   COMPARISON:  CT abdomen and pelvis 05/13/2022   FINDINGS: Segmentation:  Standard.   Alignment: Facet mediated anterolisthesis of L4 on L5 and L5 on S1 measuring 2 and 3 mm, respectively.   Vertebrae: No fracture or suspicious marrow lesion. Mild right facet edema at L4-5.   Conus medullaris and cauda equina: Conus extends to the T12 level. Conus and cauda equina appear normal.   Paraspinal and other soft tissues: Unremarkable.   Disc levels:   Disc desiccation and up to mild disc space narrowing throughout the lumbar spine.   T12-L1: Only imaged sagittally and negative.   L1-2: Mild disc bulging and mild facet hypertrophy without stenosis.   L2-3: Disc bulging and mild-to-moderate facet hypertrophy without stenosis.   L3-4: Disc bulging, endplate spurring, and mild-to-moderate facet hypertrophy result in mild left neural foraminal stenosis without spinal stenosis.   L4-5: Anterolisthesis with bulging uncovered disc and moderate to severe facet hypertrophy without stenosis.   L5-S1: Anterolisthesis with disc uncovering and severe facet hypertrophy without stenosis.   IMPRESSION: 1. Mild diffuse disc degeneration and advanced lower lumbar facet arthrosis without spinal stenosis. 2. Mild left neural foraminal stenosis at L3-4.     Electronically Signed By: Sebastian Ache M.D. On: 11/15/2022 11:15 Note: Reviewed        Physical Exam  General appearance: Well nourished, well developed, and well hydrated. In no apparent acute distress Mental status: Alert, oriented x 3 (person, place, & time)       Respiratory: No  evidence of acute respiratory distress Eyes: PERLA Vitals: BP 115/61   Pulse 80   Temp (!) 97.5 F (36.4 C)   Resp 16   Ht 5\' 3"  (1.6 m)   Wt 219 lb (99.3 kg)   LMP 04/14/2011   SpO2 99%   BMI 38.79 kg/m  BMI: Estimated body mass index is 38.79 kg/m as calculated from the following:   Height as of this encounter: 5'  3" (1.6 m).   Weight as of this encounter: 219 lb (99.3 kg). Ideal: Ideal body weight: 52.4 kg (115 lb 8.3 oz) Adjusted ideal body weight: 71.2 kg (156 lb 14.6 oz)  Lumbar Spine Area Exam  Skin & Axial Inspection: No masses, redness, or swelling Alignment: Symmetrical Functional ROM: Pain restricted ROM affecting both sides Stability: No instability detected Muscle Tone/Strength: Functionally intact. No obvious neuro-muscular anomalies detected. Sensory (Neurological): Musculoskeletal pain pattern, facet mediated Palpation: Complains of area being tender to palpation       Provocative Tests: Hyperextension/rotation test: (+) bilaterally for facet joint pain. Lumbar quadrant test (Kemp's test): (+) bilaterally for facet joint pain.   Gait & Posture Assessment  Ambulation: Unassisted Gait: Relatively normal for age and body habitus Posture: WNL  Lower Extremity Exam      Side: Right lower extremity   Side: Left lower extremity  Stability: No instability observed           Stability: No instability observed          Skin & Extremity Inspection: Skin color, temperature, and hair growth are WNL. No peripheral edema or cyanosis. No masses, redness, swelling, asymmetry, or associated skin lesions. No contractures.   Skin & Extremity Inspection: Evidence of prior arthroplastic surgery  Functional ROM: Pain restricted ROM                   Functional ROM: Mechanically restricted ROM                  Muscle Tone/Strength: Functionally intact. No obvious neuro-muscular anomalies detected.   Muscle Tone/Strength: Functionally intact. No obvious neuro-muscular anomalies detected.  Sensory (Neurological): Unimpaired         Sensory (Neurological): Unimpaired        DTR: Patellar: deferred today Achilles: deferred today Plantar: deferred today   DTR: Patellar: deferred today Achilles: deferred today Plantar: deferred today  Palpation: No palpable anomalies   Palpation: No palpable anomalies      Assessment   Diagnosis Status  1. Lumbar facet arthropathy   2. Lumbar spondylosis   3. Chronic pain syndrome    Responding Responding Controlled    Plan of Care  Positive response to first set of diagnostic lumbar facet medial branch nerve block.  Discussed repeating diagnostic nerve block #2 and then considering lumbar RFA  Orders:  Orders Placed This Encounter  Procedures   LUMBAR FACET(MEDIAL BRANCH NERVE BLOCK) MBNB    Standing Status:   Future    Standing Expiration Date:   07/24/2023    Scheduling Instructions:     Procedure: Lumbar facet block (AKA.: Lumbosacral medial branch nerve block)     Side: Bilateral     Level: L3-4, L4-5, Facets (L3, L4, L5, Medial Branch)     Sedation: IV Versed      Timeframe: ASAA    Order Specific Question:   Where will this procedure be performed?    Answer:   [facility]   Follow-up plan:   Return in about 11 days (  around 05/04/2023) for B/L L3, 4, 5 MBNB#2, in clinic IV Versed.      B/L L3,4,5 MBNB #1 02/25/23     Recent Visits Date Type Provider Dept  02/25/23 Procedure visit Edward Jolly, MD [facility]  02/03/23 Office Visit Edward Jolly, MD [facility]  Showing recent visits within past 90 days and meeting all other requirements Today's Visits Date Type Provider Dept  04/23/23 Office Visit Edward Jolly, MD [facility]  Showing today's visits and meeting all other requirements Future Appointments No visits were found meeting these conditions. Showing future appointments within next 90 days and meeting all other requirements  I discussed the assessment and treatment plan with the patient. The patient was provided an opportunity to ask questions and all were answered. The patient agreed with the plan and demonstrated an understanding of the instructions.  Patient advised to call back or seek an in-person evaluation if the symptoms or condition worsens.  Duration of encounter:  .  Total time on encounter, as per AMA guidelines included both the face-to-face and non-face-to-face time personally spent by the physician and/or other qualified health care professional(s) on the day of the encounter (includes time in activities that require the physician or other qualified health care professional and does not include time in activities normally performed by clinical staff). Physician's time may include the following activities when performed: Preparing to see the patient (e.g., pre-charting review of records, searching for previously ordered imaging, lab work, and nerve conduction tests) Review of prior analgesic pharmacotherapies. Reviewing PMP Interpreting ordered tests (e.g., lab work, imaging, nerve conduction tests) Performing post-procedure evaluations, including interpretation of diagnostic procedures Obtaining and/or reviewing separately obtained history Performing a medically appropriate examination and/or evaluation Counseling and educating the patient/family/caregiver Ordering medications, tests, or procedures Referring and communicating with other health care professionals (when not separately reported) Documenting clinical information in the electronic or other health record Independently interpreting results (not separately reported) and communicating results to the patient/ family/caregiver Care coordination (not separately reported)  Note by: Edward Jolly, MD Date: 04/23/2023; Time: 3:08 PM

## 2023-04-23 NOTE — Patient Instructions (Signed)

## 2023-04-24 DIAGNOSIS — Z6838 Body mass index (BMI) 38.0-38.9, adult: Secondary | ICD-10-CM | POA: Diagnosis not present

## 2023-04-24 DIAGNOSIS — E063 Autoimmune thyroiditis: Secondary | ICD-10-CM | POA: Diagnosis not present

## 2023-04-24 DIAGNOSIS — J449 Chronic obstructive pulmonary disease, unspecified: Secondary | ICD-10-CM | POA: Diagnosis not present

## 2023-04-24 DIAGNOSIS — R5383 Other fatigue: Secondary | ICD-10-CM | POA: Diagnosis not present

## 2023-04-24 DIAGNOSIS — E6609 Other obesity due to excess calories: Secondary | ICD-10-CM | POA: Diagnosis not present

## 2023-04-24 DIAGNOSIS — I10 Essential (primary) hypertension: Secondary | ICD-10-CM | POA: Diagnosis not present

## 2023-04-24 DIAGNOSIS — Z0001 Encounter for general adult medical examination with abnormal findings: Secondary | ICD-10-CM | POA: Diagnosis not present

## 2023-04-24 DIAGNOSIS — Z1331 Encounter for screening for depression: Secondary | ICD-10-CM | POA: Diagnosis not present

## 2023-04-28 DIAGNOSIS — R5383 Other fatigue: Secondary | ICD-10-CM | POA: Diagnosis not present

## 2023-04-28 DIAGNOSIS — Z0001 Encounter for general adult medical examination with abnormal findings: Secondary | ICD-10-CM | POA: Diagnosis not present

## 2023-06-03 ENCOUNTER — Encounter: Payer: Self-pay | Admitting: Student in an Organized Health Care Education/Training Program

## 2023-06-03 ENCOUNTER — Ambulatory Visit
Admission: RE | Admit: 2023-06-03 | Discharge: 2023-06-03 | Disposition: A | Discharge status: Home / Self Care | Payer: 59 | Source: Ambulatory Visit | Attending: Student in an Organized Health Care Education/Training Program | Admitting: Student in an Organized Health Care Education/Training Program

## 2023-06-03 ENCOUNTER — Ambulatory Visit
Payer: 59 | Attending: Student in an Organized Health Care Education/Training Program | Admitting: Student in an Organized Health Care Education/Training Program

## 2023-06-03 DIAGNOSIS — G894 Chronic pain syndrome: Secondary | ICD-10-CM | POA: Insufficient documentation

## 2023-06-03 DIAGNOSIS — M47816 Spondylosis without myelopathy or radiculopathy, lumbar region: Secondary | ICD-10-CM

## 2023-06-03 MED ORDER — DEXAMETHASONE SODIUM PHOSPHATE 10 MG/ML IJ SOLN
10.0000 mg | Freq: Once | INTRAMUSCULAR | Status: AC
  Administered 2023-06-03: 10 mg
  Filled 2023-06-03: qty 1

## 2023-06-03 MED ORDER — MIDAZOLAM HCL 2 MG/2ML IJ SOLN
0.5000 mg | Freq: Once | INTRAMUSCULAR | Status: AC
  Administered 2023-06-03: 2 mg via INTRAVENOUS
  Filled 2023-06-03: qty 2

## 2023-06-03 MED ORDER — ROPIVACAINE HCL 2 MG/ML IJ SOLN
9.0000 mL | Freq: Once | INTRAMUSCULAR | Status: AC
  Administered 2023-06-03: 9 mL via PERINEURAL
  Filled 2023-06-03: qty 20

## 2023-06-03 MED ORDER — LIDOCAINE HCL 2 % IJ SOLN
20.0000 mL | Freq: Once | INTRAMUSCULAR | Status: AC
  Administered 2023-06-03: 400 mg
  Filled 2023-06-03: qty 40

## 2023-06-03 MED ORDER — ROPIVACAINE HCL 2 MG/ML IJ SOLN
9.0000 mL | Freq: Once | INTRAMUSCULAR | Status: AC
  Administered 2023-06-03: 9 mL via PERINEURAL

## 2023-06-03 MED ORDER — LACTATED RINGERS IV SOLN: Freq: Once | INTRAVENOUS | Status: AC

## 2023-06-03 NOTE — Patient Instructions (Signed)

## 2023-06-03 NOTE — Progress Notes (Signed)
Safety precautions to be maintained throughout the outpatient stay will include: orient to surroundings, keep bed in low position, maintain call bell within reach at all times, provide assistance with transfer out of bed and ambulation.  

## 2023-06-03 NOTE — Progress Notes (Signed)
PROVIDER NOTE: Interpretation of information contained herein should be left to medically-trained personnel. Specific patient instructions are provided elsewhere under "Patient Instructions" section of medical record. This document was created in part using STT-dictation technology, any transcriptional errors that may result from this process are unintentional.  Patient: [name] Type: Established DOB: 03/18/1966 MRN: [id number] PCP: Assunta Found, MD  Service: Procedure DOS: 06/03/2023 Setting: Ambulatory Location: Ambulatory outpatient facility Delivery: Face-to-face Provider: Edward Jolly, MD Specialty: Interventional Pain Management Specialty designation: 09 Location: Outpatient facility Ref. Prov.: Edward Jolly, MD       Interventional Therapy   Procedure: Lumbar Facet, Medial Branch Block(s) #2  Laterality: Bilateral  Level: L3, L4, and L5 Medial Branch Level(s). Injecting these levels blocks the L3-4 and L4-5 lumbar facet joints. Imaging: Fluoroscopic guidance  Anesthesia: Local anesthesia (1-2% Lidocaine) Anxiolysis: IV Versed         DOS: 06/03/2023 Performed by: Edward Jolly, MD  Primary Purpose: Diagnostic/Therapeutic Indications: Low back pain severe enough to impact quality of life or function. 1. Lumbar facet arthropathy   2. Lumbar spondylosis   3. Chronic pain syndrome    NAS-11 Pain score:   Pre-procedure: 7 /10   Post-procedure: 3 /10     Position / Prep / Materials:  Position: Prone  Prep solution: DuraPrep (Iodine Povacrylex [0.7% available iodine] and Isopropyl Alcohol, 74% w/w) Area Prepped: Posterolateral Lumbosacral Spine (Wide prep: From the lower border of the scapula down to the end of the tailbone and from flank to flank.)  Materials:  Tray: Block Needle(s):  Type: Spinal  Gauge (G): 22  Length: 5-in Qty: 2      Pre-op H&P Assessment:  [name] is a 57 y.o. (year old), female patient, seen today for interventional treatment. She   has a past surgical history that includes Nasal sinus surgery; Knee arthroscopy with medial menisectomy (Left, 05/04/2015); Colonoscopy (N/A, 11/12/2015); Total knee arthroplasty (Left, 01/29/2017); Joint replacement; Rotator cuff repair; Shoulder surgery (Right, 10/12/2018); Shoulder arthroscopy (Right, 10/12/2018); and Breast reduction surgery (Bilateral, 04/17/2020). [name] has a current medication list which includes the following prescription(s): acetaminophen, albuterol, budesonide-formoterol, diclofenac, hydrochlorothiazide, levothyroxine, magnesium, olmesartan-hydrochlorothiazide, omega-3 fatty acids, ondansetron, rosuvastatin, bupropion, and escitalopram, and the following Facility-Administered Medications: lactated ringers. Her primarily concern today is the Back Pain (lower)  Initial Vital Signs:  Pulse/HCG Rate: 71ECG Heart Rate: 79 Temp: (!) 97.4 F (36.3 C) Resp: 17 BP: 126/71 SpO2: 100 %  BMI: Estimated body mass index is 39.5 kg/m as calculated from the following:   Height as of this encounter: 5\' 3"  (1.6 m).   Weight as of this encounter: 223 lb (101.2 kg).  Risk Assessment: Allergies: Reviewed. She is allergic to penicillins.  Allergy Precautions: None required Coagulopathies: Reviewed. None identified.  Blood-thinner therapy: None at this time Active Infection(s): Reviewed. None identified. [name] is afebrile  Site Confirmation: [name] was asked to confirm the procedure and laterality before marking the site Procedure checklist: Completed Consent: Before the procedure and under the influence of no sedative(s), amnesic(s), or anxiolytics, the patient was informed of the treatment options, risks and possible complications. To fulfill our ethical and legal obligations, as recommended by the American Medical Association's Code of Ethics, I have informed the patient of my clinical impression; the nature and purpose of the treatment or procedure; the risks, benefits, and  possible complications of the intervention; the alternatives, including doing nothing; the risk(s) and benefit(s) of the alternative treatment(s) or procedure(s); and the risk(s) and benefit(s) of  doing nothing. The patient was provided information about the general risks and possible complications associated with the procedure. These may include, but are not limited to: failure to achieve desired goals, infection, bleeding, organ or nerve damage, allergic reactions, paralysis, and death. In addition, the patient was informed of those risks and complications associated to Spine-related procedures, such as failure to decrease pain; infection (i.e.: Meningitis, epidural or intraspinal abscess); bleeding (i.e.: epidural hematoma, subarachnoid hemorrhage, or any other type of intraspinal or peri-dural bleeding); organ or nerve damage (i.e.: Any type of peripheral nerve, nerve root, or spinal cord injury) with subsequent damage to sensory, motor, and/or autonomic systems, resulting in permanent pain, numbness, and/or weakness of one or several areas of the body; allergic reactions; (i.e.: anaphylactic reaction); and/or death. Furthermore, the patient was informed of those risks and complications associated with the medications. These include, but are not limited to: allergic reactions (i.e.: anaphylactic or anaphylactoid reaction(s)); adrenal axis suppression; blood sugar elevation that in diabetics may result in ketoacidosis or comma; water retention that in patients with history of congestive heart failure may result in shortness of breath, pulmonary edema, and decompensation with resultant heart failure; weight gain; swelling or edema; medication-induced neural toxicity; particulate matter embolism and blood vessel occlusion with resultant organ, and/or nervous system infarction; and/or aseptic necrosis of one or more joints. Finally, the patient was informed that Medicine is not an exact science; therefore, there  is also the possibility of unforeseen or unpredictable risks and/or possible complications that may result in a catastrophic outcome. The patient indicated having understood very clearly. We have given the patient no guarantees and we have made no promises. Enough time was given to the patient to ask questions, all of which were answered to the patient's satisfaction. Ms. Town has indicated that she wanted to continue with the procedure. Attestation: I, the ordering provider, attest that I have discussed with the patient the benefits, risks, side-effects, alternatives, likelihood of achieving goals, and potential problems during recovery for the procedure that I have provided informed consent. Date  Time: 06/03/2023  9:59 AM   Pre-Procedure Preparation:  Monitoring: As per clinic protocol. Respiration, ETCO2, SpO2, BP, heart rate and rhythm monitor placed and checked for adequate function Safety Precautions: Patient was assessed for positional comfort and pressure points before starting the procedure. Time-out: I initiated and conducted the "Time-out" before starting the procedure, as per protocol. The patient was asked to participate by confirming the accuracy of the "Time Out" information. Verification of the correct person, site, and procedure were performed and confirmed by me, the nursing staff, and the patient. "Time-out" conducted as per Joint Commission's Universal Protocol (UP.01.01.01). Time: 1026 Start Time: 1027 hrs.  Description of Procedure:          Laterality: (see above) Targeted Levels: (see above)  Safety Precautions: Aspiration looking for blood return was conducted prior to all injections. At no point did we inject any substances, as a needle was being advanced. Before injecting, the patient was told to immediately notify me if she was experiencing any new onset of "ringing in the ears, or metallic taste in the mouth". No attempts were made at seeking any paresthesias. Safe  injection practices and needle disposal techniques used. Medications properly checked for expiration dates. SDV (single dose vial) medications used. After the completion of the procedure, all disposable equipment used was discarded in the proper designated medical waste containers. Local Anesthesia: Protocol guidelines were followed. The patient was positioned over the fluoroscopy table.  The area was prepped in the usual manner. The time-out was completed. The target area was identified using fluoroscopy. A 12-in long, straight, sterile hemostat was used with fluoroscopic guidance to locate the targets for each level blocked. Once located, the skin was marked with an approved surgical skin marker. Once all sites were marked, the skin (epidermis, dermis, and hypodermis), as well as deeper tissues (fat, connective tissue and muscle) were infiltrated with a small amount of a short-acting local anesthetic, loaded on a 10cc syringe with a 25G, 1.5-in  Needle. An appropriate amount of time was allowed for local anesthetics to take effect before proceeding to the next step. Local Anesthetic: Lidocaine 2.0% The unused portion of the local anesthetic was discarded in the proper designated containers. Technical description of process:  L3 Medial Branch Nerve Block (MBB): The target area for the L3 medial branch is at the junction of the postero-lateral aspect of the superior articular process and the superior, posterior, and medial edge of the transverse process of L4. Under fluoroscopic guidance, a Quincke needle was inserted until contact was made with os over the superior postero-lateral aspect of the pedicular shadow (target area). After negative aspiration for blood, 2 mL of the nerve block solution was injected without difficulty or complication. The needle was removed intact. L4 Medial Branch Nerve Block (MBB): The target area for the L4 medial branch is at the junction of the postero-lateral aspect of the  superior articular process and the superior, posterior, and medial edge of the transverse process of L5. Under fluoroscopic guidance, a Quincke needle was inserted until contact was made with os over the superior postero-lateral aspect of the pedicular shadow (target area). After negative aspiration for blood, 2mL of the nerve block solution was injected without difficulty or complication. The needle was removed intact. L5 Medial Branch Nerve Block (MBB): The target area for the L5 medial branch is at the junction of the postero-lateral aspect of the superior articular process and the superior, posterior, and medial edge of the sacral ala. Under fluoroscopic guidance, a Quincke needle was inserted until contact was made with os over the superior postero-lateral aspect of the pedicular shadow (target area). After negative aspiration for blood, 2mL of the nerve block solution was injected without difficulty or complication. The needle was removed intact.   Once the entire procedure was completed, the treated area was cleaned, making sure to leave some of the prepping solution back to take advantage of its long term bactericidal properties.         Illustration of the posterior view of the lumbar spine and the posterior neural structures. Laminae of L2 through S1 are labeled. DPRL5, dorsal primary ramus of L5; DPRS1, dorsal primary ramus of S1; DPR3, dorsal primary ramus of L3; FJ, facet (zygapophyseal) joint L3-L4; I, inferior articular process of L4; LB1, lateral branch of dorsal primary ramus of L1; IAB, inferior articular branches from L3 medial branch (supplies L4-L5 facet joint); IBP, intermediate branch plexus; MB3, medial branch of dorsal primary ramus of L3; NR3, third lumbar nerve root; S, superior articular process of L5; SAB, superior articular branches from L4 (supplies L4-5 facet joint also); TP3, transverse process of L3.   Facet Joint Innervation (* possible contribution)  L1-2 T12, L1  (L2*)  Medial Branch  L2-3 L1, L2 (L3*)         "          "  L3-4 L2, L3 (L4*)         "          "  L4-5 L3, L4 (L5*)         "          "  L5-S1 L4, L5, S1          "          "    Vitals:   06/03/23 1025 06/03/23 1030 06/03/23 1035 06/03/23 1042  BP: 118/72 108/61 124/88 124/73  Pulse:      Resp: 17 16 16 14   Temp:      SpO2: 99% 96% 96% 99%  Weight:      Height:         End Time: 1034 hrs.  Imaging Guidance (Spinal):          Type of Imaging Technique: Fluoroscopy Guidance (Spinal) Indication(s): Assistance in needle guidance and placement for procedures requiring needle placement in or near specific anatomical locations not easily accessible without such assistance. Exposure Time: Please see nurses notes. Contrast: None used. Fluoroscopic Guidance: I was personally present during the use of fluoroscopy. "Tunnel Vision Technique" used to obtain the best possible view of the target area. Parallax error corrected before commencing the procedure. "Direction-depth-direction" technique used to introduce the needle under continuous pulsed fluoroscopy. Once target was reached, antero-posterior, oblique, and lateral fluoroscopic projection used confirm needle placement in all planes. Images permanently stored in EMR. Interpretation: No contrast injected. I personally interpreted the imaging intraoperatively. Adequate needle placement confirmed in multiple planes. Permanent images saved into the patient's record.  Post-operative Assessment:  Post-procedure Vital Signs:  Pulse/HCG Rate: 7170 Temp: (!) 97.4 F (36.3 C) Resp: 14 BP: 124/73 SpO2: 99 %  EBL: None  Complications: No immediate post-treatment complications observed by team, or reported by patient.  Note: The patient tolerated the entire procedure well. A repeat set of vitals were taken after the procedure and the patient was kept under observation following institutional policy, for this type of procedure.  Post-procedural neurological assessment was performed, showing return to baseline, prior to discharge. The patient was provided with post-procedure discharge instructions, including a section on how to identify potential problems. Should any problems arise concerning this procedure, the patient was given instructions to immediately contact us, at any time, without hesitation. In any case, we plan to contact the patient by telephone for a follow-up status report regarding this interventional procedure.  Comments:  No additional relevant information.  Plan of Care (POC)  Orders:  Orders Placed This Encounter  Procedures   DG PAIN [facility] C-ARM 1-60 MIN NO REPORT    Intraoperative interpretation by procedural physician at [facility].    Standing Status:   Standing    Number of Occurrences:   1    Order Specific Question:   Reason for exam:    Answer:   Assistance in needle guidance and placement for procedures requiring needle placement in or near specific anatomical locations not easily accessible without such assistance.     Medications ordered for procedure: Meds ordered this encounter  Medications   lidocaine (XYLOCAINE) 2 % (with pres) injection 400 mg   lactated ringers infusion   midazolam (VERSED) injection 0.5-2 mg    Make sure Flumazenil is available in the pyxis when using this medication. If oversedation occurs, administer 0.2 mg IV over 15 sec. If after 45 sec no response, administer 0.2 mg again over 1 min; may repeat at 1 min intervals; not to exceed 4 doses (1 mg)   dexamethasone (DECADRON) injection 10 mg   dexamethasone (DECADRON) injection 10  mg   ropivacaine (PF) 2 mg/mL (0.2%) (NAROPIN) injection 9 mL   ropivacaine (PF) 2 mg/mL (0.2%) (NAROPIN) injection 9 mL   Medications administered: We administered lidocaine, lactated ringers, midazolam, dexamethasone, dexamethasone, ropivacaine (PF) 2 mg/mL (0.2%), and ropivacaine (PF) 2 mg/mL (0.2%).  See the medical  record for exact dosing, route, and time of administration.  Follow-up plan:   Return in about 4 weeks (around 07/01/2023) for VV for PPE.       B/L L3,4,5 MBNB #1 02/25/23, 06/03/23    Recent Visits Date Type Provider Dept  04/23/23 Office Visit Edward Jolly, MD [facility]  Showing recent visits within past 90 days and meeting all other requirements Today's Visits Date Type Provider Dept  06/03/23 Procedure visit Edward Jolly, MD [facility]  Showing today's visits and meeting all other requirements Future Appointments Date Type Provider Dept  07/01/23 Appointment Edward Jolly, MD [facility]  Showing future appointments within next 90 days and meeting all other requirements  Disposition: Discharge home  Discharge (Date  Time): 06/03/2023; 1045 hrs.   Primary Care Physician: Assunta Found, MD Location: [facility] Note by: Edward Jolly, MD (TTS technology used. I apologize for any typographical errors that were not detected and corrected.) Date: 06/03/2023; Time: 10:51 AM  Disclaimer:  Medicine is not an Visual merchandiser. The only guarantee in medicine is that nothing is guaranteed. It is important to note that the decision to proceed with this intervention was based on the information collected from the patient. The Data and conclusions were drawn from the patient's questionnaire, the interview, and the physical examination. Because the information was provided in large part by the patient, it cannot be guaranteed that it has not been purposely or unconsciously manipulated. Every effort has been made to obtain as much relevant data as possible for this evaluation. It is important to note that the conclusions that lead to this procedure are derived in large part from the available data. Always take into account that the treatment will also be dependent on availability of resources and existing treatment guidelines, considered by  other Pain Management Practitioners as being common knowledge and practice, at the time of the intervention. For Medico-Legal purposes, it is also important to point out that variation in procedural techniques and pharmacological choices are the acceptable norm. The indications, contraindications, technique, and results of the above procedure should only be interpreted and judged by a Board-Certified Interventional Pain Specialist with extensive familiarity and expertise in the same exact procedure and technique.

## 2023-06-04 ENCOUNTER — Telehealth: Payer: Self-pay | Admitting: *Deleted

## 2023-06-04 ENCOUNTER — Telehealth: Payer: Self-pay | Admitting: Student in an Organized Health Care Education/Training Program

## 2023-06-04 NOTE — Telephone Encounter (Signed)
Patient reassured that soreness is expected following MBNB.

## 2023-06-04 NOTE — Telephone Encounter (Signed)
PT states that she was returning a call back to nurse. PT states that she is sore from the procedure done on yesterday. Please give patient a call. TY

## 2023-06-04 NOTE — Telephone Encounter (Signed)
Attempted to call for post procedure follow-up. Message left. 

## 2023-06-08 ENCOUNTER — Inpatient Hospital Stay (HOSPITAL_COMMUNITY)
Admission: EM | Admit: 2023-06-08 | Discharge: 2023-06-09 | DRG: 190 | Disposition: A | Discharge status: Home / Self Care | Payer: 59 | Attending: Internal Medicine | Admitting: Internal Medicine

## 2023-06-08 ENCOUNTER — Encounter (HOSPITAL_COMMUNITY): Payer: Self-pay

## 2023-06-08 ENCOUNTER — Other Ambulatory Visit: Payer: Self-pay

## 2023-06-08 ENCOUNTER — Emergency Department (HOSPITAL_COMMUNITY): Payer: 59

## 2023-06-08 DIAGNOSIS — Z808 Family history of malignant neoplasm of other organs or systems: Secondary | ICD-10-CM | POA: Diagnosis not present

## 2023-06-08 DIAGNOSIS — R062 Wheezing: Secondary | ICD-10-CM | POA: Diagnosis not present

## 2023-06-08 DIAGNOSIS — E785 Hyperlipidemia, unspecified: Secondary | ICD-10-CM | POA: Diagnosis present

## 2023-06-08 DIAGNOSIS — Z8639 Personal history of other endocrine, nutritional and metabolic disease: Secondary | ICD-10-CM | POA: Diagnosis not present

## 2023-06-08 DIAGNOSIS — Z833 Family history of diabetes mellitus: Secondary | ICD-10-CM | POA: Diagnosis not present

## 2023-06-08 DIAGNOSIS — Z880 Allergy status to penicillin: Secondary | ICD-10-CM

## 2023-06-08 DIAGNOSIS — Z79890 Hormone replacement therapy (postmenopausal): Secondary | ICD-10-CM | POA: Diagnosis not present

## 2023-06-08 DIAGNOSIS — J9601 Acute respiratory failure with hypoxia: Secondary | ICD-10-CM | POA: Diagnosis present

## 2023-06-08 DIAGNOSIS — Z1152 Encounter for screening for COVID-19: Secondary | ICD-10-CM | POA: Diagnosis not present

## 2023-06-08 DIAGNOSIS — J449 Chronic obstructive pulmonary disease, unspecified: Secondary | ICD-10-CM | POA: Diagnosis present

## 2023-06-08 DIAGNOSIS — Z820 Family history of epilepsy and other diseases of the nervous system: Secondary | ICD-10-CM | POA: Diagnosis not present

## 2023-06-08 DIAGNOSIS — R0602 Shortness of breath: Secondary | ICD-10-CM | POA: Diagnosis not present

## 2023-06-08 DIAGNOSIS — J441 Chronic obstructive pulmonary disease with (acute) exacerbation: Principal | ICD-10-CM | POA: Diagnosis present

## 2023-06-08 DIAGNOSIS — I10 Essential (primary) hypertension: Secondary | ICD-10-CM | POA: Diagnosis not present

## 2023-06-08 DIAGNOSIS — Z803 Family history of malignant neoplasm of breast: Secondary | ICD-10-CM

## 2023-06-08 DIAGNOSIS — Z96652 Presence of left artificial knee joint: Secondary | ICD-10-CM | POA: Diagnosis present

## 2023-06-08 DIAGNOSIS — Z7951 Long term (current) use of inhaled steroids: Secondary | ICD-10-CM | POA: Diagnosis not present

## 2023-06-08 DIAGNOSIS — F32A Depression, unspecified: Secondary | ICD-10-CM | POA: Diagnosis present

## 2023-06-08 DIAGNOSIS — Z6838 Body mass index (BMI) 38.0-38.9, adult: Secondary | ICD-10-CM

## 2023-06-08 DIAGNOSIS — K219 Gastro-esophageal reflux disease without esophagitis: Secondary | ICD-10-CM | POA: Diagnosis present

## 2023-06-08 DIAGNOSIS — J479 Bronchiectasis, uncomplicated: Secondary | ICD-10-CM | POA: Diagnosis not present

## 2023-06-08 DIAGNOSIS — Z79899 Other long term (current) drug therapy: Secondary | ICD-10-CM | POA: Diagnosis not present

## 2023-06-08 DIAGNOSIS — Z8249 Family history of ischemic heart disease and other diseases of the circulatory system: Secondary | ICD-10-CM

## 2023-06-08 DIAGNOSIS — E039 Hypothyroidism, unspecified: Secondary | ICD-10-CM | POA: Diagnosis not present

## 2023-06-08 DIAGNOSIS — E66812 Obesity, class 2: Secondary | ICD-10-CM | POA: Diagnosis present

## 2023-06-08 DIAGNOSIS — E669 Obesity, unspecified: Secondary | ICD-10-CM | POA: Diagnosis present

## 2023-06-08 LAB — BRAIN NATRIURETIC PEPTIDE: B Natriuretic Peptide: 61 pg/mL (ref 0.0–100.0)

## 2023-06-08 LAB — CBC WITH DIFFERENTIAL/PLATELET
Abs Immature Granulocytes: 0.05 10*3/uL (ref 0.00–0.07)
Basophils Absolute: 0.1 10*3/uL (ref 0.0–0.1)
Basophils Relative: 1 %
Eosinophils Absolute: 0.7 10*3/uL — ABNORMAL HIGH (ref 0.0–0.5)
Eosinophils Relative: 7 %
HCT: 41.2 % (ref 36.0–46.0)
Hemoglobin: 13.7 g/dL (ref 12.0–15.0)
Immature Granulocytes: 1 %
Lymphocytes Relative: 22 %
Lymphs Abs: 2.1 10*3/uL (ref 0.7–4.0)
MCH: 29.2 pg (ref 26.0–34.0)
MCHC: 33.3 g/dL (ref 30.0–36.0)
MCV: 87.8 fL (ref 80.0–100.0)
Monocytes Absolute: 0.7 10*3/uL (ref 0.1–1.0)
Monocytes Relative: 7 %
Neutro Abs: 6 10*3/uL (ref 1.7–7.7)
Neutrophils Relative %: 62 %
Platelets: 174 10*3/uL (ref 150–400)
RBC: 4.69 MIL/uL (ref 3.87–5.11)
RDW: 13.2 % (ref 11.5–15.5)
WBC: 9.5 10*3/uL (ref 4.0–10.5)
nRBC: 0 % (ref 0.0–0.2)

## 2023-06-08 LAB — BASIC METABOLIC PANEL
Anion gap: 12 (ref 5–15)
BUN: 14 mg/dL (ref 6–20)
CO2: 23 mmol/L (ref 22–32)
Calcium: 8.3 mg/dL — ABNORMAL LOW (ref 8.9–10.3)
Chloride: 103 mmol/L (ref 98–111)
Creatinine, Ser: 0.75 mg/dL (ref 0.44–1.00)
GFR, Estimated: >60 mL/min (ref >60)
Glucose, Bld: 144 mg/dL — ABNORMAL HIGH (ref 70–99)
Potassium: 3.7 mmol/L (ref 3.5–5.1)
Sodium: 138 mmol/L (ref 135–145)

## 2023-06-08 LAB — RESP PANEL BY RT-PCR (RSV, FLU A&B, COVID)  RVPGX2
Influenza A by PCR: NEGATIVE
Influenza B by PCR: NEGATIVE
Resp Syncytial Virus by PCR: NEGATIVE
SARS Coronavirus 2 by RT PCR: NEGATIVE

## 2023-06-08 LAB — TROPONIN I (HIGH SENSITIVITY): Troponin I (High Sensitivity): 3 ng/L (ref <18)

## 2023-06-08 MED ORDER — BUDESONIDE 0.5 MG/2ML IN SUSP
0.5000 mg | Freq: Two times a day (BID) | RESPIRATORY_TRACT | Status: DC
  Administered 2023-06-08 – 2023-06-09 (×3): 0.5 mg via RESPIRATORY_TRACT
  Filled 2023-06-08 (×3): qty 2

## 2023-06-08 MED ORDER — ENOXAPARIN SODIUM 40 MG/0.4ML IJ SOSY: 40.0000 mg | PREFILLED_SYRINGE | INTRAMUSCULAR | Status: DC

## 2023-06-08 MED ORDER — METHYLPREDNISOLONE SODIUM SUCC 125 MG IJ SOLR
125.0000 mg | Freq: Once | INTRAMUSCULAR | Status: AC
  Administered 2023-06-08: 125 mg via INTRAVENOUS
  Filled 2023-06-08: qty 2

## 2023-06-08 MED ORDER — PANTOPRAZOLE SODIUM 40 MG PO TBEC
40.0000 mg | DELAYED_RELEASE_TABLET | Freq: Every day | ORAL | Status: DC
  Administered 2023-06-08 – 2023-06-09 (×2): 40 mg via ORAL
  Filled 2023-06-08 (×2): qty 1

## 2023-06-08 MED ORDER — IPRATROPIUM-ALBUTEROL 0.5-2.5 (3) MG/3ML IN SOLN
3.0000 mL | Freq: Once | RESPIRATORY_TRACT | Status: AC
  Administered 2023-06-08: 3 mL via RESPIRATORY_TRACT
  Filled 2023-06-08: qty 3

## 2023-06-08 MED ORDER — ALBUTEROL SULFATE (2.5 MG/3ML) 0.083% IN NEBU: 2.5000 mg | INHALATION_SOLUTION | RESPIRATORY_TRACT | Status: DC | PRN

## 2023-06-08 MED ORDER — HYDROCHLOROTHIAZIDE 25 MG PO TABS
25.0000 mg | ORAL_TABLET | Freq: Every day | ORAL | Status: DC
  Administered 2023-06-09: 25 mg via ORAL
  Filled 2023-06-08: qty 1

## 2023-06-08 MED ORDER — IRBESARTAN 150 MG PO TABS
300.0000 mg | ORAL_TABLET | Freq: Every day | ORAL | Status: DC
  Administered 2023-06-08 – 2023-06-09 (×2): 300 mg via ORAL
  Filled 2023-06-08 (×2): qty 2

## 2023-06-08 MED ORDER — ALBUTEROL SULFATE (2.5 MG/3ML) 0.083% IN NEBU
INHALATION_SOLUTION | RESPIRATORY_TRACT | Status: AC
  Administered 2023-06-08: 5 mg
  Filled 2023-06-08: qty 6

## 2023-06-08 MED ORDER — ALBUTEROL SULFATE HFA 108 (90 BASE) MCG/ACT IN AERS
2.0000 | INHALATION_SPRAY | RESPIRATORY_TRACT | Status: DC | PRN
  Administered 2023-06-08: 2 via RESPIRATORY_TRACT
  Filled 2023-06-08: qty 6.7

## 2023-06-08 MED ORDER — ONDANSETRON HCL 4 MG/2ML IJ SOLN
4.0000 mg | Freq: Once | INTRAMUSCULAR | Status: AC
  Administered 2023-06-08: 4 mg via INTRAVENOUS
  Filled 2023-06-08: qty 2

## 2023-06-08 MED ORDER — LEVOTHYROXINE SODIUM 50 MCG PO TABS
25.0000 ug | ORAL_TABLET | Freq: Every day | ORAL | Status: DC
  Administered 2023-06-09: 25 ug via ORAL
  Filled 2023-06-08: qty 1

## 2023-06-08 MED ORDER — DOXYCYCLINE HYCLATE 100 MG PO TABS
100.0000 mg | ORAL_TABLET | Freq: Two times a day (BID) | ORAL | Status: DC
  Administered 2023-06-08 – 2023-06-09 (×3): 100 mg via ORAL
  Filled 2023-06-08 (×4): qty 1

## 2023-06-08 MED ORDER — METHYLPREDNISOLONE SODIUM SUCC 40 MG IJ SOLR
40.0000 mg | Freq: Two times a day (BID) | INTRAMUSCULAR | Status: DC
  Administered 2023-06-08 – 2023-06-09 (×2): 40 mg via INTRAVENOUS
  Filled 2023-06-08 (×2): qty 1

## 2023-06-08 MED ORDER — ACETAMINOPHEN 650 MG RE SUPP: 650.0000 mg | Freq: Four times a day (QID) | RECTAL | Status: DC | PRN

## 2023-06-08 MED ORDER — BUPROPION HCL ER (XL) 150 MG PO TB24: 150.0000 mg | ORAL_TABLET | Freq: Every day | ORAL | Status: DC

## 2023-06-08 MED ORDER — ONDANSETRON HCL 4 MG PO TABS: 4.0000 mg | ORAL_TABLET | Freq: Four times a day (QID) | ORAL | Status: DC | PRN

## 2023-06-08 MED ORDER — ESCITALOPRAM OXALATE 10 MG PO TABS: 25.0000 mg | ORAL_TABLET | Freq: Every day | ORAL | Status: DC

## 2023-06-08 MED ORDER — ONDANSETRON HCL 4 MG/2ML IJ SOLN: 4.0000 mg | Freq: Four times a day (QID) | INTRAMUSCULAR | Status: DC | PRN

## 2023-06-08 MED ORDER — DM-GUAIFENESIN ER 30-600 MG PO TB12
1.0000 | ORAL_TABLET | Freq: Two times a day (BID) | ORAL | Status: DC
  Administered 2023-06-08 – 2023-06-09 (×3): 1 via ORAL
  Filled 2023-06-08 (×3): qty 1

## 2023-06-08 MED ORDER — ALBUTEROL SULFATE (2.5 MG/3ML) 0.083% IN NEBU
INHALATION_SOLUTION | RESPIRATORY_TRACT | Status: AC
  Administered 2023-06-08: 2.5 mg
  Filled 2023-06-08: qty 3

## 2023-06-08 MED ORDER — OLMESARTAN MEDOXOMIL-HCTZ 40-25 MG PO TABS: 1.0000 | ORAL_TABLET | Freq: Every day | ORAL | Status: DC

## 2023-06-08 MED ORDER — ACETAMINOPHEN 325 MG PO TABS
650.0000 mg | ORAL_TABLET | Freq: Four times a day (QID) | ORAL | Status: DC | PRN
  Administered 2023-06-08: 650 mg via ORAL
  Filled 2023-06-08: qty 2

## 2023-06-08 MED ORDER — IPRATROPIUM-ALBUTEROL 0.5-2.5 (3) MG/3ML IN SOLN
3.0000 mL | Freq: Four times a day (QID) | RESPIRATORY_TRACT | Status: DC
  Administered 2023-06-08 – 2023-06-09 (×4): 3 mL via RESPIRATORY_TRACT
  Filled 2023-06-08 (×4): qty 3

## 2023-06-08 MED ORDER — ROSUVASTATIN CALCIUM 5 MG PO TABS
5.0000 mg | ORAL_TABLET | Freq: Every day | ORAL | Status: DC
  Administered 2023-06-08 – 2023-06-09 (×2): 5 mg via ORAL
  Filled 2023-06-08 (×2): qty 1

## 2023-06-08 MED ORDER — ARFORMOTEROL TARTRATE 15 MCG/2ML IN NEBU
15.0000 ug | INHALATION_SOLUTION | Freq: Two times a day (BID) | RESPIRATORY_TRACT | Status: DC
  Administered 2023-06-08 – 2023-06-09 (×3): 15 ug via RESPIRATORY_TRACT
  Filled 2023-06-08 (×3): qty 2

## 2023-06-08 NOTE — H&P (Signed)
History and Physical    Patient: [name] UVO:[id number] DOB: [birth year] DOA: 06/08/2023 DOS: the patient was seen and examined on 06/08/2023 PCP: Assunta Found, MD  Patient coming from: Home  Chief Complaint:  Chief Complaint  Patient presents with   Shortness of Breath   HPI: [name] is a 57 y.o. female with medical history significant of COPD, hypothyroidism, hypertension, gastroesophageal reflux disease and class II obesity; presenting to the hospital secondary to shortness of breath, intermittent coughing spells and increased wheezing.  Patient reports symptom has been present for the last 3 days or so and worsening.  At home she has inhaler and nebulizer machine and despite provided treatment symptoms has failed to improve. No fever, no chest pain, no nausea, no vomiting, no sick contacts.  In the ED chest x-ray demonstrating bronchitic changes, diffuse expiratory wheezing on exam and the presence of hypoxia especially on exertion.  2 L nasal cannula supplementation in place at time of my evaluation.  COVID PCR negative.  Review of Systems: As mentioned in the history of present illness. All other systems reviewed and are negative. Past Medical History:  Diagnosis Date   Arthritis    Back pain 10/30/2015   Bilateral shoulder pain 10/30/2015   COPD (chronic obstructive pulmonary disease) (HCC)    Fecal occult blood test positive 10/30/2015   Hemorrhoids 10/30/2015   Hypertension    Hypothyroidism    Large breasts 10/30/2015   43 DD   Postmenopausal 10/30/2015   Sleep apnea    pt DX but did not want to use CPAP.   Thyroid disease    Past Surgical History:  Procedure Laterality Date   BREAST REDUCTION SURGERY Bilateral 04/17/2020   Procedure: MAMMARY REDUCTION  (BREAST);  Surgeon: Allena Napoleon, MD;  Location: [facility];  Service: Plastics;  Laterality: Bilateral;  2.5 hours, please   COLONOSCOPY N/A 11/12/2015   Procedure: COLONOSCOPY;   Surgeon: West Bali, MD;  Location: [facility];  Service: Endoscopy;  Laterality: N/A;  8:30 AM   JOINT REPLACEMENT     KNEE ARTHROSCOPY WITH MEDIAL MENISECTOMY Left 05/04/2015   Procedure: KNEE ARTHROSCOPY WITH MEDIAL AND LATERAL MENISECTOMY;  Surgeon: Vickki Hearing, MD;  Location: [facility];  Service: Orthopedics;  Laterality: Left;   NASAL SINUS SURGERY     ROTATOR CUFF REPAIR     SHOULDER ARTHROSCOPY Right 10/12/2018   right rotator cuff repair    SHOULDER SURGERY Right 10/12/2018   rotator cuff repair    TOTAL KNEE ARTHROPLASTY Left 01/29/2017   Procedure: TOTAL KNEE ARTHROPLASTY;  Surgeon: Vickki Hearing, MD;  Location: [facility];  Service: Orthopedics;  Laterality: Left;   Social History:  reports that she has never smoked. She has never used smokeless tobacco. She reports current alcohol use. She reports that she does not use drugs.  Allergies  Allergen Reactions   Penicillins Other (See Comments)    Yeast infection    Family History  Problem Relation Age of Onset   Alzheimer's disease Mother    Cancer Father        brain tumor   Diabetes Brother    Heart disease Brother    Heart attack Brother    Cancer Brother        melanoma   Lupus Maternal Grandmother    Heart disease Maternal Grandmother    Heart disease Maternal Grandfather    Hypertension Maternal Grandfather    Breast cancer Maternal Aunt  unsure of age    Breast cancer Cousin     Prior to Admission medications   Medication Sig Start Date End Date Taking? Authorizing Provider  Acetaminophen (TYLENOL PO) Take by mouth as needed.    Yes [provider]  albuterol (ACCUNEB) 0.63 MG/3ML nebulizer solution Take 1 ampule by nebulization every 6 (six) hours as needed for wheezing.   Yes [provider]  albuterol (VENTOLIN HFA) 108 (90 Base) MCG/ACT inhaler Inhale 2 puffs into the lungs every 6 (six) hours as needed for wheezing or shortness of breath. 09/28/22  Yes Leath-Warren,  Sadie Haber, NP  budesonide-formoterol (SYMBICORT) 80-4.5 MCG/ACT inhaler Inhale 2 puffs into the lungs 2 (two) times daily.   Yes [provider]  cholecalciferol (VITAMIN D3) 25 MCG (1000 UNIT) tablet Take 1,000 Units by mouth daily.   Yes [provider]  diclofenac (VOLTAREN) 75 MG EC tablet Take 1 tablet (75 mg total) by mouth 2 (two) times daily. 02/03/23  Yes Edward Jolly, MD  levothyroxine (SYNTHROID, LEVOTHROID) 25 MCG tablet Take 25 mcg by mouth daily before breakfast.   Yes [provider]  MAGNESIUM PO Take by mouth daily.   Yes [provider]  olmesartan-hydrochlorothiazide (BENICAR HCT) 40-25 MG tablet Take 1 tablet by mouth daily. 05/10/23  Yes [provider]  ondansetron (ZOFRAN) 4 MG tablet Take 4-8 mg by mouth every 8 (eight) hours as needed for nausea or vomiting. 03/13/23  Yes [provider]  rosuvastatin (CRESTOR) 5 MG tablet Take 5 mg by mouth daily. 03/11/23  Yes [provider]    Physical Exam: Vitals:   06/08/23 1205 06/08/23 1301 06/08/23 1519 06/08/23 1600  BP: (!) 146/70   138/68  Pulse: (!) 104   (!) 102  Resp: 17   18  Temp: 98 F (36.7 C)   98.6 F (37 C)  TempSrc: Oral   Oral  SpO2: 98% 98% 97% 98%  Weight:      Height:       General exam: Alert, awake, oriented x 3; unable to speak in full sentences and demonstrating tachypnea.  2 L nasal cannula in place. Respiratory system: Diffuse expiratory wheezing and positive rhonchi appreciated.  No using accessory muscle. Cardiovascular system: Mild sinus tachycardia, no rubs, no gallops, unable to properly assess JVD with body habitus. Gastrointestinal system: Abdomen is obese, nondistended, soft and nontender. No organomegaly or masses felt. Normal bowel sounds heard. Central nervous system: Alert and oriented. No focal neurological deficits. Extremities: No cyanosis or clubbing. Skin: No petechiae. Psychiatry: Judgement and insight appear  normal. Mood & affect appropriate.    Data Reviewed: CBC: WBCs 9.5, hemoglobin 13.7 and platelet count 174K Troponin: 3 BNP: 61 BMET: Sodium 138, potassium 3.7, chloride 103, bicarb 23, BUN 14, creatinine 0.75 and GFR > 60   Assessment and Plan: Class 2 obesity -Body mass index is 38.97 kg/m. -Low-calorie diet, portion control and increase physical activity discussed with patient.  GERD (gastroesophageal reflux disease) -Continue PPI. -Lifestyle modifications discussed with patient.  HTN (hypertension) -Continue home antihypertensive agents -Heart healthy diet has been ordered -Follow vital signs.  Depression -No suicidal ideation hallucination or hallucinations appreciated. -Stable mood. -Continue home antidepressant regimen (Wellbutrin and Lexapro).  History of hypothyroidism -Continue Synthroid.  Dyslipidemia -Continue statin -Heart healthy diet has been ordered.  COPD (chronic obstructive pulmonary disease) (HCC) -Patient with chronic COPD presenting with acute exacerbation most likely in the setting of bronchitis/bronchiectasis -Admitted to the hospital for IV steroids, nebulizer management,  Mucinex and oxygen supplementation. -Wean off oxygen supplementation as tolerated -Follow clinical response -Due to concern for bronchiectasis will provide treatment with 5 days of doxycycline -Outpatient follow-up with pulmonologist for repeat PFTs and further adjustment to maintenance therapy recommended.  Acute respiratory failure with hypoxia (HCC) -In the setting of COPD exacerbation and bronchiectasis -Treatment will be provided with oxygen supplementation (wean off as tolerated) -IV steroids, mucolytic's, bronchodilators, oral antibiotics and supportive care -Follow clinical response.     Advance Care Planning:   Code Status: Full Code   Consults: None  Family Communication: No family at bedside.  Severity of Illness: The appropriate patient status for this  patient is INPATIENT. Inpatient status is judged to be reasonable and necessary in order to provide the required intensity of service to ensure the patient's safety. The patient's presenting symptoms, physical exam findings, and initial radiographic and laboratory data in the context of their chronic comorbidities is felt to place them at high risk for further clinical deterioration. Furthermore, it is not anticipated that the patient will be medically stable for discharge from the hospital within 2 midnights of admission.   * I certify that at the point of admission it is my clinical judgment that the patient will require inpatient hospital care spanning beyond 2 midnights from the point of admission due to high intensity of service, high risk for further deterioration and high frequency of surveillance required.*  Author: Vassie Loll, MD 06/08/2023 6:44 PM  For on call review [email or web address].

## 2023-06-08 NOTE — Assessment & Plan Note (Signed)
-  Continue PPI -Lifestyle modifications discussed with patient.

## 2023-06-08 NOTE — Assessment & Plan Note (Signed)
-  Patient with chronic COPD presenting with acute exacerbation most likely in the setting of bronchitis/bronchiectasis -Admitted to the hospital for IV steroids, nebulizer management, Mucinex and oxygen supplementation. -Wean off oxygen supplementation as tolerated -Follow clinical response -Due to concern for bronchiectasis will provide treatment with 5 days of doxycycline -Outpatient follow-up with pulmonologist for repeat PFTs and further adjustment to maintenance therapy recommended.

## 2023-06-08 NOTE — ED Triage Notes (Signed)
Pt stated that she has been having SOB since yesterday but was able to control it with at home neb treatments. Pt stated that tonights episode started a few hours ago and nebs have not worked this time. Pt does have COPD and does not wear at home O2. Pt 91 sat on RA.

## 2023-06-08 NOTE — Assessment & Plan Note (Signed)
-  In the setting of COPD exacerbation and bronchiectasis -Treatment will be provided with oxygen supplementation (wean off as tolerated) -IV steroids, mucolytic's, bronchodilators, oral antibiotics and supportive care -Follow clinical response.

## 2023-06-08 NOTE — Assessment & Plan Note (Signed)
-  No suicidal ideation hallucination or hallucinations appreciated. -Stable mood. -Continue home antidepressant regimen (Wellbutrin and Lexapro).

## 2023-06-08 NOTE — Assessment & Plan Note (Signed)
-  Continue home antihypertensive agents -Heart healthy diet has been ordered -Follow vital signs.

## 2023-06-08 NOTE — Assessment & Plan Note (Signed)
-  Continue statin -Heart healthy diet has been ordered.

## 2023-06-08 NOTE — ED Provider Notes (Signed)
[place] EMERGENCY DEPARTMENT AT [facility]  Provider Note  CSN: [id number] Arrival date & time: 06/08/23 0404  History Chief Complaint  Patient presents with   Shortness of Breath    [name] is a 57 y.o. female with history of COPD, but never smoked reports 3 days of increasing wheezing and SOB. Occasional cough. No fever. No CP, but she reports feeling nauseated. She has been using nebs at home without improvement. Noted to have SpO2 91% on arrival, improved with supplemental O2.    Home Medications Prior to Admission medications   Medication Sig Start Date End Date Taking? Authorizing Provider  Acetaminophen (TYLENOL PO) Take by mouth as needed.     [provider]  albuterol (VENTOLIN HFA) 108 (90 Base) MCG/ACT inhaler Inhale 2 puffs into the lungs every 6 (six) hours as needed for wheezing or shortness of breath. 09/28/22   Leath-Warren, Sadie Haber, NP  budesonide-formoterol (SYMBICORT) 80-4.5 MCG/ACT inhaler Inhale 2 puffs into the lungs 2 (two) times daily.    [provider]  buPROPion (WELLBUTRIN XL) 150 MG 24 hr tablet Take 150 mg by mouth daily. 03/12/20   [provider]  diclofenac (VOLTAREN) 75 MG EC tablet Take 1 tablet (75 mg total) by mouth 2 (two) times daily. 02/03/23   Edward Jolly, MD  escitalopram (LEXAPRO) 20 MG tablet Take 25 mg by mouth daily.    [provider]  hydrochlorothiazide (HYDRODIURIL) 25 MG tablet Take 25 mg by mouth daily.     [provider]  levothyroxine (SYNTHROID, LEVOTHROID) 25 MCG tablet Take 25 mcg by mouth daily before breakfast.    [provider]  MAGNESIUM PO Take by mouth daily.    [provider]  olmesartan-hydrochlorothiazide (BENICAR HCT) 40-25 MG tablet Take 1 tablet by mouth daily. 05/10/23   [provider]  Omega-3 Fatty Acids (FISH OIL PO) Take by mouth daily.    [provider]  ondansetron (ZOFRAN) 4 MG tablet Take 4-8 mg by  mouth every 6 (six) hours. 03/13/23   [provider]  rosuvastatin (CRESTOR) 5 MG tablet Take 5 mg by mouth daily. 03/11/23   [provider]     Allergies    Penicillins   Review of Systems   Review of Systems Please see HPI for pertinent positives and negatives  Physical Exam BP (!) 149/73 (BP Location: Right Arm)   Pulse 90   Temp 98.1 F (36.7 C) (Oral)   Resp (!) 22   Ht 5\' 3"  (1.6 m)   Wt 99.8 kg   LMP 04/14/2011   SpO2 91%   BMI 38.97 kg/m   Physical Exam Vitals and nursing note reviewed.  Constitutional:      Appearance: Normal appearance.  HENT:     Head: Normocephalic and atraumatic.     Nose: Nose normal.     Mouth/Throat:     Mouth: Mucous membranes are moist.  Eyes:     Extraocular Movements: Extraocular movements intact.     Conjunctiva/sclera: Conjunctivae normal.  Cardiovascular:     Rate and Rhythm: Normal rate.  Pulmonary:     Effort: Pulmonary effort is normal. No accessory muscle usage.     Breath sounds: Wheezing present.  Abdominal:     General: Abdomen is flat.     Palpations: Abdomen is soft.     Tenderness: There is no abdominal tenderness.  Musculoskeletal:        General: No swelling. Normal range of  motion.     Cervical back: Neck supple.     Right lower leg: No edema.     Left lower leg: No edema.  Skin:    General: Skin is warm and dry.  Neurological:     General: No focal deficit present.     Mental Status: She is alert.  Psychiatric:        Mood and Affect: Mood normal.     ED Results / Procedures / Treatments   EKG EKG Interpretation Date/Time:  Monday June 08 2023 04:20:08 EDT Ventricular Rate:  85 PR Interval:  157 QRS Duration:  91 QT Interval:  414 QTC Calculation: 493 R Axis:   66  Text Interpretation: Sinus rhythm Borderline prolonged QT interval No significant change since last tracing Confirmed by Susy Frizzle [phone]) on 06/08/2023 4:31:49  AM  Procedures Procedures  Medications Ordered in the ED Medications  albuterol (VENTOLIN HFA) 108 (90 Base) MCG/ACT inhaler 2 puff (2 puffs Inhalation Given 06/08/23 0455)  ondansetron (ZOFRAN) injection 4 mg (4 mg Intravenous Given 06/08/23 0453)  methylPREDNISolone sodium succinate (SOLU-MEDROL) 125 mg/2 mL injection 125 mg (125 mg Intravenous Given 06/08/23 0452)  ipratropium-albuterol (DUONEB) 0.5-2.5 (3) MG/3ML nebulizer solution 3 mL (3 mLs Nebulization Given 06/08/23 0544)  albuterol (PROVENTIL) (2.5 MG/3ML) 0.083% nebulizer solution (2.5 mg  Given 06/08/23 0545)  albuterol (PROVENTIL) (2.5 MG/3ML) 0.083% nebulizer solution (5 mg  Given 06/08/23 0553)    Initial Impression and Plan  Patient here with SOB and wheezing. Likely COPD exacerbation, will check labs and CXR to eval other causes. Neb, steroids and zofran for nausea.   ED Course   Clinical Course as of 06/08/23 0635  Mon Jun 08, 2023  0454 CBC is normal.  [CS]  [phone] I personally viewed the images from radiology studies and agree with radiologist interpretation: CXR is clear [CS]  0515 BMP and Trop are neg. Covid/Flu/RSV swab is negative.  [CS]  0528 BNP is negative. [CS]  0531 Patient still wheezing some but SpO2 improved. Will turn off oxygen. Give a second neb and reassess.  [CS]  [id number] Patient as low as 90% on RA prior to repeat nebs.  [CS]  [phone] Patient still wheezing and more SOB walking a short distance to the bathroom. She is reluctant, but ultimately willing to be admitted for further management.  [CS]  [phone] Spoke with Dr. Carren Rang, hospitalist, who will accept for admission.  [CS]    Clinical Course User Index [CS] Pollyann Savoy, MD     MDM Rules/Calculators/A&P Medical Decision Making Problems Addressed: COPD exacerbation [facility]): acute illness or injury  Amount and/or Complexity of Data Reviewed Labs: ordered. Decision-making details documented in ED Course. Radiology: ordered and independent  interpretation performed. Decision-making details documented in ED Course. ECG/medicine tests: ordered and independent interpretation performed. Decision-making details documented in ED Course.  Risk Prescription drug management. Decision regarding hospitalization.     Final Clinical Impression(s) / ED Diagnoses Final diagnoses:  COPD exacerbation [facility])    Rx / DC Orders ED Discharge Orders     None        Pollyann Savoy, MD 06/08/23 [phone]

## 2023-06-08 NOTE — Assessment & Plan Note (Signed)
-  Body mass index is 38.97 kg/m. -Low-calorie diet, portion control and increase physical activity discussed with patient.

## 2023-06-08 NOTE — Assessment & Plan Note (Signed)
Continue Synthroid °

## 2023-06-08 NOTE — ED Notes (Signed)
Patient transported to X-ray 

## 2023-06-09 DIAGNOSIS — J441 Chronic obstructive pulmonary disease with (acute) exacerbation: Secondary | ICD-10-CM | POA: Diagnosis not present

## 2023-06-09 DIAGNOSIS — K219 Gastro-esophageal reflux disease without esophagitis: Secondary | ICD-10-CM | POA: Diagnosis not present

## 2023-06-09 DIAGNOSIS — J9601 Acute respiratory failure with hypoxia: Secondary | ICD-10-CM | POA: Diagnosis not present

## 2023-06-09 DIAGNOSIS — I10 Essential (primary) hypertension: Secondary | ICD-10-CM | POA: Diagnosis not present

## 2023-06-09 DIAGNOSIS — E785 Hyperlipidemia, unspecified: Secondary | ICD-10-CM | POA: Diagnosis not present

## 2023-06-09 DIAGNOSIS — Z8639 Personal history of other endocrine, nutritional and metabolic disease: Secondary | ICD-10-CM | POA: Diagnosis not present

## 2023-06-09 DIAGNOSIS — E669 Obesity, unspecified: Secondary | ICD-10-CM | POA: Diagnosis not present

## 2023-06-09 DIAGNOSIS — F32A Depression, unspecified: Secondary | ICD-10-CM | POA: Diagnosis not present

## 2023-06-09 LAB — BASIC METABOLIC PANEL
Anion gap: 9 (ref 5–15)
BUN: 13 mg/dL (ref 6–20)
CO2: 23 mmol/L (ref 22–32)
Calcium: 8.6 mg/dL — ABNORMAL LOW (ref 8.9–10.3)
Chloride: 104 mmol/L (ref 98–111)
Creatinine, Ser: 0.66 mg/dL (ref 0.44–1.00)
GFR, Estimated: >60 mL/min (ref >60)
Glucose, Bld: 182 mg/dL — ABNORMAL HIGH (ref 70–99)
Potassium: 4 mmol/L (ref 3.5–5.1)
Sodium: 136 mmol/L (ref 135–145)

## 2023-06-09 LAB — CBC
HCT: 36.2 % (ref 36.0–46.0)
Hemoglobin: 11.9 g/dL — ABNORMAL LOW (ref 12.0–15.0)
MCH: 29.1 pg (ref 26.0–34.0)
MCHC: 32.9 g/dL (ref 30.0–36.0)
MCV: 88.5 fL (ref 80.0–100.0)
Platelets: 202 10*3/uL (ref 150–400)
RBC: 4.09 MIL/uL (ref 3.87–5.11)
RDW: 13.5 % (ref 11.5–15.5)
WBC: 16.1 10*3/uL — ABNORMAL HIGH (ref 4.0–10.5)
nRBC: 0 % (ref 0.0–0.2)

## 2023-06-09 LAB — HIV ANTIBODY (ROUTINE TESTING W REFLEX): HIV Screen 4th Generation wRfx: NONREACTIVE

## 2023-06-09 MED ORDER — BUDESONIDE-FORMOTEROL FUMARATE 80-4.5 MCG/ACT IN AERO: 2.0000 | INHALATION_SPRAY | Freq: Two times a day (BID) | RESPIRATORY_TRACT | 4 refills | Status: DC

## 2023-06-09 MED ORDER — PANTOPRAZOLE SODIUM 40 MG PO TBEC: 40.0000 mg | DELAYED_RELEASE_TABLET | Freq: Every day | ORAL | 0 refills | Status: DC

## 2023-06-09 MED ORDER — IPRATROPIUM-ALBUTEROL 0.5-2.5 (3) MG/3ML IN SOLN
3.0000 mL | Freq: Four times a day (QID) | RESPIRATORY_TRACT | Status: DC
  Administered 2023-06-09: 3 mL via RESPIRATORY_TRACT

## 2023-06-09 MED ORDER — PREDNISONE 20 MG PO TABS: ORAL_TABLET | ORAL | 0 refills | Status: DC

## 2023-06-09 MED ORDER — DOXYCYCLINE HYCLATE 100 MG PO TABS: 100.0000 mg | ORAL_TABLET | Freq: Two times a day (BID) | ORAL | 0 refills | Status: AC

## 2023-06-09 MED ORDER — DM-GUAIFENESIN ER 30-600 MG PO TB12: 1.0000 | ORAL_TABLET | Freq: Two times a day (BID) | ORAL | 0 refills | Status: DC

## 2023-06-09 NOTE — Progress Notes (Signed)
   06/09/23 1020  TOC Brief Assessment  Insurance and Status Reviewed  Patient has primary care physician Yes  Home environment has been reviewed from home  Prior level of function: independent  Prior/Current Home Services No current home services  Social Determinants of Health Reivew SDOH reviewed no interventions necessary  Readmission risk has been reviewed Yes  Transition of care needs no transition of care needs at this time    Transition of Care Department [facility]) has reviewed patient and no TOC needs have been identified at this time. We will continue to monitor patient advancement through interdisciplinary progression rounds. If new patient transition needs arise, please place a TOC consult.

## 2023-06-09 NOTE — Plan of Care (Signed)
  Problem: Clinical Measurements: Goal: Respiratory complications will improve Outcome: Progressing   Problem: Clinical Measurements: Goal: Will remain free from infection Outcome: Progressing   Problem: Education: Goal: Knowledge of General Education information will improve Description: Including pain rating scale, medication(s)/side effects and non-pharmacologic comfort measures Outcome: Progressing

## 2023-06-09 NOTE — Progress Notes (Signed)
Discharge instructions given and patient verbalized understanding.  Discharged to home per private vehicle in stable condition.

## 2023-06-09 NOTE — Discharge Summary (Signed)
Physician Discharge Summary   Patient: [name] MRN: [id number] DOB: Aug 30, 1966  Admit date:     06/08/2023  Discharge date: 06/09/23  Discharge Physician: Vassie Loll   PCP: Assunta Found, MD   Recommendations at discharge:  Outpatient follow-up with pulmonologist for PFTs and further adjustment maintenance treatment of COPD. Continue assisting patient with weight loss management Reassess blood pressure and adjust antihypertensive regimen as required.  Discharge Diagnoses: Principal Problem:   Acute respiratory failure with hypoxia (HCC) Active Problems:   COPD exacerbation (HCC)   Class 2 obesity   History of hypothyroidism   Dyslipidemia   GERD (gastroesophageal reflux disease)   HTN (hypertension)   Depression  Brief Hospital admission narrative: [name] is a 57 y.o. female with medical history significant of COPD, hypothyroidism, hypertension, gastroesophageal reflux disease and class II obesity; presenting to the hospital secondary to shortness of breath, intermittent coughing spells and increased wheezing.  Patient reports symptom has been present for the last 3 days or so and worsening.  At home she has inhaler and nebulizer machine and despite provided treatment symptoms has failed to improve. No fever, no chest pain, no nausea, no vomiting, no sick contacts.   In the ED chest x-ray demonstrating bronchitic changes, diffuse expiratory wheezing on exam and the presence of hypoxia especially on exertion.  2 L nasal cannula supplementation in place at time of my evaluation.   COVID PCR negative.  Assessment and Plan: 1-acute respiratory failure with hypoxia secondary to COPD exacerbation and bronchiectasis. -Patient treated with steroids, antibiotics, bronchodilator management, mucolytic's and oxygen supplementation. -Excellent response to treatment and at time of discharge no oxygen supplementation was required -Patient discharged on steroids  tapering, 4 more days of doxycycline twice a day, resumption of her as needed albuterol and Symbicort and instruction to follow-up with PCP and pulmonologist as an outpatient.  2-hypertension -Continue home antihypertensive agents -Heart healthy diet discussed with patient.  3-history of hypothyroidism -Continue Synthroid.  4-depression: -No suicidal ideation or hallucinations -Stable mood. -Continue home Wellbutrin and Lexapro.  5-GERD -Continue PPI. -Lifestyle modifications discussed with patient.  6-class II obesity -Body mass index is 38.97 kg/m. -Low-calorie diet, portion control and increase physical activity discussed with patient.  7-dyslipidemia -Continue statin.  Consultants: None Procedures performed: See below for x-ray reports Disposition: Home Diet recommendation: Heart healthy/low calorie diet.  DISCHARGE MEDICATION: Allergies as of 06/09/2023       Reactions   Penicillins Other (See Comments)   Yeast infection        Medication List     TAKE these medications    albuterol 0.63 MG/3ML nebulizer solution Commonly known as: ACCUNEB Take 1 ampule by nebulization every 6 (six) hours as needed for wheezing.   albuterol 108 (90 Base) MCG/ACT inhaler Commonly known as: VENTOLIN HFA Inhale 2 puffs into the lungs every 6 (six) hours as needed for wheezing or shortness of breath.   budesonide-formoterol 80-4.5 MCG/ACT inhaler Commonly known as: SYMBICORT Inhale 2 puffs into the lungs 2 (two) times daily. What changed: when to take this   cholecalciferol 25 MCG (1000 UNIT) tablet Commonly known as: VITAMIN D3 Take 1,000 Units by mouth daily.   dextromethorphan-guaiFENesin 30-600 MG 12hr tablet Commonly known as: MUCINEX DM Take 1 tablet by mouth 2 (two) times daily.   diclofenac 75 MG EC tablet Commonly known as: VOLTAREN Take 1 tablet (75 mg total) by mouth 2 (two) times daily.   doxycycline 100 MG tablet Commonly known as: VIBRA-TABS Take  1 tablet (100 mg total) by mouth every 12 (twelve) hours for 4 days.   levothyroxine 25 MCG tablet Commonly known as: SYNTHROID Take 25 mcg by mouth daily before breakfast.   MAGNESIUM PO Take by mouth daily.   olmesartan-hydrochlorothiazide 40-25 MG tablet Commonly known as: BENICAR HCT Take 1 tablet by mouth daily.   ondansetron 4 MG tablet Commonly known as: ZOFRAN Take 4-8 mg by mouth every 8 (eight) hours as needed for nausea or vomiting.   pantoprazole 40 MG tablet Commonly known as: PROTONIX Take 1 tablet (40 mg total) by mouth daily. Start taking on: June 10, 2023   predniSONE 20 MG tablet Commonly known as: DELTASONE Take 3 tablets by mouth daily x 1 day; then 2 tablets by mouth daily x 2 days; then 1 tablet by mouth daily x 3 days; then half tablet by mouth daily x 3 days and stop prednisone.   rosuvastatin 5 MG tablet Commonly known as: CRESTOR Take 5 mg by mouth daily.   TYLENOL PO Take by mouth as needed.        Follow-up Information     Assunta Found, MD. Schedule an appointment as soon as possible for a visit in 10 day(s).   Specialty: Family Medicine Contact information: [address] [place] [address] [phone]         Nyoka Cowden, MD. Schedule an appointment as soon as possible for a visit in 4 week(s).   Specialty: Pulmonary Disease Contact information: [address] [place] [address] [phone]                Discharge Exam: Filed Weights   06/08/23 0418  Weight: 99.8 kg   General exam: Alert, awake, oriented x 3, no chest pain, no nausea, no vomiting.  Good saturation on room air.  Patient speaking in full sentences and ready for discharge. Respiratory system: Improved air movement bilaterally; no using accessory muscle.  Good saturation on room air.  Mild expiratory wheezing appreciated on exam.  No crackles. Cardiovascular system:RRR. No rubs or gallops. Gastrointestinal system: Abdomen is  obese, nondistended, soft and nontender. No organomegaly or masses felt. Normal bowel sounds heard. Central nervous system: No focal neurological deficits. Extremities: No cyanosis or clubbing. Skin: No petechiae. Psychiatry: Judgement and insight appear normal. Mood & affect appropriate.    Condition at discharge: Stable and improved.  The results of significant diagnostics from this hospitalization (including imaging, microbiology, ancillary and laboratory) are listed below for reference.   Imaging Studies: DG Chest 2 View  Result Date: 06/08/2023 CLINICAL DATA:  Shortness of breath, wheezing. EXAM: CHEST - 2 VIEW COMPARISON:  None Available. FINDINGS: The heart size and mediastinal contours are within normal limits. No consolidation, effusion, or pneumothorax. No acute osseous abnormality. IMPRESSION: No active cardiopulmonary disease. Electronically Signed   By: Thornell Sartorius M.D.   On: 06/08/2023 04:51   DG PAIN CLINIC C-ARM 1-60 MIN NO REPORT  Result Date: 06/03/2023 Fluoro was used, but no Radiologist interpretation will be provided. Please refer to "NOTES" tab for provider progress note.   Microbiology: Results for orders placed or performed during the hospital encounter of 06/08/23  Resp panel by RT-PCR (RSV, Flu A&B, Covid) Anterior Nasal Swab     Status: None   Collection Time: 06/08/23  4:33 AM   Specimen: Anterior Nasal Swab  Result Value Ref Range Status   SARS Coronavirus 2 by RT PCR NEGATIVE NEGATIVE Final    Comment: (NOTE) SARS-CoV-2 target nucleic  acids are NOT DETECTED.  The SARS-CoV-2 RNA is generally detectable in upper respiratory specimens during the acute phase of infection. The lowest concentration of SARS-CoV-2 viral copies this assay can detect is 138 copies/mL. A negative result does not preclude SARS-Cov-2 infection and should not be used as the sole basis for treatment or other patient management decisions. A negative result may occur with  improper  specimen collection/handling, submission of specimen other than nasopharyngeal swab, presence of viral mutation(s) within the areas targeted by this assay, and inadequate number of viral copies(<138 copies/mL). A negative result must be combined with clinical observations, patient history, and epidemiological information. The expected result is Negative.  Fact Sheet for Patients:  BloggerCourse.com  Fact Sheet for Healthcare Providers:  SeriousBroker.it  This test is no t yet approved or cleared by the [place] FDA and  has been authorized for detection and/or diagnosis of SARS-CoV-2 by FDA under an Emergency Use Authorization (EUA). This EUA will remain  in effect (meaning this test can be used) for the duration of the COVID-19 declaration under Section 564(b)(1) of the Act, 21 U.S.C.section 360bbb-3(b)(1), unless the authorization is terminated  or revoked sooner.       Influenza A by PCR NEGATIVE NEGATIVE Final   Influenza B by PCR NEGATIVE NEGATIVE Final    Comment: (NOTE) The Xpert Xpress SARS-CoV-2/FLU/RSV plus assay is intended as an aid in the diagnosis of influenza from Nasopharyngeal swab specimens and should not be used as a sole basis for treatment. Nasal washings and aspirates are unacceptable for Xpert Xpress SARS-CoV-2/FLU/RSV testing.  Fact Sheet for Patients: BloggerCourse.com  Fact Sheet for Healthcare Providers: SeriousBroker.it  This test is not yet approved or cleared by the [place] FDA and has been authorized for detection and/or diagnosis of SARS-CoV-2 by FDA under an Emergency Use Authorization (EUA). This EUA will remain in effect (meaning this test can be used) for the duration of the COVID-19 declaration under Section 564(b)(1) of the Act, 21 U.S.C. section 360bbb-3(b)(1), unless the authorization is terminated or revoked.     Resp  Syncytial Virus by PCR NEGATIVE NEGATIVE Final    Comment: (NOTE) Fact Sheet for Patients: BloggerCourse.com  Fact Sheet for Healthcare Providers: SeriousBroker.it  This test is not yet approved or cleared by the [place] FDA and has been authorized for detection and/or diagnosis of SARS-CoV-2 by FDA under an Emergency Use Authorization (EUA). This EUA will remain in effect (meaning this test can be used) for the duration of the COVID-19 declaration under Section 564(b)(1) of the Act, 21 U.S.C. section 360bbb-3(b)(1), unless the authorization is terminated or revoked.  Performed at [facility], [address], [place], [place] [address]     Labs: CBC: Recent Labs  Lab 06/08/23 0430 06/09/23 0415  WBC 9.5 16.1*  NEUTROABS 6.0  --   HGB 13.7 11.9*  HCT 41.2 36.2  MCV 87.8 88.5  PLT 174 202   Basic Metabolic Panel: Recent Labs  Lab 06/08/23 0430 06/09/23 0415  NA 138 136  K 3.7 4.0  CL 103 104  CO2 23 23  GLUCOSE 144* 182*  BUN 14 13  CREATININE 0.75 0.66  CALCIUM 8.3* 8.6*   Discharge time spent: greater than 30 minutes.  Signed: Vassie Loll, MD [facility] 06/09/2023

## 2023-06-11 DIAGNOSIS — E6609 Other obesity due to excess calories: Secondary | ICD-10-CM | POA: Diagnosis not present

## 2023-06-11 DIAGNOSIS — Z6837 Body mass index (BMI) 37.0-37.9, adult: Secondary | ICD-10-CM | POA: Diagnosis not present

## 2023-06-11 DIAGNOSIS — J441 Chronic obstructive pulmonary disease with (acute) exacerbation: Secondary | ICD-10-CM | POA: Diagnosis not present

## 2023-06-30 ENCOUNTER — Telehealth: Payer: Self-pay

## 2023-06-30 NOTE — Telephone Encounter (Signed)
LM for patient to cal office for pre virtual appointment questions.

## 2023-07-01 ENCOUNTER — Telehealth: Payer: Self-pay

## 2023-07-01 ENCOUNTER — Ambulatory Visit
Payer: 59 | Attending: Student in an Organized Health Care Education/Training Program | Admitting: Student in an Organized Health Care Education/Training Program

## 2023-07-01 DIAGNOSIS — M47816 Spondylosis without myelopathy or radiculopathy, lumbar region: Secondary | ICD-10-CM

## 2023-07-01 NOTE — Progress Notes (Signed)
I attempted to call the patient however no response. Voicemail left instructing patient to call front desk office at [phone] to reschedule appointment. -Dr Cherylann Ratel    HPI  Today, she is being contacted for a post-procedure assessment.   Post-procedure evaluation   Procedure: Lumbar Facet, Medial Branch Block(s) #2  Laterality: Bilateral  Level: L3, L4, and L5 Medial Branch Level(s). Injecting these levels blocks the L3-4 and L4-5 lumbar facet joints. Imaging: Fluoroscopic guidance  Anesthesia: Local anesthesia (1-2% Lidocaine) Anxiolysis: IV Versed         DOS: 06/03/2023 Performed by: Edward Jolly, MD  Primary Purpose: Diagnostic/Therapeutic Indications: Low back pain severe enough to impact quality of life or function. 1. Lumbar facet arthropathy   2. Lumbar spondylosis   3. Chronic pain syndrome    NAS-11 Pain score:   Pre-procedure: 7 /10   Post-procedure: 3 /10      Effectiveness:  Initial hour after procedure: 100 %  Subsequent 4-6 hours post-procedure: 100 %  Analgesia past initial 6 hours: 100 % (pain returned around 2 weeks later, then patient was hospitalized for several day.  Back at present continues to have pain and she is taking tylenol for that.)  Ongoing improvement:  Analgesic:

## 2023-07-01 NOTE — Telephone Encounter (Signed)
I attempted again to call patient for pre virtual appt questions.

## 2023-07-06 NOTE — Progress Notes (Deleted)
   [name], female    DOB: Mar 26, 1966    MRN: [id number]   Brief patient profile:  57  yo*** *** referred to pulmonary clinic in [place]  07/07/2023 by *** for ***      History of Present Illness  07/07/2023  Pulmonary/ 1st office eval/ Sherene Sires / [place] [facility]  No chief complaint on file.    Dyspnea:  *** Cough: *** Sleep: *** SABA use: *** 02: *** Lung cancer screen: ***   Outpatient Medications Prior to Visit  Medication Sig Dispense Refill   Acetaminophen (TYLENOL PO) Take by mouth as needed.      albuterol (ACCUNEB) 0.63 MG/3ML nebulizer solution Take 1 ampule by nebulization every 6 (six) hours as needed for wheezing.     albuterol (VENTOLIN HFA) 108 (90 Base) MCG/ACT inhaler Inhale 2 puffs into the lungs every 6 (six) hours as needed for wheezing or shortness of breath. 8 g 0   budesonide-formoterol (SYMBICORT) 80-4.5 MCG/ACT inhaler Inhale 2 puffs into the lungs 2 (two) times daily. 1 each 4   cholecalciferol (VITAMIN D3) 25 MCG (1000 UNIT) tablet Take 1,000 Units by mouth daily.     dextromethorphan-guaiFENesin (MUCINEX DM) 30-600 MG 12hr tablet Take 1 tablet by mouth 2 (two) times daily. 20 tablet 0   diclofenac (VOLTAREN) 75 MG EC tablet Take 1 tablet (75 mg total) by mouth 2 (two) times daily. 60 tablet 2   levothyroxine (SYNTHROID, LEVOTHROID) 25 MCG tablet Take 25 mcg by mouth daily before breakfast.     MAGNESIUM PO Take by mouth daily.     olmesartan-hydrochlorothiazide (BENICAR HCT) 40-25 MG tablet Take 1 tablet by mouth daily.     ondansetron (ZOFRAN) 4 MG tablet Take 4-8 mg by mouth every 8 (eight) hours as needed for nausea or vomiting.     pantoprazole (PROTONIX) 40 MG tablet Take 1 tablet (40 mg total) by mouth daily. 30 tablet 0   predniSONE (DELTASONE) 20 MG tablet Take 3 tablets by mouth daily x 1 day; then 2 tablets by mouth daily x 2 days; then 1 tablet by mouth daily x 3 days; then half tablet by mouth daily x 3 days and stop prednisone.  12 tablet 0   rosuvastatin (CRESTOR) 5 MG tablet Take 5 mg by mouth daily.     No facility-administered medications prior to visit.    Past Medical History:  Diagnosis Date   Arthritis    Back pain 10/30/2015   Bilateral shoulder pain 10/30/2015   COPD (chronic obstructive pulmonary disease) (HCC)    Fecal occult blood test positive 10/30/2015   Hemorrhoids 10/30/2015   Hypertension    Hypothyroidism    Large breasts 10/30/2015   43 DD   Postmenopausal 10/30/2015   Sleep apnea    pt DX but did not want to use CPAP.   Thyroid disease       Objective:     LMP 04/14/2011          Assessment   No problem-specific Assessment & Plan notes found for this encounter.     Sandrea Hughs, MD 07/06/2023

## 2023-07-07 ENCOUNTER — Institutional Professional Consult (permissible substitution): Payer: 59 | Admitting: Internal Medicine

## 2023-07-07 ENCOUNTER — Encounter: Payer: Self-pay | Admitting: Internal Medicine

## 2023-07-20 ENCOUNTER — Ambulatory Visit: Payer: 59 | Admitting: Orthopedic Surgery

## 2023-07-20 ENCOUNTER — Other Ambulatory Visit (INDEPENDENT_AMBULATORY_CARE_PROVIDER_SITE_OTHER): Payer: Self-pay

## 2023-07-20 ENCOUNTER — Encounter: Payer: Self-pay | Admitting: Orthopedic Surgery

## 2023-07-20 VITALS — Ht 63.0 in | Wt 225.0 lb

## 2023-07-20 DIAGNOSIS — Z96652 Presence of left artificial knee joint: Secondary | ICD-10-CM

## 2023-07-20 DIAGNOSIS — M25562 Pain in left knee: Secondary | ICD-10-CM | POA: Diagnosis not present

## 2023-07-20 NOTE — Progress Notes (Signed)
Chief Complaint  Patient presents with   Knee Pain    Left    Body mass index is 39.86 kg/m.  Assessment and plan acute knee pain status post total knee most likely scar tissue no instability in the knee no abnormal findings in terms of pathology in the knee x-ray negative  Recommend rest and ice should resolve over 2 to 4 weeks  Encounter Diagnosis  Name Primary?   Acute pain of left knee Yes     History popped in bed while straightening knee last weds   Left knee pain while straightening her knee in bed  Patient had left total knee years ago was doing well last Wednesday presents with pain along the anterolateral side of the knee   she has been able to walk on it  It seems to be getting better  Physical Exam Nursing note reviewed.  Musculoskeletal:       Legs:     Comments: Incision is healed around the incision.  She does have tenderness on the anterolateral aspect of the knee but the knee comes to full extension with good strength flexion arc to the 120 degrees stability test in extension and flexion are normal  Skin:    General: Skin is warm and dry.     Capillary Refill: Capillary refill takes less than 2 seconds.  Neurological:     General: No focal deficit present.     Mental Status: She is alert.     Gait: Gait abnormal.  Psychiatric:        Mood and Affect: Mood normal.        Behavior: Behavior normal.        Thought Content: Thought content normal.        Judgment: Judgment normal.

## 2023-08-05 NOTE — Progress Notes (Unsigned)
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 Continue assisting patient with weight loss management Reassess blood pressure and adjust antihypertensive regimen as required.   Discharge Diagnoses: Principal Problem:   Acute respiratory failure with hypoxia (HCC)   COPD exacerbation (HCC)   Class 2 obesity   History of hypothyroidism   Dyslipidemia   GERD (gastroesophageal reflux disease)   HTN (hypertension)   Depression   57 y.o. female with medical history significant of COPD, hypothyroidism, hypertension, gastroesophageal reflux disease and class II obesity; presenting to the hospital secondary to shortness of breath, intermittent coughing spells and increased wheezing.  Patient reports symptom has been present for the last 3 days or so and worsening.  At home she has inhaler and nebulizer machine and despite provided treatment symptoms has failed to improve. No fever, no chest pain, no nausea, no vomiting, no sick contacts.   In the ED chest x-ray demonstrating bronchitic changes, diffuse expiratory wheezing on exam and the presence of hypoxia especially on exertion.  2 L nasal cannula supplementation in place at time of my evaluation.   COVID PCR negative.   Assessment and Plan: 1-acute respiratory failure with hypoxia secondary to COPD exacerbation and bronchiectasis. -Patient treated with steroids, antibiotics, bronchodilator management, mucolytic's and oxygen supplementation. -Excellent  response to treatment and at time of discharge no oxygen supplementation was required -Patient discharged on steroids tapering, 4 more days of doxycycline twice a day, resumption of her as needed albuterol and Symbicort and instruction to follow-up with PCP and pulmonologist as an outpatient.   2-hypertension -Continue home antihypertensive agents -Heart healthy diet discussed with patient.   3-history of hypothyroidism -Continue Synthroid.   4-depression: -No suicidal ideation or hallucinations -Stable mood. -Continue home Wellbutrin and Lexapro.   5-GERD -Continue PPI. -Lifestyle modifications discussed with patient.   6-class II obesity -Body mass index is 38.97 kg/m. -Low-calorie diet, portion control and increase physical activity discussed with patient.   7-dyslipidemia -Continue statin.     Sinus surgery in [place]   2000 reduced need for nasal spray    History of Present Illness  08/06/2023  Pulmonary/ 1st office eval/ [name] / [facility] / no pfts done yet / symbicort 80  Chief Complaint  Patient presents with   Consult  Dyspnea:  good days 0.10 miles up to mailbox and slt down hill to house  Cough: dry hack sporadic / worse with laughing  Sleep: bed is flat / 2 pillow  SABA use: couple of times a day  02: none   No obvious day to day or daytime pattern/variability or assoc excess/ purulent sputum or mucus plugs or hemoptysis or cp or chest tightness, subjective wheeze or overt sinus or hb symptoms.    Also denies any obvious fluctuation of symptoms with weather or environmental changes or other aggravating or alleviating factors except as outlined above   No unusual exposure hx or h/o childhood pna/ asthma or knowledge  of premature birth.  Current Allergies, Complete Past Medical History, Past Surgical History, Family History, and Social History were reviewed in [employer] record.  ROS  The following are not active complaints  unless bolded Hoarseness, sore throat, dysphagia, dental problems, itching, sneezing,  nasal congestion or discharge of excess mucus or purulent secretions, ear ache,   fever, chills, sweats, unintended wt loss or wt gain, classically pleuritic or exertional cp,  orthopnea pnd or arm/hand swelling  or leg swelling, presyncope, palpitations, abdominal pain, anorexia, nausea, vomiting, diarrhea  or change in bowel habits or change in bladder habits, change in stools or change in urine, dysuria, hematuria,  rash, arthralgias, visual complaints, headache, numbness, weakness or ataxia or problems with walking or coordination,  change in mood or  memory.              Outpatient Medications Prior to Visit  Medication Sig Dispense Refill   Acetaminophen (TYLENOL PO) Take by mouth as needed.      albuterol (ACCUNEB) 0.63 MG/3ML nebulizer solution Take 1 ampule by nebulization every 6 (six) hours as needed for wheezing.     albuterol (VENTOLIN HFA) 108 (90 Base) MCG/ACT inhaler Inhale 2 puffs into the lungs every 6 (six) hours as needed for wheezing or shortness of breath. 8 g 0   budesonide-formoterol (SYMBICORT) 80-4.5 MCG/ACT inhaler Inhale 2 puffs into the lungs 2 (two) times daily. 1 each 4   cholecalciferol (VITAMIN D3) 25 MCG (1000 UNIT) tablet Take 1,000 Units by mouth daily.     diclofenac (VOLTAREN) 75 MG EC tablet Take 1 tablet (75 mg total) by mouth 2 (two) times daily. 60 tablet 2   levothyroxine (SYNTHROID, LEVOTHROID) 25 MCG tablet Take 25 mcg by mouth daily before breakfast.     MAGNESIUM PO Take by mouth daily.     olmesartan-hydrochlorothiazide (BENICAR HCT) 40-25 MG tablet Take 1 tablet by mouth daily.     ondansetron (ZOFRAN) 4 MG tablet Take 4-8 mg by mouth every 8 (eight) hours as needed for nausea or vomiting.     pantoprazole (PROTONIX) 40 MG tablet Take 1 tablet (40 mg total) by mouth daily. 30 tablet 0   rosuvastatin (CRESTOR) 5 MG tablet Take 5 mg by mouth daily.      dextromethorphan-guaiFENesin (MUCINEX DM) 30-600 MG 12hr tablet Take 1 tablet by mouth 2 (two) times daily. (Patient not taking: Reported on 08/06/2023) 20 tablet 0   No facility-administered medications prior to visit.    Past Medical History:  Diagnosis Date   Arthritis    Back pain 10/30/2015   Bilateral shoulder pain 10/30/2015   COPD (chronic obstructive pulmonary disease) (HCC)    Fecal occult blood test positive 10/30/2015   Hemorrhoids 10/30/2015   Hypertension    Hypothyroidism    Large breasts 10/30/2015   43 DD   Postmenopausal 10/30/2015   Sleep apnea    pt DX but did not want to use CPAP.   Thyroid disease       Objective:     BP 114/74   Pulse 72   Ht 5\' 3"  (1.6 m)   Wt 223 lb (101.2 kg)   LMP 04/14/2011   SpO2 98% Comment: ra  BMI 39.50 kg/m   SpO2: 98 % (ra) amb somber mod  obese wf freq throat clearing   Pseudowheeze only / 1+ pitting   HEENT : Oropharynx  clear      Nasal turbinates nl    NECK :  without  apparent JVD/ palpable Nodes/TM    LUNGS: no acc muscle use,  Nl contour chest which is clear to A and P bilaterally without cough on insp or exp maneuvers   CV:  RRR  no s3 or murmur or increase in P2, and trace pitting both LE  edema   ABD: quite obese  soft and nontender    MS:  Nl gait/ ext warm without deformities Or obvious joint restrictions  calf tenderness, cyanosis or clubbing    SKIN: warm and dry without lesions    NEURO:  alert, approp, nl sensorium with  no motor or cerebellar deficits apparent.    I personally reviewed images and agree with radiology impression as follows:  CXR:   pa and lat  06/08/23  No active cardiopulmonary disease.      Assessment   DOE (dyspnea on exertion) Never smoker with prior dx of copd - 06/08/23  EOS  0. - 08/06/2023   Walked on RA  x  3  lap(s) =  approx 450  ft  pt started at slow pace them when moderate pace that is her normal pace for her ,she stated that she felt winded after she finsihed and  had hip pain, notice that patient abnormal gait on patient pt was walking. pt has a slight limp with sats never < 94%  - 08/06/2023 max rx for gerd - 08/06/2023  After extensive coaching inhaler device,  effectiveness =    75% > continue symbicort 80 2bid pending pfts given elevated EOS at baseline (likely mild element of AB)   This is clearly copd and no CT evidence of bronchiectasis either.  Main finding on exam = obesity and pseudowheeze / upper airway cough > rx for gerd and AB and f/u with pfts when available.  Discussed in detail all the  indications, usual  risks and alternatives  relative to the benefits with patient who agrees to proceed with Rx as outlined.      Each maintenance medication was reviewed in detail including emphasizing most importantly the difference between maintenance and prns and under what circumstances the prns are to be triggered using an action plan format where appropriate.  Total time for H and P, chart review, counseling, reviewing hfa device(s) , directly observing portions of ambulatory 02 saturation study/ and generating customized AVS unique to this office visit / same day charting = 60 min new pt eval with multiple resp cc of ? Etiology                   Sandrea Hughs, MD 08/06/2023

## 2023-08-06 ENCOUNTER — Ambulatory Visit (INDEPENDENT_AMBULATORY_CARE_PROVIDER_SITE_OTHER): Payer: 59 | Admitting: Internal Medicine

## 2023-08-06 ENCOUNTER — Encounter: Payer: Self-pay | Admitting: Internal Medicine

## 2023-08-06 VITALS — BP 114/74 | HR 72 | Ht 63.0 in | Wt 223.0 lb

## 2023-08-06 DIAGNOSIS — R0609 Other forms of dyspnea: Secondary | ICD-10-CM | POA: Diagnosis not present

## 2023-08-06 MED ORDER — FAMOTIDINE 20 MG PO TABS: ORAL_TABLET | ORAL | 11 refills | Status: DC

## 2023-08-06 NOTE — Patient Instructions (Addendum)
Plan A = Automatic = Always=   Symbicort 80 Take 2 puffs first thing in am and then another 2 puffs about 12 hours later (add the pm doses)   Work on inhaler technique:  relax and gently blow all the way out then take a nice smooth full deep breath back in, triggering the inhaler at same time you start breathing in.  Hold breath in for at least  5 seconds if you can. Blow out Symbicort out  thru nose. Rinse and gargle with water when done.  If mouth or throat bother you at all,  try brushing teeth/gums/tongue with arm and hammer toothpaste/ make a slurry and gargle and spit out.   Pantoprazole (protonix) 40 mg   Take  30-60 min before first meal of the day and Pepcid (famotidine)  20 mg after supper until return to office - this is the best way to tell whether stomach acid is contributing to your problem.      GERD (REFLUX)  is an extremely common cause of respiratory symptoms just like yours , many times with no obvious heartburn at all.    It can be treated with medication, but also with lifestyle changes including elevation of the head of your bed (ideally with 6 -8inch blocks under the headboard of your bed),  Smoking cessation, avoidance of late meals, excessive alcohol, and avoid fatty foods, chocolate, peppermint, colas, red wine, and acidic juices such as orange juice.  NO MINT OR MENTHOL PRODUCTS SO NO COUGH DROPS  USE SUGARLESS CANDY INSTEAD (Jolley ranchers or Stover's or Life Savers) or even ice chips will also do - the key is to swallow to prevent all throat clearing. NO OIL BASED VITAMINS - use powdered substitutes.  Avoid fish oil when coughing.     Plan B = Backup (to supplement plan A, not to replace it) Only use your albuterol inhaler as a rescue medication to be used if you can't catch your breath by resting or doing a relaxed purse lip breathing pattern.  - The less you use it, the better it will work when you need it. - Ok to use the inhaler up to 2 puffs  every 4 hours if you  must but call for appointment if use goes up over your usual need - Don't leave home without it !!  (think of it like the spare tire for your car)   Plan C = Crisis (instead of Plan B but only if Plan B stops working) - only use your albuterol nebulizer if you first try Plan B and it fails to help > ok to use the nebulizer up to every 4 hours but if start needing it regularly call for immediate appointment  Also  Ok to try albuterol 15 min before an activity (on alternating days)  that you know would usually make you short of breath and see if it makes any difference and if makes none then don't take albuterol after activity unless you can't catch your breath as this means it's the resting that helps, not the albuterol.      Please schedule a follow up visit in 3 months but call sooner if needed with pfts next available - don't use the symbicort that am

## 2023-08-06 NOTE — Assessment & Plan Note (Addendum)
Never smoker with prior dx of copd - 06/08/23  EOS  0. - 08/06/2023   Walked on RA  x  3  lap(s) =  approx 450  ft  pt started at slow pace them when moderate pace that is her normal pace for her ,she stated that she felt winded after she finsihed and had hip pain, notice that patient abnormal gait on patient pt was walking. pt has a slight limp with sats never < 94%  - 08/06/2023 max rx for gerd - 08/06/2023  After extensive coaching inhaler device,  effectiveness =    75% > continue symbicort 80 2bid pending pfts given elevated EOS at baseline (likely mild element of AB)   This is clearly copd and no CT evidence of bronchiectasis either.  Main finding on exam = obesity and pseudowheeze / upper airway cough > rx for gerd and AB and f/u with pfts when available.  Discussed in detail all the  indications, usual  risks and alternatives  relative to the benefits with patient who agrees to proceed with Rx as outlined.      Each maintenance medication was reviewed in detail including emphasizing most importantly the difference between maintenance and prns and under what circumstances the prns are to be triggered using an action plan format where appropriate.  Total time for H and P, chart review, counseling, reviewing hfa device(s) , directly observing portions of ambulatory 02 saturation study/ and generating customized AVS unique to this office visit / same day charting = 60 min new pt eval with multiple resp cc of ? Etiology

## 2023-08-23 IMAGING — DX DG FOOT COMPLETE 3+V*L*
3 series · 3 of 3 positions shown · non-contrast
Comparison: 10/14/2017

CLINICAL DATA: Great toe and second toe pain.  Blister

EXAM:
LEFT FOOT - COMPLETE 3+ VIEW

[foot ap]
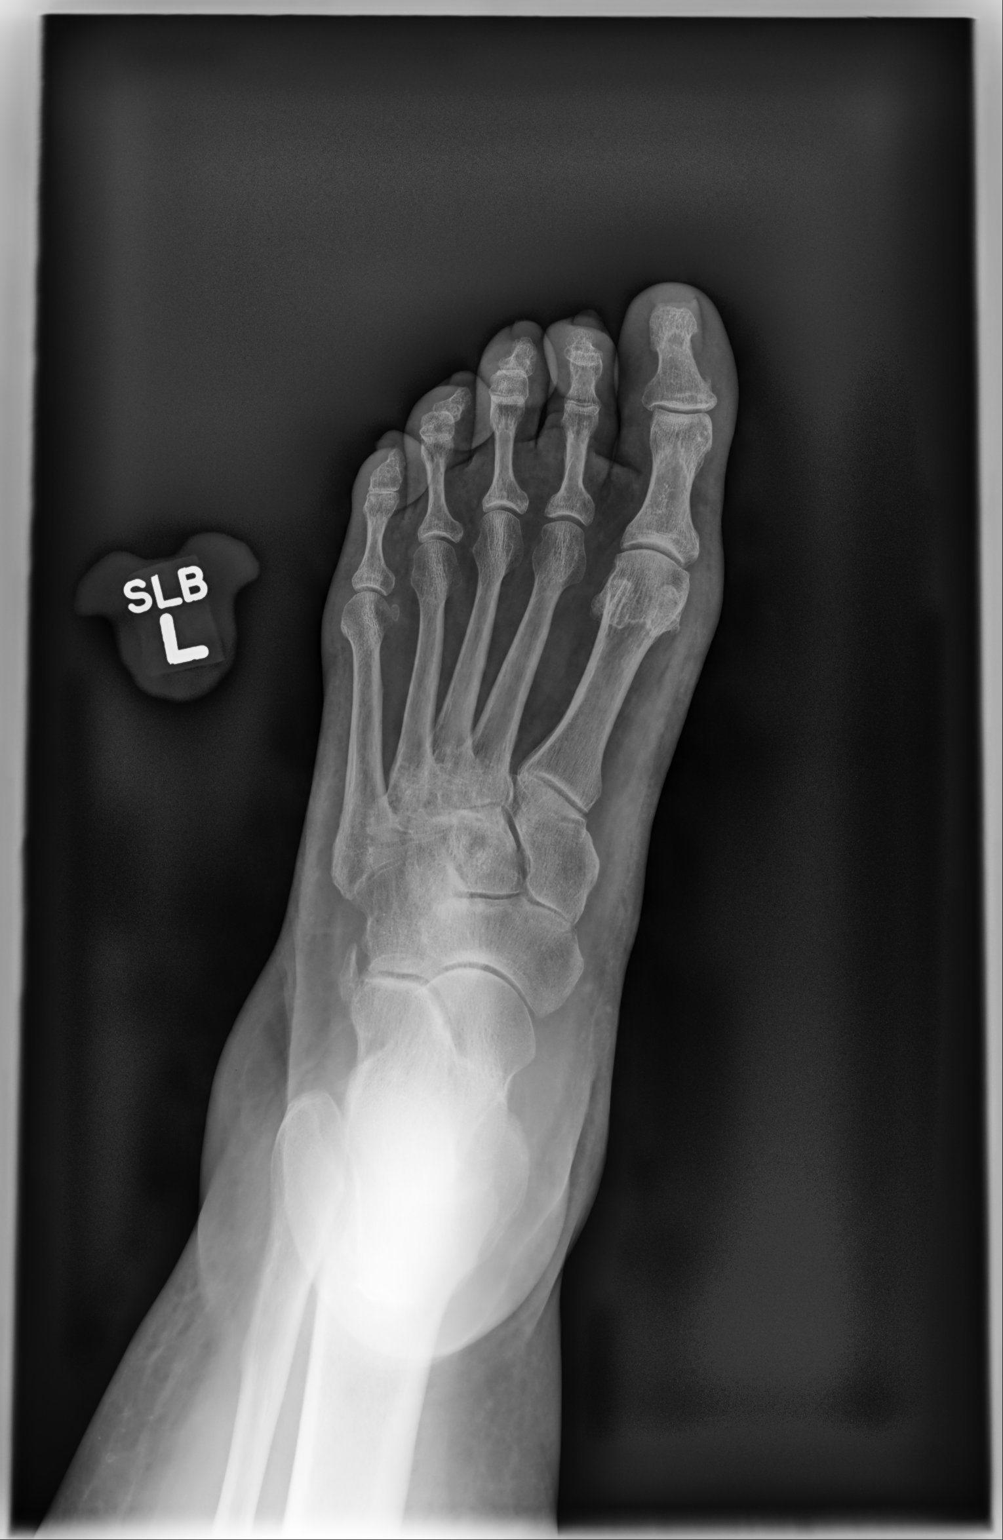

[foot mlo]
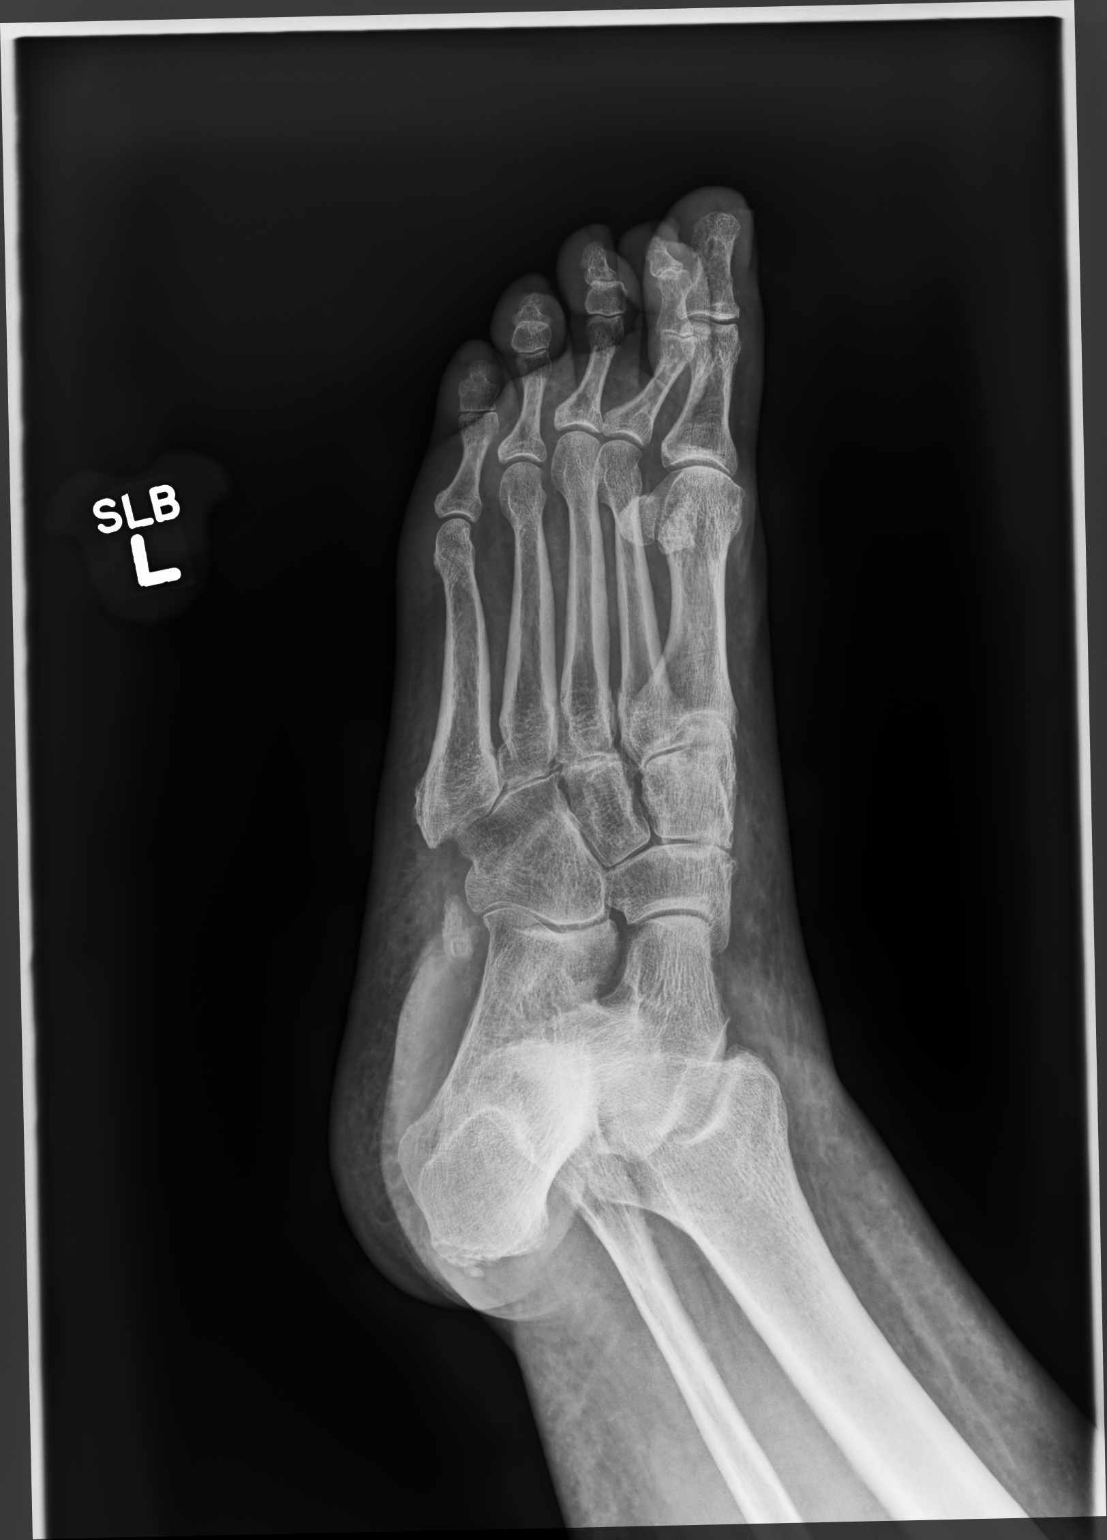

[foot lat]
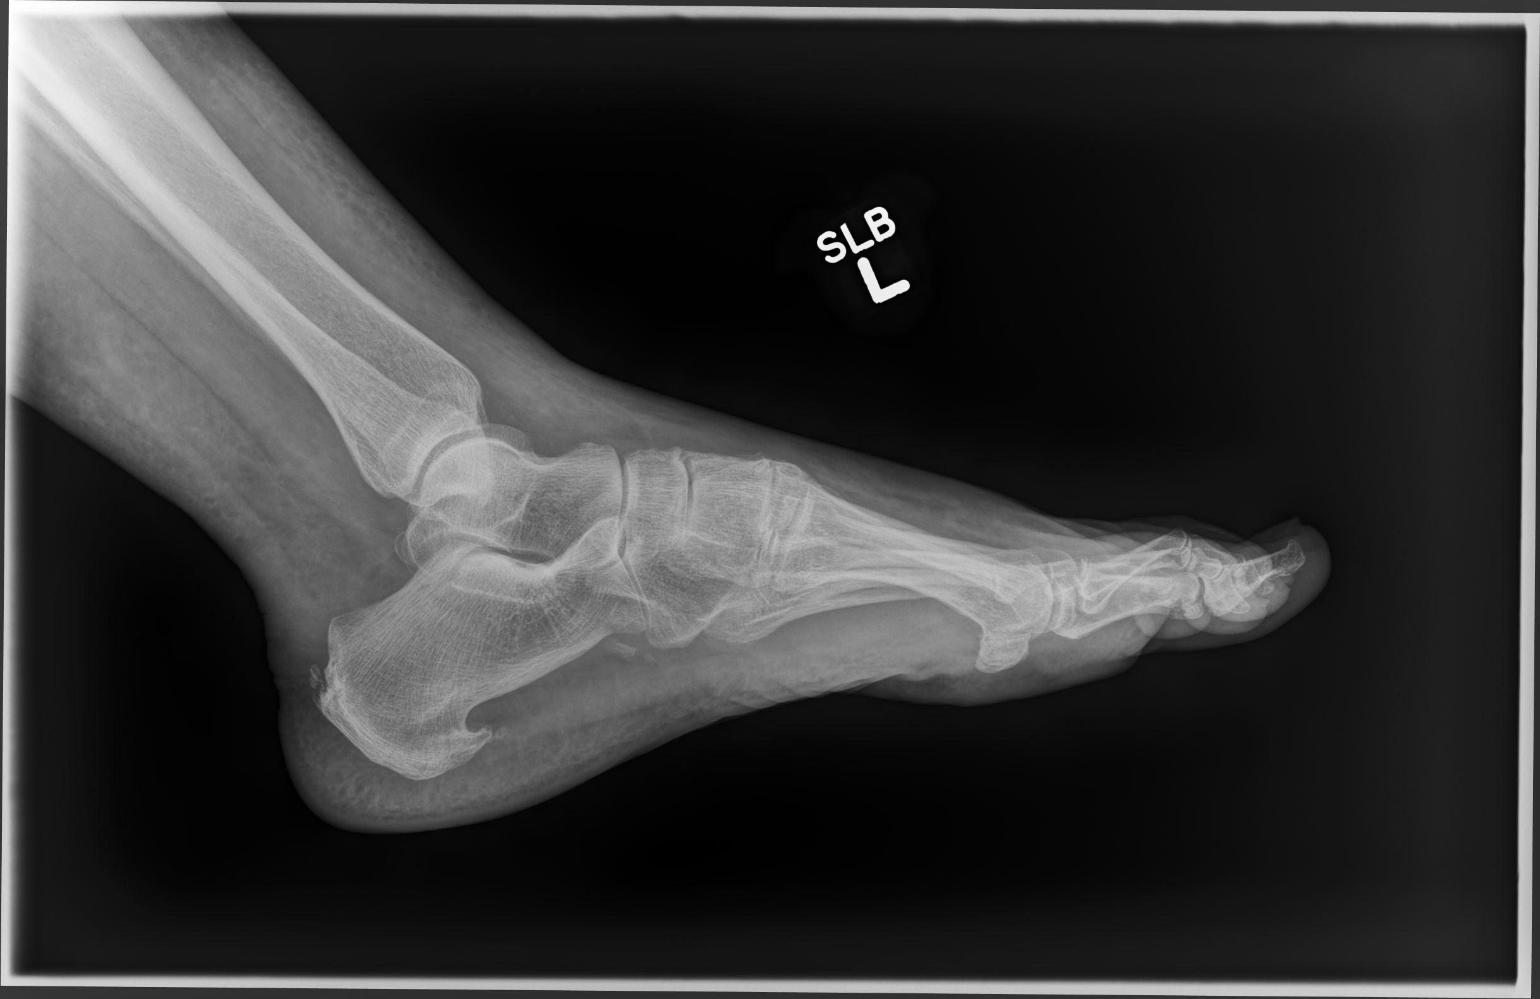

[3 of 3 positions shown; findings below may reference images not displayed]

FINDINGS: There is no evidence of fracture or dislocation. No erosion or
periosteal elevation. Minimal degenerative changes of the foot.
Bidirectional calcaneal enthesophytes. Mild generalized soft tissue
prominence. No soft tissue gas.
IMPRESSION: 1. No acute osseous abnormality of the left foot.
2. Mild generalized soft tissue prominence. No soft tissue gas.

## 2023-11-10 ENCOUNTER — Encounter: Payer: Self-pay | Admitting: *Deleted

## 2023-11-10 ENCOUNTER — Ambulatory Visit
Admission: EM | Admit: 2023-11-10 | Discharge: 2023-11-10 | Disposition: A | Discharge status: Home / Self Care | Payer: Medicaid Other | Attending: Nurse Practitioner | Admitting: Nurse Practitioner

## 2023-11-10 DIAGNOSIS — J441 Chronic obstructive pulmonary disease with (acute) exacerbation: Secondary | ICD-10-CM | POA: Diagnosis not present

## 2023-11-10 DIAGNOSIS — J101 Influenza due to other identified influenza virus with other respiratory manifestations: Secondary | ICD-10-CM

## 2023-11-10 LAB — POC COVID19/FLU A&B COMBO
Covid Antigen, POC: NEGATIVE
Influenza A Antigen, POC: POSITIVE — AB
Influenza B Antigen, POC: NEGATIVE

## 2023-11-10 MED ORDER — PREDNISONE 20 MG PO TABS
40.0000 mg | ORAL_TABLET | Freq: Every day | ORAL | 0 refills | Status: AC
Start: 2023-11-10 — End: 2023-11-15

## 2023-11-10 MED ORDER — BENZONATATE 100 MG PO CAPS: 100.0000 mg | ORAL_CAPSULE | Freq: Three times a day (TID) | ORAL | 0 refills | Status: DC | PRN

## 2023-11-10 MED ORDER — METHYLPREDNISOLONE ACETATE 40 MG/ML IJ SUSP
40.0000 mg | Freq: Once | INTRAMUSCULAR | Status: AC
  Administered 2023-11-10: 40 mg via INTRAMUSCULAR

## 2023-11-10 NOTE — ED Triage Notes (Signed)
Pt states x 2 days she has had fever, cough, congestion, SOB, sore throat. She has been taking tylneol, used her inhalers.

## 2023-11-10 NOTE — Discharge Instructions (Addendum)
You have influenza A.  You are also having an exacerbation of your COPD from the flu.  We gave you a sterid shot today, start the oral prednisone tomorrow.  Continue albuterol inhaler scheduled for the next 48 hours.  Continue Symbicort as prescribed.  Seek care emergently if symptoms worsen despite treatment.  Some things that can make you feel better are: - Increased rest - Increasing fluid with water/sugar free electrolytes - Acetaminophen and ibuprofen as needed for fever/pain - Salt water gargling, chloraseptic spray and throat lozenges - OTC guaifenesin (Mucinex) 600 mg twice daily for congestion - Saline sinus flushes or a neti pot - Humidifying the air -Tessalon Perles every 8 hours as needed for dry cough

## 2023-11-10 NOTE — ED Provider Notes (Signed)
[facility]    CSN: [id number] Arrival date & time: 11/10/23  4098      History   Chief Complaint Chief Complaint  Patient presents with   Fever   Cough   Nasal Congestion   Shortness of Breath    HPI [name] is a 58 y.o. female.   Patient presents today with 2-day history of fever, Tmax 103 F, congested cough with clear mucus, shortness of breath, wheezing, chest tightness.  She reports the shortness of breath, wheezing, chest tightness improves after she uses albuterol inhaler.  Also has runny/stuffy nose, sore throat, headache, nausea, decreased appetite, and fatigue.  No ear pain, abdominal pain, vomiting, or diarrhea.  No known sick contacts.  Has taken Tylenol and used albuterol inhaler with minimal temporary relief.    Past Medical History:  Diagnosis Date   Arthritis    Back pain 10/30/2015   Bilateral shoulder pain 10/30/2015   COPD (chronic obstructive pulmonary disease) (HCC)    Fecal occult blood test positive 10/30/2015   Hemorrhoids 10/30/2015   Hypertension    Hypothyroidism    Large breasts 10/30/2015   43 DD   Postmenopausal 10/30/2015   Sleep apnea    pt DX but did not want to use CPAP.   Thyroid disease     Patient Active Problem List   Diagnosis Date Noted   DOE (dyspnea on exertion) 08/06/2023   COPD exacerbation (HCC) 06/08/2023   Class 2 obesity 06/08/2023   GERD (gastroesophageal reflux disease) 06/08/2023   HTN (hypertension) 06/08/2023   Acute respiratory failure with hypoxia (HCC) 06/08/2023   Depression 06/08/2023   Lumbar spondylosis 02/03/2023   Chronic pain of right knee 02/03/2023   Chronic knee pain after total replacement of left knee joint 02/03/2023   Chronic pain syndrome 02/03/2023   Screening for colorectal cancer 12/07/2018   Encounter for gynecological examination with Papanicolaou smear of cervix 12/07/2018   Lichen planus 12/07/2018   Primary osteoarthritis of both hands 12/04/2017   Primary  osteoarthritis of right knee 12/04/2017   Primary osteoarthritis of both feet 12/04/2017   History of hypothyroidism 12/04/2017   Dyslipidemia 12/04/2017   Status post total left knee replacement 01/29/17 10/14/2017   Special screening for malignant neoplasms, colon    Hemorrhoids 10/30/2015   Fecal occult blood test positive 10/30/2015   Postmenopausal 10/30/2015   Large breasts 10/30/2015   Bilateral shoulder pain 10/30/2015   Back pain 10/30/2015   Medial meniscus tear    Lateral meniscus tear, current    Erosive lichen planus of vulva 08/28/2014   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   Right hip flexor tightness 12/06/2013   Plantar fasciitis 12/06/2013    Past Surgical History:  Procedure Laterality Date   BREAST REDUCTION SURGERY Bilateral 04/17/2020   Procedure: MAMMARY REDUCTION  (BREAST);  Surgeon: Allena Napoleon, MD;  Location: [facility];  Service: Plastics;  Laterality: Bilateral;  2.5 hours, please   COLONOSCOPY N/A 11/12/2015   Procedure: COLONOSCOPY;  Surgeon: West Bali, MD;  Location: [facility];  Service: Endoscopy;  Laterality: N/A;  8:30 AM   JOINT REPLACEMENT     KNEE ARTHROSCOPY WITH MEDIAL MENISECTOMY Left 05/04/2015   Procedure: KNEE ARTHROSCOPY WITH MEDIAL AND LATERAL MENISECTOMY;  Surgeon: Vickki Hearing, MD;  Location: [facility];  Service: Orthopedics;  Laterality: Left;   NASAL SINUS SURGERY     ROTATOR CUFF REPAIR     SHOULDER ARTHROSCOPY Right 10/12/2018   right rotator  cuff repair    SHOULDER SURGERY Right 10/12/2018   rotator cuff repair    TOTAL KNEE ARTHROPLASTY Left 01/29/2017   Procedure: TOTAL KNEE ARTHROPLASTY;  Surgeon: Vickki Hearing, MD;  Location: [facility];  Service: Orthopedics;  Laterality: Left;    OB History     Gravida  0   Para  0   Term  0   Preterm  0   AB  0   Living  0      SAB  0   IAB  0   Ectopic  0   Multiple  0   Live Births               Home Medications    Prior to  Admission medications   Medication Sig Start Date End Date Taking? Authorizing Provider  Acetaminophen (TYLENOL PO) Take by mouth as needed.    Yes [provider]  albuterol (ACCUNEB) 0.63 MG/3ML nebulizer solution Take 1 ampule by nebulization every 6 (six) hours as needed for wheezing.   Yes [provider]  albuterol (VENTOLIN HFA) 108 (90 Base) MCG/ACT inhaler Inhale 2 puffs into the lungs every 6 (six) hours as needed for wheezing or shortness of breath. 09/28/22  Yes Leath-Warren, Sadie Haber, NP  benzonatate (TESSALON) 100 MG capsule Take 1 capsule (100 mg total) by mouth 3 (three) times daily as needed for cough. Do not take with alcohol or while operating or driving heavy machinery 9/56/21  Yes Cathlean Marseilles A, NP  budesonide-formoterol (SYMBICORT) 80-4.5 MCG/ACT inhaler Inhale 2 puffs into the lungs 2 (two) times daily. 06/09/23  Yes Vassie Loll, MD  famotidine (PEPCID) 20 MG tablet One after supper 08/06/23  Yes Nyoka Cowden, MD  levothyroxine (SYNTHROID, LEVOTHROID) 25 MCG tablet Take 25 mcg by mouth daily before breakfast.   Yes [provider]  MAGNESIUM PO Take by mouth daily.   Yes [provider]  olmesartan-hydrochlorothiazide (BENICAR HCT) 40-25 MG tablet Take 1 tablet by mouth daily. 05/10/23  Yes [provider]  pantoprazole (PROTONIX) 40 MG tablet Take 1 tablet (40 mg total) by mouth daily. 06/10/23  Yes Vassie Loll, MD  predniSONE (DELTASONE) 20 MG tablet Take 2 tablets (40 mg total) by mouth daily with breakfast for 5 days. 11/10/23 11/15/23 Yes Cathlean Marseilles A, NP  rosuvastatin (CRESTOR) 5 MG tablet Take 5 mg by mouth daily. 03/11/23  Yes [provider]  cholecalciferol (VITAMIN D3) 25 MCG (1000 UNIT) tablet Take 1,000 Units by mouth daily.    [provider]  dextromethorphan-guaiFENesin (MUCINEX DM) 30-600 MG 12hr tablet Take 1 tablet by mouth 2 (two) times daily. Patient not taking: Reported on  08/06/2023 06/09/23   Vassie Loll, MD  diclofenac (VOLTAREN) 75 MG EC tablet Take 1 tablet (75 mg total) by mouth 2 (two) times daily. 02/03/23   Edward Jolly, MD  ondansetron (ZOFRAN) 4 MG tablet Take 4-8 mg by mouth every 8 (eight) hours as needed for nausea or vomiting. 03/13/23   [provider]    Family History Family History  Problem Relation Age of Onset   Alzheimer's disease Mother    Cancer Father        brain tumor   Diabetes Brother    Heart disease Brother    Heart attack Brother    Cancer Brother        melanoma   Lupus Maternal Grandmother    Heart disease Maternal Grandmother    Heart disease Maternal Grandfather  Hypertension Maternal Grandfather    Breast cancer Maternal Aunt        unsure of age    Breast cancer Cousin     Social History Social History   Tobacco Use   Smoking status: Never   Smokeless tobacco: Never  Vaping Use   Vaping status: Never Used  Substance Use Topics   Alcohol use: Yes    Comment: rare   Drug use: No     Allergies   Penicillins   Review of Systems Review of Systems Per HPI  Physical Exam Triage Vital Signs ED Triage Vitals  Encounter Vitals Group     BP 11/10/23 1109 118/87     Systolic BP Percentile --      Diastolic BP Percentile --      Pulse Rate 11/10/23 1109 85     Resp 11/10/23 1109 20     Temp 11/10/23 1109 98.2 F (36.8 C)     Temp Source 11/10/23 1109 Oral     SpO2 11/10/23 1109 95 %     Weight --      Height --      Head Circumference --      Peak Flow --      Pain Score 11/10/23 1108 5     Pain Loc --      Pain Education --      Exclude from Growth Chart --    No data found.  Updated Vital Signs BP 118/87 (BP Location: Right Arm)   Pulse 85   Temp 98.2 F (36.8 C) (Oral)   Resp 20   LMP 04/14/2011   SpO2 95%   Visual Acuity Right Eye Distance:   Left Eye Distance:   Bilateral Distance:    Right Eye Near:   Left Eye Near:    Bilateral Near:     Physical  Exam Vitals and nursing note reviewed.  Constitutional:      General: She is not in acute distress.    Appearance: Normal appearance. She is not ill-appearing or toxic-appearing.  HENT:     Head: Normocephalic and atraumatic.     Right Ear: Tympanic membrane, ear canal and external ear normal.     Left Ear: Tympanic membrane, ear canal and external ear normal.     Nose: Congestion present. No rhinorrhea.     Mouth/Throat:     Mouth: Mucous membranes are moist.     Pharynx: Oropharynx is clear. No oropharyngeal exudate or posterior oropharyngeal erythema.  Eyes:     General: No scleral icterus.    Extraocular Movements: Extraocular movements intact.  Cardiovascular:     Rate and Rhythm: Normal rate and regular rhythm.  Pulmonary:     Effort: Pulmonary effort is normal. No respiratory distress.     Breath sounds: Wheezing present. No rhonchi or rales.  Musculoskeletal:     Cervical back: Normal range of motion and neck supple.  Lymphadenopathy:     Cervical: No cervical adenopathy.  Skin:    General: Skin is warm and dry.     Coloration: Skin is not jaundiced or pale.     Findings: No erythema or rash.  Neurological:     Mental Status: She is alert and oriented to person, place, and time.  Psychiatric:        Behavior: Behavior is cooperative.      UC Treatments / Results  Labs (all labs ordered are listed, but only abnormal results are displayed) Labs Reviewed  POC  COVID19/FLU A&B COMBO - Abnormal; Notable for the following components:      Result Value   Influenza A Antigen, POC Positive (*)    All other components within normal limits    EKG   Radiology No results found.  Procedures Procedures (including critical care time)  Medications Ordered in UC Medications  methylPREDNISolone acetate (DEPO-MEDROL) injection 40 mg (40 mg Intramuscular Given 11/10/23 1145)    Initial Impression / Assessment and Plan / UC Course  I have reviewed the triage vital signs  and the nursing notes.  Pertinent labs & imaging results that were available during my care of the patient were reviewed by me and considered in my medical decision making (see chart for details).   Patient is well-appearing, normotensive, afebrile, not tachycardic, not tachypneic, oxygenating well on room air.    1. Influenza A 2. COPD exacerbation (HCC) Vitals and exam are reassuring today Treat with Tamiflu twice daily for 5 days given history of COPD Given ongoing wheezing despite albuterol, will give Depo-Medrol 40 mg IM in urgent care today, start oral prednisone tomorrow Other supportive care discussed including continue Symbicort, Tessalon Perles as needed for cough ER and return precautions discussed  The patient was given the opportunity to ask questions.  All questions answered to their satisfaction.  The patient is in agreement to this plan.    Final Clinical Impressions(s) / UC Diagnoses   Final diagnoses:  Influenza A  COPD exacerbation (HCC)     Discharge Instructions      You have influenza A.  You are also having an exacerbation of your COPD from the flu.  We gave you a sterid shot today, start the oral prednisone tomorrow.  Continue albuterol inhaler scheduled for the next 48 hours.  Continue Symbicort as prescribed.  Seek care emergently if symptoms worsen despite treatment.  Some things that can make you feel better are: - Increased rest - Increasing fluid with water/sugar free electrolytes - Acetaminophen and ibuprofen as needed for fever/pain - Salt water gargling, chloraseptic spray and throat lozenges - OTC guaifenesin (Mucinex) 600 mg twice daily for congestion - Saline sinus flushes or a neti pot - Humidifying the air -Tessalon Perles every 8 hours as needed for dry cough      ED Prescriptions     Medication Sig Dispense Auth. Provider   predniSONE (DELTASONE) 20 MG tablet Take 2 tablets (40 mg total) by mouth daily with breakfast for 5 days. 10  tablet Cathlean Marseilles A, NP   benzonatate (TESSALON) 100 MG capsule Take 1 capsule (100 mg total) by mouth 3 (three) times daily as needed for cough. Do not take with alcohol or while operating or driving heavy machinery 21 capsule Valentino Nose, NP      PDMP not reviewed this encounter.   Valentino Nose, NP 11/10/23 [phone]

## 2023-11-14 NOTE — Progress Notes (Unsigned)
 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 Continue assisting patient with weight loss management Reassess blood pressure and adjust antihypertensive regimen as required.   Discharge Diagnoses: Principal Problem:   Acute respiratory failure with hypoxia (HCC)   COPD exacerbation (HCC)   Class 2 obesity   History of hypothyroidism   Dyslipidemia   GERD (gastroesophageal reflux disease)   HTN (hypertension)   Depression   58 y.o. female with medical history significant of COPD, hypothyroidism, hypertension, gastroesophageal reflux disease and class II obesity; presenting to the hospital secondary to shortness of breath, intermittent coughing spells and increased wheezing.  Patient reports symptom has been present for the last 3 days or so and worsening.  At home she has inhaler and nebulizer machine and despite provided treatment symptoms has failed to improve. No fever, no chest pain, no nausea, no vomiting, no sick contacts.   In the ED chest x-ray demonstrating bronchitic changes, diffuse expiratory wheezing on exam and the presence of hypoxia especially on exertion.  2 L nasal cannula supplementation in place at time of my evaluation.   COVID PCR negative.   Assessment and Plan: 1-acute respiratory failure with hypoxia secondary to COPD exacerbation and bronchiectasis. -Patient treated with steroids, antibiotics, bronchodilator management, mucolytic's and oxygen supplementation. -Excellent  response to treatment and at time of discharge no oxygen supplementation was required -Patient discharged on steroids tapering, 4 more days of doxycycline twice a day, resumption of her as needed albuterol and Symbicort and instruction to follow-up with PCP and pulmonologist as an outpatient.   2-hypertension -Continue home antihypertensive agents -Heart healthy diet discussed with patient.   3-history of hypothyroidism -Continue Synthroid.   4-depression: -No suicidal ideation or hallucinations -Stable mood. -Continue home Wellbutrin and Lexapro.   5-GERD -Continue PPI. -Lifestyle modifications discussed with patient.   6-class II obesity -Body mass index is 38.97 kg/m. -Low-calorie diet, portion control and increase physical activity discussed with patient.   7-dyslipidemia -Continue statin.     Sinus surgery in [place]   2000 reduced need for nasal spray    History of Present Illness  08/06/2023  Pulmonary/ 1st office eval/ [name] / [facility] / no pfts done yet / symbicort 80  Chief Complaint  Patient presents with   Consult  Dyspnea:  good days 0.10 miles up to mailbox and slt down hill to house  Cough: dry hack sporadic / worse with laughing  Sleep: bed is flat / 2 pillow  SABA use: couple of times a day  02: none  Rec Plan A = Automatic = Always=   Symbicort 80 Take 2 puffs first thing in am and then another 2 puffs about 12 hours later (add the pm doses)  Work on inhaler technique:  Pantoprazole (protonix) 40 mg   Take  30-60 min before first meal of the day and Pepcid (famotidine)  20 mg after supper until return to office  GERD diet reviewed, bed  blocks rec    Plan B = Backup (to supplement plan A, not to replace it) Only use your albuterol inhaler as a rescue medication Plan C = Crisis (instead of Plan B but only if Plan B Also  Ok to try albuterol 15 min before an activity (on alternating days)  that you know would usually make you short of breath       Please schedule a follow up visit in 3 months but call sooner if needed with pfts next available - don't use the symbicort that am > not done as of 11/16/2023    Sick   11/06/23  sore throat / fever with UC eval 11/10/23  > depomedrol only    11/16/2023  f/u ov/[facility]/[name] re: AB maint on symb 80 2bid   Chief Complaint  Patient presents with   Follow-up    3 month follow up   Dyspnea:  improving p steroid shot but still very congested  Cough: borwn mucus from nose/ coughing it also  Sleeping: flat bed / 2 pillows s    resp cc  SABA use: using about 3-4 / day, only once  on day ov / not needing neb  02: none       No obvious day to day or daytime variability or assoc excess/ purulent sputum or mucus plugs or hemoptysis or cp or chest tightness, subjective wheeze or overt sinus or hb symptoms.    Also denies any obvious fluctuation of symptoms with weather or environmental changes or other aggravating or alleviating factors except as outlined above   No unusual exposure hx or h/o childhood pna/ asthma or knowledge of premature birth.  Current Allergies, Complete Past Medical History, Past Surgical History, Family History, and Social History were reviewed in [employer] record.  ROS  The following are not active complaints unless bolded Hoarseness, sore throat, dysphagia, dental problems, itching, sneezing,  nasal congestion or discharge of excess mucus or purulent secretions, ear ache,   fever, chills, sweats, unintended wt loss or wt gain, classically pleuritic or exertional cp,  orthopnea pnd or arm/hand swelling  or leg swelling, presyncope, palpitations, abdominal pain, anorexia, nausea, vomiting, diarrhea  or change in bowel habits or change in bladder habits, change in stools or change in urine, dysuria, hematuria,  rash, arthralgias, visual complaints, headache, numbness, weakness or ataxia or problems with walking or coordination,  change in mood  or  memory.        Current Meds  Medication Sig   Acetaminophen (TYLENOL PO) Take by mouth as needed.    albuterol (ACCUNEB) 0.63 MG/3ML nebulizer solution Take 1 ampule by nebulization every 6 (six) hours as needed for wheezing.   albuterol (VENTOLIN HFA) 108 (90 Base) MCG/ACT inhaler Inhale 2 puffs into the lungs every 6 (six) hours as needed for wheezing or shortness of breath.   benzonatate (TESSALON) 100 MG capsule Take 1 capsule (100 mg total) by mouth 3 (three) times daily as needed for cough. Do not take with alcohol or while operating or driving heavy machinery   budesonide-formoterol (SYMBICORT) 80-4.5 MCG/ACT inhaler Inhale 2 puffs into the lungs 2 (two) times daily.   cholecalciferol (VITAMIN D3) 25 MCG (1000 UNIT) tablet Take 1,000 Units by mouth daily.   dextromethorphan-guaiFENesin (MUCINEX DM) 30-600 MG 12hr tablet Take 1 tablet by mouth 2 (two) times daily.   diclofenac (VOLTAREN) 75 MG EC tablet Take 1 tablet (75 mg total) by mouth 2 (two) times daily.  famotidine (PEPCID) 20 MG tablet One after supper   levothyroxine (SYNTHROID, LEVOTHROID) 25 MCG tablet Take 25 mcg by mouth daily before breakfast.   MAGNESIUM PO Take by mouth daily.   olmesartan-hydrochlorothiazide (BENICAR HCT) 40-25 MG tablet Take 1 tablet by mouth daily.   ondansetron (ZOFRAN) 4 MG tablet Take 4-8 mg by mouth every 8 (eight) hours as needed for nausea or vomiting.   pantoprazole (PROTONIX) 40 MG tablet Take 1 tablet (40 mg total) by mouth daily.   rosuvastatin (CRESTOR) 5 MG tablet Take 5 mg by mouth daily.           Past Medical History:  Diagnosis Date   Arthritis    Back pain 10/30/2015   Bilateral shoulder pain 10/30/2015   COPD (chronic obstructive pulmonary disease) (HCC)    Fecal occult blood test positive 10/30/2015   Hemorrhoids 10/30/2015   Hypertension    Hypothyroidism    Large breasts 10/30/2015   44 DD   Postmenopausal 10/30/2015   Sleep apnea    pt DX but did not want to use CPAP.    Thyroid disease       Objective:    Wts  11/16/2023       223   08/06/23 223 lb (101.2 kg)  07/20/23 225 lb (102.1 kg)  06/08/23 220 lb (99.8 kg)     Vital signs reviewed  11/16/2023  - Note at rest 02 sats  98% on RA   General appearance:    mod obese (by BMI) amb wf/ congested sounding cough    HEENT : Oropharynx  clear      Nasal turbinates mod edema   NECK :  without  apparent JVD/ palpable Nodes/TM    LUNGS: no acc muscle use,  Nl contour chest with minimal insp/exp rhonchi bilaterally   CV:  RRR  no s3 or murmur or increase in P2, and no edema   ABD:  soft and nontender   MS:  Gait nl   ext warm without deformities Or obvious joint restrictions  calf tenderness, cyanosis or clubbing    SKIN: warm and dry without lesions    NEURO:  alert, approp, nl sensorium with  no motor or cerebellar deficits apparent.        Assessment

## 2023-11-16 ENCOUNTER — Encounter: Payer: Self-pay | Admitting: Internal Medicine

## 2023-11-16 ENCOUNTER — Telehealth: Payer: Self-pay | Admitting: Internal Medicine

## 2023-11-16 ENCOUNTER — Ambulatory Visit: Payer: Medicaid Other | Admitting: Internal Medicine

## 2023-11-16 VITALS — BP 99/65 | HR 73 | Ht 63.0 in | Wt 223.0 lb

## 2023-11-16 DIAGNOSIS — J4521 Mild intermittent asthma with (acute) exacerbation: Secondary | ICD-10-CM

## 2023-11-16 DIAGNOSIS — J45901 Unspecified asthma with (acute) exacerbation: Secondary | ICD-10-CM | POA: Insufficient documentation

## 2023-11-16 MED ORDER — METHYLPREDNISOLONE ACETATE 80 MG/ML IJ SUSP
120.0000 mg | Freq: Once | INTRAMUSCULAR | Status: AC
Start: 2023-11-16 — End: 2023-11-16
  Administered 2023-11-16: 120 mg via INTRAMUSCULAR

## 2023-11-16 MED ORDER — METHYLPREDNISOLONE ACETATE 80 MG/ML IJ SUSP
80.0000 mg | Freq: Once | INTRAMUSCULAR | Status: DC
Start: 2023-11-16 — End: 2023-11-16

## 2023-11-16 MED ORDER — CEFDINIR 300 MG PO CAPS
300.0000 mg | ORAL_CAPSULE | Freq: Two times a day (BID) | ORAL | 0 refills | Status: DC
Start: 2023-11-16 — End: 2024-02-03

## 2023-11-16 NOTE — Assessment & Plan Note (Signed)
 Never smoker  - flair with brown mucus p flu A  10/2023  but lungs clear  - 11/16/2023 rx depo 120 mg IM / omnicef x 10 day and f/u PFT - 11/16/2023  After extensive coaching inhaler device,  effectiveness =    60% from a baseline of 30% (very late trigger)   Tolerated an exac despite poor hfa so no change maint  rx needed  Add omnicef 300 mg bid x 10 d  Depomedrol 120 mg IM     F/u  3 m with pfts          Each maintenance medication was reviewed in detail including emphasizing most importantly the difference between maintenance and prns and under what circumstances the prns are to be triggered using an action plan format where appropriate.  Total time for H and P, chart review, counseling, reviewing hfa/neb  device(s) and generating customized AVS unique to this office visit / same day charting = 30 min

## 2023-11-16 NOTE — Patient Instructions (Addendum)
 My office will be contacting you by phone for referral to PFTs (second try)   - if you don't hear back from my office within one week please call us back or notify us thru MyChart and we'll address it right away.   Depomedrol 120 mg IM   Work on inhaler technique:  relax and gently blow all the way out then take a nice smooth full deep breath back in, triggering the inhaler at same time you start breathing in.  Hold breath in for at least  5 seconds if you can. Blow out symbicort  thru nose. Rinse and gargle with water when done.  If mouth or throat bother you at all,  try brushing teeth/gums/tongue with arm and hammer toothpaste/ make a slurry and gargle and spit out.   Please schedule a follow up visit in 3 months but call sooner if needed - bring inhalers   Late add Omnicef x 10 days  ("allergy" was yeast from pcn)

## 2023-11-16 NOTE — Telephone Encounter (Signed)
 I sent in omnicef  x 10 d which should turn the mucus white again and if not call for f/u

## 2023-11-17 NOTE — Telephone Encounter (Signed)
 Called and spoke w/ pt informed patient of this medication  that was sent to pharmacy, pt stated that she will begin taking this and will follow up if needed.

## 2023-11-18 ENCOUNTER — Telehealth: Payer: Self-pay | Admitting: Internal Medicine

## 2023-11-18 ENCOUNTER — Ambulatory Visit
Admission: EM | Admit: 2023-11-18 | Discharge: 2023-11-18 | Disposition: A | Discharge status: Home / Self Care | Payer: Medicaid Other

## 2023-11-18 ENCOUNTER — Encounter (HOSPITAL_COMMUNITY): Payer: Self-pay

## 2023-11-18 ENCOUNTER — Emergency Department (HOSPITAL_COMMUNITY)
Admission: EM | Admit: 2023-11-18 | Discharge: 2023-11-18 | Discharge status: Against Medical Advice | Payer: Medicaid Other | Attending: Emergency Medicine | Admitting: Emergency Medicine

## 2023-11-18 ENCOUNTER — Other Ambulatory Visit: Payer: Self-pay

## 2023-11-18 DIAGNOSIS — R52 Pain, unspecified: Secondary | ICD-10-CM | POA: Insufficient documentation

## 2023-11-18 DIAGNOSIS — R1310 Dysphagia, unspecified: Secondary | ICD-10-CM | POA: Insufficient documentation

## 2023-11-18 DIAGNOSIS — Z5321 Procedure and treatment not carried out due to patient leaving prior to being seen by health care provider: Secondary | ICD-10-CM | POA: Diagnosis not present

## 2023-11-18 NOTE — Telephone Encounter (Signed)
 LVM for patient regarding th Tuesday 12/22/23 3:00pm PFT at [facility] time is 2:45 pm 1st floor registration desk for check in---will mail information to patient and requested she call with questions or concerns

## 2023-11-18 NOTE — ED Triage Notes (Signed)
 Pt arrived via POV c/o possibly having a fish bone lodged in her throat. Pt able to speak and breathe. Pt endorses pain, where fish bone possibly scratching the inside of her throat.

## 2023-12-22 ENCOUNTER — Inpatient Hospital Stay (HOSPITAL_COMMUNITY): Admission: RE | Admit: 2023-12-22 | Payer: Medicaid Other | Source: Ambulatory Visit

## 2023-12-23 ENCOUNTER — Telehealth: Payer: Self-pay

## 2023-12-23 NOTE — Telephone Encounter (Signed)
 Please contact patient re: missed PFT appt. Thank you!

## 2023-12-23 NOTE — Telephone Encounter (Signed)
 Copied from CRM [id number]. Topic: Appointments - Appointment Cancel/Reschedule >> Dec 22, 2023  4:50 PM Renie Ora wrote: Patient is calling to  eschedule an appointment. Patient originally scheduled for PFT test on today with Dr.Wert. Patient stated she forgot about the appointment and would like to reschedule, no PFT referral on file. Patient will need new PFT order before reschedule.

## 2024-01-27 ENCOUNTER — Encounter (HOSPITAL_COMMUNITY): Payer: Self-pay

## 2024-02-03 ENCOUNTER — Encounter: Payer: Self-pay | Admitting: Physician Assistant

## 2024-02-03 ENCOUNTER — Ambulatory Visit: Admitting: Physician Assistant

## 2024-02-03 VITALS — BP 110/73 | HR 88 | Temp 98.4°F | Ht 63.0 in | Wt 222.6 lb

## 2024-02-03 DIAGNOSIS — E039 Hypothyroidism, unspecified: Secondary | ICD-10-CM | POA: Diagnosis not present

## 2024-02-03 DIAGNOSIS — E782 Mixed hyperlipidemia: Secondary | ICD-10-CM

## 2024-02-03 DIAGNOSIS — Z1231 Encounter for screening mammogram for malignant neoplasm of breast: Secondary | ICD-10-CM | POA: Diagnosis not present

## 2024-02-03 DIAGNOSIS — Z7689 Persons encountering health services in other specified circumstances: Secondary | ICD-10-CM

## 2024-02-03 DIAGNOSIS — I10 Essential (primary) hypertension: Secondary | ICD-10-CM

## 2024-02-03 MED ORDER — ROSUVASTATIN CALCIUM 5 MG PO TABS: 5.0000 mg | ORAL_TABLET | Freq: Every day | ORAL | 3 refills | Status: DC

## 2024-02-03 NOTE — Progress Notes (Signed)
 New Patient Office Visit  Subjective    Patient ID: [name], female    DOB: 1966-09-01  Age: 58 y.o. MRN: [id number]  CC: No chief complaint on file.   HPI Yanexi Dougal presents to establish care Patient presents today with past medical history significant for hypertension, hyperlipidemia, osteoarthritis, GERD, back pain, depression, hypothyroidism, and COPD. Patient following regularly with Dr. Waymond Hailey for management/workup of COPD. Patient does not have concerns of complaints today. She is overdue for a Pap and mammogram.   Outpatient Encounter Medications as of 02/03/2024  Medication Sig   Acetaminophen  (TYLENOL  PO) Take by mouth as needed.    albuterol  (VENTOLIN  HFA) 108 (90 Base) MCG/ACT inhaler Inhale 2 puffs into the lungs every 6 (six) hours as needed for wheezing or shortness of breath.   budesonide -formoterol  (SYMBICORT ) 80-4.5 MCG/ACT inhaler Inhale 2 puffs into the lungs 2 (two) times daily.   cholecalciferol (VITAMIN D3) 25 MCG (1000 UNIT) tablet Take 1,000 Units by mouth daily.   diclofenac  (VOLTAREN ) 75 MG EC tablet Take 1 tablet (75 mg total) by mouth 2 (two) times daily.   famotidine  (PEPCID ) 20 MG tablet One after supper   levothyroxine  (SYNTHROID , LEVOTHROID) 25 MCG tablet Take 25 mcg by mouth daily before breakfast.   olmesartan -hydrochlorothiazide  (BENICAR  HCT) 40-25 MG tablet Take 1 tablet by mouth daily.   ondansetron  (ZOFRAN ) 4 MG tablet Take 4-8 mg by mouth every 8 (eight) hours as needed for nausea or vomiting.   pantoprazole  (PROTONIX ) 40 MG tablet Take 1 tablet (40 mg total) by mouth daily.   rosuvastatin  (CRESTOR ) 5 MG tablet Take 1 tablet (5 mg total) by mouth daily.   [DISCONTINUED] albuterol  (ACCUNEB ) 0.63 MG/3ML nebulizer solution Take 1 ampule by nebulization every 6 (six) hours as needed for wheezing.   [DISCONTINUED] benzonatate  (TESSALON ) 100 MG capsule Take 1 capsule (100 mg total) by mouth 3 (three) times daily as needed for cough.  Do not take with alcohol or while operating or driving heavy machinery (Patient not taking: Reported on 02/03/2024)   [DISCONTINUED] cefdinir  (OMNICEF ) 300 MG capsule Take 1 capsule (300 mg total) by mouth 2 (two) times daily. (Patient not taking: Reported on 02/03/2024)   [DISCONTINUED] dextromethorphan -guaiFENesin  (MUCINEX  DM) 30-600 MG 12hr tablet Take 1 tablet by mouth 2 (two) times daily. (Patient not taking: Reported on 02/03/2024)   [DISCONTINUED] MAGNESIUM  PO Take by mouth daily. (Patient not taking: Reported on 02/03/2024)   [DISCONTINUED] rosuvastatin  (CRESTOR ) 5 MG tablet Take 5 mg by mouth daily. (Patient not taking: Reported on 02/03/2024)   No facility-administered encounter medications on file as of 02/03/2024.    Past Medical History:  Diagnosis Date   Arthritis    Back pain 10/30/2015   Bilateral shoulder pain 10/30/2015   COPD (chronic obstructive pulmonary disease) (HCC)    Fecal occult blood test positive 10/30/2015   Hemorrhoids 10/30/2015   Hypertension    Hypothyroidism    Large breasts 10/30/2015   43 DD   Postmenopausal 10/30/2015   Sleep apnea    pt DX but did not want to use CPAP.   Thyroid  disease     Past Surgical History:  Procedure Laterality Date   BREAST REDUCTION SURGERY Bilateral 04/17/2020   Procedure: MAMMARY REDUCTION  (BREAST);  Surgeon: Barb Bonito, MD;  Location: [facility];  Service: Plastics;  Laterality: Bilateral;  2.5 hours, please   COLONOSCOPY N/A 11/12/2015   Procedure: COLONOSCOPY;  Surgeon: Alyce Jubilee, MD;  Location: [facility];  Service: Endoscopy;  Laterality: N/A;  8:30 AM   JOINT REPLACEMENT     KNEE ARTHROSCOPY WITH MEDIAL MENISECTOMY Left 05/04/2015   Procedure: KNEE ARTHROSCOPY WITH MEDIAL AND LATERAL MENISECTOMY;  Surgeon: Darrin Emerald, MD;  Location: [facility];  Service: Orthopedics;  Laterality: Left;   NASAL SINUS SURGERY     ROTATOR CUFF REPAIR     SHOULDER ARTHROSCOPY Right 10/12/2018   right rotator cuff  repair    SHOULDER SURGERY Right 10/12/2018   rotator cuff repair    TOTAL KNEE ARTHROPLASTY Left 01/29/2017   Procedure: TOTAL KNEE ARTHROPLASTY;  Surgeon: Darrin Emerald, MD;  Location: [facility];  Service: Orthopedics;  Laterality: Left;    Family History  Problem Relation Age of Onset   Alzheimer's disease Mother    Cancer Father        brain tumor   Diabetes Brother    Heart disease Brother    Heart attack Brother    Cancer Brother        melanoma   Lupus Maternal Grandmother    Heart disease Maternal Grandmother    Heart disease Maternal Grandfather    Hypertension Maternal Grandfather    Breast cancer Maternal Aunt        unsure of age    Breast cancer Cousin     Social History   Socioeconomic History   Marital status: Single    Spouse name: Not on file   Number of children: Not on file   Years of education: Not on file   Highest education level: Not on file  Occupational History   Not on file  Tobacco Use   Smoking status: Never   Smokeless tobacco: Never  Vaping Use   Vaping status: Never Used  Substance and Sexual Activity   Alcohol use: Yes    Comment: rare   Drug use: No   Sexual activity: Yes    Birth control/protection: Post-menopausal, None  Other Topics Concern   Not on file  Social History Narrative   Not on file   Social Drivers of Health   Financial Resource Strain: Not on file  Food Insecurity: No Food Insecurity (06/08/2023)   Hunger Vital Sign    Worried About Running Out of Food in the Last Year: Never true    Ran Out of Food in the Last Year: Never true  Transportation Needs: No Transportation Needs (06/08/2023)   PRAPARE - Administrator, Civil Service (Medical): No    Lack of Transportation (Non-Medical): No  Physical Activity: Not on file  Stress: Not on file  Social Connections: Not on file  Intimate Partner Violence: Not At Risk (06/08/2023)   Humiliation, Afraid, Rape, and Kick questionnaire    Fear of Current  or Ex-Partner: No    Emotionally Abused: No    Physically Abused: No    Sexually Abused: No    Review of Systems  Constitutional:  Negative for chills, fever and malaise/fatigue.  Eyes:  Negative for blurred vision and double vision.  Respiratory:  Negative for cough and shortness of breath.   Cardiovascular:  Negative for chest pain and palpitations.  Musculoskeletal:  Negative for joint pain and myalgias.  Neurological:  Negative for dizziness and headaches.  Psychiatric/Behavioral:  Negative for depression. The patient is not nervous/anxious.         Objective    BP 110/73   Pulse 88   Temp 98.4 F (36.9 C)   Ht 5\' 3"  (  1.6 m)   Wt 222 lb 9.6 oz (101 kg)   LMP 04/14/2011   SpO2 99%   BMI 39.43 kg/m   Physical Exam Constitutional:      Appearance: Normal appearance. She is obese.  HENT:     Head: Normocephalic.     Mouth/Throat:     Mouth: Mucous membranes are moist.     Pharynx: Oropharynx is clear.  Eyes:     Extraocular Movements: Extraocular movements intact.     Conjunctiva/sclera: Conjunctivae normal.  Cardiovascular:     Rate and Rhythm: Normal rate and regular rhythm.     Heart sounds: Normal heart sounds. No murmur heard. Pulmonary:     Effort: Pulmonary effort is normal.     Breath sounds: Normal breath sounds.  Musculoskeletal:     Right lower leg: No edema.     Left lower leg: No edema.  Skin:    General: Skin is warm and dry.  Neurological:     General: No focal deficit present.     Mental Status: She is alert and oriented to person, place, and time.  Psychiatric:        Mood and Affect: Mood normal.        Behavior: Behavior normal.       Assessment & Plan:  Encounter to establish care  Primary hypertension Assessment & Plan: 110/73 Controlled. Continue current medications. No change in management. Discussed DASH diet and dietary sodium restrictions.  Increase dietary efforts and physical activity.   Orders: -     CBC with  Differential/Platelet -     CMP14+EGFR  Hypothyroidism, unspecified type Assessment & Plan: Stable. Lab work today, continue current treatment plan. Will increase levothyroxine  as indicated.   Orders: -     TSH + free T4  Mixed hyperlipidemia Assessment & Plan: Stable. Restart on previous dose of Crestor .  Discussed healthy diet and lifestyle. Lipid panel today.   Orders: -     Lipid panel -     Rosuvastatin  Calcium ; Take 1 tablet (5 mg total) by mouth daily.  Dispense: 30 tablet; Refill: 3  Encounter for screening mammogram for malignant neoplasm of breast -     3D Screening Mammogram, Left and Right    Return in about 6 months (around 08/05/2024) for physical w/ pap .   Jearlean Mince Lamira Borin, PA-C

## 2024-02-03 NOTE — Assessment & Plan Note (Signed)
 Stable. Restart on previous dose of Crestor .  Discussed healthy diet and lifestyle. Lipid panel today.

## 2024-02-03 NOTE — Assessment & Plan Note (Signed)
 110/73 Controlled. Continue current medications. No change in management. Discussed DASH diet and dietary sodium restrictions.  Increase dietary efforts and physical activity.

## 2024-02-03 NOTE — Assessment & Plan Note (Signed)
 Stable. Lab work today, continue current treatment plan. Will increase levothyroxine  as indicated.

## 2024-02-05 ENCOUNTER — Ambulatory Visit: Payer: Self-pay | Admitting: Physician Assistant

## 2024-02-05 LAB — CBC WITH DIFFERENTIAL/PLATELET
Basophils Absolute: 0.1 10*3/uL (ref 0.0–0.2)
Basos: 1 %
EOS (ABSOLUTE): 0.4 10*3/uL (ref 0.0–0.4)
Eos: 4 %
Hematocrit: 37.1 % (ref 34.0–46.6)
Hemoglobin: 12.3 g/dL (ref 11.1–15.9)
Immature Grans (Abs): 0 10*3/uL (ref 0.0–0.1)
Immature Granulocytes: 0 %
Lymphocytes Absolute: 2.2 10*3/uL (ref 0.7–3.1)
Lymphs: 21 %
MCH: 29.6 pg (ref 26.6–33.0)
MCHC: 33.2 g/dL (ref 31.5–35.7)
MCV: 89 fL (ref 79–97)
Monocytes Absolute: 1 10*3/uL — ABNORMAL HIGH (ref 0.1–0.9)
Monocytes: 9 %
Neutrophils Absolute: 6.6 10*3/uL (ref 1.4–7.0)
Neutrophils: 65 %
Platelets: 233 10*3/uL (ref 150–450)
RBC: 4.15 x10E6/uL (ref 3.77–5.28)
RDW: 13.7 % (ref 11.7–15.4)
WBC: 10.2 10*3/uL (ref 3.4–10.8)

## 2024-02-05 LAB — CMP14+EGFR
ALT: 21 IU/L (ref 0–32)
AST: 23 IU/L (ref 0–40)
Albumin: 4.1 g/dL (ref 3.8–4.9)
Alkaline Phosphatase: 127 IU/L — ABNORMAL HIGH (ref 44–121)
BUN/Creatinine Ratio: 24 — ABNORMAL HIGH (ref 9–23)
BUN: 25 mg/dL — ABNORMAL HIGH (ref 6–24)
Bilirubin Total: 0.3 mg/dL (ref 0.0–1.2)
CO2: 23 mmol/L (ref 20–29)
Calcium: 9.3 mg/dL (ref 8.7–10.2)
Chloride: 97 mmol/L (ref 96–106)
Creatinine, Ser: 1.03 mg/dL — ABNORMAL HIGH (ref 0.57–1.00)
Globulin, Total: 2.9 g/dL (ref 1.5–4.5)
Glucose: 101 mg/dL — ABNORMAL HIGH (ref 70–99)
Potassium: 4.3 mmol/L (ref 3.5–5.2)
Sodium: 137 mmol/L (ref 134–144)
Total Protein: 7 g/dL (ref 6.0–8.5)
eGFR: 63 mL/min/{1.73_m2} (ref >59)

## 2024-02-05 LAB — LIPID PANEL
Chol/HDL Ratio: 4.9 ratio — ABNORMAL HIGH (ref 0.0–4.4)
Cholesterol, Total: 248 mg/dL — ABNORMAL HIGH (ref 100–199)
HDL: 51 mg/dL (ref >39)
LDL Chol Calc (NIH): 162 mg/dL — ABNORMAL HIGH (ref 0–99)
Triglycerides: 189 mg/dL — ABNORMAL HIGH (ref 0–149)
VLDL Cholesterol Cal: 35 mg/dL (ref 5–40)

## 2024-02-05 LAB — TSH+FREE T4
Free T4: 1.2 ng/dL (ref 0.82–1.77)
TSH: 2.9 u[IU]/mL (ref 0.450–4.500)

## 2024-02-12 NOTE — Telephone Encounter (Signed)
 Contacted patient, agreed to cancel 5/27 visit with Dr. Waymond Hailey due to no PFT, patient states she hasn't been able to get the PFT rescheduled. Left voicemail for respiratory therapist to reschedule PFT. Patient will schedule visit with Dr. Waymond Hailey after getting rescheduled for PFT

## 2024-02-16 ENCOUNTER — Ambulatory Visit: Payer: Medicaid Other | Admitting: Internal Medicine

## 2024-02-24 ENCOUNTER — Ambulatory Visit (HOSPITAL_COMMUNITY)
Admission: RE | Admit: 2024-02-24 | Discharge: 2024-02-24 | Disposition: A | Discharge status: Home / Self Care | Source: Ambulatory Visit | Attending: Physician Assistant | Admitting: Physician Assistant

## 2024-02-24 ENCOUNTER — Encounter (HOSPITAL_COMMUNITY): Payer: Self-pay

## 2024-02-24 DIAGNOSIS — Z1231 Encounter for screening mammogram for malignant neoplasm of breast: Secondary | ICD-10-CM | POA: Insufficient documentation

## 2024-03-08 ENCOUNTER — Ambulatory Visit (HOSPITAL_COMMUNITY)
Admission: RE | Admit: 2024-03-08 | Discharge: 2024-03-08 | Disposition: A | Discharge status: Home / Self Care | Source: Ambulatory Visit | Attending: Internal Medicine | Admitting: Internal Medicine

## 2024-03-08 DIAGNOSIS — J4521 Mild intermittent asthma with (acute) exacerbation: Secondary | ICD-10-CM | POA: Insufficient documentation

## 2024-03-08 LAB — PULMONARY FUNCTION TEST
DL/VA % pred: 101 %
DL/VA: 4.3 ml/min/mmHg/L
DLCO unc % pred: 90 %
DLCO unc: 17.67 ml/min/mmHg
FEF 25-75 Post: 2.09 L/s
FEF 25-75 Pre: 1.33 L/s
FEF2575-%Change-Post: 57 %
FEF2575-%Pred-Post: 89 %
FEF2575-%Pred-Pre: 56 %
FEV1-%Change-Post: 9 %
FEV1-%Pred-Post: 87 %
FEV1-%Pred-Pre: 80 %
FEV1-Post: 2.18 L
FEV1-Pre: 2 L
FEV1FVC-%Change-Post: 5 %
FEV1FVC-%Pred-Pre: 92 %
FEV6-%Change-Post: 4 %
FEV6-%Pred-Post: 92 %
FEV6-%Pred-Pre: 88 %
FEV6-Post: 2.85 L
FEV6-Pre: 2.73 L
FEV6FVC-%Change-Post: 1 %
FEV6FVC-%Pred-Post: 103 %
FEV6FVC-%Pred-Pre: 101 %
FVC-%Change-Post: 3 %
FVC-%Pred-Post: 89 %
FVC-%Pred-Pre: 86 %
FVC-Post: 2.87 L
FVC-Pre: 2.77 L
Post FEV1/FVC ratio: 76 %
Post FEV6/FVC ratio: 100 %
Pre FEV1/FVC ratio: 72 %
Pre FEV6/FVC Ratio: 98 %
RV % pred: 128 %
RV: 2.46 L
TLC % pred: 111 %
TLC: 5.48 L

## 2024-03-08 MED ORDER — ALBUTEROL SULFATE (2.5 MG/3ML) 0.083% IN NEBU
2.5000 mg | INHALATION_SOLUTION | Freq: Once | RESPIRATORY_TRACT | Status: AC
  Administered 2024-03-08: 2.5 mg via RESPIRATORY_TRACT

## 2024-03-10 ENCOUNTER — Encounter: Payer: Self-pay | Admitting: Student in an Organized Health Care Education/Training Program

## 2024-03-10 ENCOUNTER — Ambulatory Visit
Attending: Student in an Organized Health Care Education/Training Program | Admitting: Student in an Organized Health Care Education/Training Program

## 2024-03-10 VITALS — BP 119/82 | HR 69 | Temp 98.4°F | Resp 16 | Ht 62.5 in | Wt 215.0 lb

## 2024-03-10 DIAGNOSIS — M47816 Spondylosis without myelopathy or radiculopathy, lumbar region: Secondary | ICD-10-CM | POA: Diagnosis not present

## 2024-03-10 DIAGNOSIS — G894 Chronic pain syndrome: Secondary | ICD-10-CM | POA: Insufficient documentation

## 2024-03-10 NOTE — Patient Instructions (Signed)
 ______________________________________________________________________    General Risks and Possible Complications  Patient Responsibilities: It is important that you read this as it is part of your informed consent. It is our duty to inform you of the risks and possible complications associated with treatments offered to you. It is your responsibility as a patient to read this and to ask questions about anything that is not clear or that you believe was not covered in this document.  Patient's Rights: You have the right to refuse treatment. You also have the right to change your mind, even after initially having agreed to have the treatment done. However, under this last option, if you wait until the last second to change your mind, you may be charged for the materials used up to that point.  Introduction: Medicine is not an Visual merchandiser. Everything in Medicine, including the lack of treatment(s), carries the potential for danger, harm, or loss (which is by definition: Risk). In Medicine, a complication is a secondary problem, condition, or disease that can aggravate an already existing one. All treatments carry the risk of possible complications. The fact that a side effects or complications occurs, does not imply that the treatment was conducted incorrectly. It must be clearly understood that these can happen even when everything is done following the highest safety standards.  No treatment: You can choose not to proceed with the proposed treatment alternative. The "PRO(s)" would include: avoiding the risk of complications associated with the therapy. The "CON(s)" would include: not getting any of the treatment benefits. These benefits fall under one of three categories: diagnostic; therapeutic; and/or palliative. Diagnostic benefits include: getting information which can ultimately lead to improvement of the disease or symptom(s). Therapeutic benefits are those associated with the successful  treatment of the disease. Finally, palliative benefits are those related to the decrease of the primary symptoms, without necessarily curing the condition (example: decreasing the pain from a flare-up of a chronic condition, such as incurable terminal cancer).  General Risks and Complications: These are associated to most interventional treatments. They can occur alone, or in combination. They fall under one of the following six (6) categories: no benefit or worsening of symptoms; bleeding; infection; nerve damage; allergic reactions; and/or death. No benefits or worsening of symptoms: In Medicine there are no guarantees, only probabilities. No healthcare provider can ever guarantee that a medical treatment will work, they can only state the probability that it may. Furthermore, there is always the possibility that the condition may worsen, either directly, or indirectly, as a consequence of the treatment. Bleeding: This is more common if the patient is taking a blood thinner, either prescription or over the counter (example: Goody Powders, Fish oil, Aspirin, Garlic, etc.), or if suffering a condition associated with impaired coagulation (example: Hemophilia, cirrhosis of the liver, low platelet counts, etc.). However, even if you do not have one on these, it can still happen. If you have any of these conditions, or take one of these drugs, make sure to notify your treating physician. Infection: This is more common in patients with a compromised immune system, either due to disease (example: diabetes, cancer, human immunodeficiency virus [HIV], etc.), or due to medications or treatments (example: therapies used to treat cancer and rheumatological diseases). However, even if you do not have one on these, it can still happen. If you have any of these conditions, or take one of these drugs, make sure to notify your treating physician. Nerve Damage: This is more common when the treatment is  an invasive one, but it  can also happen with the use of medications, such as those used in the treatment of cancer. The damage can occur to small secondary nerves, or to large primary ones, such as those in the spinal cord and brain. This damage may be temporary or permanent and it may lead to impairments that can range from temporary numbness to permanent paralysis and/or brain death. Allergic Reactions: Any time a substance or material comes in contact with our body, there is the possibility of an allergic reaction. These can range from a mild skin rash (contact dermatitis) to a severe systemic reaction (anaphylactic reaction), which can result in death. Death: In general, any medical intervention can result in death, most of the time due to an unforeseen complication. ______________________________________________________________________      ______________________________________________________________________    Preparing for your procedure  Appointments: If you think you may not be able to keep your appointment, call 24-48 hours in advance to cancel. We need time to make it available to others.  Procedure visits are for procedures only. During your procedure appointment there will be: NO Prescription Refills*. NO medication changes or discussions*. NO discussion of disability issues*. NO unrelated pain problem evaluations*. NO evaluations to order other pain procedures*. *These will be addressed at a separate and distinct evaluation encounter on the provider's evaluation schedule and not during procedure days.  Instructions: Food intake: Avoid eating anything solid for at least 8 hours prior to your procedure. Clear liquid intake: You may take clear liquids such as water up to 2 hours prior to your procedure. (No carbonated drinks. No soda.) Transportation: Unless otherwise stated by your physician, bring a driver. (Driver cannot be a Market researcher, Pharmacist, community, or any other form of public transportation.) Morning  Medicines: Except for blood thinners, take all of your other morning medications with a sip of water. Make sure to take your heart and blood pressure medicines. If your blood pressure's lower number is above 100, the case will be rescheduled. Blood thinners: Make sure to stop your blood thinners as instructed.  If you take a blood thinner, but were not instructed to stop it, call our office [phone] and ask to talk to a nurse. Not stopping a blood thinner prior to certain procedures could lead to serious complications. Diabetics on insulin: Notify the staff so that you can be scheduled 1st case in the morning. If your diabetes requires high dose insulin, take only  of your normal insulin dose the morning of the procedure and notify the staff that you have done so. Preventing infections: Shower with an antibacterial soap the morning of your procedure.  Build-up your immune system: Take 1000 mg of Vitamin C with every meal (3 times a day) the day prior to your procedure. Antibiotics: Inform the nursing staff if you are taking any antibiotics or if you have any conditions that may require antibiotics prior to procedures. (Example: recent joint implants)   Pregnancy: If you are pregnant make sure to notify the nursing staff. Not doing so may result in injury to the fetus, including death.  Sickness: If you have a cold, fever, or any active infections, call and cancel or reschedule your procedure. Receiving steroids while having an infection may result in complications. Arrival: You must be in the facility at least 30 minutes prior to your scheduled procedure. Tardiness: Your scheduled time is also the cutoff time. If you do not arrive at least 15 minutes prior to your procedure, you will  be rescheduled.  Children: Do not bring any children with you. Make arrangements to keep them home. Dress appropriately: There is always a possibility that your clothing may get soiled. Avoid long dresses. Valuables:  Do not bring any jewelry or valuables.  Reasons to call and reschedule or cancel your procedure: (Following these recommendations will minimize the risk of a serious complication.) Surgeries: Avoid having procedures within 2 weeks of any surgery. (Avoid for 2 weeks before or after any surgery). Flu Shots: Avoid having procedures within 2 weeks of a flu shots or . (Avoid for 2 weeks before or after immunizations). Barium: Avoid having a procedure within 7-10 days after having had a radiological study involving the use of radiological contrast. (Myelograms, Barium swallow or enema study). Heart attacks: Avoid any elective procedures or surgeries for the initial 6 months after a "Myocardial Infarction" (Heart Attack). Blood thinners: It is imperative that you stop these medications before procedures. Let us know if you if you take any blood thinner.  Infection: Avoid procedures during or within two weeks of an infection (including chest colds or gastrointestinal problems). Symptoms associated with infections include: Localized redness, fever, chills, night sweats or profuse sweating, burning sensation when voiding, cough, congestion, stuffiness, runny nose, sore throat, diarrhea, nausea, vomiting, cold or Flu symptoms, recent or current infections. It is specially important if the infection is over the area that we intend to treat. Heart and lung problems: Symptoms that may suggest an active cardiopulmonary problem include: cough, chest pain, breathing difficulties or shortness of breath, dizziness, ankle swelling, uncontrolled high or unusually low blood pressure, and/or palpitations. If you are experiencing any of these symptoms, cancel your procedure and contact your primary care physician for an evaluation.  Remember:  Regular Business hours are:  Monday to Thursday 8:00 AM to 4:00 PM  Provider's Schedule: Delano Metz, MD:  Procedure days: Tuesday and Thursday 7:30 AM to 4:00 PM  Edward Jolly, MD:  Procedure days: Monday and Wednesday 7:30 AM to 4:00 PM Last  Updated: 09/01/2023 ______________________________________________________________________     Radiofrequency Ablation Radiofrequency ablation is a procedure that is performed to relieve pain. The procedure is often used for back, neck, or arm pain. Radiofrequency ablation involves the use of a machine that creates radio waves to make heat. During the procedure, the heat is applied to the nerve that carries the pain signal. The heat damages the nerve and interferes with the pain signal. Pain relief usually starts about 2 weeks after the procedure and lasts for 6 months to 1 year. Tell a health care provider about: Any allergies you have. All medicines you are taking, including vitamins, herbs, eye drops, creams, and over-the-counter medicines. Any problems you or family members have had with anesthetic medicines. Any bleeding problems you have. Any surgeries you have had. Any medical conditions you have. Whether you are pregnant or may be pregnant. What are the risks? Generally, this is a safe procedure. However, problems may occur, including: Pain or soreness at the injection site. Allergic reaction to medicines given during the procedure. Bleeding. Infection at the injection site. Damage to nerves or blood vessels. What happens before the procedure? When to stop eating and drinking Follow instructions from your health care provider about what you may eat and drink before your procedure. These may include: 8 hours before the procedure Stop eating most foods. Do not eat meat, fried foods, or fatty foods. Eat only light foods, such as toast or crackers. All liquids are okay except  energy drinks and alcohol. 6 hours before the procedure Stop eating. Drink only clear liquids, such as water, clear fruit juice, black coffee, plain tea, and sports drinks. Do not drink energy drinks or alcohol. 2 hours before the  procedure Stop drinking all liquids. You may be allowed to take medicine with small sips of water. If you do not follow your health care provider's instructions, your procedure may be delayed or canceled. Medicines Ask your health care provider about: Changing or stopping your regular medicines. This is especially important if you are taking diabetes medicines or blood thinners. Taking medicines such as aspirin and ibuprofen. These medicines can thin your blood. Do not take these medicines unless your health care provider tells you to take them. Taking over-the-counter medicines, vitamins, herbs, and supplements. General instructions Ask your health care provider what steps will be taken to help prevent infection. These steps may include: Removing hair at the procedure site. Washing skin with a germ-killing soap. Taking antibiotic medicine. If you will be going home right after the procedure, plan to have a responsible adult: Take you home from the hospital or clinic. You will not be allowed to drive. Care for you for the time you are told. What happens during the procedure?  You will be awake during the procedure. You will need to be able to talk with the health care provider during the procedure. An IV will be inserted into one of your veins. You will be given one or more of the following: A medicine to help you relax (sedative). A medicine to numb the area (local anesthetic). Your health care provider will insert a radiofrequency needle into the area to be treated. This is done with the help of fluoroscopy. A wire that carries the radio waves (electrode) will be put through the radiofrequency needle. An electrical pulse will be sent through the electrode to verify the correct nerve that is causing your pain. You will feel a tingling sensation, and you may have muscle twitching. The tissue around the needle tip will be heated by an electric current that comes from the radiofrequency  machine. This will numb the nerves. The needle will be removed. A bandage (dressing) will be put on the insertion area. The procedure may vary among health care providers and hospitals. What happens after the procedure? Your blood pressure, heart rate, breathing rate, and blood oxygen level will be monitored until you leave the hospital or clinic. Return to your normal activities as told by your health care provider. Ask your health care provider what activities are safe for you. If you were given a sedative during the procedure, it can affect you for several hours. Do not drive or operate machinery until your health care provider says that it is safe. Summary Radiofrequency ablation is a procedure that is performed to relieve pain. The procedure is often used for back, neck, or arm pain. Radiofrequency ablation involves the use of a machine that creates radio waves to make heat. Plan to have a responsible adult take you home from the hospital or clinic. Do not drive or operate machinery until your health care provider says that it is safe. Return to your normal activities as told by your health care provider. Ask your health care provider what activities are safe for you. This information is not intended to replace advice given to you by your health care provider. Make sure you discuss any questions you have with your health care provider. Document Revised: 02/26/2021 Document Reviewed: 02/26/2021  [employer]vier Patient Education  2024 [employer].

## 2024-03-10 NOTE — Progress Notes (Addendum)
 PROVIDER NOTE: Interpretation of information contained herein should be left to medically-trained personnel. Specific patient instructions are provided elsewhere under Patient Instructions section of medical record. This document was created in part using AI and STT-dictation technology, any transcriptional errors that may result from this process are unintentional.  Patient: [name]  Service: E/M   PCP: Connie Severs, PA-C  DOB: [birth year]  DOS: 03/10/2024  Provider: Cephus Collin, MD  MRN: [id number]  Delivery: Face-to-face  Specialty: Interventional Pain Management  Type: Established Patient  Setting: Ambulatory outpatient facility  Specialty designation: 09  Referring Prov.: [name], [name], [name]  Location: Outpatient office facility       History of present illness (HPI) [name], a 58 y.o. year old female, is here today because of her Lumbar facet arthropathy [M47.816]. [name] primary complain today is Back Pain (Lumbar bilateral )  Pertinent problems: [name] has Lumbar facet arthropathy; Chronic pain syndrome; and HTN (hypertension) on their pertinent problem list.  Pain Assessment: Severity of Chronic pain is reported as a 5 /10. Location: Back Lower, Left, Right/denies. Onset: More than a month ago. Quality: Discomfort, Pressure, Constant. Timing: Constant. Modifying factor(s): rest and reclining, tylenol . Vitals:  height is 5' 2.5 (1.588 m) and weight is 215 lb (97.5 kg). Her temporal temperature is 98.4 F (36.9 C). Her blood pressure is 119/82 and her pulse is 69. Her respiration is 16 and oxygen saturation is 100%.  BMI: Estimated body mass index is 38.7 kg/m as calculated from the following:   Height as of this encounter: 5' 2.5 (1.588 m).   Weight as of this encounter: 215 lb (97.5 kg).  Last encounter: 07/01/2023. Last procedure: 06/03/2023.  Reason for encounter:   Patient presents today with increased lumbar spine pain bilaterally  related to lumbar spondylosis.  She is status post 2 positive diagnostic lumbar facet medial branch nerve blocks bilaterally at L3, L4, L5 that provided her with 80% pain relief which were done on 02/25/2023 and 06/03/2023 respectively.  The duration of this pain relief was 6 weeks for her first block and 8 weeks for her second block.  She is interested in moving forward with lumbar radiofrequency ablation for longer-term pain relief.  Risks and benefits reviewed and patient would like to proceed.  Of note, the patient has done physical therapy in the past and does home stretching exercises that have been provided to her by me in the past  ROS  Constitutional: Denies any fever or chills Gastrointestinal: No reported hemesis, hematochezia, vomiting, or acute GI distress Musculoskeletal: Positive lumbar spine pain Neurological: No reported episodes of acute onset apraxia, aphasia, dysarthria, agnosia, amnesia, paralysis, loss of coordination, or loss of consciousness  Medication Review  Acetaminophen , Potassium Gluconate, albuterol , budesonide -formoterol , cholecalciferol, diclofenac , famotidine , levothyroxine , olmesartan -hydrochlorothiazide , ondansetron , pantoprazole , pravastatin , and rosuvastatin   History Review  Allergy: [name] is allergic to penicillins. Drug: [name]  reports no history of drug use. Alcohol:  reports current alcohol use. Tobacco:  reports that she has never smoked. She has never used smokeless tobacco. Social: [name]  reports that she has never smoked. She has never used smokeless tobacco. She reports current alcohol use. She reports that she does not use drugs. Medical:  has a past medical history of Arthritis, Back pain (10/30/2015), Bilateral shoulder pain (10/30/2015), COPD (chronic obstructive pulmonary disease) (HCC), Fecal occult blood test positive (10/30/2015), Hemorrhoids (10/30/2015), Hypertension, Hypothyroidism, Large breasts (10/30/2015), Postmenopausal (10/30/2015),  Sleep apnea, and Thyroid  disease. Surgical: [name]  has a past surgical history that includes Nasal sinus surgery; Knee arthroscopy with medial menisectomy (Left, 05/04/2015); Colonoscopy (N/A, 11/12/2015); Total knee arthroplasty (Left, 01/29/2017); Joint replacement; Rotator cuff repair; Shoulder surgery (Right, 10/12/2018); Shoulder arthroscopy (Right, 10/12/2018); and Breast reduction surgery (Bilateral, 04/17/2020). Family: family history includes Alzheimer's disease in her mother; Breast cancer in her cousin and maternal aunt; Cancer in her brother and father; Diabetes in her brother; Heart attack in her brother; Heart disease in her brother, maternal grandfather, and maternal grandmother; Hypertension in her maternal grandfather; Lupus in her maternal grandmother.  Laboratory Chemistry Profile   Renal Lab Results  Component Value Date   BUN 25 (H) 02/04/2024   CREATININE 1.03 (H) 02/04/2024   BCR 24 (H) 02/04/2024   GFRAA >60 04/16/2020   GFRNONAA >60 06/09/2023    Hepatic Lab Results  Component Value Date   AST 23 02/04/2024   ALT 21 02/04/2024   ALBUMIN 4.1 02/04/2024   ALKPHOS 127 (H) 02/04/2024   AMYLASE 32 06/06/2008   LIPASE 28 05/13/2022    Electrolytes Lab Results  Component Value Date   NA 137 02/04/2024   K 4.3 02/04/2024   CL 97 02/04/2024   CALCIUM  9.3 02/04/2024    Bone Lab Results  Component Value Date   VD25OH 32 06/15/2018    Inflammation (CRP: Acute Phase) (ESR: Chronic Phase) Lab Results  Component Value Date   ESRSEDRATE 17 06/15/2018         Note: Above Lab results reviewed.  Recent Imaging Review  MM 3D SCREENING MAMMOGRAM BILATERAL BREAST CLINICAL DATA:  Screening.  EXAM: DIGITAL SCREENING BILATERAL MAMMOGRAM WITH TOMOSYNTHESIS AND CAD  TECHNIQUE: Bilateral screening digital craniocaudal and mediolateral oblique mammograms were obtained. Bilateral screening digital breast tomosynthesis was performed. The images were evaluated  with computer-aided detection.  COMPARISON:  Previous exam(s).  ACR Breast Density Category a: The breasts are almost entirely fatty.  FINDINGS: There are no findings suspicious for malignancy.  IMPRESSION: No mammographic evidence of malignancy. A result letter of this screening mammogram will be mailed directly to the patient.  RECOMMENDATION: Screening mammogram in one year. (Code:[id number]-B-01Y)  BI-RADS CATEGORY  1: Negative.  Electronically Signed   By: Amanda Jungling M.D.   On: 03/01/2024 13:25  MR Lumbar Spine Wo Contrast   Narrative CLINICAL DATA:  Lumbar spinal stenosis. Low back pain radiating to the right hip and knee.   EXAM: MRI LUMBAR SPINE WITHOUT CONTRAST   TECHNIQUE: Multiplanar, multisequence MR imaging of the lumbar spine was performed. No intravenous contrast was administered.   COMPARISON:  CT abdomen and pelvis 05/13/2022   FINDINGS: Segmentation:  Standard.   Alignment: Facet mediated anterolisthesis of L4 on L5 and L5 on S1 measuring 2 and 3 mm, respectively.   Vertebrae: No fracture or suspicious marrow lesion. Mild right facet edema at L4-5.   Conus medullaris and cauda equina: Conus extends to the T12 level. Conus and cauda equina appear normal.   Paraspinal and other soft tissues: Unremarkable.   Disc levels:   Disc desiccation and up to mild disc space narrowing throughout the lumbar spine.   T12-L1: Only imaged sagittally and negative.   L1-2: Mild disc bulging and mild facet hypertrophy without stenosis.   L2-3: Disc bulging and mild-to-moderate facet hypertrophy without stenosis.   L3-4: Disc bulging, endplate spurring, and mild-to-moderate facet hypertrophy result in mild left neural foraminal stenosis without spinal stenosis.   L4-5: Anterolisthesis with bulging uncovered disc and moderate to severe facet hypertrophy without stenosis.  L5-S1: Anterolisthesis with disc uncovering and severe facet hypertrophy without  stenosis.   IMPRESSION: 1. Mild diffuse disc degeneration and advanced lower lumbar facet arthrosis without spinal stenosis. 2. Mild left neural foraminal stenosis at L3-4.     Electronically Signed By: Aundra Lee M.D. On: 11/15/2022 11:15 Note: Reviewed        Note: Reviewed        Physical Exam  General appearance: Well nourished, well developed, and well hydrated. In no apparent acute distress Mental status: Alert, oriented x 3 (person, place, & time)       Respiratory: No evidence of acute respiratory distress Eyes: PERLA Vitals: BP 119/82 (BP Location: Left Arm, Patient Position: Sitting, Cuff Size: Large)   Pulse 69   Temp 98.4 F (36.9 C) (Temporal)   Resp 16   Ht 5' 2.5 (1.588 m)   Wt 215 lb (97.5 kg)   LMP 04/14/2011   SpO2 100%   BMI 38.70 kg/m  BMI: Estimated body mass index is 38.7 kg/m as calculated from the following:   Height as of this encounter: 5' 2.5 (1.588 m).   Weight as of this encounter: 215 lb (97.5 kg). Ideal: Ideal body weight: 51.3 kg (112 lb 15.8 oz) Adjusted ideal body weight: 69.8 kg (153 lb 12.7 oz)  Lumbar Spine Area Exam  Skin & Axial Inspection: No masses, redness, or swelling Alignment: Symmetrical Functional ROM: Pain restricted ROM affecting both sides Stability: No instability detected Muscle Tone/Strength: Functionally intact. No obvious neuro-muscular anomalies detected. Sensory (Neurological): Musculoskeletal pain pattern, facet mediated Palpation: Complains of area being tender to palpation       Provocative Tests: Hyperextension/rotation test: (+) bilaterally for facet joint pain. Lumbar quadrant test (Kemp's test): (+) bilaterally for facet joint pain.   Gait & Posture Assessment  Ambulation: Unassisted Gait: Relatively normal for age and body habitus Posture: WNL  Lower Extremity Exam      Side: Right lower extremity   Side: Left lower extremity  Stability: No instability observed           Stability: No  instability observed          Skin & Extremity Inspection: Skin color, temperature, and hair growth are WNL. No peripheral edema or cyanosis. No masses, redness, swelling, asymmetry, or associated skin lesions. No contractures.   Skin & Extremity Inspection: Evidence of prior arthroplastic surgery  Functional ROM: Pain restricted ROM                   Functional ROM: Mechanically restricted ROM                  Muscle Tone/Strength: Functionally intact. No obvious neuro-muscular anomalies detected.   Muscle Tone/Strength: Functionally intact. No obvious neuro-muscular anomalies detected.  Sensory (Neurological): Unimpaired         Sensory (Neurological): Unimpaired        DTR: Patellar: deferred today Achilles: deferred today Plantar: deferred today   DTR: Patellar: deferred today Achilles: deferred today Plantar: deferred today  Palpation: No palpable anomalies   Palpation: No palpable anomalies     Assessment   Diagnosis Status  1. Lumbar facet arthropathy   2. Lumbar spondylosis   3. Chronic pain syndrome    Controlled Controlled Controlled   Updated Problems: Problem  Lumbar Facet Arthropathy    Plan of Care  Discussed lumbar radiofrequency ablation for the purpose of obtaining longer-term pain relief for management of low back pain related to lumbar facet arthropathy  and lumbar spondylosis.  Patient is status post 2 positive diagnostic lumbar facet medial branch nerve blocks on 02/25/2023 and 06/03/2023 that provided her with 80% pain relief for at least 4 weeks on both occasions.  Risks and benefits reviewed for lumbar radiofrequency ablation and patient would like to proceed.   Orders:  Orders Placed This Encounter  Procedures   Radiofrequency,Lumbar    Standing Status:   Future    Expected Date:   04/06/2024    Expiration Date:   03/10/2025    Scheduling Instructions:     Side(s): Bilateral     Level(s): L3, L4, L5, Medial Branch Nerve(s)     Sedation: With Sedation      Scheduling Timeframe: As soon as pre-approved    Where will this procedure be performed?:   [facility]     B/L L3,4,5 MBNB #1 02/25/23, 06/03/23: 80% pain relief for at least 4 weeks    Return in about 27 days (around 04/06/2024) for B/L L3, 4, 5 RFA, ECT.    Recent Visits No visits were found meeting these conditions. Showing recent visits within past 90 days and meeting all other requirements Today's Visits Date Type Provider Dept  03/10/24 Office Visit Connie Collin, MD [facility]  Showing today's visits and meeting all other requirements Future Appointments No visits were found meeting these conditions. Showing future appointments within next 90 days and meeting all other requirements  I discussed the assessment and treatment plan with the patient. The patient was provided an opportunity to ask questions and all were answered. The patient agreed with the plan and demonstrated an understanding of the instructions.  Patient advised to call back or seek an in-person evaluation if the symptoms or condition worsens.  Duration of encounter: 30 minutes.  Total time on encounter, as per AMA guidelines included both the face-to-face and non-face-to-face time personally spent by the physician and/or other qualified health care professional(s) on the day of the encounter (includes time in activities that require the physician or other qualified health care professional and does not include time in activities normally performed by clinical staff). Physician's time may include the following activities when performed: Preparing to see the patient (e.g., pre-charting review of records, searching for previously ordered imaging, lab work, and nerve conduction tests) Review of prior analgesic pharmacotherapies. Reviewing PMP Interpreting ordered tests (e.g., lab work, imaging, nerve conduction tests) Performing post-procedure evaluations, including interpretation of diagnostic  procedures Obtaining and/or reviewing separately obtained history Performing a medically appropriate examination and/or evaluation Counseling and educating the patient/family/caregiver Ordering medications, tests, or procedures Referring and communicating with other health care professionals (when not separately reported) Documenting clinical information in the electronic or other health record Independently interpreting results (not separately reported) and communicating results to the patient/ family/caregiver Care coordination (not separately reported)  Note by: Connie Collin, MD (TTS and AI technology used. I apologize for any typographical errors that were not detected and corrected.) Date: 03/10/2024; Time: 2:44 PM

## 2024-03-10 NOTE — Progress Notes (Signed)
 Safety precautions to be maintained throughout the outpatient stay will include: orient to surroundings, keep bed in low position, maintain call bell within reach at all times, provide assistance with transfer out of bed and ambulation.

## 2024-03-13 ENCOUNTER — Ambulatory Visit: Payer: Self-pay | Admitting: Internal Medicine

## 2024-03-14 NOTE — Progress Notes (Signed)
Spoke with pt and notified of results per Dr. Wert. Pt verbalized understanding and denied any questions. 

## 2024-03-17 ENCOUNTER — Telehealth: Payer: Self-pay

## 2024-03-17 NOTE — Telephone Encounter (Addendum)
 The RFA is pending denial. They are asking for MBNB within the past 6 months, and hers were last year. I will let you know once I receive the denial letter. We may have to repeat diagnostic facets.   They approved it. I will call patient to schedule.

## 2024-03-22 HISTORY — PX: OTHER SURGICAL HISTORY: SHX169

## 2024-03-29 NOTE — Progress Notes (Signed)
Spoke with pt and notified of results per Dr. Wert. Pt verbalized understanding and denied any questions. 

## 2024-03-30 ENCOUNTER — Ambulatory Visit
Admission: RE | Admit: 2024-03-30 | Discharge: 2024-03-30 | Disposition: A | Discharge status: Home / Self Care | Source: Ambulatory Visit | Attending: Student in an Organized Health Care Education/Training Program | Admitting: Student in an Organized Health Care Education/Training Program

## 2024-03-30 ENCOUNTER — Encounter: Payer: Self-pay | Admitting: Student in an Organized Health Care Education/Training Program

## 2024-03-30 ENCOUNTER — Ambulatory Visit (HOSPITAL_BASED_OUTPATIENT_CLINIC_OR_DEPARTMENT_OTHER): Admitting: Student in an Organized Health Care Education/Training Program

## 2024-03-30 DIAGNOSIS — G894 Chronic pain syndrome: Secondary | ICD-10-CM | POA: Insufficient documentation

## 2024-03-30 DIAGNOSIS — M47816 Spondylosis without myelopathy or radiculopathy, lumbar region: Secondary | ICD-10-CM

## 2024-03-30 MED ORDER — MIDAZOLAM HCL 5 MG/5ML IJ SOLN
INTRAMUSCULAR | Status: AC
  Filled 2024-03-30: qty 5

## 2024-03-30 MED ORDER — MIDAZOLAM HCL 5 MG/5ML IJ SOLN
0.5000 mg | Freq: Once | INTRAMUSCULAR | Status: AC
  Administered 2024-03-30: 2 mg via INTRAVENOUS

## 2024-03-30 MED ORDER — ROPIVACAINE HCL 2 MG/ML IJ SOLN
INTRAMUSCULAR | Status: AC
  Filled 2024-03-30: qty 20

## 2024-03-30 MED ORDER — LACTATED RINGERS IV SOLN: Freq: Once | INTRAVENOUS | Status: AC

## 2024-03-30 MED ORDER — FENTANYL CITRATE (PF) 100 MCG/2ML IJ SOLN
INTRAMUSCULAR | Status: AC
  Filled 2024-03-30: qty 2

## 2024-03-30 MED ORDER — DEXAMETHASONE SODIUM PHOSPHATE 10 MG/ML IJ SOLN
INTRAMUSCULAR | Status: AC
  Filled 2024-03-30: qty 1

## 2024-03-30 MED ORDER — LIDOCAINE HCL 2 % IJ SOLN
20.0000 mL | Freq: Once | INTRAMUSCULAR | Status: AC
  Administered 2024-03-30: 400 mg

## 2024-03-30 MED ORDER — LIDOCAINE HCL 2 % IJ SOLN
INTRAMUSCULAR | Status: AC
  Filled 2024-03-30: qty 20

## 2024-03-30 MED ORDER — ROPIVACAINE HCL 2 MG/ML IJ SOLN
18.0000 mL | Freq: Once | INTRAMUSCULAR | Status: AC
  Administered 2024-03-30: 18 mL via PERINEURAL

## 2024-03-30 MED ORDER — DEXAMETHASONE SODIUM PHOSPHATE 10 MG/ML IJ SOLN
20.0000 mg | Freq: Once | INTRAMUSCULAR | Status: AC
  Administered 2024-03-30: 20 mg

## 2024-03-30 MED ORDER — FENTANYL CITRATE (PF) 100 MCG/2ML IJ SOLN
25.0000 ug | INTRAMUSCULAR | Status: DC | PRN
  Administered 2024-03-30: 50 ug via INTRAVENOUS

## 2024-03-30 NOTE — Patient Instructions (Signed)

## 2024-03-30 NOTE — Progress Notes (Signed)
 PROVIDER NOTE: Interpretation of information contained herein should be left to medically-trained personnel. Specific patient instructions are provided elsewhere under Patient Instructions section of medical record. This document was created in part using STT-dictation technology, any transcriptional errors that may result from this process are unintentional.  Patient: [name] Type: Established DOB: 12-27-1965 MRN: [id number] PCP: Mancil Pfeiffer, PA-C  Service: Procedure DOS: 03/30/2024 Setting: Ambulatory Location: Ambulatory outpatient facility Delivery: Face-to-face Provider: Wallie Sherry, MD Specialty: Interventional Pain Management Specialty designation: 09 Location: Outpatient facility Ref. Prov.: Sherry Wallie, MD       Interventional Therapy   Procedure: Lumbar Facet, Medial Branch Radiofrequency Ablation (RFA) #1  Laterality: Bilateral (-50)  Level: L3, L4, and L5 Medial Branch Level(s). These levels will denervate the L3-4 and L4-5 lumbar facet joints.  Imaging: Fluoroscopy-guided         Anesthesia: Local anesthesia (1-2% Lidocaine ) Sedation: Moderate Sedation                       DOS: 03/30/2024  Performed by: Wallie Sherry, MD  Purpose: Therapeutic/Palliative Indications: Low back pain severe enough to impact quality of life or function. Indications: 1. Lumbar facet arthropathy   2. Lumbar spondylosis   3. Chronic pain syndrome    [name] has been dealing with the above chronic pain for longer than three months and has either failed to respond, was unable to tolerate, or simply did not get enough benefit from other more conservative therapies including, but not limited to: 1. Over-the-counter medications 2. Anti-inflammatory medications 3. Muscle relaxants 4. Membrane stabilizers 5. Opioids 6. Physical therapy and/or chiropractic manipulation 7. Modalities (Heat, ice, etc.) 8. Invasive techniques such as nerve blocks. [name] has attained more  than 50% relief of the pain from a series of diagnostic injections conducted in separate occasions.  Pain Score: Pre-procedure: 7 /10 Post-procedure: 3 /10     Position / Prep / Materials:  Position: Prone  Prep solution: ChloraPrep (2% chlorhexidine  gluconate and 70% isopropyl alcohol) Prep Area: Entire Lumbosacral Region (Lower back from mid-thoracic region to end of tailbone and from flank to flank.) Materials:  Tray: RFA (Radiofrequency) tray Needle(s):  Type: RFA (Teflon-coated radiofrequency ablation needles) Gauge (G): 22  Length: Regular (10cm) Qty: 3     H&P (Pre-op Assessment):  [name] is a 58 y.o. (year old), female patient, seen today for interventional treatment. She  has a past surgical history that includes Nasal sinus surgery; Knee arthroscopy with medial menisectomy (Left, 05/04/2015); Colonoscopy (N/A, 11/12/2015); Total knee arthroplasty (Left, 01/29/2017); Joint replacement; Rotator cuff repair; Shoulder surgery (Right, 10/12/2018); Shoulder arthroscopy (Right, 10/12/2018); and Breast reduction surgery (Bilateral, 04/17/2020). [name] has a current medication list which includes the following prescription(s): acetaminophen , albuterol , budesonide -formoterol , cholecalciferol, diclofenac , famotidine , levothyroxine , olmesartan -hydrochlorothiazide , ondansetron , pantoprazole , potassium gluconate, pravastatin , and rosuvastatin , and the following Facility-Administered Medications: fentanyl . Her primarily concern today is the Back Pain (lower)  Initial Vital Signs:  Pulse/HCG Rate: 72ECG Heart Rate: 77 (NSR) Temp: 98.1 F (36.7 C) Resp: 18 BP: 127/82 SpO2: 99 %  BMI: Estimated body mass index is 38.09 kg/m as calculated from the following:   Height as of this encounter: 5' 3 (1.6 m).   Weight as of this encounter: 215 lb (97.5 kg).  Risk Assessment: Allergies: Reviewed. She is allergic to penicillins.  Allergy Precautions: None required Coagulopathies: Reviewed.  None identified.  Blood-thinner therapy: None at this time Active Infection(s): Reviewed. None identified. [name] is afebrile  Site Confirmation:  [name] was asked to confirm the procedure and laterality before marking the site Procedure checklist: Completed Consent: Before the procedure and under the influence of no sedative(s), amnesic(s), or anxiolytics, the patient was informed of the treatment options, risks and possible complications. To fulfill our ethical and legal obligations, as recommended by the American Medical Association's Code of Ethics, I have informed the patient of my clinical impression; the nature and purpose of the treatment or procedure; the risks, benefits, and possible complications of the intervention; the alternatives, including doing nothing; the risk(s) and benefit(s) of the alternative treatment(s) or procedure(s); and the risk(s) and benefit(s) of doing nothing. The patient was provided information about the general risks and possible complications associated with the procedure. These may include, but are not limited to: failure to achieve desired goals, infection, bleeding, organ or nerve damage, allergic reactions, paralysis, and death. In addition, the patient was informed of those risks and complications associated to Spine-related procedures, such as failure to decrease pain; infection (i.e.: Meningitis, epidural or intraspinal abscess); bleeding (i.e.: epidural hematoma, subarachnoid hemorrhage, or any other type of intraspinal or peri-dural bleeding); organ or nerve damage (i.e.: Any type of peripheral nerve, nerve root, or spinal cord injury) with subsequent damage to sensory, motor, and/or autonomic systems, resulting in permanent pain, numbness, and/or weakness of one or several areas of the body; allergic reactions; (i.e.: anaphylactic reaction); and/or death. Furthermore, the patient was informed of those risks and complications associated with the  medications. These include, but are not limited to: allergic reactions (i.e.: anaphylactic or anaphylactoid reaction(s)); adrenal axis suppression; blood sugar elevation that in diabetics may result in ketoacidosis or comma; water retention that in patients with history of congestive heart failure may result in shortness of breath, pulmonary edema, and decompensation with resultant heart failure; weight gain; swelling or edema; medication-induced neural toxicity; particulate matter embolism and blood vessel occlusion with resultant organ, and/or nervous system infarction; and/or aseptic necrosis of one or more joints. Finally, the patient was informed that Medicine is not an exact science; therefore, there is also the possibility of unforeseen or unpredictable risks and/or possible complications that may result in a catastrophic outcome. The patient indicated having understood very clearly. We have given the patient no guarantees and we have made no promises. Enough time was given to the patient to ask questions, all of which were answered to the patient's satisfaction. Ms. Brion has indicated that she wanted to continue with the procedure. Attestation: I, the ordering provider, attest that I have discussed with the patient the benefits, risks, side-effects, alternatives, likelihood of achieving goals, and potential problems during recovery for the procedure that I have provided informed consent. Date  Time: 03/30/2024 10:37 AM  Pre-Procedure Preparation:  Monitoring: As per clinic protocol. Respiration, ETCO2, SpO2, BP, heart rate and rhythm monitor placed and checked for adequate function Safety Precautions: Patient was assessed for positional comfort and pressure points before starting the procedure. Time-out: I initiated and conducted the Time-out before starting the procedure, as per protocol. The patient was asked to participate by confirming the accuracy of the Time Out information. Verification of  the correct person, site, and procedure were performed and confirmed by me, the nursing staff, and the patient. Time-out conducted as per Joint Commission's Universal Protocol (UP.01.01.01). Time: 1146 Start Time: 1146 hrs.  Description of Procedure:          Laterality: See above. Levels:  See above. Safety Precautions: Aspiration looking for blood return was conducted prior to  all injections. At no point did we inject any substances, as a needle was being advanced. Before injecting, the patient was told to immediately notify me if she was experiencing any new onset of ringing in the ears, or metallic taste in the mouth. No attempts were made at seeking any paresthesias. Safe injection practices and needle disposal techniques used. Medications properly checked for expiration dates. SDV (single dose vial) medications used. After the completion of the procedure, all disposable equipment used was discarded in the proper designated medical waste containers. Local Anesthesia: Protocol guidelines were followed. The patient was positioned over the fluoroscopy table. The area was prepped in the usual manner. The time-out was completed. The target area was identified using fluoroscopy. A 12-in long, straight, sterile hemostat was used with fluoroscopic guidance to locate the targets for each level blocked. Once located, the skin was marked with an approved surgical skin marker. Once all sites were marked, the skin (epidermis, dermis, and hypodermis), as well as deeper tissues (fat, connective tissue and muscle) were infiltrated with a small amount of a short-acting local anesthetic, loaded on a 10cc syringe with a 25G, 1.5-in  Needle. An appropriate amount of time was allowed for local anesthetics to take effect before proceeding to the next step. Technical description of process:  Radiofrequency Ablation (RFA)  L3 Medial Branch Nerve RFA: The target area for the L3 medial branch is at the junction of the  postero-lateral aspect of the superior articular process and the superior, posterior, and medial edge of the transverse process of L4. Under fluoroscopic guidance, a Radiofrequency needle was inserted until contact was made with os over the superior postero-lateral aspect of the pedicular shadow (target area). Sensory and motor testing was conducted to properly adjust the position of the needle. Once satisfactory placement of the needle was achieved, the numbing solution was slowly injected after negative aspiration for blood. 2.0 mL of the nerve block solution was injected without difficulty or complication. After waiting for at least 3 minutes, the ablation was performed. Once completed, the needle was removed intact. L4 Medial Branch Nerve RFA: The target area for the L4 medial branch is at the junction of the postero-lateral aspect of the superior articular process and the superior, posterior, and medial edge of the transverse process of L5. Under fluoroscopic guidance, a Radiofrequency needle was inserted until contact was made with os over the superior postero-lateral aspect of the pedicular shadow (target area). Sensory and motor testing was conducted to properly adjust the position of the needle. Once satisfactory placement of the needle was achieved, the numbing solution was slowly injected after negative aspiration for blood. 2.0 mL of the nerve block solution was injected without difficulty or complication. After waiting for at least 3 minutes, the ablation was performed. Once completed, the needle was removed intact. L5 Medial Branch Nerve RFA: The target area for the L5 medial branch is at the junction of the postero-lateral aspect of the superior articular process of S1 and the superior, posterior, and medial edge of the sacral ala. Under fluoroscopic guidance, a Radiofrequency needle was inserted until contact was made with os over the superior postero-lateral aspect of the pedicular shadow (target  area). Sensory and motor testing was conducted to properly adjust the position of the needle. Once satisfactory placement of the needle was achieved, the numbing solution was slowly injected after negative aspiration for blood. 2.0 mL of the nerve block solution was injected without difficulty or complication. After waiting for at least 3  minutes, the ablation was performed. Once completed, the needle was removed intact. Radiofrequency lesioning (ablation):  Radiofrequency Generator: Medtronic AccurianTM AG 1000 RF Generator Sensory Stimulation Parameters: 50 Hz was used to locate & identify the nerve, making sure that the needle was positioned such that there was no sensory stimulation below 0.3 V or above 0.7 V. Motor Stimulation Parameters: 2 Hz was used to evaluate the motor component. Care was taken not to lesion any nerves that demonstrated motor stimulation of the lower extremities at an output of less than 2.5 times that of the sensory threshold, or a maximum of 2.0 V. Lesioning Technique Parameters: Standard Radiofrequency settings. (Not bipolar or pulsed.) Temperature Settings: 80 degrees C Lesioning time: 60 seconds Stationary intra-operative compliance: Compliant  Once the entire procedure was completed, the treated area was cleaned, making sure to leave some of the prepping solution back to take advantage of its long term bactericidal properties.    Illustration of the posterior view of the lumbar spine and the posterior neural structures. Laminae of L2 through S1 are labeled. DPRL5, dorsal primary ramus of L5; DPRS1, dorsal primary ramus of S1; DPR3, dorsal primary ramus of L3; FJ, facet (zygapophyseal) joint L3-L4; I, inferior articular process of L4; LB1, lateral branch of dorsal primary ramus of L1; IAB, inferior articular branches from L3 medial branch (supplies L4-L5 facet joint); IBP, intermediate branch plexus; MB3, medial branch of dorsal primary ramus of L3; NR3, third lumbar nerve  root; S, superior articular process of L5; SAB, superior articular branches from L4 (supplies L4-5 facet joint also); TP3, transverse process of L3.  Facet Joint Innervation (* possible contribution)  L1-2 T12, L1 (L2*)  Medial Branch  L2-3 L1, L2 (L3*)                     L3-4 L2, L3 (L4*)                     L4-5 L3, L4 (L5*)                     L5-S1 L4, L5, S1                        Vitals:   03/30/24 1200 03/30/24 1210 03/30/24 1220 03/30/24 1229  BP: 99/70 120/70 117/67 120/70  Pulse:      Resp: 18 18 12 10   Temp:      SpO2: 100% 100% 97% 100%  Weight:      Height:        Start Time: 1146 hrs. End Time: 1203 hrs.  Imaging Guidance (Spinal):         Type of Imaging Technique: Fluoroscopy Guidance (Spinal) Indication(s): Fluoroscopy guidance for needle placement to enhance accuracy in procedures requiring precise needle localization for targeted delivery of medication in or near specific anatomical locations not easily accessible without such real-time imaging assistance. Exposure Time: Please see nurses notes. Contrast: None used. Fluoroscopic Guidance: I was personally present during the use of fluoroscopy. Tunnel Vision Technique used to obtain the best possible view of the target area. Parallax error corrected before commencing the procedure. Direction-depth-direction technique used to introduce the needle under continuous pulsed fluoroscopy. Once target was reached, antero-posterior, oblique, and lateral fluoroscopic projection used confirm needle placement in all planes. Images permanently stored in EMR. Interpretation: No contrast injected. I personally interpreted the imaging intraoperatively. Adequate needle placement confirmed in multiple planes. Permanent images saved  into the patient's record.  Antibiotic Prophylaxis:   Anti-infectives (From admission, onward)    None      Indication(s): None identified  Post-operative Assessment:  Post-procedure  Vital Signs:  Pulse/HCG Rate: 7264 Temp: 98.1 F (36.7 C) Resp: 10 BP: 120/70 SpO2: 100 %  EBL: None  Complications: No immediate post-treatment complications observed by team, or reported by patient.  Note: The patient tolerated the entire procedure well. A repeat set of vitals were taken after the procedure and the patient was kept under observation following institutional policy, for this type of procedure. Post-procedural neurological assessment was performed, showing return to baseline, prior to discharge. The patient was provided with post-procedure discharge instructions, including a section on how to identify potential problems. Should any problems arise concerning this procedure, the patient was given instructions to immediately contact us , at any time, without hesitation. In any case, we plan to contact the patient by telephone for a follow-up status report regarding this interventional procedure.  Comments:  No additional relevant information.  Plan of Care (POC)  Orders:  Orders Placed This Encounter  Procedures   DG PAIN CLINIC C-ARM 1-60 MIN NO REPORT    Intraoperative interpretation by procedural physician at [facility].    Standing Status:   Standing    Number of Occurrences:   1    Reason for exam::   Assistance in needle guidance and placement for procedures requiring needle placement in or near specific anatomical locations not easily accessible without such assistance.    Medications ordered for procedure: Meds ordered this encounter  Medications   lidocaine  (XYLOCAINE ) 2 % (with pres) injection 400 mg   lactated ringers  infusion   midazolam  (VERSED ) 5 MG/5ML injection 0.5-2 mg    Make sure Flumazenil is available in the pyxis when using this medication. If oversedation occurs, administer 0.2 mg IV over 15 sec. If after 45 sec no response, administer 0.2 mg again over 1 min; may repeat at 1 min intervals; not to exceed 4 doses (1 mg)   fentaNYL   (SUBLIMAZE ) injection 25-50 mcg    Make sure Narcan is available in the pyxis when using this medication. In the event of respiratory depression (RR< 8/min): Titrate NARCAN (naloxone) in increments of 0.1 to 0.2 mg IV at 2-3 minute intervals, until desired degree of reversal.   ropivacaine  (PF) 2 mg/mL (0.2%) (NAROPIN ) injection 18 mL   dexamethasone  (DECADRON ) injection 20 mg   Medications administered: We administered lidocaine , lactated ringers , midazolam , fentaNYL , ropivacaine  (PF) 2 mg/mL (0.2%), and dexamethasone .  See the medical record for exact dosing, route, and time of administration.    B/L L3,4,5 MBNB #1 02/25/23, 06/03/23: 80% pain relief for at least 4 weeks; L3-5 RFA BL 03/30/24     Follow-up plan:   Return in about 8 weeks (around 05/25/2024) for PPE, F2F.     Recent Visits Date Type Provider Dept  03/10/24 Office Visit Marcelino Nurse, MD [facility]  Showing recent visits within past 90 days and meeting all other requirements Today's Visits Date Type Provider Dept  03/30/24 Procedure visit Marcelino Nurse, MD [facility]  Showing today's visits and meeting all other requirements Future Appointments Date Type Provider Dept  05/24/24 Appointment Marcelino Nurse, MD [facility]  Showing future appointments within next 90 days and meeting all other requirements   Disposition: Discharge home  Discharge (Date  Time): 03/30/2024;   hrs.   Primary Care Physician: Grooms, [place]armaine, [place] Location: [facility] Note by: Wallie Sherry, MD (TTS technology used. I apologize for any typographical errors that were not detected and corrected.) Date: 03/30/2024; Time: 1:27 PM  Disclaimer:  Medicine is not an Visual merchandiser. The only guarantee in medicine is that nothing is guaranteed. It is important to note that the decision to proceed with this intervention was based on the information collected from the patient. The Data and  conclusions were drawn from the patient's questionnaire, the interview, and the physical examination. Because the information was provided in large part by the patient, it cannot be guaranteed that it has not been purposely or unconsciously manipulated. Every effort has been made to obtain as much relevant data as possible for this evaluation. It is important to note that the conclusions that lead to this procedure are derived in large part from the available data. Always take into account that the treatment will also be dependent on availability of resources and existing treatment guidelines, considered by other Pain Management Practitioners as being common knowledge and practice, at the time of the intervention. For Medico-Legal purposes, it is also important to point out that variation in procedural techniques and pharmacological choices are the acceptable norm. The indications, contraindications, technique, and results of the above procedure should only be interpreted and judged by a Board-Certified Interventional Pain Specialist with extensive familiarity and expertise in the same exact procedure and technique.

## 2024-03-31 ENCOUNTER — Telehealth: Payer: Self-pay

## 2024-03-31 NOTE — Telephone Encounter (Signed)
 Called pp. States she is sore today. Instructed to call if needed.

## 2024-04-04 ENCOUNTER — Other Ambulatory Visit (HOSPITAL_COMMUNITY)
Admission: RE | Admit: 2024-04-04 | Discharge: 2024-04-04 | Disposition: A | Discharge status: Home / Self Care | Source: Ambulatory Visit | Attending: Adult Health | Admitting: Adult Health

## 2024-04-04 ENCOUNTER — Encounter: Payer: Self-pay | Admitting: Adult Health

## 2024-04-04 ENCOUNTER — Ambulatory Visit: Admitting: Adult Health

## 2024-04-04 VITALS — BP 111/71 | HR 76 | Ht 63.0 in | Wt 221.5 lb

## 2024-04-04 DIAGNOSIS — Z1331 Encounter for screening for depression: Secondary | ICD-10-CM

## 2024-04-04 DIAGNOSIS — Z1211 Encounter for screening for malignant neoplasm of colon: Secondary | ICD-10-CM | POA: Diagnosis not present

## 2024-04-04 DIAGNOSIS — Z0000 Encounter for general adult medical examination without abnormal findings: Secondary | ICD-10-CM

## 2024-04-04 LAB — HEMOCCULT GUIAC POC 1CARD (OFFICE): Fecal Occult Blood, POC: NEGATIVE

## 2024-04-04 NOTE — Progress Notes (Signed)
 Patient ID: [name], female   DOB: 01-26-1966, 58 y.o.   MRN: [id number] History of Present Illness: [name] is a 58 year old white female, single, PM in for a well woman gyn exam and pap. Last pap was in 2020.  She is working at [employer] now.   PCP is C Grooms PA.   Current Medications, Allergies, Past Medical History, Past Surgical History, Family History and Social History were reviewed in [employer] [facility] record.     Review of Systems: Patient denies any headaches, hearing loss, fatigue, blurred vision, shortness of breath, chest pain, abdominal pain, problems with bowel movements, urination, or intercourse. No joint pain or mood swings.  Has some urinary urgency at times, but no incontinence. Needs to have right knee replacement, has had left. Denies any vaginal bleeding    Physical Exam:BP 111/71 (BP Location: Left Arm, Patient Position: Sitting, Cuff Size: Large)   Pulse 76   Ht 5' 3 (1.6 m)   Wt 221 lb 8 oz (100.5 kg)   LMP 04/14/2011   BMI 39.24 kg/m   General:  Well developed, well nourished, no acute distress Skin:  Warm and dry Neck:  Midline trachea, normal thyroid , good ROM, no lymphadenopathy Lungs; Clear to auscultation bilaterally Breast:  No dominant palpable mass, retraction, or nipple discharge Cardiovascular: Regular rate and rhythm Abdomen:  Soft, non tender, no hepatosplenomegaly Pelvic:  External genitalia is normal in appearance, no lesions.  The vagina is pale. Urethra has no lesions or masses. The cervix is smooth, pap with HR HPV genotyping performed.  Uterus is felt to be normal size, shape, and contour.  No adnexal masses or tenderness noted.Bladder is non tender, no masses felt. Rectal: Good sphincter tone, no polyps, or hemorrhoids felt.  Hemoccult negative. Extremities/musculoskeletal:  No swelling or varicosities noted, no clubbing or cyanosis Psych:  No mood changes, alert and cooperative,seems happy AA is  1 Fall risk is low    04/04/2024    2:36 PM 03/30/2024   11:06 AM 03/10/2024    2:10 PM  Depression screen PHQ 2/9  Decreased Interest 0 0 0  Down, Depressed, Hopeless 2 0 0  PHQ - 2 Score 2 0 0  Altered sleeping 2    Tired, decreased energy 3    Change in appetite 2    Feeling bad or failure about yourself  3    Trouble concentrating 0    Moving slowly or fidgety/restless 1    Suicidal thoughts 0    PHQ-9 Score 13     She declines meds    04/04/2024    2:37 PM 02/03/2024    2:52 PM  GAD 7 : Generalized Anxiety Score  Nervous, Anxious, on Edge 1   Control/stop worrying 1 2  Worry too much - different things 1 2  Trouble relaxing 1 1  Restless 1 1  Easily annoyed or irritable 1 1  Afraid - awful might happen 1 0  Total GAD 7 Score 7   Anxiety Difficulty  Not difficult at all    Upstream - 04/04/24 1447       Pregnancy Intention Screening   Does the patient want to become pregnant in the next year? N/A    Does the patient's partner want to become pregnant in the next year? N/A    Would the patient like to discuss contraceptive options today? N/A      Contraception Wrap Up   Current Method No Method -  Other Reason   PM   End Method No Method - Other Reason   PM   Contraception Counseling Provided No           Examination chaperoned by Clarita Salt LPN   Impression and plan: 1. Routine general medical examination at a health care facility (Primary) Pap sent Pap in 3 years if negative Physical with PCP Labs with PCP Colonoscopy in 2027 per GI  Mammogram was negative 02/24/24 Stay active  - Cytology - PAP( [facility])  2. Encounter for screening fecal occult blood testing Hemoccult was negative  - POCT occult blood stool

## 2024-04-11 ENCOUNTER — Ambulatory Visit: Payer: Self-pay | Admitting: Women's Health

## 2024-04-11 LAB — CYTOLOGY - PAP
Adequacy: ABSENT
Comment: NEGATIVE
Diagnosis: NEGATIVE
High risk HPV: NEGATIVE

## 2024-05-04 ENCOUNTER — Ambulatory Visit: Admitting: Internal Medicine

## 2024-05-04 ENCOUNTER — Encounter: Payer: Self-pay | Admitting: Internal Medicine

## 2024-05-04 VITALS — BP 103/64 | HR 87 | Ht 63.0 in | Wt 218.4 lb

## 2024-05-04 DIAGNOSIS — J4521 Mild intermittent asthma with (acute) exacerbation: Secondary | ICD-10-CM | POA: Diagnosis not present

## 2024-05-04 NOTE — Assessment & Plan Note (Addendum)
 Never smoker  - flair with brown mucus p flu A  10/2023  but lungs clear  - 11/16/2023 rx depo 120 mg IM / omnicef  x 10 day and f/u PFT - 11/16/2023  After extensive coaching inhaler device,  effectiveness =    60% from a baseline of 30% (very late trigger)  - PFTs  03/08/24 p symbicort  80 > wnl   She has very mild asthma and therefore ok to tirate symbicort  80 Based on two studies from NEJM  378; 20 p 1865 (2018) and 380 : p2020-30 (2019) in pts with mild asthma it is reasonable to use low dose symbicort  eg 80 2bid prn flare in this setting but I emphasized this was only shown with symbicort  and takes advantage of the rapid onset of action but is not the same as rescue therapy but can be stopped once the acute symptoms have resolved and the need for rescue has been minimized (< 2 x weekly)    F/u can be yearly with inhalers in hand   - The proper method of use, as well as anticipated side effects, of a metered-dose inhaler were discussed and demonstrated to the patient using teach back method.          Each maintenance medication was reviewed in detail including emphasizing most importantly the difference between maintenance and prns and under what circumstances the prns are to be triggered using an action plan format where appropriate.  Total time for H and P, chart review, counseling, reviewing hfa  device(s) and generating customized AVS unique to this office visit / same day charting = 23 min

## 2024-05-04 NOTE — Patient Instructions (Addendum)
 No change in medication   Work on inhaler technique:  relax and gently blow all the way out then take a nice smooth full deep breath back in, triggering the inhaler at same time you start breathing in.  Hold breath in for at least  5 seconds if you can. Blow out symbicort   thru nose. Rinse and gargle with water when done.  If mouth or throat bother you at all,  try brushing teeth/gums/tongue with arm and hammer toothpaste/ make a slurry and gargle and spit out.   Ok to wean off symbicort   but restart at 2pffs every 12 hours in event of flare x one week   Please schedule a follow up visit in 12 months but call sooner if needed

## 2024-05-04 NOTE — Progress Notes (Addendum)
 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 Continue assisting patient with weight loss management Reassess blood pressure and adjust antihypertensive regimen as required.   Discharge Diagnoses: Principal Problem:   Acute respiratory failure with hypoxia (HCC)   COPD exacerbation (HCC)   Class 2 obesity   History of hypothyroidism   Dyslipidemia   GERD (gastroesophageal reflux disease)   HTN (hypertension)   Depression   58 y.o. female with medical history significant of COPD, hypothyroidism, hypertension, gastroesophageal reflux disease and class II obesity; presenting to the hospital secondary to shortness of breath, intermittent coughing spells and increased wheezing.  Patient reports symptom has been present for the last 3 days or so and worsening.  At home she has inhaler and nebulizer machine and despite provided treatment symptoms has failed to improve. No fever, no chest pain, no nausea, no vomiting, no sick contacts.   In the ED chest x-ray demonstrating bronchitic changes, diffuse expiratory wheezing on exam and the presence of hypoxia especially on exertion.  2 L nasal cannula supplementation in place at time of my evaluation.   COVID PCR negative.   Assessment and Plan: 1-acute respiratory failure with hypoxia secondary to COPD exacerbation and bronchiectasis. -Patient treated with steroids, antibiotics, bronchodilator management, mucolytic's and oxygen supplementation. -Excellent  response to treatment and at time of discharge no oxygen supplementation was required -Patient discharged on steroids tapering, 4 more days of doxycycline  twice a day, resumption of her as needed albuterol  and Symbicort  and instruction to follow-up with PCP and pulmonologist as an outpatient.   2-hypertension -Continue home antihypertensive agents -Heart healthy diet discussed with patient.   3-history of hypothyroidism -Continue Synthroid .   4-depression: -No suicidal ideation or hallucinations -Stable mood. -Continue home Wellbutrin  and Lexapro .   5-GERD -Continue PPI. -Lifestyle modifications discussed with patient.   6-class II obesity -Body mass index is 38.97 kg/m. -Low-calorie diet, portion control and increase physical activity discussed with patient.   7-dyslipidemia -Continue statin.     Sinus surgery in [place]   2000 reduced need for nasal spray    History of Present Illness  08/06/2023  Pulmonary/ 1st office eval/ [name] / [facility] / no pfts done yet / symbicort  80  Chief Complaint  Patient presents with   Consult  Dyspnea:  good days 0.10 miles up to mailbox and slt down hill to house  Cough: dry hack sporadic / worse with laughing  Sleep: bed is flat / 2 pillow  SABA use: couple of times a day  02: none  Rec Plan A = Automatic = Always=   Symbicort  80 Take 2 puffs first thing in am and then another 2 puffs about 12 hours later (add the pm doses)  Work on inhaler technique:  Pantoprazole  (protonix ) 40 mg   Take  30-60 min before first meal of the day and Pepcid  (famotidine )  20 mg after supper until return to office  GERD diet reviewed, bed  blocks rec    Plan B = Backup (to supplement plan A, not to replace it) Only use your albuterol  inhaler as a rescue medication Plan C = Crisis (instead of Plan B but only if Plan B Also  Ok to try albuterol  15 min before an activity (on alternating days)  that you know would usually make you short of breath      Sick   11/06/23  sore throat / fever with UC eval 11/10/23  > depomedrol only   11/16/2023  f/u ov/[place] [facility]/[name] re: AB maint on symb 80 2bid   Chief Complaint  Patient presents with   Follow-up    3 month follow up   Dyspnea:  improving p steroid shot but still very congested  Cough: borwn mucus from nose/ coughing it also  Sleeping: flat bed / 2 pillows s    resp cc  SABA use: using about 3-4 / day, only once  on day ov / not needing neb  02: none  Rec Depomedrol 120 mg IM  Work on inhaler technique:  r  Please schedule a follow up visit in 3 months but call sooner if needed - bring inhalers  Late add Omnicef  x 10 days  (allergy was yeast from pcn) PFTs  03/08/24 p symbicort  80 > wnl     05/04/2024  f/u ov/ office/[name] re: AB maint on symbicort  80 2 each am    did not  bring inhalers  Chief Complaint  Patient presents with   Shortness of Breath    Her shob is not bad  Dyspnea:  Not limited by breathing from desired activities   Cough: no am congestion despite mild nasal congestion  Sleeping: flat bed/ 2 pillow s    resp cc  SABA use: none at all  02: none       No obvious day to day or daytime variability or assoc excess/ purulent sputum or mucus plugs or hemoptysis or cp or chest tightness, subjective wheeze or overt   hb symptoms.    Also denies any obvious fluctuation of symptoms with weather or environmental changes or other aggravating or alleviating factors except as outlined above   No unusual exposure hx or h/o childhood pna/ asthma or knowledge of premature birth.  Current Allergies, Complete Past Medical History, Past Surgical History, Family History, and Social History were reviewed in [employer] [facility] record.  ROS  The following are not active complaints unless bolded Hoarseness, sore throat, dysphagia, dental problems, itching, sneezing,  nasal congestion or discharge of excess mucus or purulent secretions, ear ache,    fever, chills, sweats, unintended wt loss or wt gain, classically pleuritic or exertional cp,  orthopnea pnd or arm/hand swelling  or leg swelling, presyncope, palpitations, abdominal pain, anorexia, nausea, vomiting, diarrhea  or change in bowel habits or change in bladder habits, change in stools or change in urine, dysuria, hematuria,  rash, arthralgias, visual complaints, headache, numbness, weakness or ataxia or problems with walking or coordination,  change in mood or  memory.        Current Meds  Medication Sig   Acetaminophen  (TYLENOL  PO) Take by mouth as needed.    albuterol  (VENTOLIN  HFA) 108 (90 Base) MCG/ACT inhaler Inhale 2 puffs into the lungs every 6 (six) hours as needed for wheezing or shortness of breath.   budesonide -formoterol  (SYMBICORT ) 80-4.5 MCG/ACT inhaler Inhale 2 puffs into the lungs 2 (two) times daily.   cholecalciferol (VITAMIN D3) 25 MCG (1000 UNIT)  tablet Take 1,000 Units by mouth daily.   diclofenac  (VOLTAREN ) 75 MG EC tablet Take 1 tablet (75 mg total) by mouth 2 (two) times daily.   famotidine  (PEPCID ) 20 MG tablet One after supper   levothyroxine  (SYNTHROID , LEVOTHROID) 25 MCG tablet Take 25 mcg by mouth daily before breakfast.   olmesartan -hydrochlorothiazide  (BENICAR  HCT) 40-25 MG tablet Take 1 tablet by mouth daily. Patient has not taken in about 3 weeks because pills were lost and she can't get them refilled.   pantoprazole  (PROTONIX ) 40 MG tablet Take 1 tablet (40 mg total) by mouth daily.   Potassium Gluconate 550 MG TABS Take 550 mg by mouth daily.   rosuvastatin  (CRESTOR ) 5 MG tablet Take 1 tablet (5 mg total) by mouth daily.               Past Medical History:  Diagnosis Date   Arthritis    Back pain 10/30/2015   Bilateral shoulder pain 10/30/2015   COPD (chronic obstructive pulmonary disease) (HCC)    Fecal occult blood test positive 10/30/2015   Hemorrhoids 10/30/2015   Hypertension    Hypothyroidism    Large breasts 10/30/2015   43 DD    Postmenopausal 10/30/2015   Sleep apnea    pt DX but did not want to use CPAP.   Thyroid  disease       Objective:    Wts  05/04/2024       218  11/16/2023       223   08/06/23 223 lb (101.2 kg)  07/20/23 225 lb (102.1 kg)  06/08/23 220 lb (99.8 kg)    Vital signs reviewed  05/04/2024  - Note at rest 02 sats  98% on RA   General appearance:    amb mod obese (by BMI) wf nad     HEENT : Oropharynx clear    NECK :  without  apparent JVD/ palpable Nodes/TM    LUNGS: no acc muscle use,  Nl contour chest which is clear to A and P bilaterally without cough on insp or exp maneuvers   CV:  RRR  no s3 or murmur or increase in P2, and no edema   ABD:  obese soft and nontender   MS:  Gait nl   ext warm without deformities Or obvious joint restrictions  calf tenderness, cyanosis or clubbing    SKIN: warm and dry without lesions    NEURO:  alert, approp, nl sensorium with  no motor or cerebellar deficits apparent. c          Assessment     Assessment & Plan Mild intermittent asthmatic bronchitis with acute exacerbation Never smoker  - flair with brown mucus p flu A  10/2023  but lungs clear  - 11/16/2023 rx depo 120 mg IM / omnicef  x 10 day and f/u PFT - 11/16/2023  After extensive coaching inhaler device,  effectiveness =    60% from a baseline of 30% (very late trigger)  - PFTs  03/08/24 p symbicort  80 > wnl   She has very mild asthma and therefore ok to tirate symbicort  80 Based on two studies from NEJM  378; 20 p 1865 (2018) and 380 : p2020-30 (2019) in pts with mild asthma it is reasonable to use low dose symbicort  eg 80 2bid prn flare in this setting but I emphasized this was only shown with symbicort  and takes advantage of the rapid onset of action but is not the same as rescue therapy but can  be stopped once the acute symptoms have resolved and the need for rescue has been minimized (< 2 x weekly)    F/u can be yearly with inhalers in hand   - The proper method of use,  as well as anticipated side effects, of a metered-dose inhaler were discussed and demonstrated to the patient using teach back method.          Each maintenance medication was reviewed in detail including emphasizing most importantly the difference between maintenance and prns and under what circumstances the prns are to be triggered using an action plan format where appropriate.  Total time for H and P, chart review, counseling, reviewing hfa  device(s) and generating customized AVS unique to this office visit / same day charting = 23 min               Patient Instructions  No change in medication   Work on inhaler technique:  relax and gently blow all the way out then take a nice smooth full deep breath back in, triggering the inhaler at same time you start breathing in.  Hold breath in for at least  5 seconds if you can. Blow out symbicort   thru nose. Rinse and gargle with water when done.  If mouth or throat bother you at all,  try brushing teeth/gums/tongue with arm and hammer toothpaste/ make a slurry and gargle and spit out.   Ok to wean off symbicort   but restart at 2pffs every 12 hours in event of flare x one week   Please schedule a follow up visit in 12 months but call sooner if needed         Ozell America, MD 05/04/2024

## 2024-05-09 ENCOUNTER — Other Ambulatory Visit: Payer: Self-pay | Admitting: Physician Assistant

## 2024-05-09 MED ORDER — LEVOTHYROXINE SODIUM 25 MCG PO TABS: 25.0000 ug | ORAL_TABLET | Freq: Every day | ORAL | 1 refills | Status: AC

## 2024-05-09 NOTE — Telephone Encounter (Unsigned)
 Copied from CRM #[id number]. Topic: Clinical - Medication Refill >> May 09, 2024  9:09 AM Tanazia G wrote: Medication:  levothyroxine  (SYNTHROID , LEVOTHROID) 25 MCG tablet    Has the patient contacted their pharmacy? Yes (Agent: If no, request that the patient contact the pharmacy for the refill. If patient does not wish to contact the pharmacy document the reason why and proceed with request.) (Agent: If yes, when and what did the pharmacy advise?)  This is the patient's preferred pharmacy:  [facility] [address], [place] - [address] [place] [address] Phone: [phone] Fax: [phone]  Is this the correct pharmacy for this prescription? Yes If no, delete pharmacy and type the correct one.   Has the prescription been filled recently? Yes  Is the patient out of the medication? Yes  Has the patient been seen for an appointment in the last year OR does the patient have an upcoming appointment? Yes  Can we respond through MyChart? Yes  Agent: Please be advised that Rx refills may take up to 3 business days. We ask that you follow-up with your pharmacy.

## 2024-05-24 ENCOUNTER — Ambulatory Visit: Attending: Student in an Organized Health Care Education/Training Program | Admitting: Nurse Practitioner

## 2024-05-24 ENCOUNTER — Encounter: Payer: Self-pay | Admitting: Nurse Practitioner

## 2024-05-24 VITALS — BP 108/68 | HR 73 | Temp 97.2°F | Resp 18 | Ht 63.0 in | Wt 215.0 lb

## 2024-05-24 DIAGNOSIS — G8929 Other chronic pain: Secondary | ICD-10-CM | POA: Insufficient documentation

## 2024-05-24 DIAGNOSIS — M47816 Spondylosis without myelopathy or radiculopathy, lumbar region: Secondary | ICD-10-CM | POA: Diagnosis not present

## 2024-05-24 DIAGNOSIS — Z96652 Presence of left artificial knee joint: Secondary | ICD-10-CM | POA: Diagnosis not present

## 2024-05-24 DIAGNOSIS — M25561 Pain in right knee: Secondary | ICD-10-CM | POA: Diagnosis not present

## 2024-05-24 DIAGNOSIS — M25562 Pain in left knee: Secondary | ICD-10-CM | POA: Insufficient documentation

## 2024-05-24 DIAGNOSIS — M1711 Unilateral primary osteoarthritis, right knee: Secondary | ICD-10-CM | POA: Insufficient documentation

## 2024-05-24 DIAGNOSIS — G894 Chronic pain syndrome: Secondary | ICD-10-CM | POA: Insufficient documentation

## 2024-05-24 NOTE — Progress Notes (Signed)
 PROVIDER NOTE: Interpretation of information contained herein should be left to medically-trained personnel. Specific patient instructions are provided elsewhere under Patient Instructions section of medical record. This document was created in part using AI and STT-dictation technology, any transcriptional errors that may result from this process are unintentional.  Patient: [name]  Service: E/M   PCP: Mancil Pfeiffer, PA-C  DOB: 1966-05-05  DOS: 05/24/2024  Provider: Emmy MARLA Blanch, NP  MRN: [id number]  Delivery: Face-to-face  Specialty: Interventional Pain Management  Type: Established Patient  Setting: Ambulatory outpatient facility  Specialty designation: 09  Referring Prov.: Grooms, [place], [place]  Location: Outpatient office facility       History of present illness (HPI) Ms. [name], a 58 y.o. year old female, is here today because of her Lumbar facet arthropathy [M47.816]. Ms. [name] primary complain today is Back Pain  Pertinent problems: Ms. [name] has Lumbar facet arthropathy; Chronic pain syndrome; and HTN (hypertension) on their pertinent problem list.   Pain Assessment: Severity of Chronic pain is reported as a 0-No pain/10. Location: Back Lower/DENIES. Onset: More than a month ago. Quality: Aching, Burning, Constant. Timing: Intermittent. Modifying factor(s): Procedure. Vitals:  height is 5' 3 (1.6 m) and weight is 215 lb (97.5 kg). Her temperature is 97.2 F (36.2 C) (abnormal). Her blood pressure is 108/68 and her pulse is 73. Her respiration is 18 and oxygen saturation is 100%.  BMI: Estimated body mass index is 38.09 kg/m as calculated from the following:   Height as of this encounter: 5' 3 (1.6 m).   Weight as of this encounter: 215 lb (97.5 kg).  Last encounter: 03/10/2024 Last procedure: 03/30/2024  Reason for encounter: post-procedure evaluation and assessment.    Ms. [name] underwent a diagnostic/therapeutic Lumbar Facet, Medial Branch  radiofrequency ablation (RFA) on March 30, 2024.  She reports 50% initially pain relief and functional improvement during local anesthetic and sustain 90% pain relief and functional improvement since the procedure.   Procedure Procedure: Lumbar Facet, Medial Branch Radiofrequency Ablation (RFA) #1  Laterality: Bilateral (-50)  Level: L3, L4, and L5 Medial Branch Level(s). These levels will denervate the L3-4 and L4-5 lumbar facet joints.  Imaging: Fluoroscopy-guided         Anesthesia: Local anesthesia (1-2% Lidocaine ) Sedation: Moderate Sedation                       DOS: 03/30/2024  Performed by: Connie Sherry, MD   Purpose: Therapeutic/Palliative Indications: Low back pain severe enough to impact quality of life or function. Indications: 1. Lumbar facet arthropathy   2. Lumbar spondylosis   3. Chronic pain syndrome     Ms. [name] has been dealing with the above chronic pain for longer than three months and has either failed to respond, was unable to tolerate, or simply did not get enough benefit from other more conservative therapies including, but not limited to: 1. Over-the-counter medications 2. Anti-inflammatory medications 3. Muscle relaxants 4. Membrane stabilizers 5. Opioids 6. Physical therapy and/or chiropractic manipulation 7. Modalities (Heat, ice, etc.) 8. Invasive techniques such as nerve blocks. Ms. [name] has attained more than 50% relief of the pain from a series of diagnostic injections conducted in separate occasions.   Pain Score: Pre-procedure: 7 /10 Post-procedure: 3 /10  Post-Procedure Evaluation    Effectiveness:  Initial hour after procedure: 0 % . Subsequent 4-6 hours post-procedure: 50 % . Analgesia past initial 6 hours: 90 % . Ongoing improvement:  Analgesic:  Ms.  [name] underwent a diagnostic/therapeutic Lumbar Facet, Medial Branch radiofrequency ablation (RFA) on March 30, 2024.  She reports 50% initially pain relief and functional improvement during  local anesthetic and sustain 90% pain relief and functional improvement since the procedure.  Function: Ms. [name] reports improvement in function ROM: Ms. [name] reports improvement in ROM  Pharmacotherapy Assessment   Monitoring: [place] PMP: PDMP not reviewed this encounter.       Pharmacotherapy: No side-effects or adverse reactions reported. Compliance: No problems identified. Effectiveness: Clinically acceptable.  [name], [place]  05/24/2024  3:09 PM  Sign when Signing Visit Safety precautions to be maintained throughout the outpatient stay will include: orient to surroundings, keep bed in low position, maintain call bell within reach at all times, provide assistance with transfer out of bed and ambulation.     UDS:  No results found for: SUMMARY  No results found for: CBDTHCR No results found for: D8THCCBX No results found for: D9THCCBX  ROS  Constitutional: Denies any fever or chills Gastrointestinal: No reported hemesis, hematochezia, vomiting, or acute GI distress Musculoskeletal: Denies any acute onset joint swelling, redness, loss of ROM, or weakness Neurological: No reported episodes of acute onset apraxia, aphasia, dysarthria, agnosia, amnesia, paralysis, loss of coordination, or loss of consciousness  Medication Review  Acetaminophen , Potassium Gluconate, albuterol , budesonide -formoterol , cholecalciferol, diclofenac , famotidine , levothyroxine , olmesartan -hydrochlorothiazide , pantoprazole , and rosuvastatin   History Review  Allergy: [name] is allergic to penicillins. Drug: [name]  reports no history of drug use. Alcohol:  reports current alcohol use. Tobacco:  reports that she has never smoked. She has never used smokeless tobacco. Social: [name]  reports that she has never smoked. She has never used smokeless tobacco. She reports current alcohol use. She reports that she does not use drugs. Medical:  has a past medical history of Arthritis, Back pain  (10/30/2015), Bilateral shoulder pain (10/30/2015), COPD (chronic obstructive pulmonary disease) (HCC), Fecal occult blood test positive (10/30/2015), Hemorrhoids (10/30/2015), Hypertension, Hypothyroidism, Large breasts (10/30/2015), Postmenopausal (10/30/2015), Sleep apnea, and Thyroid  disease. Surgical: Ms. Nass  has a past surgical history that includes Nasal sinus surgery; Knee arthroscopy with medial menisectomy (Left, 05/04/2015); Colonoscopy (N/A, 11/12/2015); Total knee arthroplasty (Left, 01/29/2017); Joint replacement; Rotator cuff repair (Right); Shoulder surgery (Right, 10/12/2018); Shoulder arthroscopy (Right, 10/12/2018); Breast reduction surgery (Bilateral, 04/17/2020); and back ablation (03/2024). Family: family history includes Alzheimer's disease in her mother; Breast cancer in her cousin and maternal aunt; Cancer in her brother and father; Diabetes in her brother; Heart attack in her brother; Heart disease in her brother, maternal grandfather, and maternal grandmother; Hypertension in her maternal grandfather; Lupus in her maternal grandmother; Other in her brother.  Laboratory Chemistry Profile   Renal Lab Results  Component Value Date   BUN 25 (H) 02/04/2024   CREATININE 1.03 (H) 02/04/2024   BCR 24 (H) 02/04/2024   GFRAA >60 04/16/2020   GFRNONAA >60 06/09/2023    Hepatic Lab Results  Component Value Date   AST 23 02/04/2024   ALT 21 02/04/2024   ALBUMIN 4.1 02/04/2024   ALKPHOS 127 (H) 02/04/2024   AMYLASE 32 06/06/2008   LIPASE 28 05/13/2022    Electrolytes Lab Results  Component Value Date   NA 137 02/04/2024   K 4.3 02/04/2024   CL 97 02/04/2024   CALCIUM  9.3 02/04/2024    Bone Lab Results  Component Value Date   VD25OH 32 06/15/2018    Inflammation (CRP: Acute Phase) (ESR: Chronic Phase) Lab Results  Component Value Date  ESRSEDRATE 17 06/15/2018         Note: Above Lab results reviewed.  Recent Imaging Review  DG PAIN CLINIC C-ARM 1-60 MIN NO  REPORT Fluoro was used, but no Radiologist interpretation will be provided.  Please refer to NOTES tab for provider progress note. Note: Reviewed        Physical Exam  Vitals: BP 108/68   Pulse 73   Temp (!) 97.2 F (36.2 C)   Resp 18   Ht 5' 3 (1.6 m)   Wt 215 lb (97.5 kg)   LMP 04/14/2011   SpO2 100%   BMI 38.09 kg/m  BMI: Estimated body mass index is 38.09 kg/m as calculated from the following:   Height as of this encounter: 5' 3 (1.6 m).   Weight as of this encounter: 215 lb (97.5 kg). Ideal: Ideal body weight: 52.4 kg (115 lb 8.3 oz) Adjusted ideal body weight: 70.4 kg (155 lb 5 oz) General appearance: Well nourished, well developed, and well hydrated. In no apparent acute distress Mental status: Alert, oriented x 3 (person, place, & time)       Respiratory: No evidence of acute respiratory distress Eyes: PERLA   Assessment   Diagnosis Status  1. Lumbar facet arthropathy   2. Lumbar spondylosis   3. Chronic knee pain after total replacement of left knee joint   4. Chronic pain of right knee   5. Chronic pain syndrome   6. Primary osteoarthritis of right knee    Controlled Controlled Controlled   Updated Problems: No problems updated.  Plan of Care  Problem-specific:  Assessment and Plan   The patient reports significant relief in her low back pain following lumbar RFA.  She has no additional questions or concerns at this visit.   Ms. Tonetta Napoles has a current medication list which includes the following long-term medication(s): albuterol , budesonide -formoterol , famotidine , levothyroxine , olmesartan -hydrochlorothiazide , pantoprazole , and rosuvastatin .  Pharmacotherapy (Medications Ordered): No orders of the defined types were placed in this encounter.  Orders:  No orders of the defined types were placed in this encounter.       No follow-ups on file.    Recent Visits Date Type Provider Dept  03/30/24 Procedure visit Marcelino Nurse, MD  [facility]  03/10/24 Office Visit Marcelino Nurse, MD [facility]  Showing recent visits within past 90 days and meeting all other requirements Today's Visits Date Type Provider Dept  05/24/24 Office Visit Joneric Streight K, NP [facility]  Showing today's visits and meeting all other requirements Future Appointments No visits were found meeting these conditions. Showing future appointments within next 90 days and meeting all other requirements  I discussed the assessment and treatment plan with the patient. The patient was provided an opportunity to ask questions and all were answered. The patient agreed with the plan and demonstrated an understanding of the instructions.  Patient advised to call back or seek an in-person evaluation if the symptoms or condition worsens.  Duration of encounter: 20 minutes.  Total time on encounter, as per AMA guidelines included both the face-to-face and non-face-to-face time personally spent by the physician and/or other qualified health care professional(s) on the day of the encounter (includes time in activities that require the physician or other qualified health care professional and does not include time in activities normally performed by clinical staff). Physician's time may include the following activities when performed: Preparing to see the patient (e.g., pre-charting review of records, searching for previously ordered  imaging, lab work, and nerve conduction tests) Review of prior analgesic pharmacotherapies. Reviewing PMP Interpreting ordered tests (e.g., lab work, imaging, nerve conduction tests) Performing post-procedure evaluations, including interpretation of diagnostic procedures Obtaining and/or reviewing separately obtained history Performing a medically appropriate examination and/or evaluation Counseling and educating the patient/family/caregiver Ordering medications, tests, or procedures Referring and  communicating with other health care professionals (when not separately reported) Documenting clinical information in the electronic or other health record Independently interpreting results (not separately reported) and communicating results to the patient/ family/caregiver Care coordination (not separately reported)  Note by: Kel Senn K Artice Holohan, NP (TTS and AI technology used. I apologize for any typographical errors that were not detected and corrected.) Date: 05/24/2024; Time: 3:59 PM

## 2024-05-24 NOTE — Progress Notes (Signed)
 Safety precautions to be maintained throughout the outpatient stay will include: orient to surroundings, keep bed in low position, maintain call bell within reach at all times, provide assistance with transfer out of bed and ambulation.

## 2024-07-08 ENCOUNTER — Ambulatory Visit
Admission: EM | Admit: 2024-07-08 | Discharge: 2024-07-08 | Disposition: A | Discharge status: Home / Self Care | Attending: Family Medicine | Admitting: Family Medicine

## 2024-07-08 DIAGNOSIS — J069 Acute upper respiratory infection, unspecified: Secondary | ICD-10-CM

## 2024-07-08 LAB — POC SOFIA SARS ANTIGEN FIA: SARS Coronavirus 2 Ag: NEGATIVE

## 2024-07-08 MED ORDER — PROMETHAZINE-DM 6.25-15 MG/5ML PO SYRP: 5.0000 mL | ORAL_SOLUTION | Freq: Four times a day (QID) | ORAL | 0 refills | Status: AC | PRN

## 2024-07-08 MED ORDER — AZELASTINE HCL 0.1 % NA SOLN: 1.0000 | Freq: Two times a day (BID) | NASAL | 0 refills | Status: AC

## 2024-07-08 NOTE — ED Triage Notes (Signed)
 PT REPORTS SHE HAS A SORE THROAT, SINUS DRAINAGE, COUGH, AND HEADACHE X 1 DAY   TOOK COLD AND FLU OTC MEDS

## 2024-07-08 NOTE — Discharge Instructions (Addendum)
 Your vital signs and exam are overall reassuring today, I suspect a viral upper respiratory infection.  Your COVID test was negative.  I have sent over a good dry nasal spray and cough syrup and continue to use your inhalers as needed, over-the-counter remedies including saline sinus rinses, humidifiers and Coricidin HBP.

## 2024-07-08 NOTE — ED Provider Notes (Signed)
 [facility]    CSN: [id number] Arrival date & time: 07/08/24  1704      History   Chief Complaint No chief complaint on file.   HPI [name] is a 58 y.o. female.   Patient presenting today with 1 day history of sore throat, sinus drainage, cough, headache, sinus pressure, fatigue.  Denies chest pain, shortness of breath, abdominal pain, vomiting, diarrhea.  So far trying Alka-Seltzer cold and flu with minimal relief.  History of asthmatic bronchitis and COPD, states inhaler regimen has been helpful.    Past Medical History:  Diagnosis Date   Arthritis    Back pain 10/30/2015   Bilateral shoulder pain 10/30/2015   COPD (chronic obstructive pulmonary disease) (HCC)    Fecal occult blood test positive 10/30/2015   Hemorrhoids 10/30/2015   Hypertension    Hypothyroidism    Large breasts 10/30/2015   43 DD   Postmenopausal 10/30/2015   Sleep apnea    pt DX but did not want to use CPAP.   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   Plantar fasciitis 12/06/2013    Past Surgical History:  Procedure Laterality Date   back ablation  03/2024   BREAST REDUCTION SURGERY Bilateral 04/17/2020   Procedure: MAMMARY REDUCTION  (BREAST);  Surgeon: Elisabeth Craig RAMAN, MD;  Location: [facility];  Service: Plastics;  Laterality: Bilateral;  2.5 hours, please   COLONOSCOPY N/A 11/12/2015   Procedure:  COLONOSCOPY;  Surgeon: Margo LITTIE Haddock, MD;  Location: [facility];  Service: Endoscopy;  Laterality: N/A;  8:30 AM   JOINT REPLACEMENT     KNEE ARTHROSCOPY WITH MEDIAL MENISECTOMY Left 05/04/2015   Procedure: KNEE ARTHROSCOPY WITH MEDIAL AND LATERAL MENISECTOMY;  Surgeon: Taft FORBES Minerva, MD;  Location: [facility];  Service: Orthopedics;  Laterality: Left;   NASAL SINUS SURGERY     ROTATOR CUFF REPAIR Right    SHOULDER ARTHROSCOPY Right 10/12/2018   right rotator cuff repair    SHOULDER SURGERY Right 10/12/2018   rotator cuff repair    TOTAL KNEE ARTHROPLASTY Left 01/29/2017   Procedure: TOTAL KNEE ARTHROPLASTY;  Surgeon: Minerva Taft FORBES, MD;  Location: [facility];  Service: Orthopedics;  Laterality: Left;    OB History     Gravida  0   Para  0   Term  0   Preterm  0   AB  0   Living  0      SAB  0   IAB  0   Ectopic  0   Multiple  0   Live Births               Home Medications    Prior to Admission medications   Medication Sig Start Date End Date Taking? Authorizing Provider  azelastine (ASTELIN) 0.1 % nasal spray Place 1 spray into both nostrils 2 (two) times daily. Use in each nostril as directed 07/08/24  Yes Stuart Millman  [place], PA-C  promethazine -dextromethorphan  (PROMETHAZINE -DM) 6.25-15 MG/5ML syrup Take 5 mLs by mouth 4 (four) times daily as needed. 07/08/24  Yes Stuart Vernell Norris, PA-C  Acetaminophen  (TYLENOL  PO) Take by mouth as needed.     [provider]  albuterol  (VENTOLIN  HFA) 108 (90 Base) MCG/ACT inhaler Inhale 2 puffs into the lungs every 6 (six) hours as needed for wheezing or shortness of breath. 09/28/22   Leath-Warren, Etta PARAS, NP  budesonide -formoterol  (SYMBICORT ) 80-4.5 MCG/ACT inhaler Inhale 2 puffs into the lungs 2 (two) times daily. 06/09/23   Ricky Fines, MD  cholecalciferol (VITAMIN D3) 25 MCG (1000 UNIT) tablet Take 1,000 Units by mouth daily.    [provider]  diclofenac  (VOLTAREN ) 75 MG EC tablet  Take 1 tablet (75 mg total) by mouth 2 (two) times daily. 02/03/23   Marcelino Nurse, MD  famotidine  (PEPCID ) 20 MG tablet One after supper 08/06/23   Wert, Michael B, MD  levothyroxine  (SYNTHROID ) 25 MCG tablet Take 1 tablet (25 mcg total) by mouth daily before breakfast. 05/09/24   Grooms, [place], PA-C  olmesartan -hydrochlorothiazide  (BENICAR  HCT) 40-25 MG tablet Take 1 tablet by mouth daily. Patient has not taken in about 3 weeks because pills were lost and she can't get them refilled. 05/10/23   [provider]  pantoprazole  (PROTONIX ) 40 MG tablet Take 1 tablet (40 mg total) by mouth daily. 06/10/23   Ricky Fines, MD  Potassium Gluconate 550 MG TABS Take 550 mg by mouth daily.    [provider]  rosuvastatin  (CRESTOR ) 5 MG tablet Take 1 tablet (5 mg total) by mouth daily. 02/03/24   Grooms, [place], PA-C    Family History Family History  Problem Relation Age of Onset   Lupus Maternal Grandmother    Heart disease Maternal Grandmother    Heart disease Maternal Grandfather    Hypertension Maternal Grandfather    Cancer Father        brain tumor   Alzheimer's disease Mother    Diabetes Brother    Other Brother        [place] disease   Heart disease Brother    Heart attack Brother    Cancer Brother        melanoma   Breast cancer Maternal Aunt        unsure of age    Breast cancer Cousin     Social History Social History   Tobacco Use   Smoking status: Never   Smokeless tobacco: Never  Vaping Use   Vaping status: Never Used  Substance Use Topics   Alcohol use: Yes    Comment: rare   Drug use: No     Allergies   Penicillins   Review of Systems Review of Systems Per HPI  Physical Exam Triage Vital Signs ED Triage Vitals  Encounter Vitals Group     BP 07/08/24 1737 118/75     Girls Systolic BP Percentile --      Girls Diastolic BP Percentile --      Boys Systolic BP Percentile --      Boys Diastolic BP Percentile --      Pulse Rate  07/08/24 1737 74     Resp 07/08/24 1737 20     Temp 07/08/24 1737 98.2 F (36.8 C)     Temp Source 07/08/24 1737 Oral     SpO2 07/08/24 1737 96 %     Weight --      Height --      Head Circumference --  Peak Flow --      Pain Score 07/08/24 1736 0     Pain Loc --      Pain Education --      Exclude from Growth Chart --    No data found.  Updated Vital Signs BP 118/75 (BP Location: Right Arm)   Pulse 74   Temp 98.2 F (36.8 C) (Oral)   Resp 20   LMP 04/14/2011   SpO2 96%   Visual Acuity Right Eye Distance:   Left Eye Distance:   Bilateral Distance:    Right Eye Near:   Left Eye Near:    Bilateral Near:     Physical Exam Vitals and nursing note reviewed.  Constitutional:      Appearance: Normal appearance.  HENT:     Head: Atraumatic.     Right Ear: Tympanic membrane and external ear normal.     Left Ear: Tympanic membrane and external ear normal.     Nose: Rhinorrhea present.     Mouth/Throat:     Mouth: Mucous membranes are moist.     Pharynx: Posterior oropharyngeal erythema present.  Eyes:     Extraocular Movements: Extraocular movements intact.     Conjunctiva/sclera: Conjunctivae normal.  Cardiovascular:     Rate and Rhythm: Normal rate and regular rhythm.     Heart sounds: Normal heart sounds.  Pulmonary:     Effort: Pulmonary effort is normal.     Breath sounds: Normal breath sounds. No wheezing or rales.  Musculoskeletal:        General: Normal range of motion.     Cervical back: Normal range of motion and neck supple.  Skin:    General: Skin is warm and dry.  Neurological:     Mental Status: She is alert and oriented to person, place, and time.  Psychiatric:        Mood and Affect: Mood normal.        Thought Content: Thought content normal.      UC Treatments / Results  Labs (all labs ordered are listed, but only abnormal results are displayed) Labs Reviewed  POC SOFIA SARS ANTIGEN FIA    EKG   Radiology No results  found.  Procedures Procedures (including critical care time)  Medications Ordered in UC Medications - No data to display  Initial Impression / Assessment and Plan / UC Course  I have reviewed the triage vital signs and the nursing notes.  Pertinent labs & imaging results that were available during my care of the patient were reviewed by me and considered in my medical decision making (see chart for details).     Vitals and exam reassuring today, rapid COVID-negative.  Suspect viral upper respiratory infection.  Will treat with Phenergan  DM, Astelin, continued inhaler regimen for COPD and asthma, supportive over-the-counter medications and home care.  Return for worsening symptoms. Final Clinical Impressions(s) / UC Diagnoses   Final diagnoses:  Viral URI with cough     Discharge Instructions      Your vital signs and exam are overall reassuring today, I suspect a viral upper respiratory infection.  Your COVID test was negative.  I have sent over a good dry nasal spray and cough syrup and continue to use your inhalers as needed, over-the-counter remedies including saline sinus rinses, humidifiers and Coricidin HBP.   ED Prescriptions     Medication Sig Dispense Auth. Provider   promethazine -dextromethorphan  (PROMETHAZINE -DM) 6.25-15 MG/5ML syrup Take 5 mLs by mouth 4 (four) times  daily as needed. 100 mL Stuart Vernell Norris, PA-C   azelastine (ASTELIN) 0.1 % nasal spray Place 1 spray into both nostrils 2 (two) times daily. Use in each nostril as directed 30 mL Stuart Vernell Norris, PA-C      PDMP not reviewed this encounter.   Stuart Vernell Norris, [place] 07/08/24 1825

## 2024-07-13 ENCOUNTER — Telehealth: Payer: Self-pay | Admitting: Physician Assistant

## 2024-07-13 NOTE — Telephone Encounter (Signed)
 Patient is requesting a prescription for   pantoprazole  (PROTONIX ) 40 MG tablet    [facility]

## 2024-07-14 ENCOUNTER — Other Ambulatory Visit: Payer: Self-pay | Admitting: Physician Assistant

## 2024-07-14 MED ORDER — PANTOPRAZOLE SODIUM 40 MG PO TBEC: 40.0000 mg | DELAYED_RELEASE_TABLET | Freq: Every day | ORAL | 1 refills | Status: AC

## 2024-07-26 ENCOUNTER — Encounter: Payer: Self-pay | Admitting: Physician Assistant

## 2024-09-16 ENCOUNTER — Ambulatory Visit
Admission: EM | Admit: 2024-09-16 | Discharge: 2024-09-16 | Disposition: A | Discharge status: Home / Self Care | Attending: Family Medicine | Admitting: Family Medicine

## 2024-09-16 DIAGNOSIS — J340 Abscess, furuncle and carbuncle of nose: Secondary | ICD-10-CM

## 2024-09-16 MED ORDER — DOXYCYCLINE HYCLATE 100 MG PO CAPS: 100.0000 mg | ORAL_CAPSULE | Freq: Two times a day (BID) | ORAL | 0 refills | Status: DC

## 2024-09-16 MED ORDER — MUPIROCIN 2 % EX OINT: 1.0000 | TOPICAL_OINTMENT | Freq: Two times a day (BID) | CUTANEOUS | 0 refills | Status: AC

## 2024-09-16 NOTE — ED Triage Notes (Signed)
 Pt reports she has a sore on the left side of her nose that is painful x 6 days  Took tylenol 

## 2024-09-16 NOTE — ED Provider Notes (Signed)
 " [facility]    CSN: [id number] Arrival date & time: 09/16/24  1422      History   Chief Complaint No chief complaint on file.   HPI [name] is a 58 y.o. female.   Pt reports she has a sore on the left side of her nose that is painful x 6 days  Took tylenol         Past Medical History:  Diagnosis Date   Arthritis    Back pain 10/30/2015   Bilateral shoulder pain 10/30/2015   COPD (chronic obstructive pulmonary disease) (HCC)    Fecal occult blood test positive 10/30/2015   Hemorrhoids 10/30/2015   Hypertension    Hypothyroidism    Large breasts 10/30/2015   43 DD   Postmenopausal 10/30/2015   Sleep apnea    pt DX but did not want to use CPAP.   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   Plantar fasciitis 12/06/2013    Past Surgical History:  Procedure Laterality Date   back ablation  03/2024   BREAST REDUCTION SURGERY Bilateral 04/17/2020   Procedure: MAMMARY REDUCTION  (BREAST);  Surgeon: Elisabeth Craig RAMAN, MD;  Location: [facility];  Service: Plastics;  Laterality: Bilateral;  2.5 hours, please   COLONOSCOPY N/A 11/12/2015   Procedure: COLONOSCOPY;  Surgeon: Margo LITTIE Haddock, MD;  Location: [facility];  Service: Endoscopy;  Laterality: N/A;  8:30 AM   JOINT REPLACEMENT     KNEE ARTHROSCOPY WITH MEDIAL MENISECTOMY Left 05/04/2015   Procedure: KNEE ARTHROSCOPY WITH MEDIAL AND  LATERAL MENISECTOMY;  Surgeon: Taft FORBES Minerva, MD;  Location: [facility];  Service: Orthopedics;  Laterality: Left;   NASAL SINUS SURGERY     ROTATOR CUFF REPAIR Right    SHOULDER ARTHROSCOPY Right 10/12/2018   right rotator cuff repair    SHOULDER SURGERY Right 10/12/2018   rotator cuff repair    TOTAL KNEE ARTHROPLASTY Left 01/29/2017   Procedure: TOTAL KNEE ARTHROPLASTY;  Surgeon: Minerva Taft FORBES, MD;  Location: [facility];  Service: Orthopedics;  Laterality: Left;    OB History     Gravida  0   Para  0   Term  0   Preterm  0   AB  0   Living  0      SAB  0   IAB  0   Ectopic  0   Multiple  0   Live Births               Home Medications    Prior to Admission medications  Medication Sig Start Date End Date Taking? Authorizing Provider  doxycycline  (VIBRAMYCIN ) 100 MG capsule Take 1 capsule (100 mg total) by mouth 2 (two) times daily. 09/16/24  Yes Stuart Vernell Almarie, PA-C  mupirocin  ointment (BACTROBAN ) 2 % Apply 1 Application topically 2 (two) times daily. 09/16/24  Yes Stuart Vernell Almarie, PA-C  Acetaminophen  (TYLENOL  PO) Take by mouth as  needed.     [provider]  albuterol  (VENTOLIN  HFA) 108 (90 Base) MCG/ACT inhaler Inhale 2 puffs into the lungs every 6 (six) hours as needed for wheezing or shortness of breath. 09/28/22   Leath-Warren, Etta PARAS, NP  azelastine  (ASTELIN ) 0.1 % nasal spray Place 1 spray into both nostrils 2 (two) times daily. Use in each nostril as directed 07/08/24   Stuart Vernell Norris, PA-C  budesonide -formoterol  (SYMBICORT ) 80-4.5 MCG/ACT inhaler Inhale 2 puffs into the lungs 2 (two) times daily. 06/09/23   Ricky Fines, MD  cholecalciferol (VITAMIN D3) 25 MCG (1000 UNIT) tablet Take 1,000 Units by mouth daily.    [provider]  diclofenac  (VOLTAREN ) 75 MG EC tablet Take 1 tablet (75 mg total) by mouth 2 (two) times daily. 02/03/23   Marcelino Nurse, MD  famotidine  (PEPCID ) 20 MG tablet One after supper  08/06/23   Wert, Michael B, MD  levothyroxine  (SYNTHROID ) 25 MCG tablet Take 1 tablet (25 mcg total) by mouth daily before breakfast. 05/09/24   Grooms, Loretto, PA-C  olmesartan -hydrochlorothiazide  (BENICAR  HCT) 40-25 MG tablet Take 1 tablet by mouth daily. Patient has not taken in about 3 weeks because pills were lost and she can't get them refilled. 05/10/23   [provider]  pantoprazole  (PROTONIX ) 40 MG tablet Take 1 tablet (40 mg total) by mouth daily. 07/14/24   Grooms, Courtney, PA-C  Potassium Gluconate 550 MG TABS Take 550 mg by mouth daily.    [provider]  promethazine -dextromethorphan  (PROMETHAZINE -DM) 6.25-15 MG/5ML syrup Take 5 mLs by mouth 4 (four) times daily as needed. 07/08/24   Stuart Vernell Norris, PA-C  rosuvastatin  (CRESTOR ) 5 MG tablet Take 1 tablet (5 mg total) by mouth daily. 02/03/24   Grooms, Leola, PA-C    Family History Family History  Problem Relation Age of Onset   Lupus Maternal Grandmother    Heart disease Maternal Grandmother    Heart disease Maternal Grandfather    Hypertension Maternal Grandfather    Cancer Father        brain tumor   Alzheimer's disease Mother    Diabetes Brother    Other Brother        Legionnaires disease   Heart disease Brother    Heart attack Brother    Cancer Brother        melanoma   Breast cancer Maternal Aunt        unsure of age    Breast cancer Cousin     Social History Social History[1]   Allergies   Penicillins   Review of Systems Review of Systems PER HPI  Physical Exam Triage Vital Signs ED Triage Vitals  Encounter Vitals Group     BP 09/16/24 1449 113/71     Girls Systolic BP Percentile --      Girls Diastolic BP Percentile --      Boys Systolic BP Percentile --      Boys Diastolic BP Percentile --      Pulse Rate 09/16/24 1449 81     Resp 09/16/24 1449 18     Temp 09/16/24 1449 98.6 F (37 C)     Temp Source 09/16/24 1449 Oral     SpO2 09/16/24 1449 97 %     Weight  --      Height --      Head Circumference --      Peak Flow --      Pain Score 09/16/24 1450 5     Pain Loc --  Pain Education --      Exclude from Growth Chart --    No data found.  Updated Vital Signs BP 113/71 (BP Location: Right Arm)   Pulse 81   Temp 98.6 F (37 C) (Oral)   Resp 18   LMP 04/14/2011   SpO2 97%   Visual Acuity Right Eye Distance:   Left Eye Distance:   Bilateral Distance:    Right Eye Near:   Left Eye Near:    Bilateral Near:     Physical Exam Vitals and nursing note reviewed.  Constitutional:      Appearance: Normal appearance. She is not ill-appearing.  HENT:     Head: Atraumatic.     Nose:     Comments: Left nasal fold erythematous, edematous, tender to palpation.  Crusted lesion present to the distal left nare Eyes:     Extraocular Movements: Extraocular movements intact.     Conjunctiva/sclera: Conjunctivae normal.  Cardiovascular:     Rate and Rhythm: Normal rate.  Pulmonary:     Effort: Pulmonary effort is normal.  Musculoskeletal:        General: Normal range of motion.     Cervical back: Normal range of motion and neck supple.  Skin:    General: Skin is warm and dry.  Neurological:     Mental Status: She is alert and oriented to person, place, and time.  Psychiatric:        Mood and Affect: Mood normal.        Thought Content: Thought content normal.        Judgment: Judgment normal.      UC Treatments / Results  Labs (all labs ordered are listed, but only abnormal results are displayed) Labs Reviewed - No data to display  EKG   Radiology No results found.  Procedures Procedures (including critical care time)  Medications Ordered in UC Medications - No data to display  Initial Impression / Assessment and Plan / UC Course  I have reviewed the triage vital signs and the nursing notes.  Pertinent labs & imaging results that were available during my care of the patient were reviewed by me and considered in my  medical decision making (see chart for details).     Treat with doxycycline , mupirocin , warm compresses, over-the-counter pain relievers as needed.  Return for worsening or unresolving symptoms.  Final Clinical Impressions(s) / UC Diagnoses   Final diagnoses:  Cellulitis of nasal tip   Discharge Instructions   None    ED Prescriptions     Medication Sig Dispense Auth. Provider   doxycycline  (VIBRAMYCIN ) 100 MG capsule Take 1 capsule (100 mg total) by mouth 2 (two) times daily. 14 capsule Stuart Vernell Norris, PA-C   mupirocin  ointment (BACTROBAN ) 2 % Apply 1 Application topically 2 (two) times daily. 60 g Stuart Vernell Norris, [place]      PDMP not reviewed this encounter.    [1]  Social History Tobacco Use   Smoking status: Never   Smokeless tobacco: Never  Vaping Use   Vaping status: Never Used  Substance Use Topics   Alcohol use: Yes    Comment: rare   Drug use: No     Stuart Vernell Norris, PA-C 09/16/24 1627  "

## 2024-09-19 ENCOUNTER — Other Ambulatory Visit: Payer: Self-pay | Admitting: Physician Assistant

## 2024-09-19 NOTE — Telephone Encounter (Signed)
 Copied from CRM [id number]. Topic: Clinical - Medication Refill >> Sep 19, 2024  8:37 AM Tobias L wrote: Medication: olmesartan -hydrochlorothiazide  (BENICAR  HCT) 40-25 MG tablet Patient requesting Charmaine eke prescription and send to pharmacy below.  Has the patient contacted their pharmacy? Yes   This is the patient's preferred pharmacy:  [facility] [address], [place] - [address] [place] [address] Phone: [phone] Fax: [phone]  Is this the correct pharmacy for this prescription? Yes   Has the prescription been filled recently? No  Is the patient out of the medication? Yes  Has the patient been seen for an appointment in the last year OR does the patient have an upcoming appointment? Yes  Can we respond through MyChart? Yes  Agent: Please be advised that Rx refills may take up to 3 business days. We ask that you follow-up with your pharmacy.

## 2024-09-19 NOTE — Telephone Encounter (Signed)
 Patient requesting [name] eke prescription and send to pharmacy below.

## 2024-09-23 ENCOUNTER — Encounter: Payer: Self-pay | Admitting: Physician Assistant

## 2024-09-23 ENCOUNTER — Ambulatory Visit: Admitting: Physician Assistant

## 2024-09-23 VITALS — BP 142/70 | HR 67 | Temp 98.2°F | Ht 63.0 in | Wt 223.2 lb

## 2024-09-23 DIAGNOSIS — E782 Mixed hyperlipidemia: Secondary | ICD-10-CM | POA: Diagnosis not present

## 2024-09-23 DIAGNOSIS — I10 Essential (primary) hypertension: Secondary | ICD-10-CM | POA: Diagnosis not present

## 2024-09-23 DIAGNOSIS — E039 Hypothyroidism, unspecified: Secondary | ICD-10-CM

## 2024-09-23 DIAGNOSIS — J4521 Mild intermittent asthma with (acute) exacerbation: Secondary | ICD-10-CM

## 2024-09-23 MED ORDER — OLMESARTAN MEDOXOMIL-HCTZ 40-25 MG PO TABS: 1.0000 | ORAL_TABLET | Freq: Every day | ORAL | 1 refills | Status: AC

## 2024-09-23 MED ORDER — ROSUVASTATIN CALCIUM 5 MG PO TABS: 5.0000 mg | ORAL_TABLET | Freq: Every day | ORAL | 1 refills | Status: AC

## 2024-09-23 MED ORDER — ALBUTEROL SULFATE HFA 108 (90 BASE) MCG/ACT IN AERS: 2.0000 | INHALATION_SPRAY | Freq: Four times a day (QID) | RESPIRATORY_TRACT | 0 refills | Status: DC | PRN

## 2024-09-23 NOTE — Assessment & Plan Note (Signed)
 Stable. Repeating thyroid  function. Continue current treatment plan pending lab results.

## 2024-09-23 NOTE — Assessment & Plan Note (Signed)
 Symptoms well-controlled with albuterol  inhaler. Has not been using Symbicort  due to lack of refills. Breathing stable.  - Continue albuterol  inhaler as needed. - Okay to discontinue Symbicort . Follow up if breathing or cough worsens to reinitiate maintenance inhaler.

## 2024-09-23 NOTE — Assessment & Plan Note (Signed)
 Stable. Continue with current management without changes. Discussed healthy diet and lifestyle.

## 2024-09-23 NOTE — Progress Notes (Signed)
 "  Established Patient Office Visit  Subjective   Patient ID: [name], female    DOB: 07-07-1966  Age: 59 y.o. MRN: [id number]  Chief Complaint  Patient presents with   Medication Refill    Needs refill on BP meds PT states has not taken symbicort  in about a month    Discussed the use of AI scribe software for clinical note transcription with the patient, who gave verbal consent to proceed.  History of Present Illness [name] is a 59 year old female with hypertension and asthma who presents for medication refills.  She stopped using her Symbicort  about 3 to 4 weeks ago and has been relying on her rescue inhaler. She had wheezing 2 to 3 days ago that resolved after using the rescue inhaler. She notes her pulmonologist told her she does not have COPD. She feels symptoms are well controlled without Symbicort  inhaler.   She has noticed higher than usual blood pressure and has not taken her antihypertensive medication since Monday due to lack of refills. She has a headache that she thinks may be related to the elevated blood pressure. Denies chest pain or shortness of breath.   She recently had cellulitis on her nose and was treated at urgent care with antibiotics. The area is now dry and she can blow her nose without pain.  She notes difficulty losing weight. She had blood work done earlier this year and is interested in checking thyroid  levels to confirm her current dose is appropriate.   Review of Systems  Constitutional:  Negative for activity change, appetite change, fatigue and fever.  Eyes:  Negative for visual disturbance.  Respiratory:  Negative for cough and shortness of breath.   Cardiovascular:  Negative for chest pain.  Neurological:  Positive for headaches. Negative for light-headedness.       Objective:     BP (!) 142/70 (BP Location: Left Arm, Cuff Size: Large)   Pulse 67   Temp 98.2 F (36.8 C)   Ht 5' 3 (1.6 m)   Wt 223 lb 3.2 oz  (101.2 kg)   LMP 04/14/2011   SpO2 100%   BMI 39.54 kg/m    Physical Exam Constitutional:      General: She is not in acute distress.    Appearance: Normal appearance. She is obese. She is not ill-appearing.  HENT:     Head: Normocephalic and atraumatic.     Mouth/Throat:     Mouth: Mucous membranes are moist.     Pharynx: Oropharynx is clear.  Eyes:     Extraocular Movements: Extraocular movements intact.     Conjunctiva/sclera: Conjunctivae normal.  Cardiovascular:     Rate and Rhythm: Normal rate and regular rhythm.     Heart sounds: Normal heart sounds. No murmur heard. Pulmonary:     Effort: Pulmonary effort is normal.     Breath sounds: No wheezing, rhonchi or rales.  Musculoskeletal:     Right lower leg: No edema.     Left lower leg: No edema.  Skin:    General: Skin is warm and dry.  Neurological:     General: No focal deficit present.     Mental Status: She is alert and oriented to person, place, and time.  Psychiatric:        Mood and Affect: Mood normal.        Behavior: Behavior normal.     No results found for any visits on 09/23/24.  The 10-year ASCVD  risk score (Arnett DK, et al., 2019) is: 6.2%    Assessment & Plan:   Return in about 6 months (around 03/23/2025).   Primary hypertension Assessment & Plan: 146/70 Above goal, has been out of medication for approximately 1 week. Medication refilled today.  Continue current medications. No change in management. Discussed DASH diet and dietary sodium restrictions.  Increase dietary efforts and physical activity. Follow up for new chest pain, shortness of breath, headache, or visual changes.   Orders: -     Olmesartan  Medoxomil-HCTZ; Take 1 tablet by mouth daily. Patient has not taken in about 3 weeks because pills were lost and she can't get them refilled.  Dispense: 90 tablet; Refill: 1  Hypothyroidism, unspecified type Assessment & Plan: Stable. Repeating thyroid  function. Continue current  treatment plan pending lab results.   Orders: -     TSH + free T4  Mixed hyperlipidemia Assessment & Plan: Stable. Continue with current management without changes. Discussed healthy diet and lifestyle.   Orders: -     Rosuvastatin  Calcium ; Take 1 tablet (5 mg total) by mouth daily.  Dispense: 90 tablet; Refill: 1  Mild intermittent asthmatic bronchitis with acute exacerbation Assessment & Plan: Symptoms well-controlled with albuterol  inhaler. Has not been using Symbicort  due to lack of refills. Breathing stable.  - Continue albuterol  inhaler as needed. - Okay to discontinue Symbicort . Follow up if breathing or cough worsens to reinitiate maintenance inhaler.   Orders: -     Albuterol  Sulfate HFA; Inhale 2 puffs into the lungs every 6 (six) hours as needed for wheezing or shortness of breath.  Dispense: 8 g; Refill: 0    Garwood Wentzell, PA-C "

## 2024-09-23 NOTE — Assessment & Plan Note (Signed)
 146/70 Above goal, has been out of medication for approximately 1 week. Medication refilled today.  Continue current medications. No change in management. Discussed DASH diet and dietary sodium restrictions.  Increase dietary efforts and physical activity. Follow up for new chest pain, shortness of breath, headache, or visual changes.

## 2024-09-24 LAB — TSH+FREE T4
Free T4: 1.23 ng/dL (ref 0.82–1.77)
TSH: 3.31 u[IU]/mL (ref 0.450–4.500)

## 2024-09-26 ENCOUNTER — Ambulatory Visit: Payer: Self-pay | Admitting: Physician Assistant

## 2024-10-14 ENCOUNTER — Other Ambulatory Visit: Payer: Self-pay | Admitting: Physician Assistant

## 2024-10-14 DIAGNOSIS — J4521 Mild intermittent asthma with (acute) exacerbation: Secondary | ICD-10-CM

## 2025-03-23 ENCOUNTER — Ambulatory Visit: Admitting: Physician Assistant
